# Patient Record
Sex: Female | Born: 1950 | Race: White | Hispanic: No | Smoking: Former smoker
Health system: Southern US, Community
[De-identification: ages and names within clinical notes are randomized; demographics above are authoritative.]

## PROBLEM LIST (undated history)

## (undated) DIAGNOSIS — E785 Hyperlipidemia, unspecified: Secondary | ICD-10-CM

## (undated) DIAGNOSIS — E119 Type 2 diabetes mellitus without complications: Secondary | ICD-10-CM

## (undated) DIAGNOSIS — I429 Cardiomyopathy, unspecified: Secondary | ICD-10-CM

## (undated) DIAGNOSIS — N200 Calculus of kidney: Secondary | ICD-10-CM

## (undated) DIAGNOSIS — I4891 Unspecified atrial fibrillation: Secondary | ICD-10-CM

## (undated) DIAGNOSIS — M1990 Unspecified osteoarthritis, unspecified site: Secondary | ICD-10-CM

## (undated) DIAGNOSIS — C801 Malignant (primary) neoplasm, unspecified: Secondary | ICD-10-CM

## (undated) DIAGNOSIS — Z87442 Personal history of urinary calculi: Secondary | ICD-10-CM

## (undated) DIAGNOSIS — I2510 Atherosclerotic heart disease of native coronary artery without angina pectoris: Secondary | ICD-10-CM

## (undated) DIAGNOSIS — I10 Essential (primary) hypertension: Secondary | ICD-10-CM

## (undated) DIAGNOSIS — I509 Heart failure, unspecified: Secondary | ICD-10-CM

## (undated) HISTORY — DX: Hyperlipidemia, unspecified: E78.5

## (undated) HISTORY — PX: BREAST BIOPSY: SHX20

## (undated) HISTORY — PX: ABDOMINAL HYSTERECTOMY: SHX81

## (undated) HISTORY — DX: Essential (primary) hypertension: I10

## (undated) HISTORY — PX: TONSILLECTOMY: SUR1361

## (undated) HISTORY — PX: JOINT REPLACEMENT: SHX530

## (undated) HISTORY — DX: Type 2 diabetes mellitus without complications: E11.9

## (undated) HISTORY — DX: Calculus of kidney: N20.0

## (undated) HISTORY — DX: Heart failure, unspecified: I50.9

## (undated) HISTORY — PX: BREAST EXCISIONAL BIOPSY: SUR124

## (undated) HISTORY — PX: APPENDECTOMY: SHX54

## (undated) HISTORY — DX: Atherosclerotic heart disease of native coronary artery without angina pectoris: I25.10

## (undated) HISTORY — PX: REPLACEMENT TOTAL KNEE BILATERAL: SUR1225

---

## 2007-11-09 ENCOUNTER — Ambulatory Visit: Payer: Self-pay | Admitting: Family Medicine

## 2008-05-13 ENCOUNTER — Ambulatory Visit: Payer: Self-pay | Admitting: Family Medicine

## 2009-04-11 ENCOUNTER — Ambulatory Visit: Payer: Self-pay | Admitting: Unknown Physician Specialty

## 2009-04-11 IMAGING — CR DG CHEST 2V
1 series · 2 of 2 positions shown · non-contrast
Comparison: none

REASON FOR EXAM: prior tobacco use
COMMENTS:

PROCEDURE:     DXR - DXR CHEST PA (OR AP) AND LATERAL  - [DATE] [DATE]
RESULT:     The lung fields are clear. The heart, mediastinal and osseous
structures reveal no significant abnormalities.

[Series 1: view not recorded · 0.17mm/px · 2 of 2 slices shown]
[im 1/2]
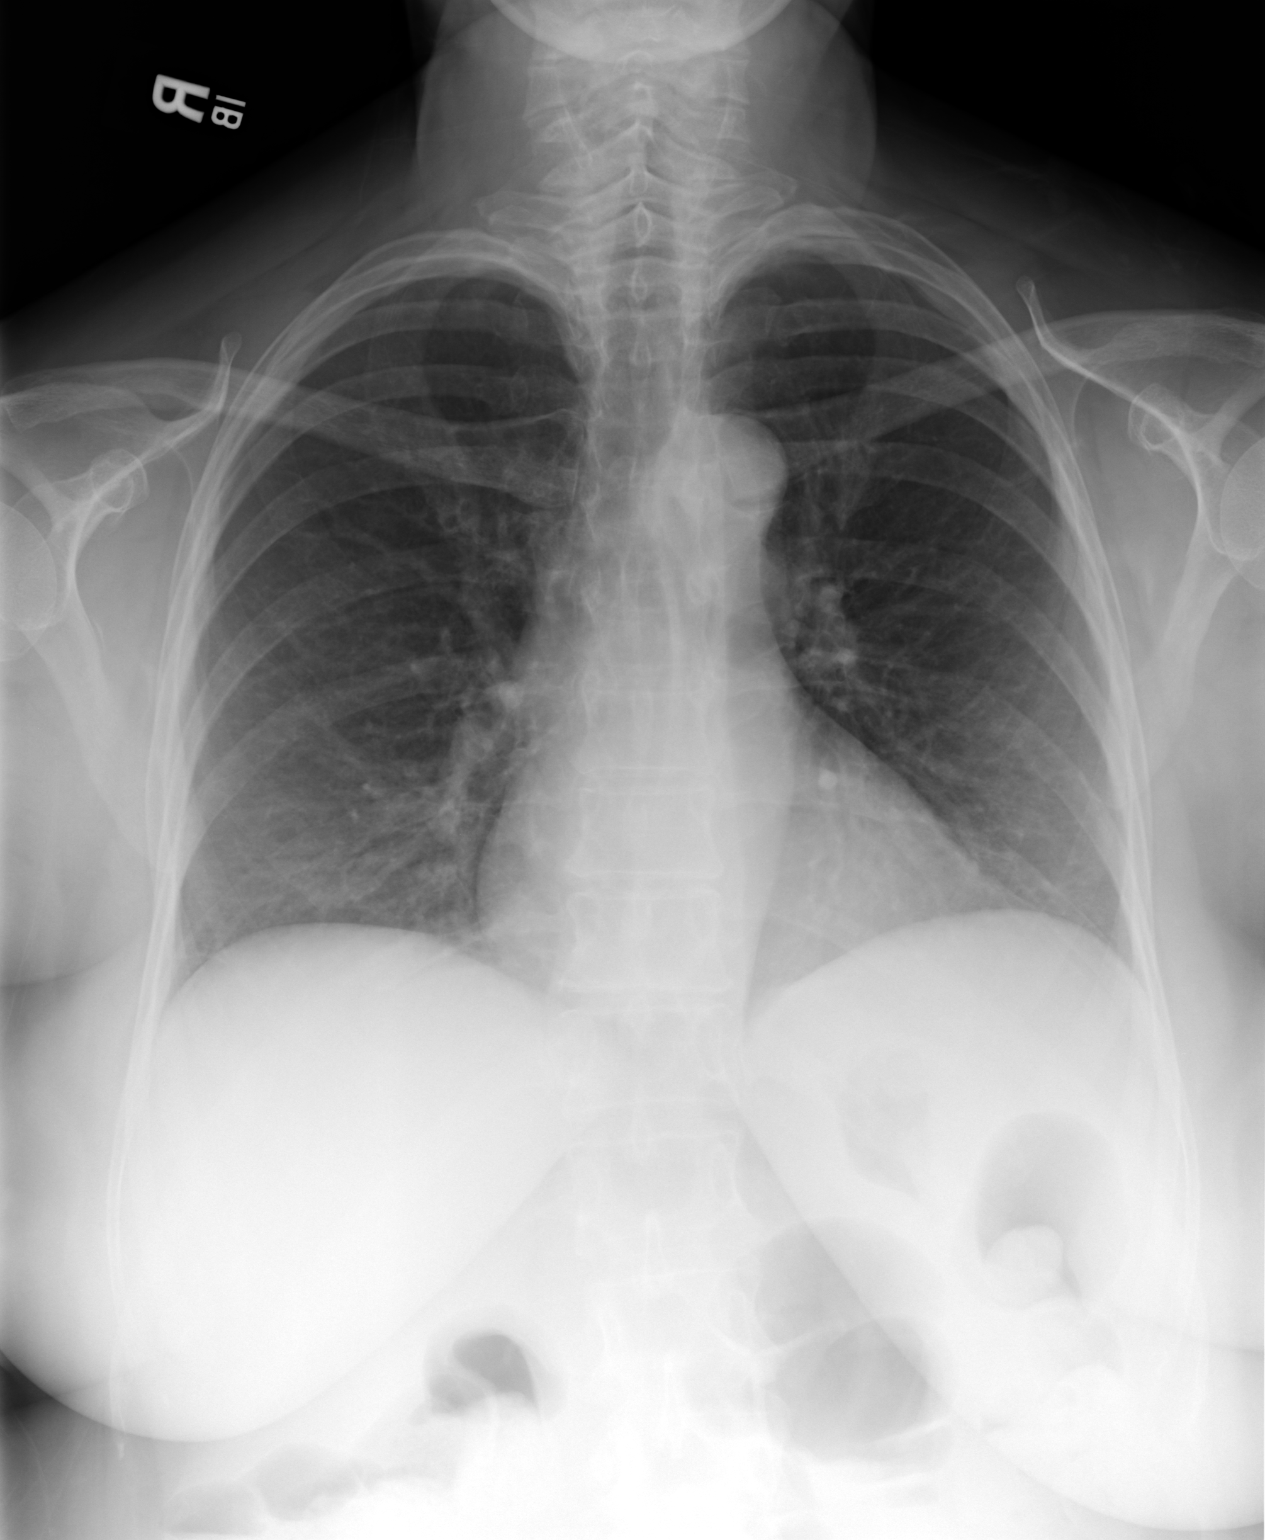
[im 2/2]
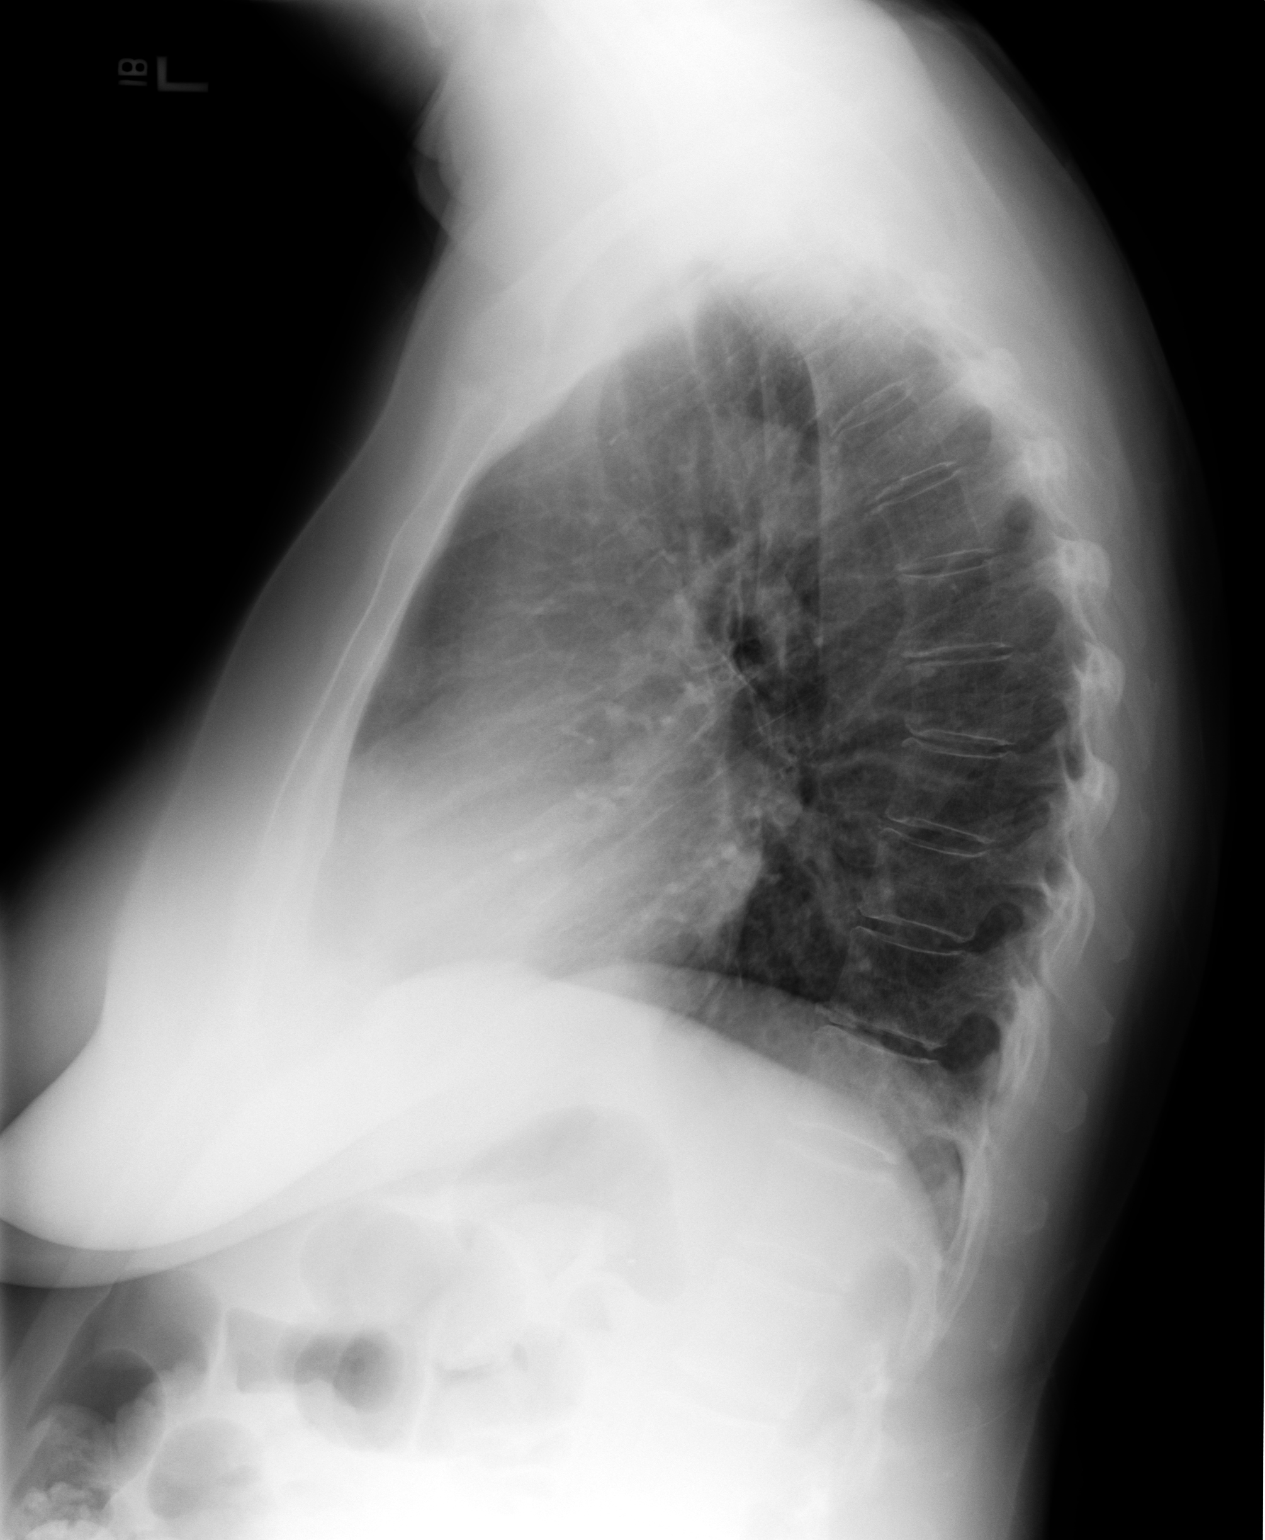

[2 of 2 positions shown; findings below may reference images not displayed]

IMPRESSION: No significant abnormalities are noted.

## 2009-04-17 ENCOUNTER — Inpatient Hospital Stay: Payer: Self-pay | Admitting: Unknown Physician Specialty

## 2009-04-17 IMAGING — CR DG KNEE 1-2V*L*
1 series · 2 of 2 positions shown · non-contrast
Comparison: none

REASON FOR EXAM: post op TKR
COMMENTS:   Bedside (portable):Y

PROCEDURE:     DXR - DXR KNEE LEFT AP AND LATERAL  - [DATE] [DATE]
RESULT:     The patient is status post total left knee replacement. No
fracture about the prosthetic components is seen. There is no dislocation at
the prosthetic knee joint. Surgical drains are noted.

[Series 1: view not recorded · 0.17mm/px · 2 of 2 slices shown]
[im 1/2]
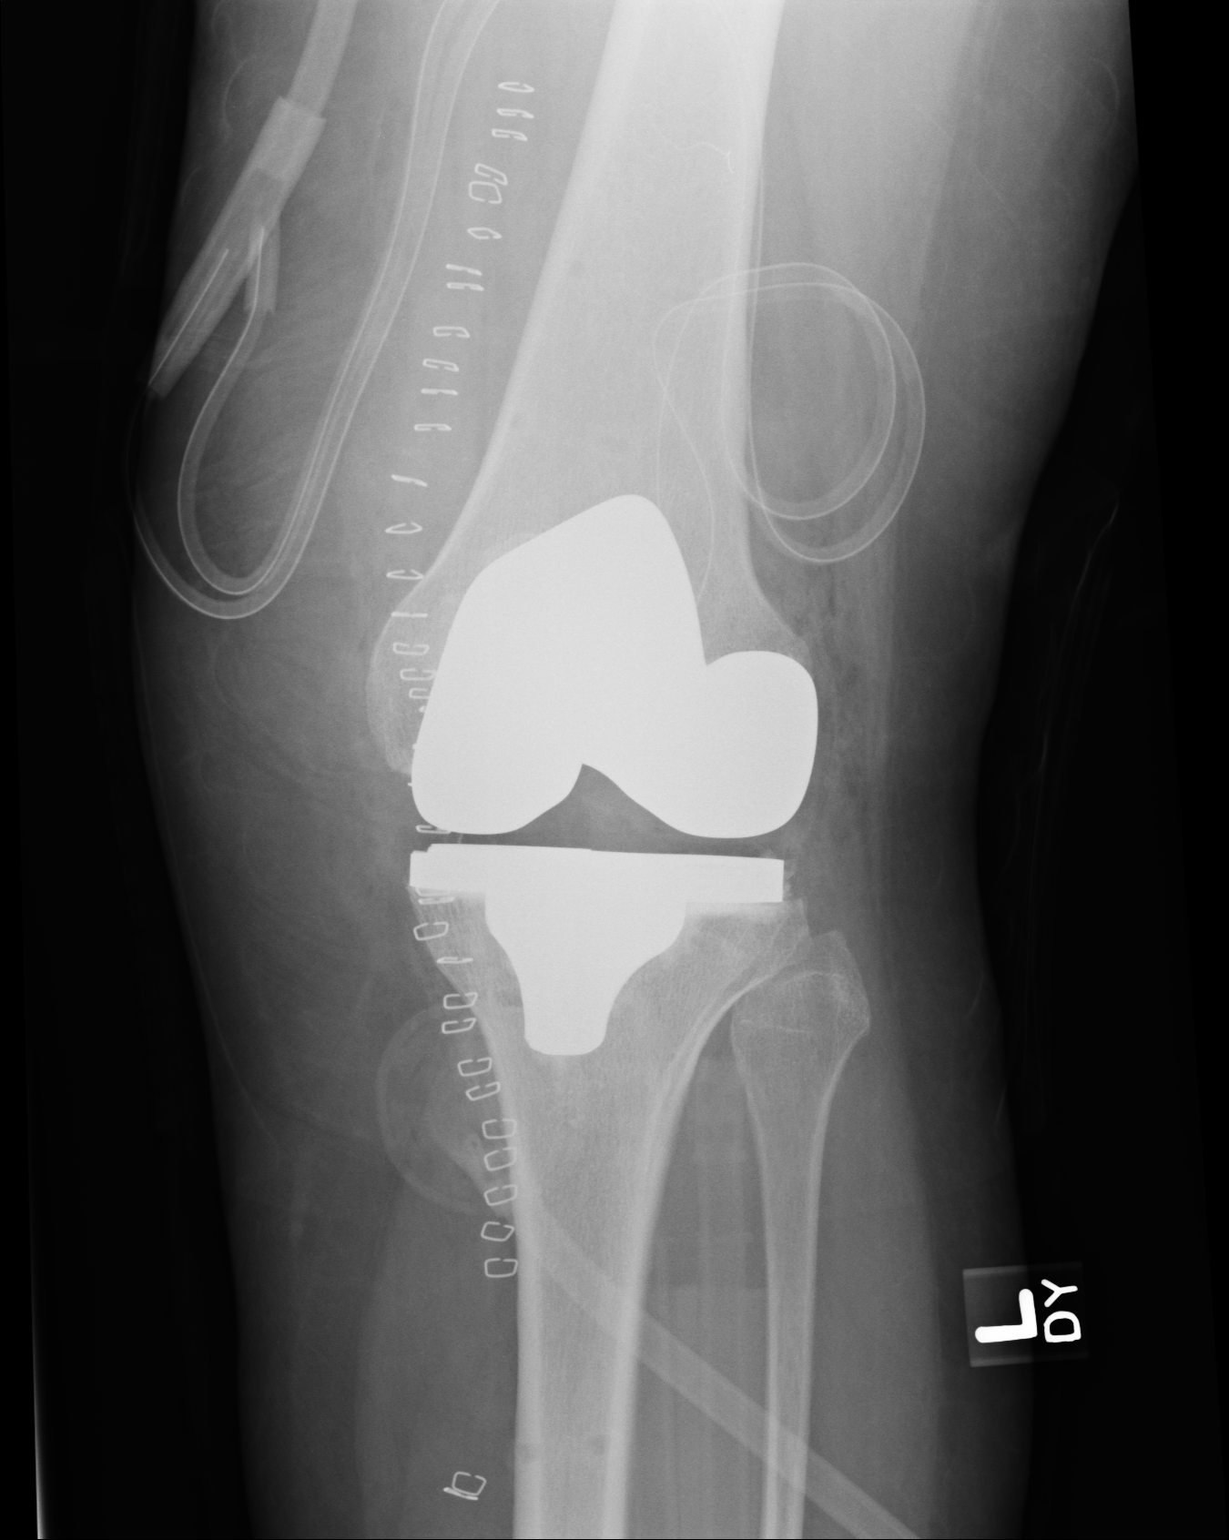
[im 2/2]
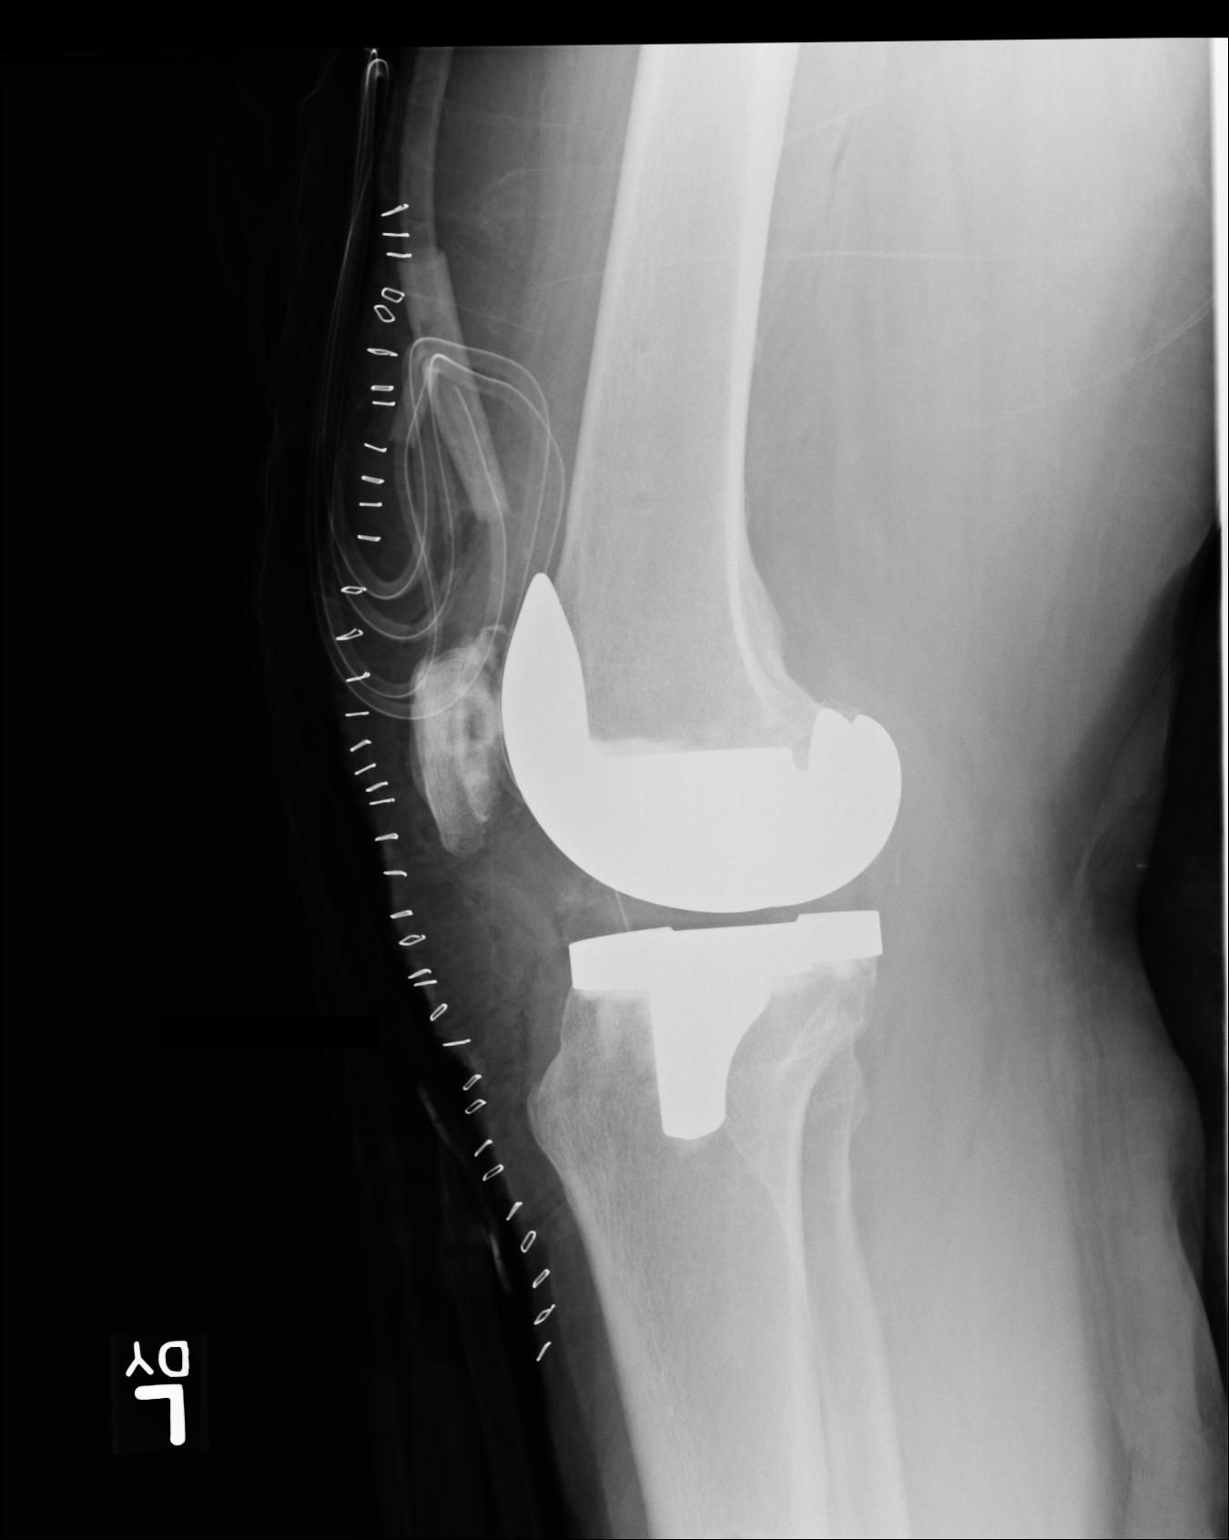

[2 of 2 positions shown; findings below may reference images not displayed]

IMPRESSION: 1.     The patient is status post left knee replacement. No abnormal
postoperative changes are identified.

## 2009-06-24 ENCOUNTER — Ambulatory Visit: Payer: Self-pay | Admitting: Family Medicine

## 2009-07-17 ENCOUNTER — Ambulatory Visit: Payer: Self-pay | Admitting: Unknown Physician Specialty

## 2009-07-17 IMAGING — CR DG CHEST 2V
1 series · 2 of 2 positions shown · non-contrast
Comparison: none

REASON FOR EXAM: previous smoker
COMMENTS:

[Series 1: view not recorded · 0.17mm/px · 2 of 2 slices shown]
[im 1/2]
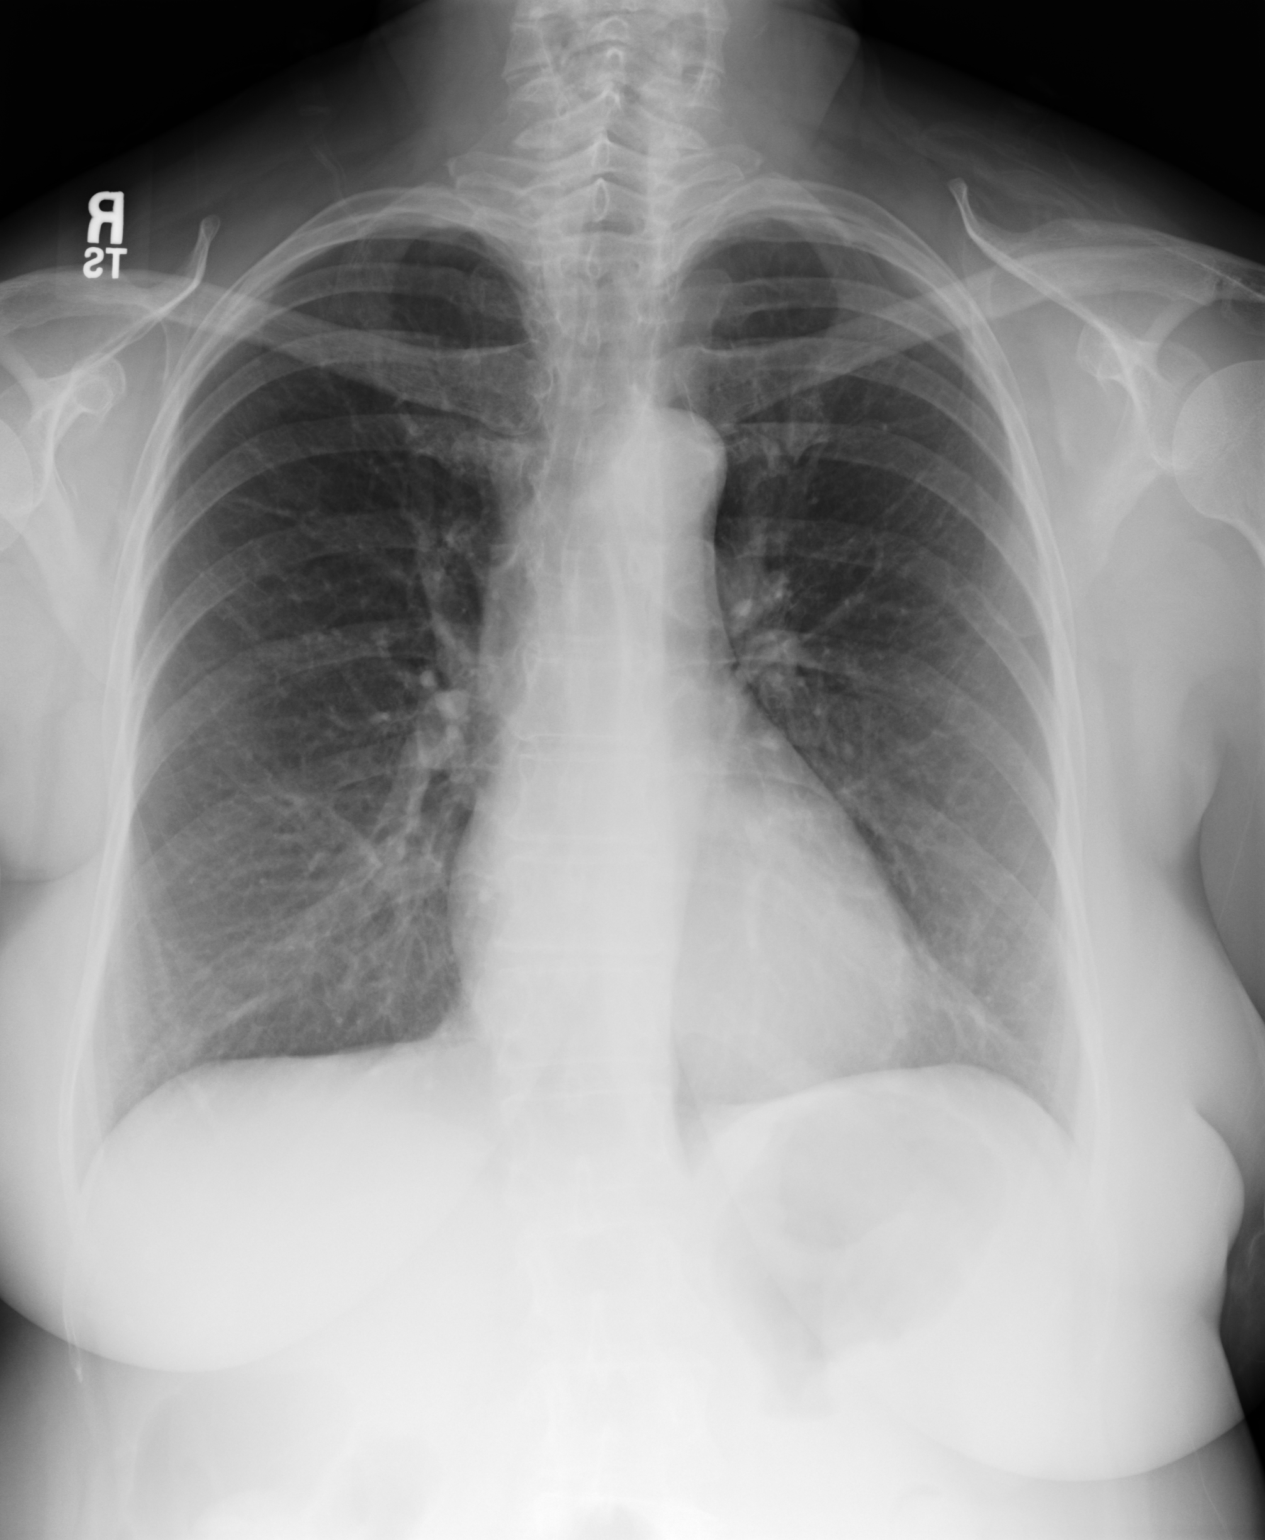
[im 2/2]
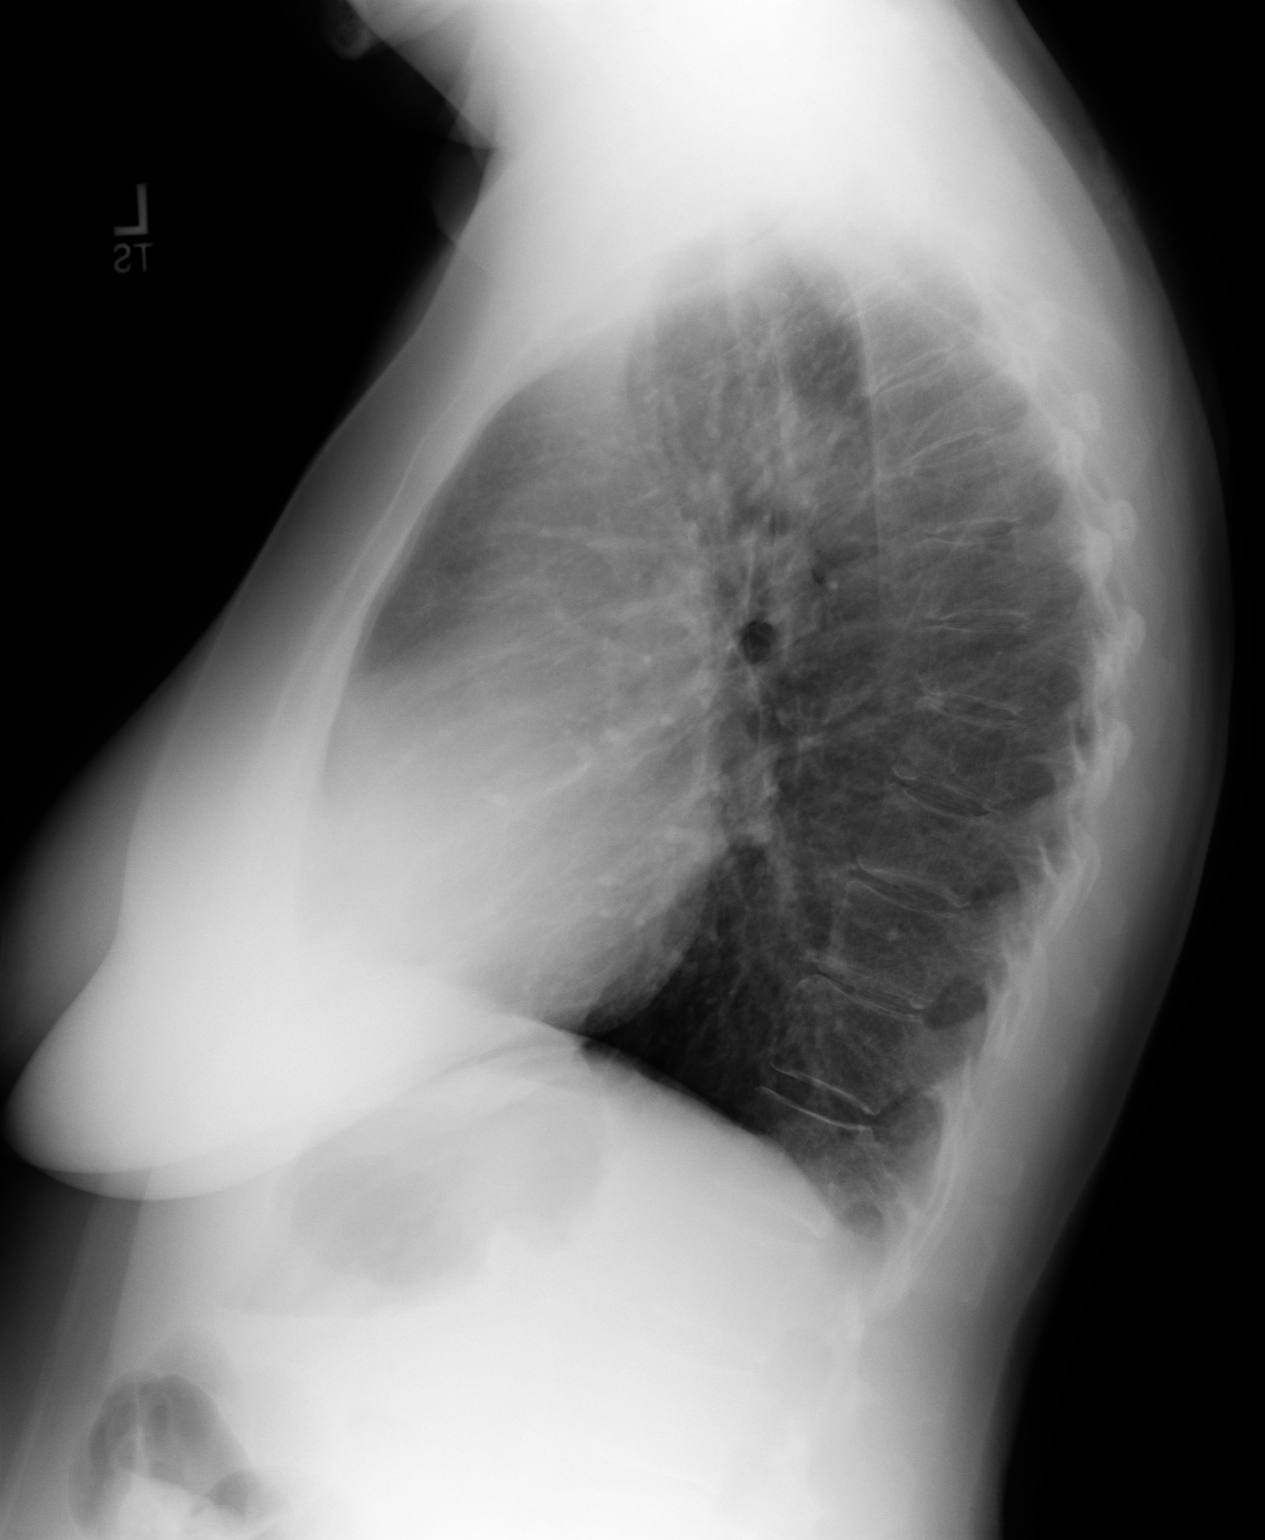

[2 of 2 positions shown; findings below may reference images not displayed]

PROCEDURE:     DXR - DXR CHEST PA (OR AP) AND LATERAL  - [DATE] [DATE]

RESULT:     Comparison made to study [DATE].

The lungs are mildly hyperinflated. The heart is normal in size. The
pulmonary vascularity is not engorged. There is no pleural effusion. No
acute abnormality of the bony thorax is seen.
IMPRESSION: There is mild hyperinflation with hemidiaphragm flattening
that may be in part voluntary or could reflect an element of underlying
COPD. There is no evidence of CHF nor pneumonia nor other acute
cardiopulmonary abnormality.

## 2009-07-22 ENCOUNTER — Inpatient Hospital Stay: Payer: Self-pay | Admitting: Unknown Physician Specialty

## 2009-07-22 IMAGING — CR DG KNEE 1-2V*R*
1 series · 2 of 2 positions shown · non-contrast
Comparison: none

REASON FOR EXAM: post op TKR
COMMENTS:   Bedside (portable):Y

[Series 1: view not recorded · 0.17mm/px · 2 of 2 slices shown]
[im 1/2]
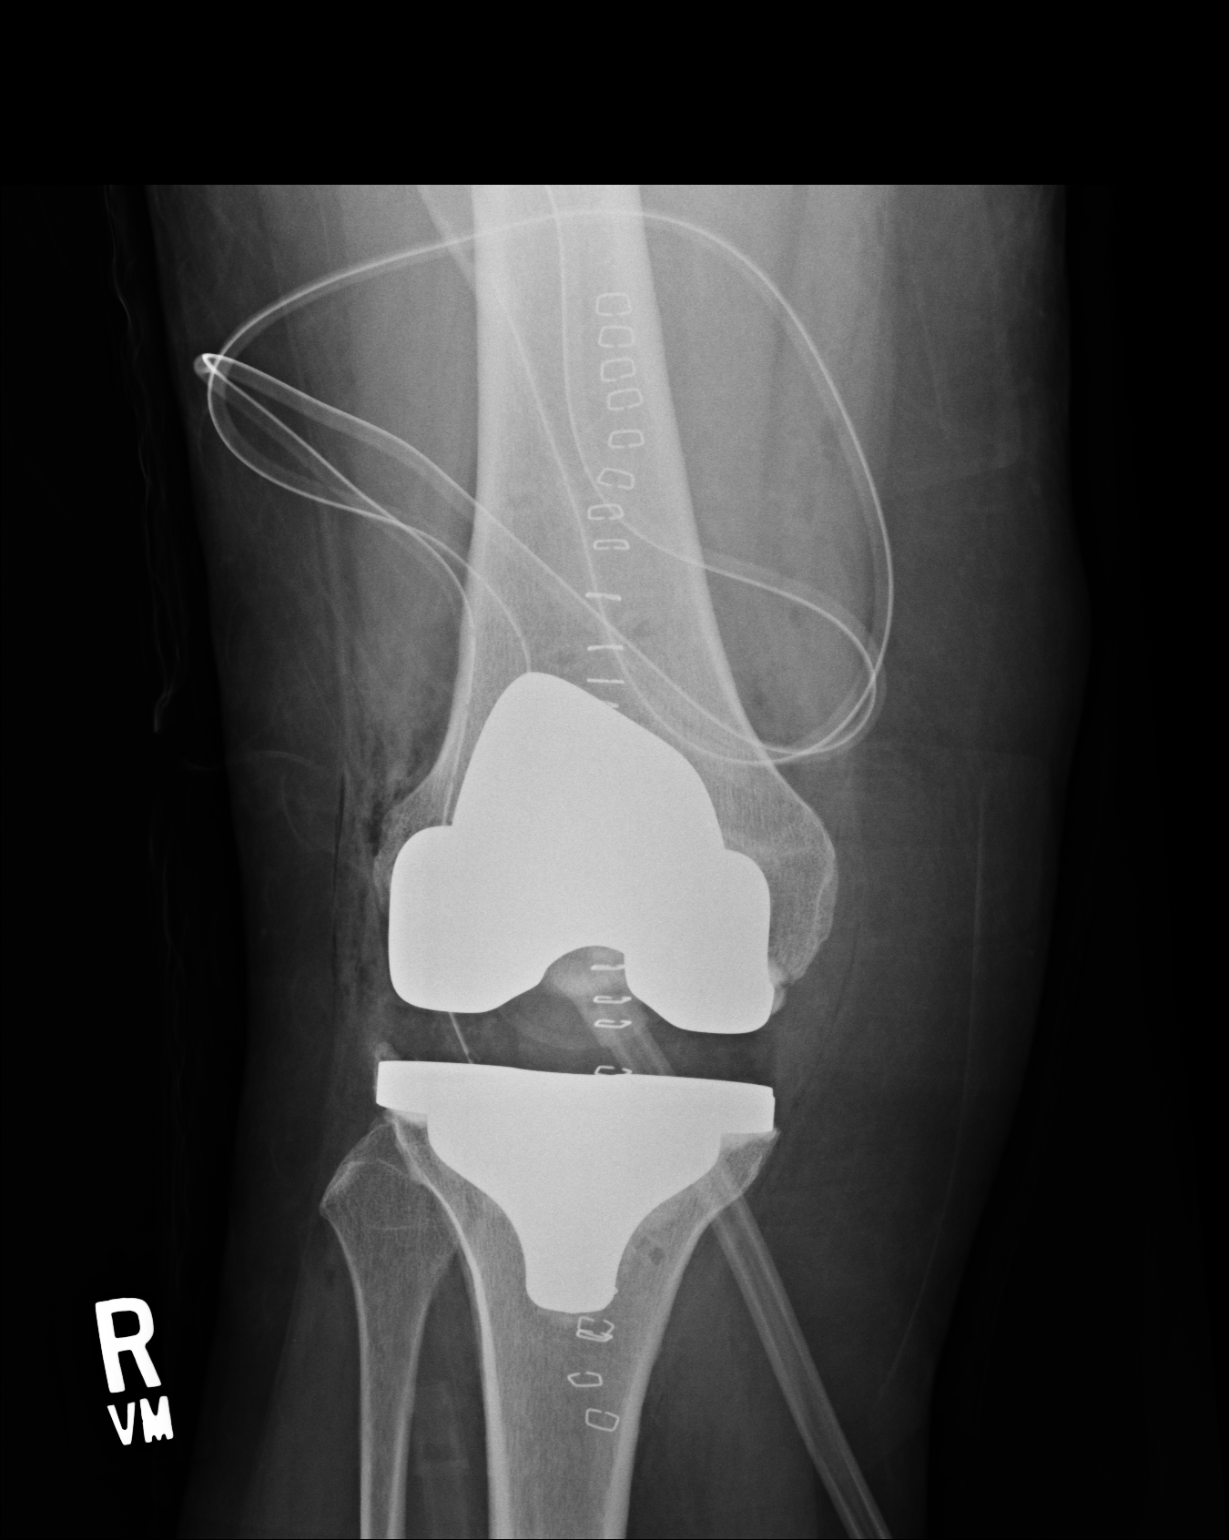
[im 2/2]
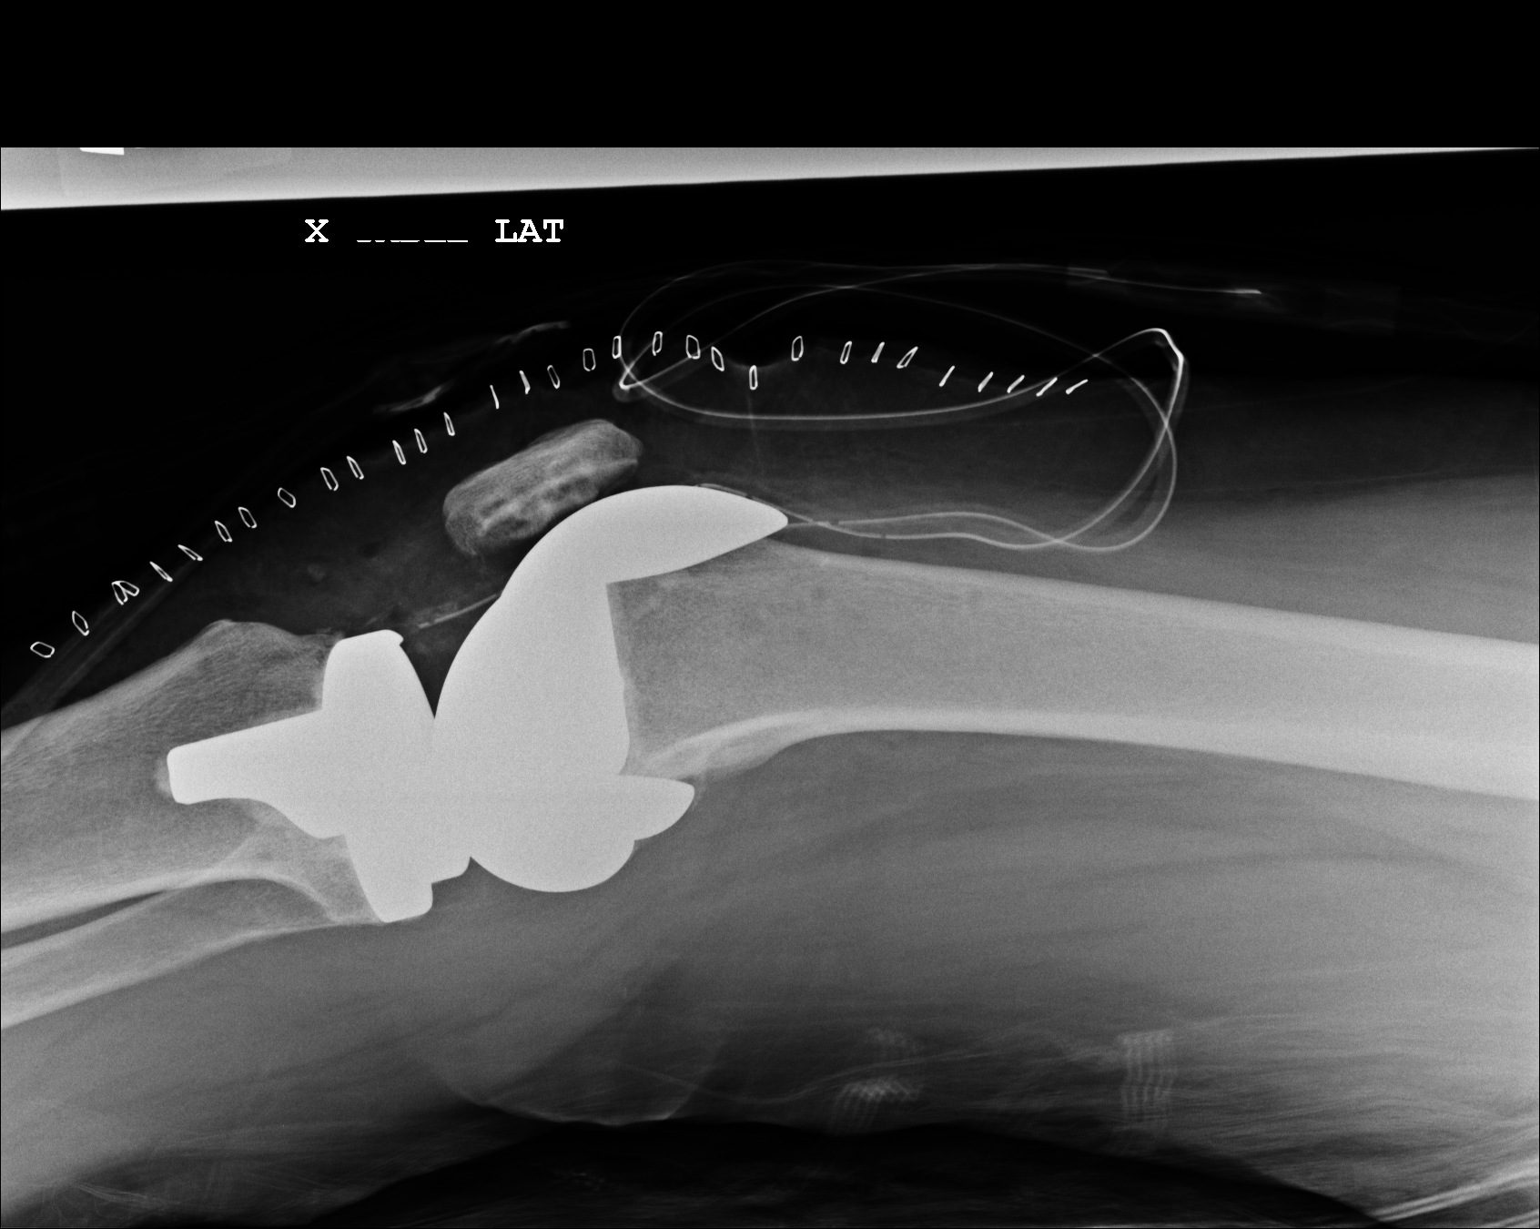

[2 of 2 positions shown; findings below may reference images not displayed]

PROCEDURE:     DXR - DXR KNEE RIGHT AP AND LATERAL  - [DATE]  [DATE]

RESULT:     Two views of the right knee were obtained.

The patient is status post right knee replacement. No fracture about the
prosthetic components is seen. There is no dislocation at the prosthetic
knee joint. Surgical drains are present anteriorly.
IMPRESSION: The patient is status post right knee replacement. No
abnormal post operative changes are identified.

## 2010-04-15 ENCOUNTER — Ambulatory Visit: Payer: Self-pay | Admitting: Family Medicine

## 2011-08-18 ENCOUNTER — Ambulatory Visit: Payer: Self-pay | Admitting: Family Medicine

## 2011-08-18 IMAGING — MG MM CAD SCREENING MAMMO
1 series · 4 of 4 positions shown · non-contrast
Comparison: none

REASON FOR EXAM: scr mammo no order
COMMENTS:

[R CC · right · 4 of 4 slices shown]
[im 1/4]
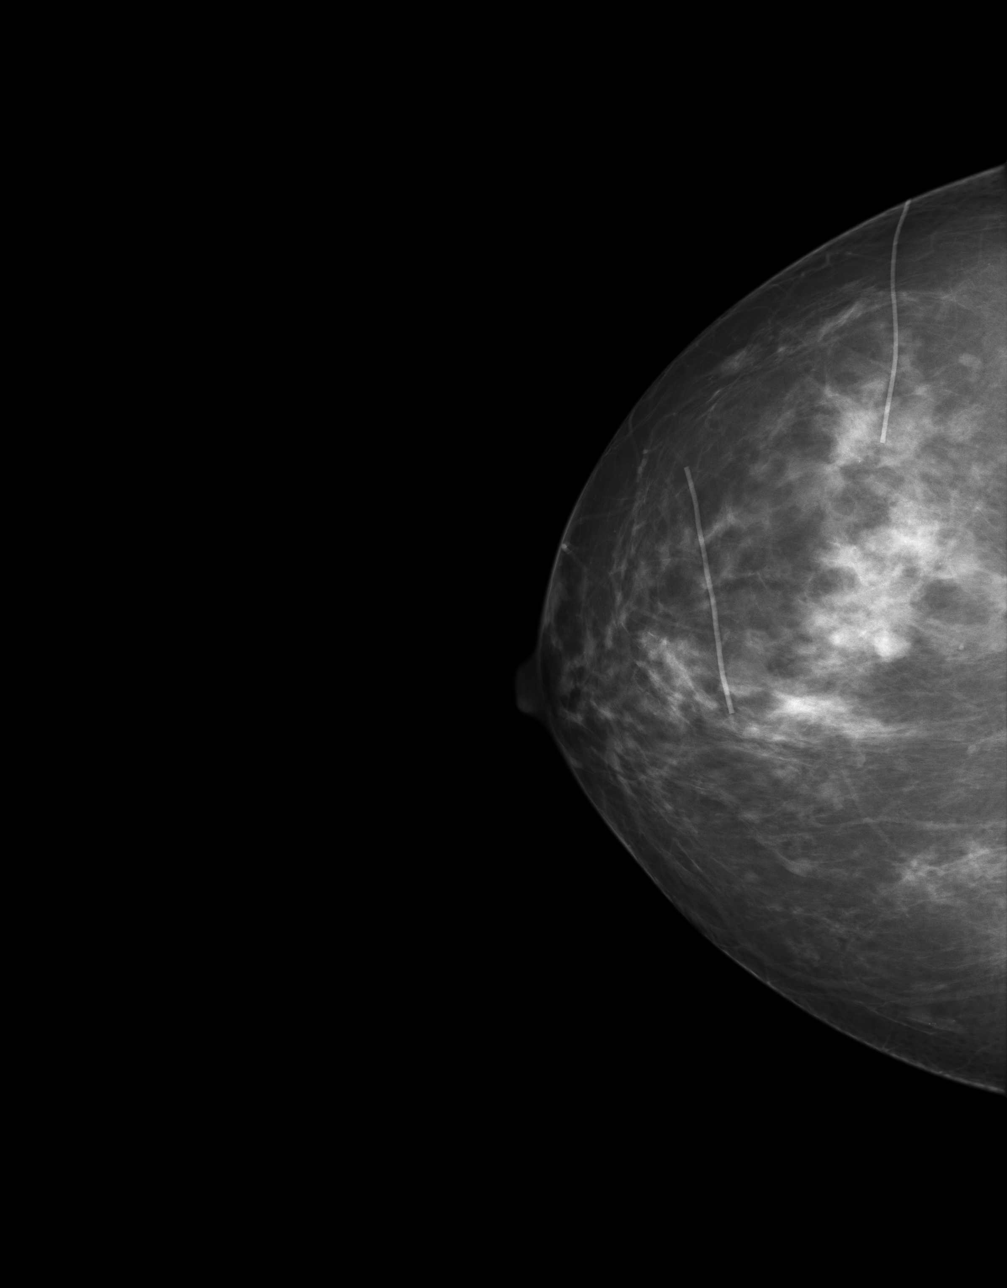
[im 2/4]
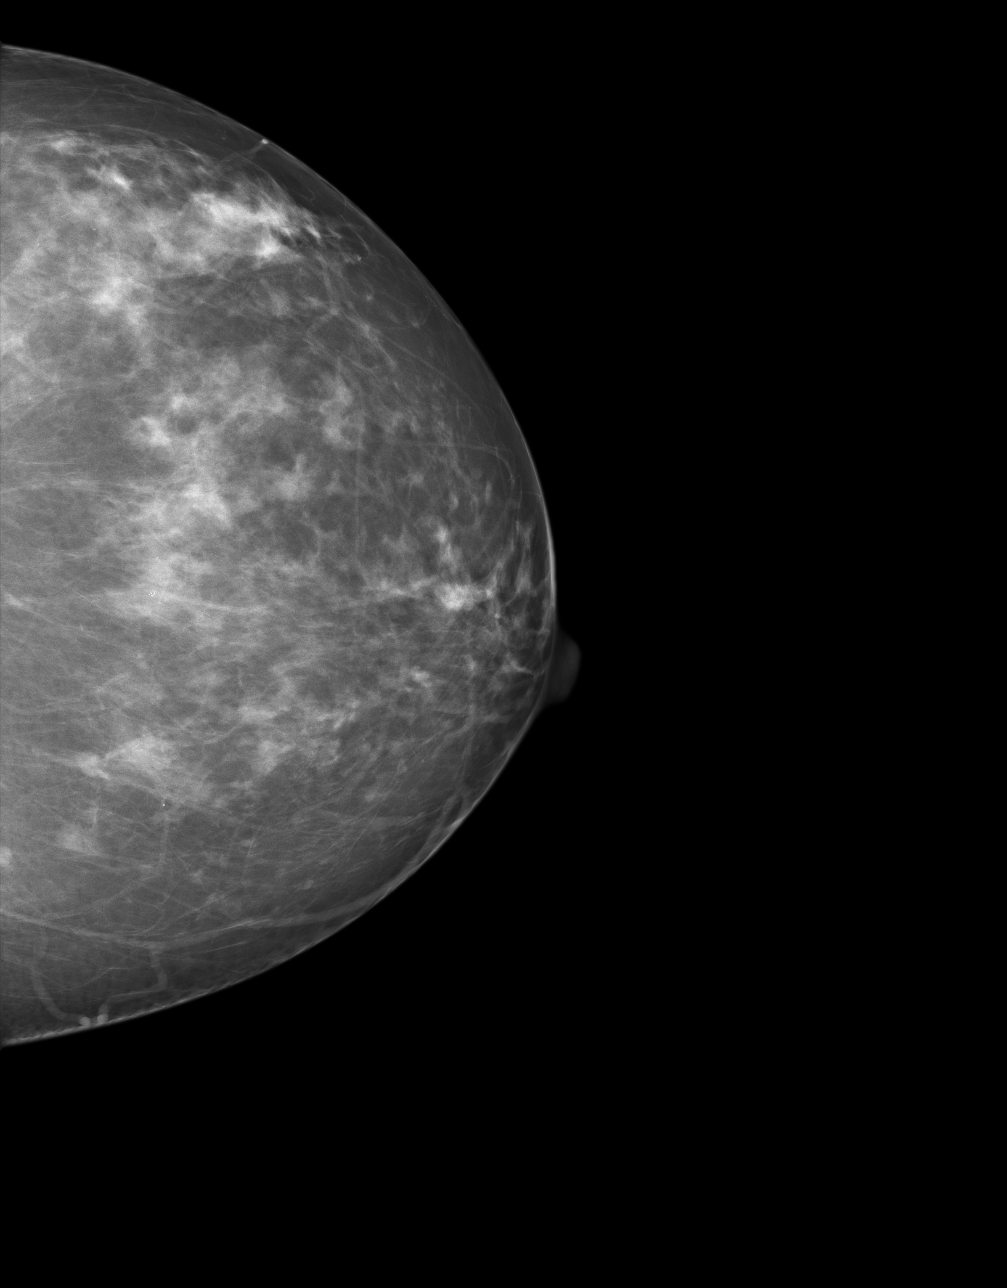
[im 3/4]
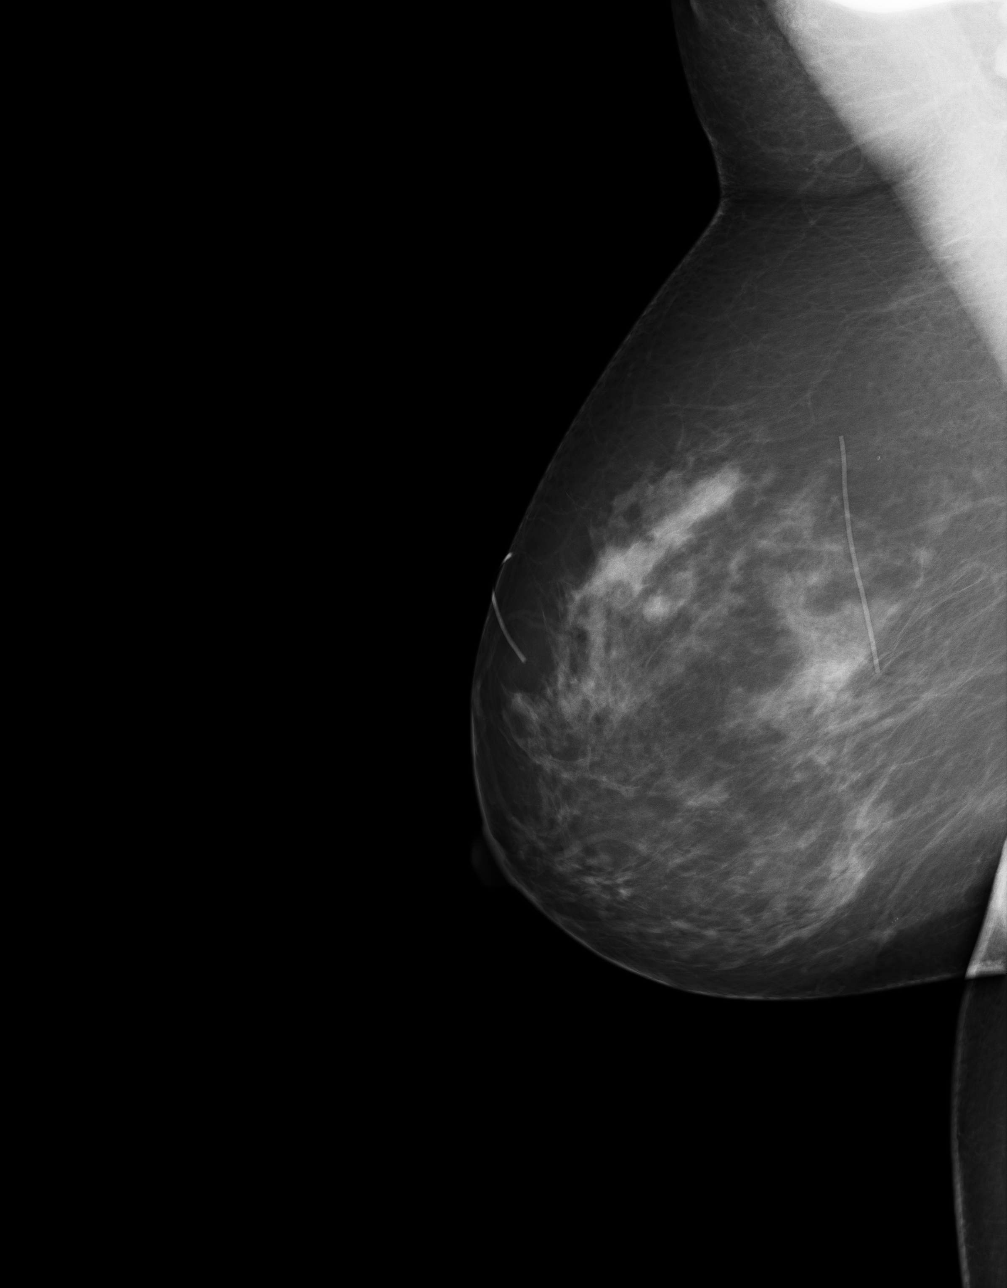
[im 4/4]
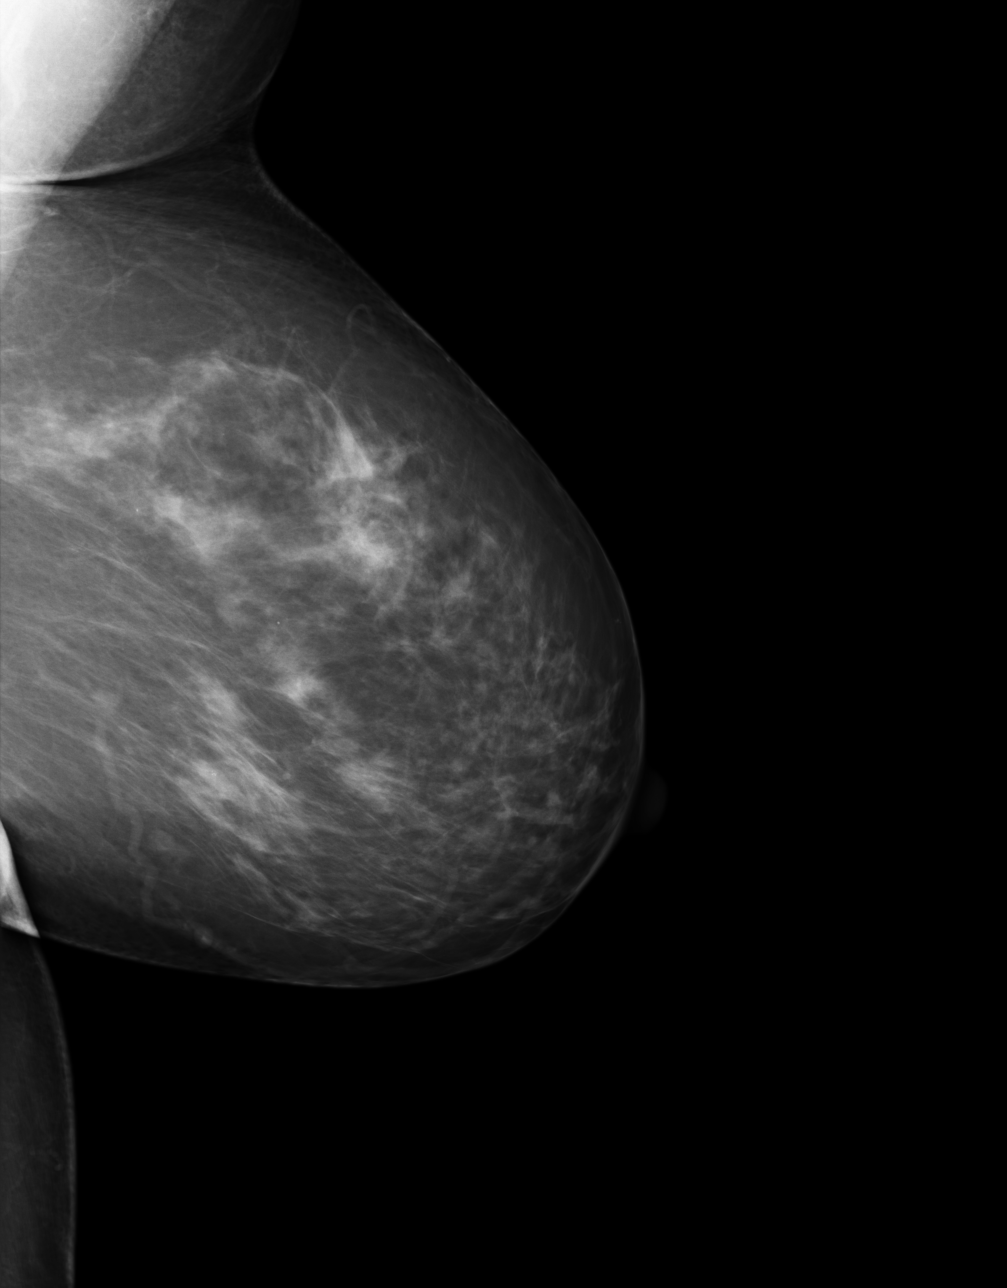

[4 of 4 positions shown; findings below may reference images not displayed]

PROCEDURE:     MAM - MAM DGTL SCRN MAM NO ORDER W/CAD  - [DATE]  [DATE]

RESULT:      Comparison is made to a study of [DATE] as well as
[DATE] and [DATE].

The breasts exhibit a heterogeously dense nodular parenchymal pattern.
Surgical scar markers have been placed on the right.  There is no dominant
mass. There are no malignant-appearing groupings of microcalcification.
IMPRESSION: 1.      The breasts are dense and nodular in character.  I do not see
findings suspicious for malignancy.
2.     BI-RADS 2:  Benign Findings.

RECOMMENTATION:   Please continue to encourage yearly mammographic follow
up.

A NEGATIVE MAMMOGRAM REPORT DOES NOT PRECLUDE BIOPSY OR OTHER EVALUATION OF
A CLINICALLY PALPABLE OR OTHERWISE SUSPICOUS MASS OR LESION.  BREAST CANCER
MAY NOT BE DETECTED BY MAMMOGRAPHY IN UP TO 10% OF CASES.

## 2012-11-01 ENCOUNTER — Ambulatory Visit: Payer: Self-pay | Admitting: Family Medicine

## 2012-11-01 IMAGING — MG MM CAD SCREENING MAMMO
1 series · 5 of 5 positions shown · non-contrast
Comparison: none

REASON FOR EXAM: scr mammo no order
COMMENTS:

PROCEDURE:     MAM - MAM DGTL SCRN MAM NO ORDER W/CAD  - [DATE]  [DATE]
RESULT:     COMPARISON:  [DATE], [DATE], [DATE], [DATE]
TECHNIQUE: Digital screening mammograms were obtained. FDA approved
computer-aided detection (CAD) for mammography was utilized for this study.
BREAST COMPOSITION: The breast composition is HETEROGENEOUSLY DENSE
(glandular tissue is 51-75%). This may decrease the sensitivity of
mammography.

[R CC · right · 5 of 5 slices shown]
[im 1/5]
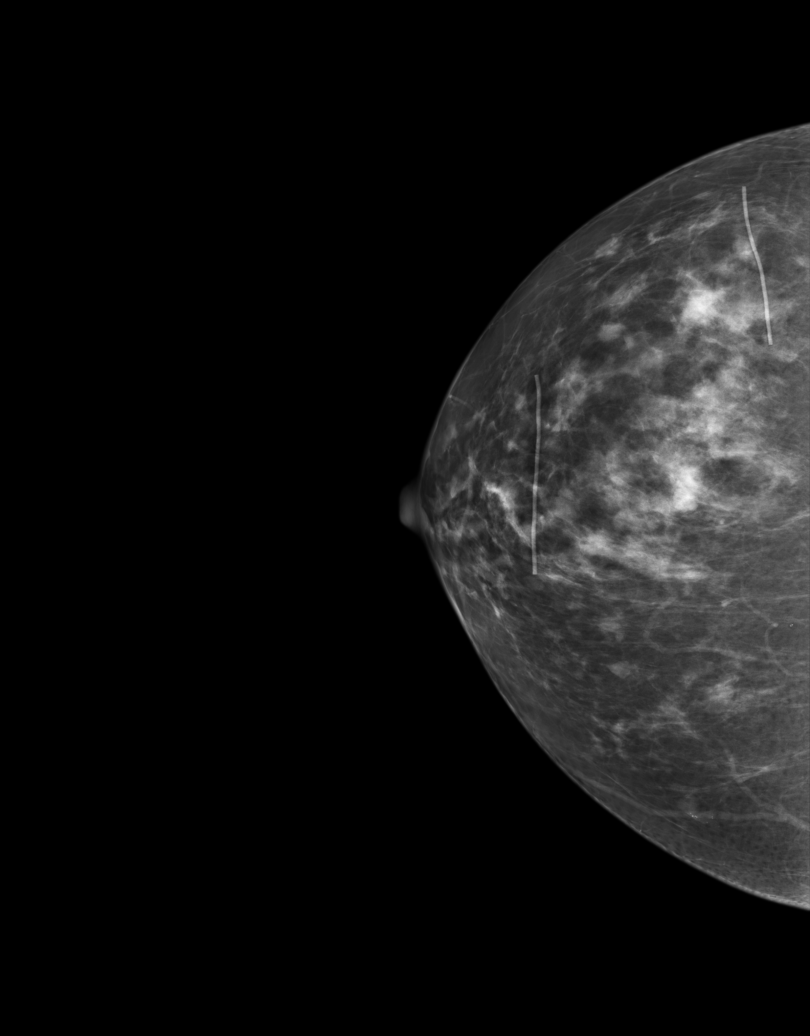
[im 2/5]
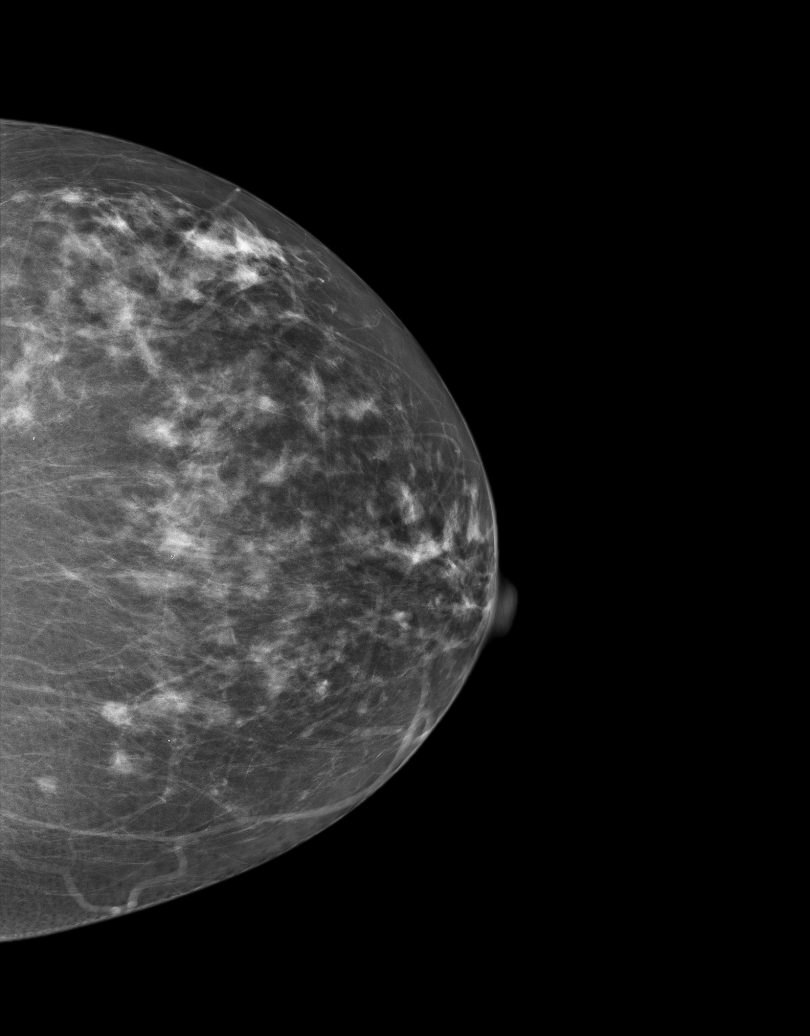
[im 3/5]
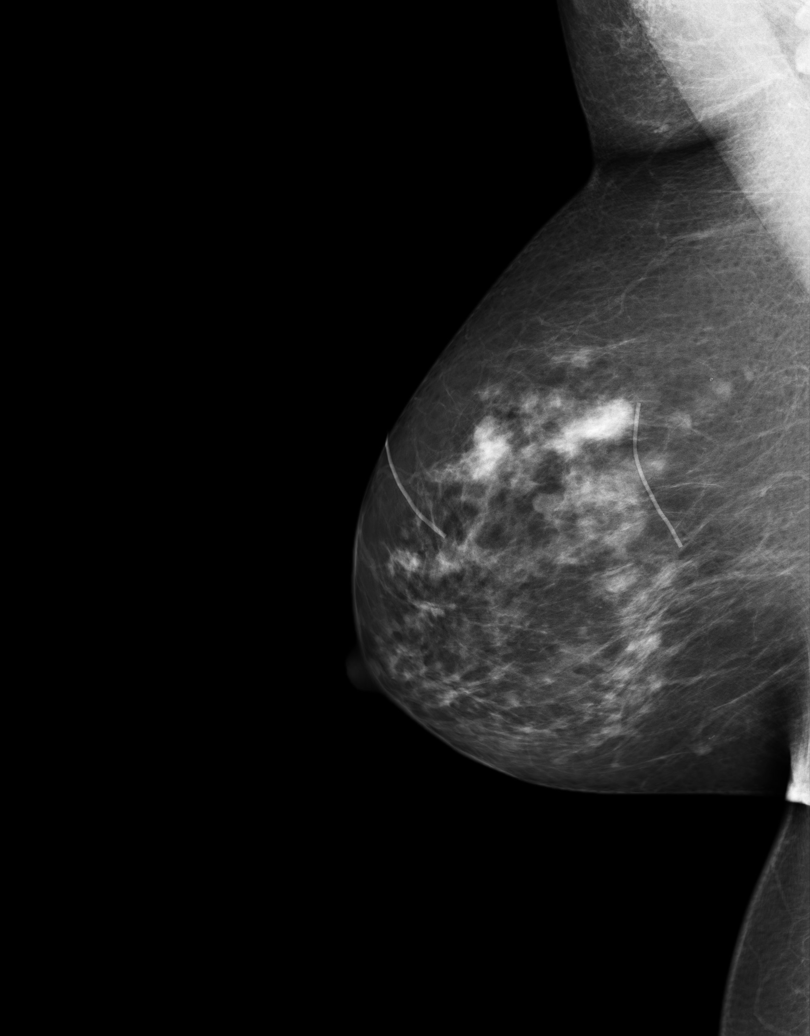
[im 4/5]
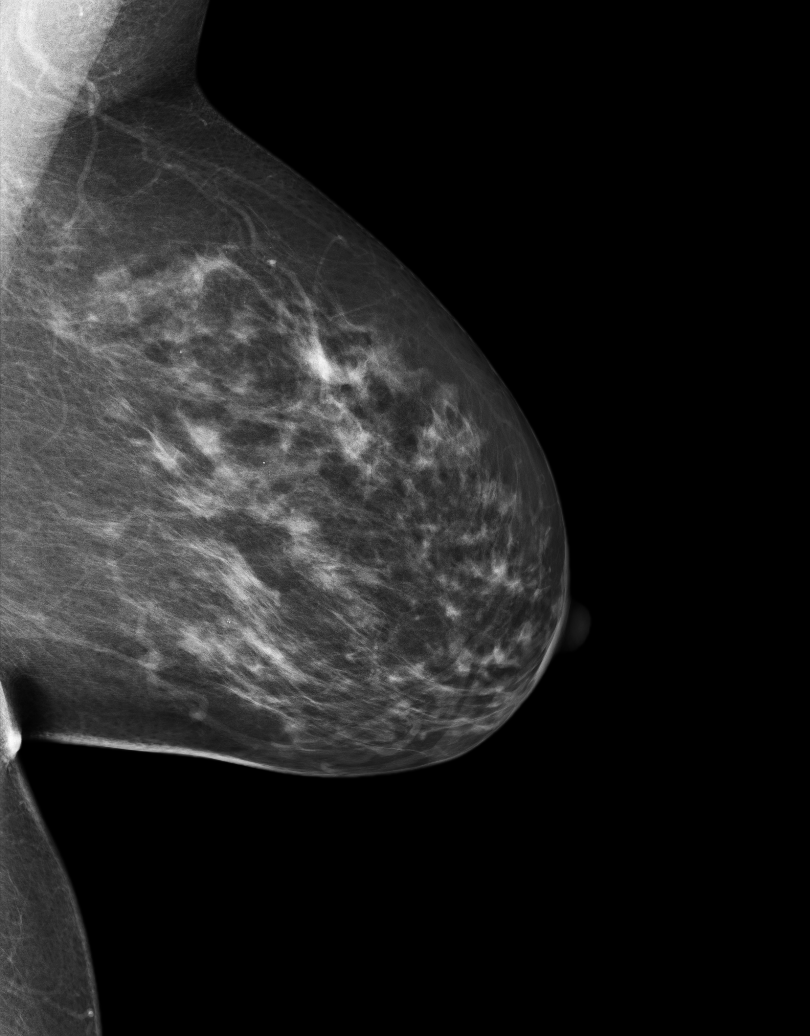
[im 5/5]
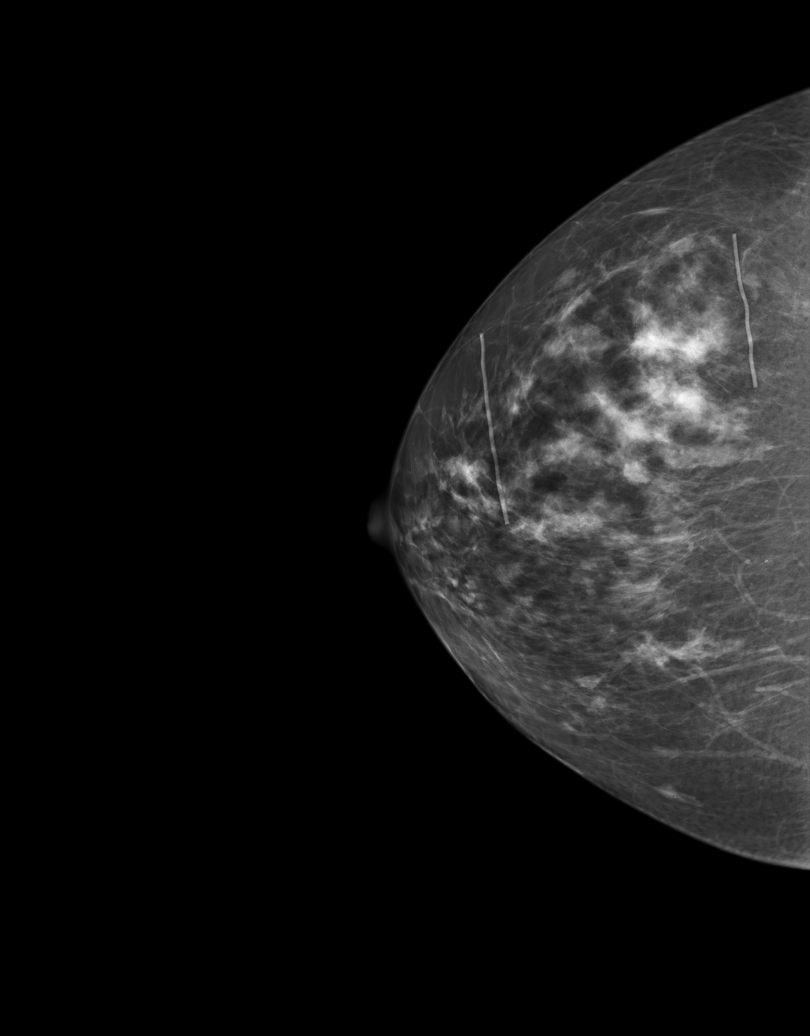

[5 of 5 positions shown; findings below may reference images not displayed]

FINDING: There is an asymmetry in the upper right breast in the region of the
surgical scar which is more conspicuous compared with prior examinations.
There is no dominant mass, architectural distortion or clusters of
suspicious microcalcifications.
IMPRESSION: 1.    Right upper breast asymmetry. Recommend spot compression views with
possible ultrasound to follow.

BI-RADS:  Category 0 - Needs Additional Imaging Evaluation

A negative mammogram report does not preclude biopsy or other evaluation of
a clinically palpable or otherwise suspicious mass or lesion. Breast cancer
may not be detected by mammography in up to 10% of cases.

[REDACTED]

## 2012-11-02 ENCOUNTER — Ambulatory Visit: Payer: Self-pay | Admitting: Family Medicine

## 2012-11-02 IMAGING — MG MM ADDITIONAL VIEWS AT NO CHARGE
1 series · 4 of 4 positions shown · non-contrast
Comparison: none

REASON FOR EXAM: av rt asymmetry
COMMENTS:

PROCEDURE:     MAM - MAM DIG ADDVIEWS RT SCR  - [DATE]  [DATE]
RESULT:     TECHNIQUE: Digital diagnostic right mammograms were obtained.
FDA approved computer-aided detection (CAD) for mammography was utilized for
this study.

[R ML · right · 4 of 4 slices shown]
[im 1/4]
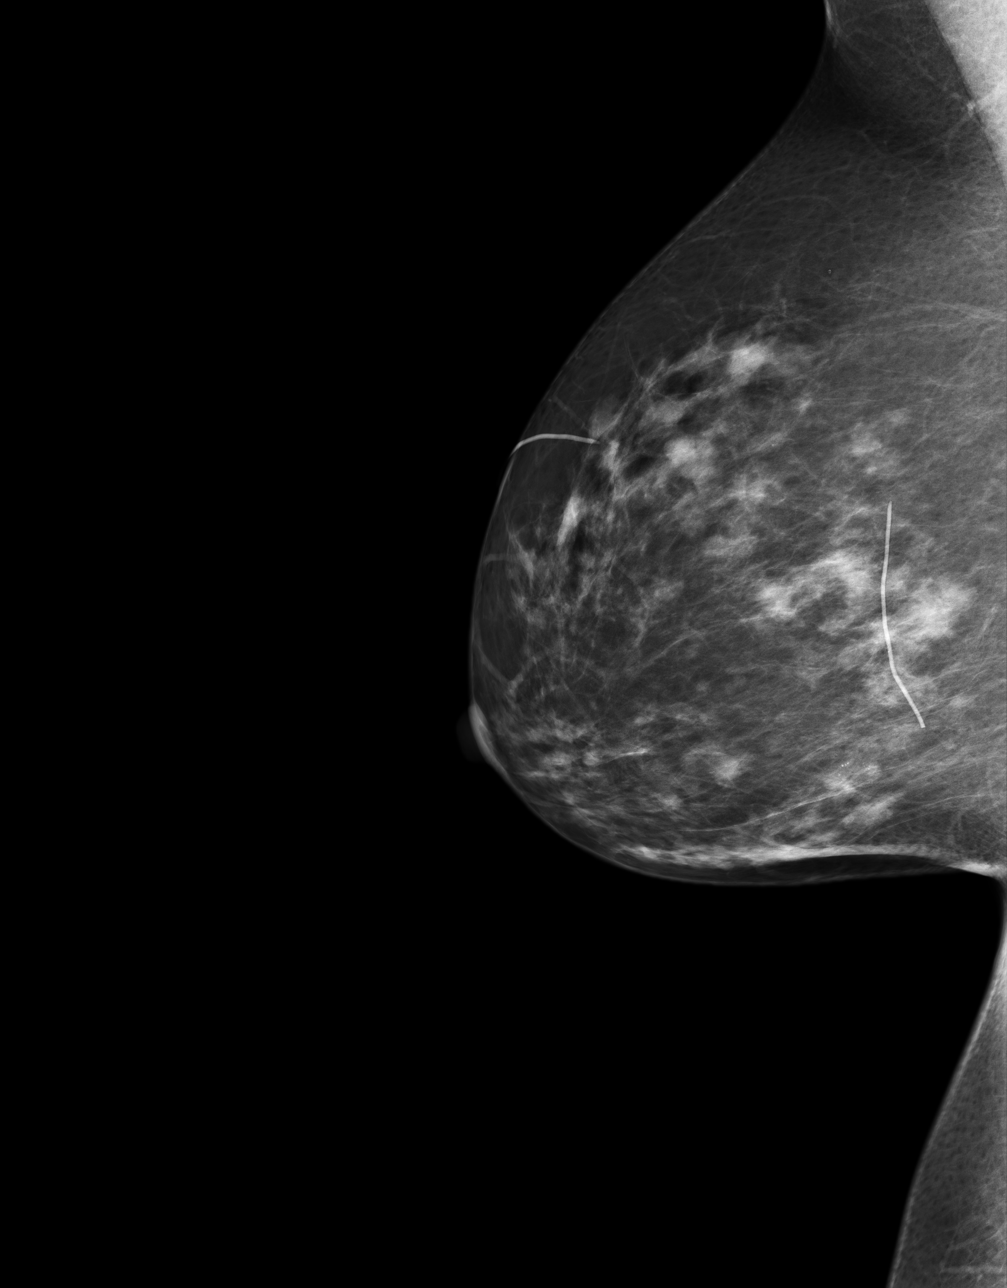
[im 2/4]
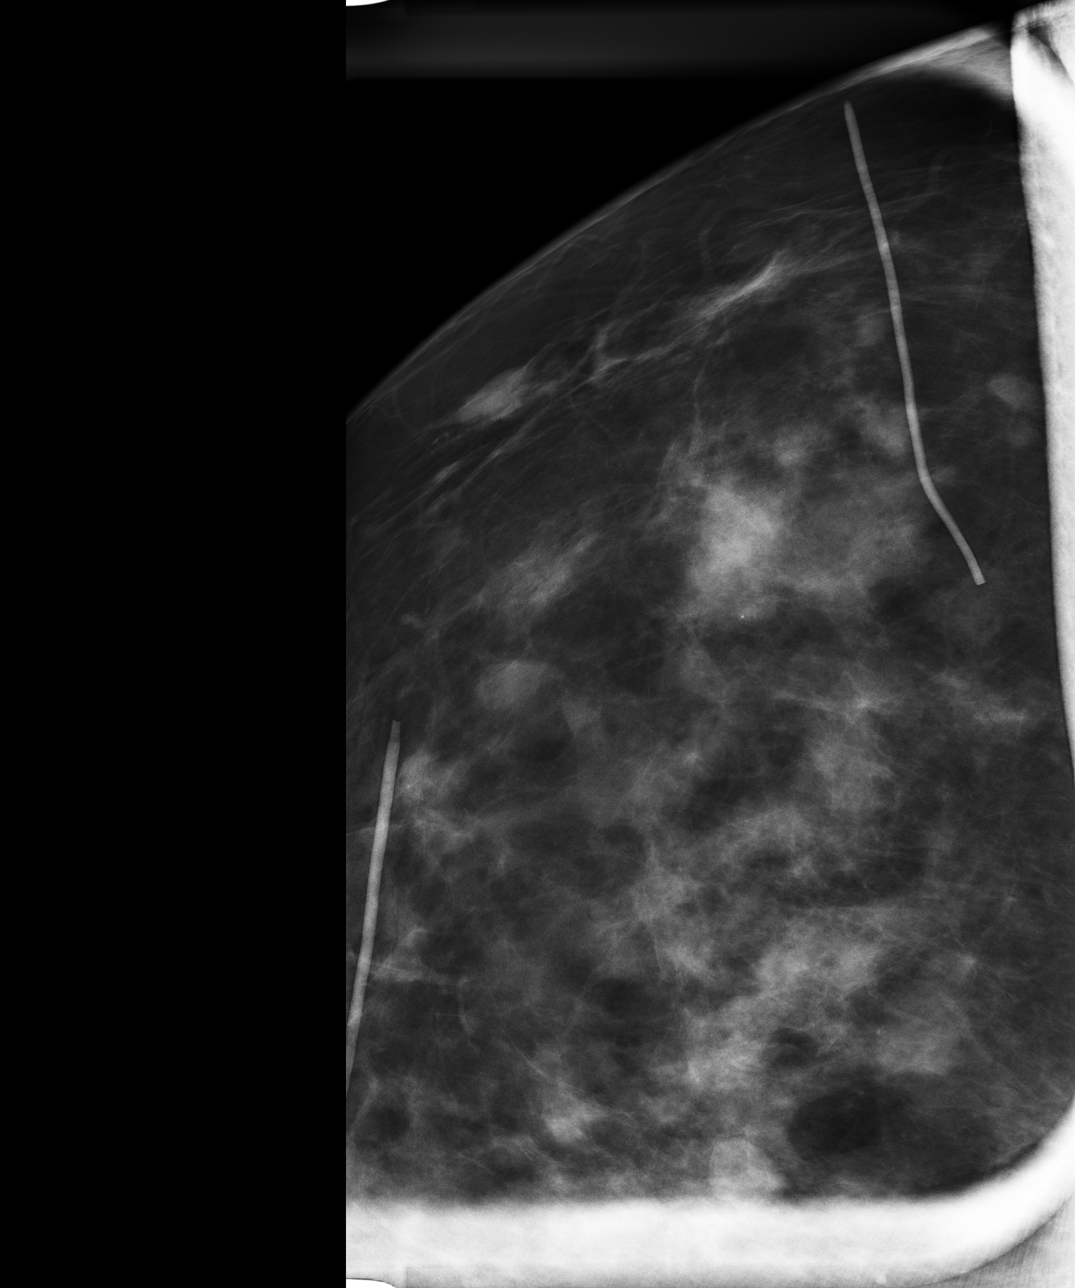
[im 3/4]
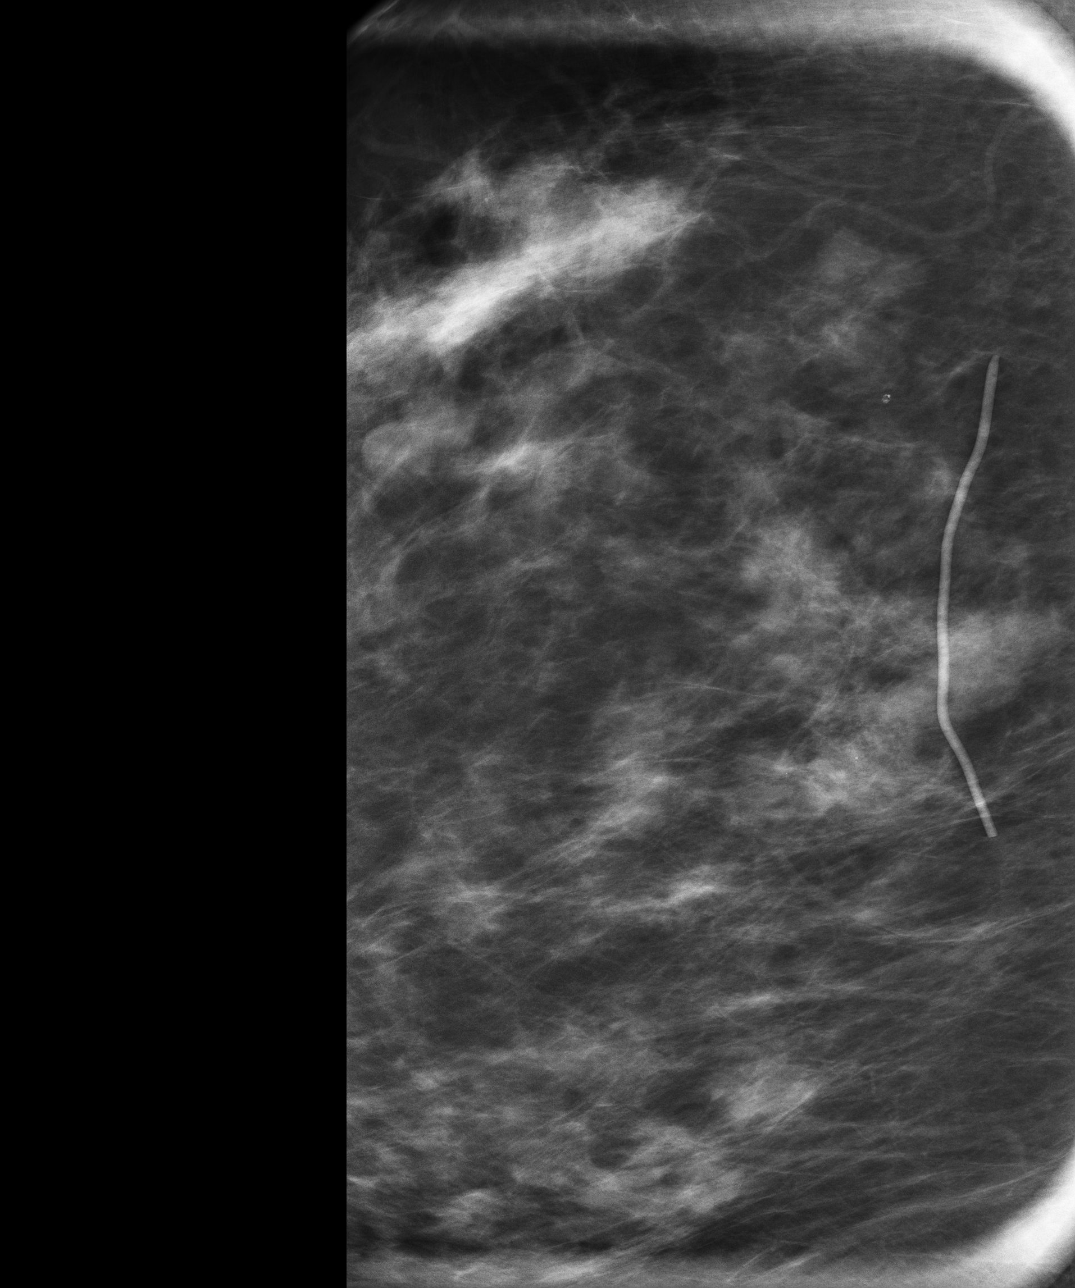
[im 4/4]
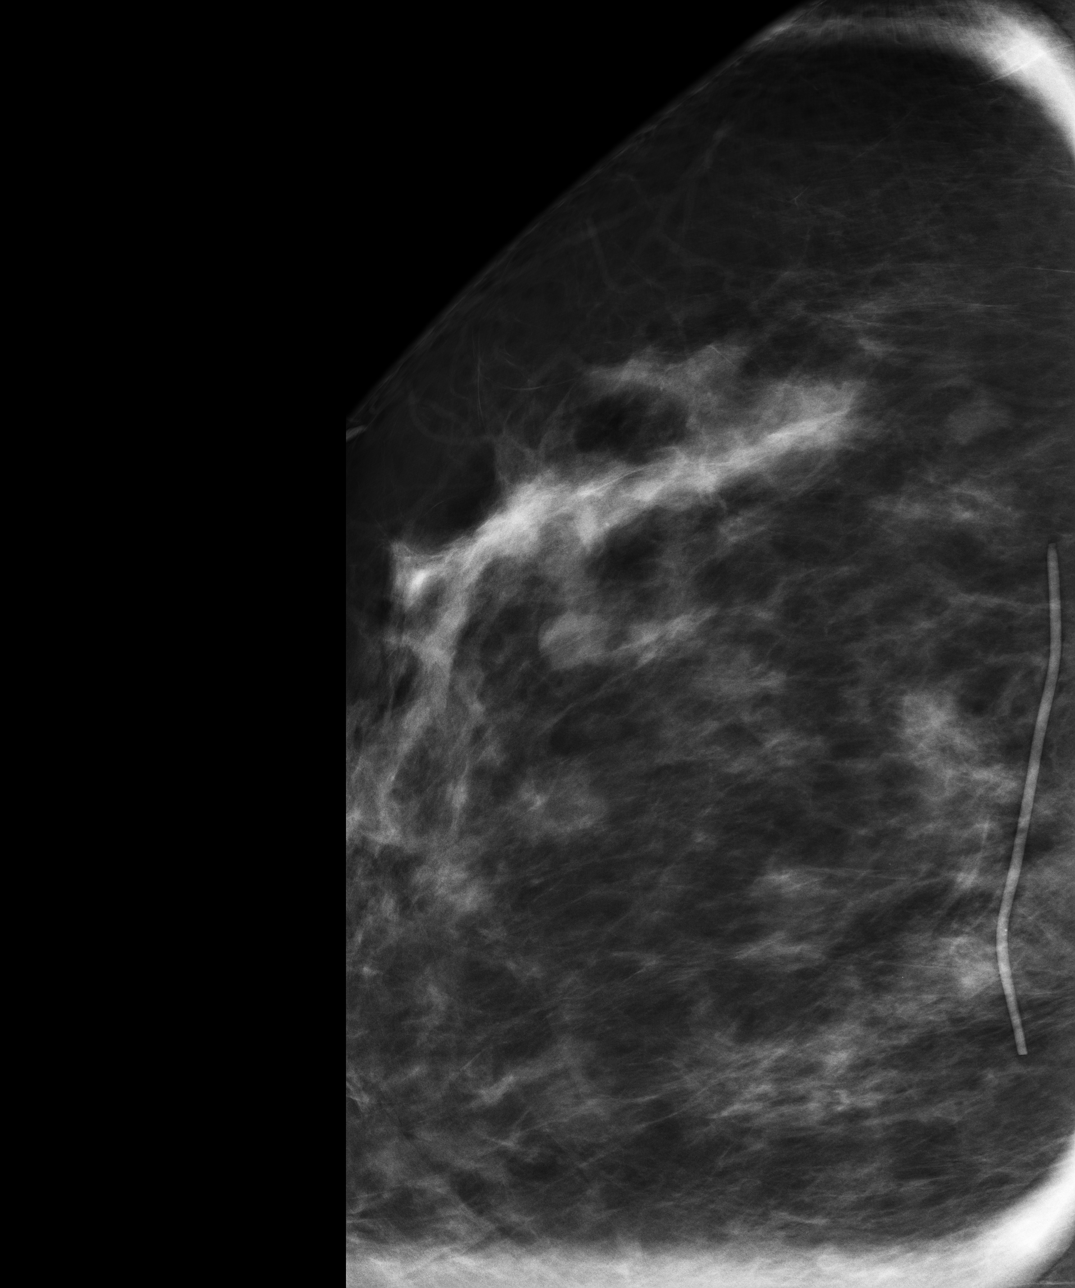

[4 of 4 positions shown; findings below may reference images not displayed]

FINDING: True lateral view and spot compression views of the right breast were
performed. The area of concern in the right upper breast compresses and
blends in with the adjacent fibroglandular tissue without a persistent mass
or architectural distortion. There are no suspicious microcalcifications.
IMPRESSION: 1.     Negative diagnostic right breast mammogram.
2.     Return to annual mammographic follow up recommended.

BI-RADS:  Category 2- Benign.

A negative mammogram report does not preclude biopsy or other evaluation of
a clinically palpable or otherwise suspicious mass or lesion. Breast cancer
may not be detected by mammography in up to 10% of cases.

[REDACTED]

## 2013-02-09 ENCOUNTER — Inpatient Hospital Stay: Payer: Self-pay | Admitting: Internal Medicine

## 2013-02-09 LAB — BASIC METABOLIC PANEL
BUN: 15 mg/dL (ref 7–18)
Calcium, Total: 8.8 mg/dL (ref 8.5–10.1)
Chloride: 105 mmol/L (ref 98–107)
EGFR (African American): >60
EGFR (Non-African Amer.): >60
Potassium: 3.9 mmol/L (ref 3.5–5.1)
Sodium: 139 mmol/L (ref 136–145)

## 2013-02-09 LAB — CBC
HCT: 44.3 % (ref 35.0–47.0)
MCH: 27.1 pg (ref 26.0–34.0)
RBC: 5.38 10*6/uL — ABNORMAL HIGH (ref 3.80–5.20)
RDW: 14.8 % — ABNORMAL HIGH (ref 11.5–14.5)
WBC: 11.7 10*3/uL — ABNORMAL HIGH (ref 3.6–11.0)

## 2013-02-09 LAB — CK TOTAL AND CKMB (NOT AT ARMC)
CK, Total: 103 U/L (ref 21–215)
CK-MB: 1.3 ng/mL (ref 0.5–3.6)

## 2013-02-09 IMAGING — CR DG CHEST 1V PORT
1 series · 1 of 1 positions shown · non-contrast
Comparison: none

REASON FOR EXAM: sob
COMMENTS:

PROCEDURE:     DXR - DXR PORTABLE CHEST SINGLE VIEW  - [DATE]  [DATE]
RESULT:     Cardia megaly with pulmonary vascular and interstitial
prominence. Small pleural effusions noted. These findings are new from
[DATE].

[ap]
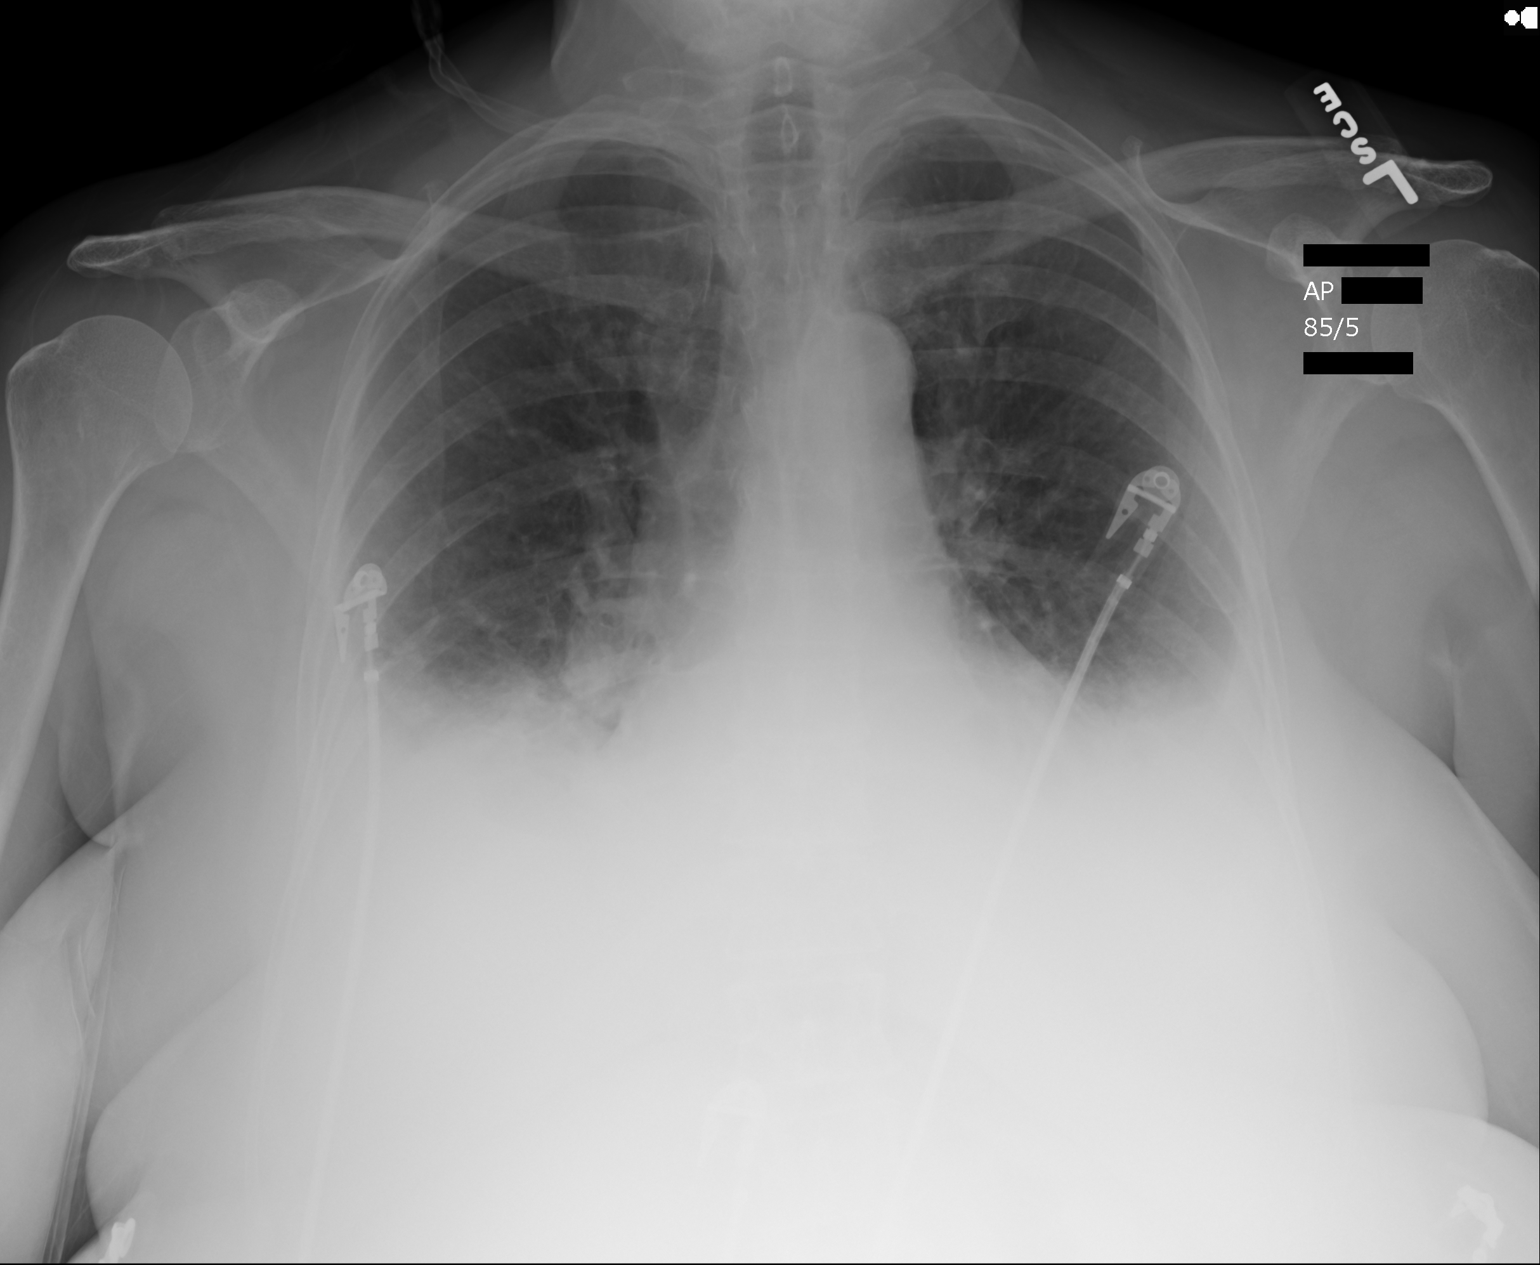

[1 of 1 positions shown; findings below may reference images not displayed]

IMPRESSION: Congestive failure with mild pulmonary edema. Superimposed
pneumonia cannot be excluded.

## 2013-02-10 LAB — CK TOTAL AND CKMB (NOT AT ARMC)
CK, Total: 80 U/L (ref 21–215)
CK, Total: 72 U/L (ref 21–215)
CK-MB: 1.3 ng/mL (ref 0.5–3.6)

## 2013-02-10 LAB — CBC WITH DIFFERENTIAL/PLATELET
Basophil #: 0.1 10*3/uL (ref 0.0–0.1)
Eosinophil #: 0.1 10*3/uL (ref 0.0–0.7)
HCT: 43.8 % (ref 35.0–47.0)
HGB: 14.4 g/dL (ref 12.0–16.0)
Lymphocyte #: 3.2 10*3/uL (ref 1.0–3.6)
Lymphocyte %: 30 %
MCH: 27.3 pg (ref 26.0–34.0)
MCHC: 33 g/dL (ref 32.0–36.0)
Monocyte #: 0.8 x10 3/mm (ref 0.2–0.9)
Monocyte %: 7.4 %
WBC: 10.7 10*3/uL (ref 3.6–11.0)

## 2013-02-10 LAB — BASIC METABOLIC PANEL
BUN: 21 mg/dL — ABNORMAL HIGH (ref 7–18)
Calcium, Total: 8.6 mg/dL (ref 8.5–10.1)
Chloride: 106 mmol/L (ref 98–107)
Co2: 27 mmol/L (ref 21–32)
Glucose: 99 mg/dL (ref 65–99)
Potassium: 3.8 mmol/L (ref 3.5–5.1)

## 2013-02-12 LAB — BASIC METABOLIC PANEL
Calcium, Total: 8.8 mg/dL (ref 8.5–10.1)
Co2: 30 mmol/L (ref 21–32)
Creatinine: 0.94 mg/dL (ref 0.60–1.30)
EGFR (African American): >60
EGFR (Non-African Amer.): >60
Glucose: 89 mg/dL (ref 65–99)
Sodium: 136 mmol/L (ref 136–145)

## 2013-02-12 LAB — TSH: Thyroid Stimulating Horm: 8.19 u[IU]/mL — ABNORMAL HIGH

## 2013-02-13 LAB — T4, FREE: Free Thyroxine: 1.23 ng/dL (ref 0.76–1.46)

## 2014-01-18 DIAGNOSIS — I509 Heart failure, unspecified: Secondary | ICD-10-CM | POA: Insufficient documentation

## 2014-01-18 DIAGNOSIS — I429 Cardiomyopathy, unspecified: Secondary | ICD-10-CM | POA: Insufficient documentation

## 2014-01-18 DIAGNOSIS — E669 Obesity, unspecified: Secondary | ICD-10-CM | POA: Insufficient documentation

## 2014-01-18 DIAGNOSIS — I10 Essential (primary) hypertension: Secondary | ICD-10-CM | POA: Insufficient documentation

## 2014-01-18 DIAGNOSIS — M545 Low back pain, unspecified: Secondary | ICD-10-CM | POA: Insufficient documentation

## 2014-01-18 DIAGNOSIS — E7800 Pure hypercholesterolemia, unspecified: Secondary | ICD-10-CM | POA: Insufficient documentation

## 2014-01-18 DIAGNOSIS — I4891 Unspecified atrial fibrillation: Secondary | ICD-10-CM | POA: Insufficient documentation

## 2014-02-01 ENCOUNTER — Ambulatory Visit: Payer: Self-pay | Admitting: Family Medicine

## 2014-02-01 IMAGING — MG MM DIGITAL SCREENING BILAT W/ CAD
3 series · 7 of 7 positions shown · non-contrast
Comparison: Previous exam(s)

CLINICAL DATA: Screening.

EXAM:
DIGITAL SCREENING BILATERAL MAMMOGRAM WITH CAD

[R CC · right · 5 of 5 slices shown (1 of 2)]
[im 1/5]
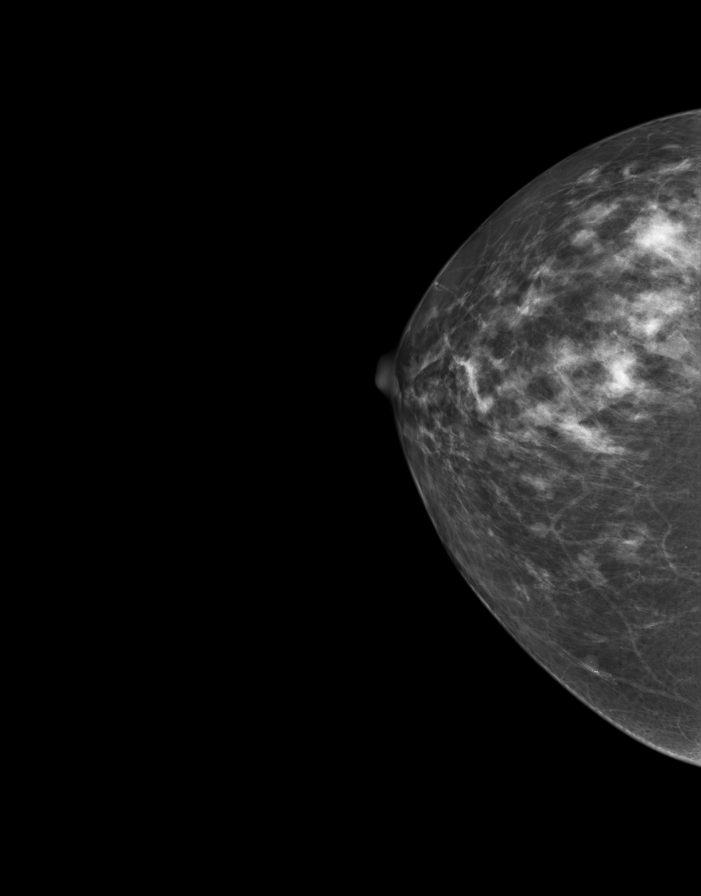
[im 2/5]
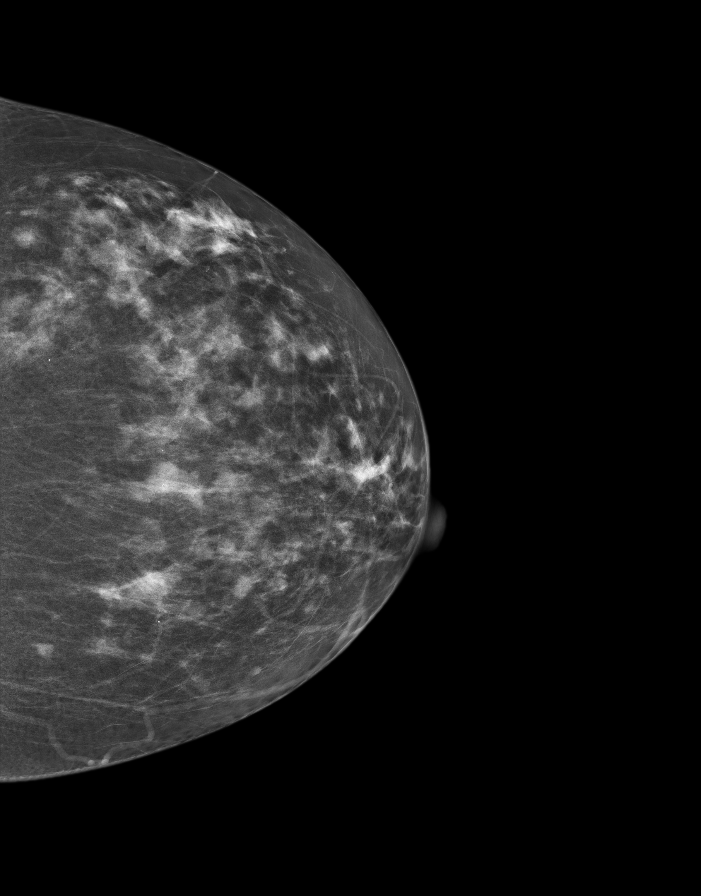
[im 3/5]
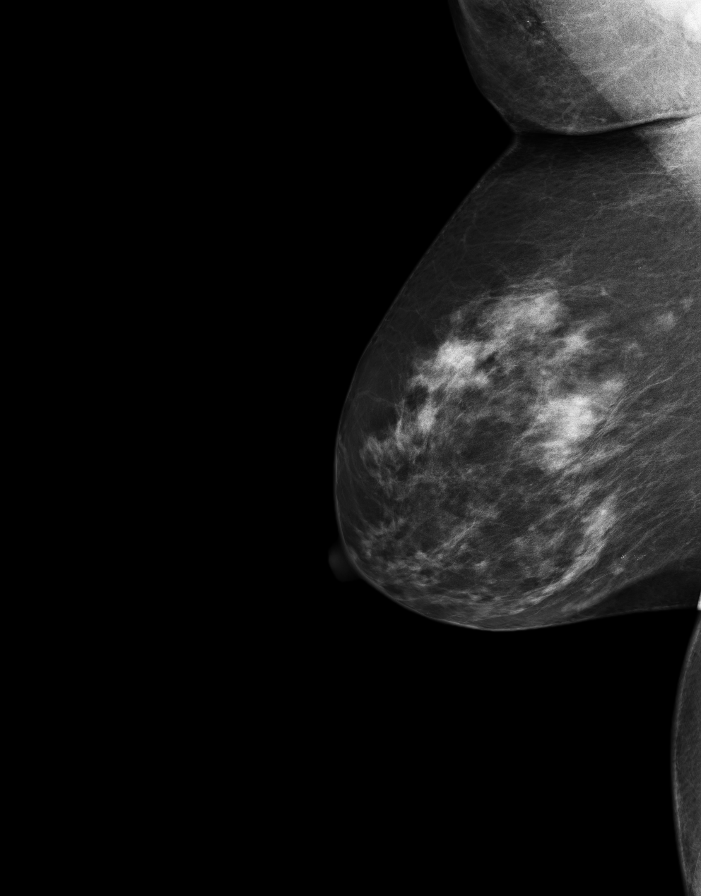
[im 4/5]
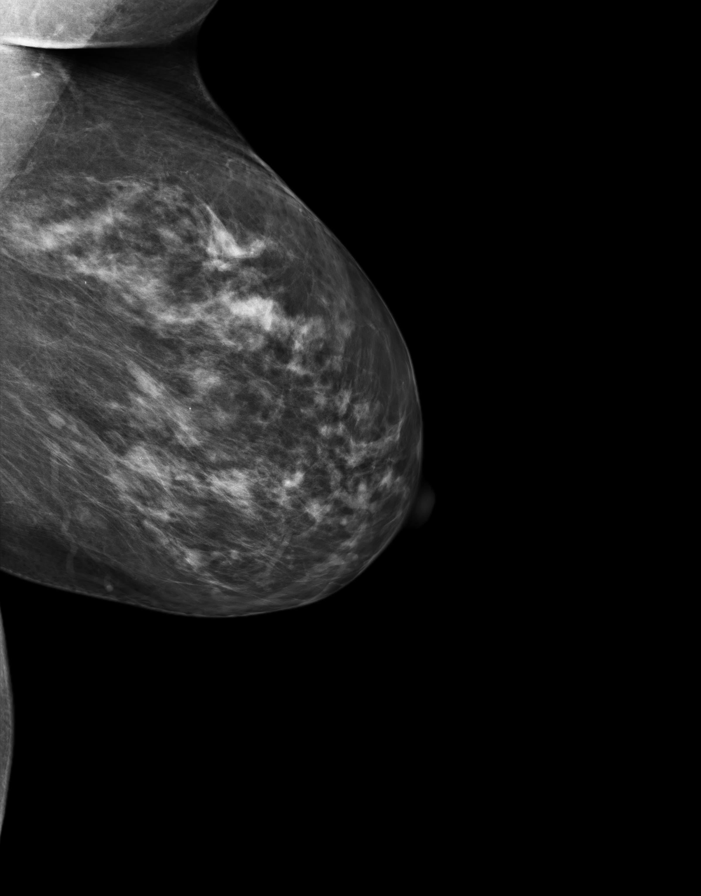
[im 5/5]
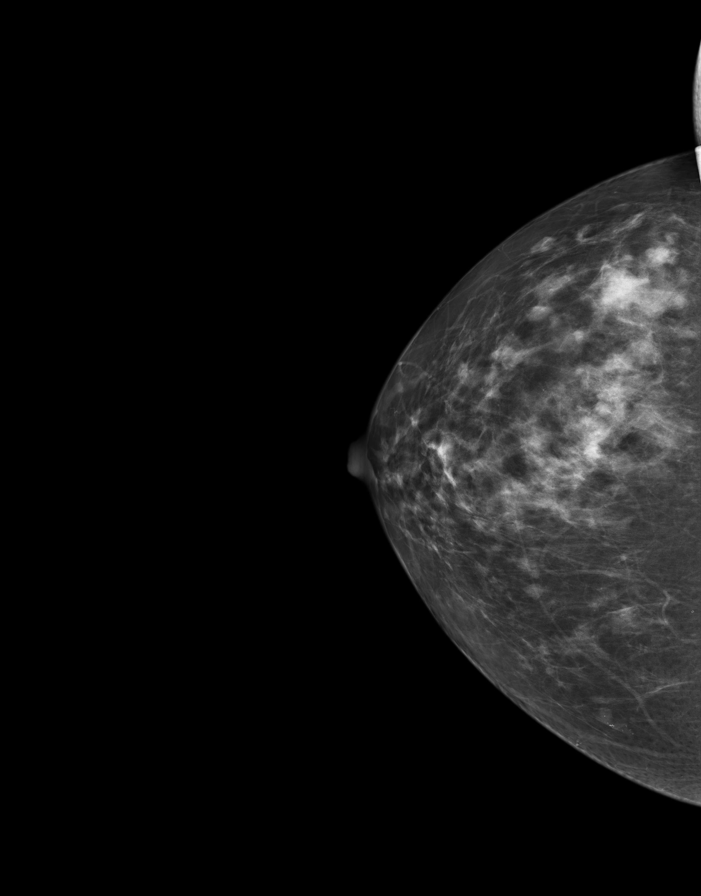

[R MLO]
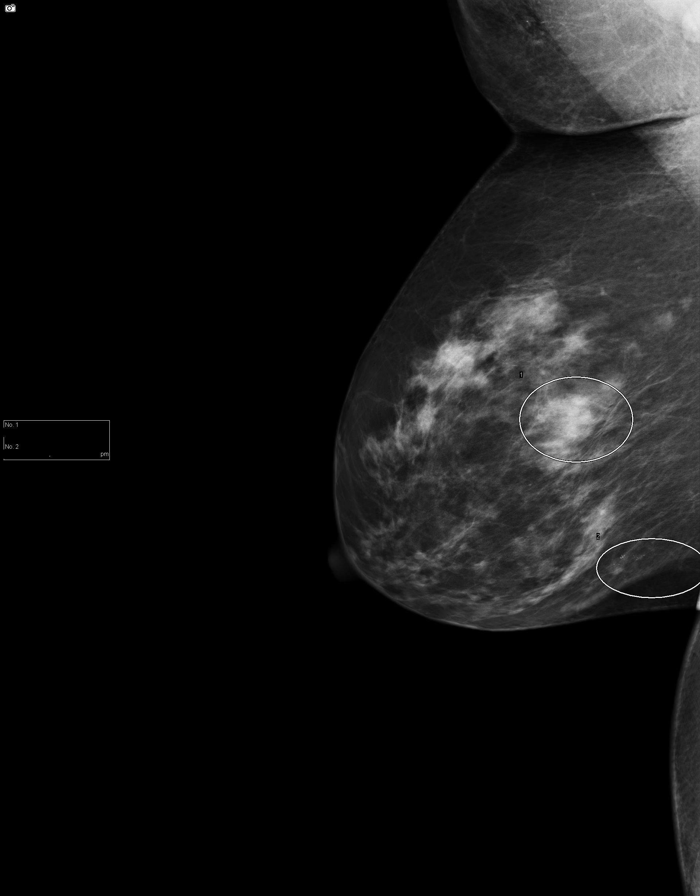

[R CC (2 of 2)]
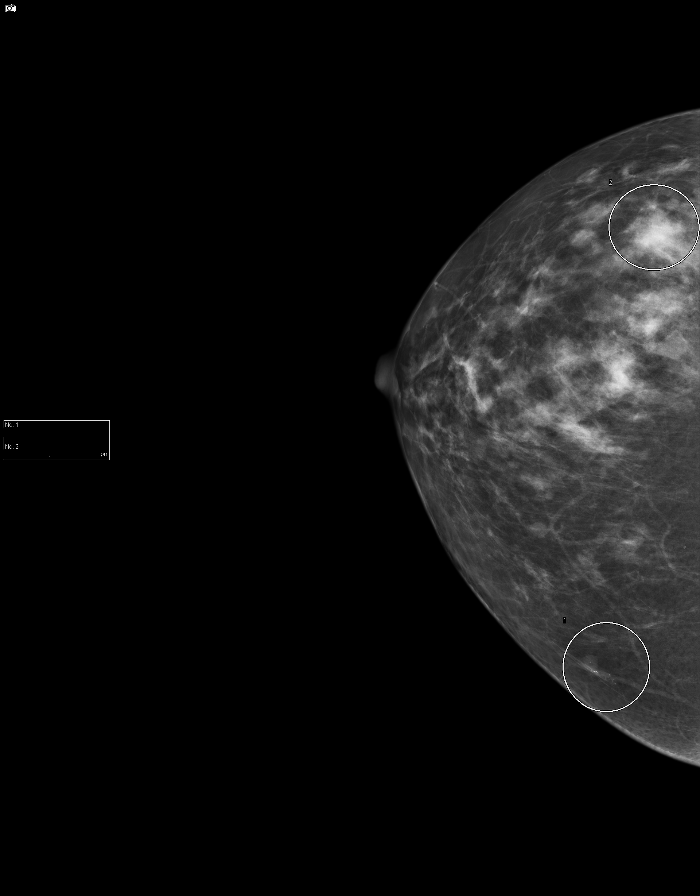

[7 of 7 positions shown; findings below may reference images not displayed]

ACR Breast Density Category c: The breast tissue is heterogeneously
dense, which may obscure small masses.
FINDINGS: In the right breast, a possible mass warrants further evaluation
with spot compression views and possibly ultrasound. There are also
a right calcifications that require further evaluation with
magnified views. In the left breast, no findings suspicious for
malignancy. Images were processed with CAD.
IMPRESSION: Further evaluation is suggested for possible mass and calcifications
in the right breast.

RECOMMENDATION:
Diagnostic mammogram and possibly ultrasound of the right breast.
(Code:[OK])

The patient will be contacted regarding the findings, and additional
imaging will be scheduled.

BI-RADS CATEGORY  0: Incomplete. Need additional imaging evaluation
and/or prior mammograms for comparison.

## 2014-02-11 ENCOUNTER — Ambulatory Visit: Payer: Self-pay | Admitting: Family Medicine

## 2014-02-11 IMAGING — MG MM ADDITIONAL VIEWS AT NO CHARGE
1 series · 4 of 4 positions shown · non-contrast
Comparison: [DATE]

CLINICAL DATA: The patient returns for evaluation of calcifications
and possible mass in the right breast.

EXAM:
DIGITAL DIAGNOSTIC  RIGHT MAMMOGRAM
ULTRASOUND RIGHT BREAST

[R CC · right · 4 of 4 slices shown]
[im 1/4]
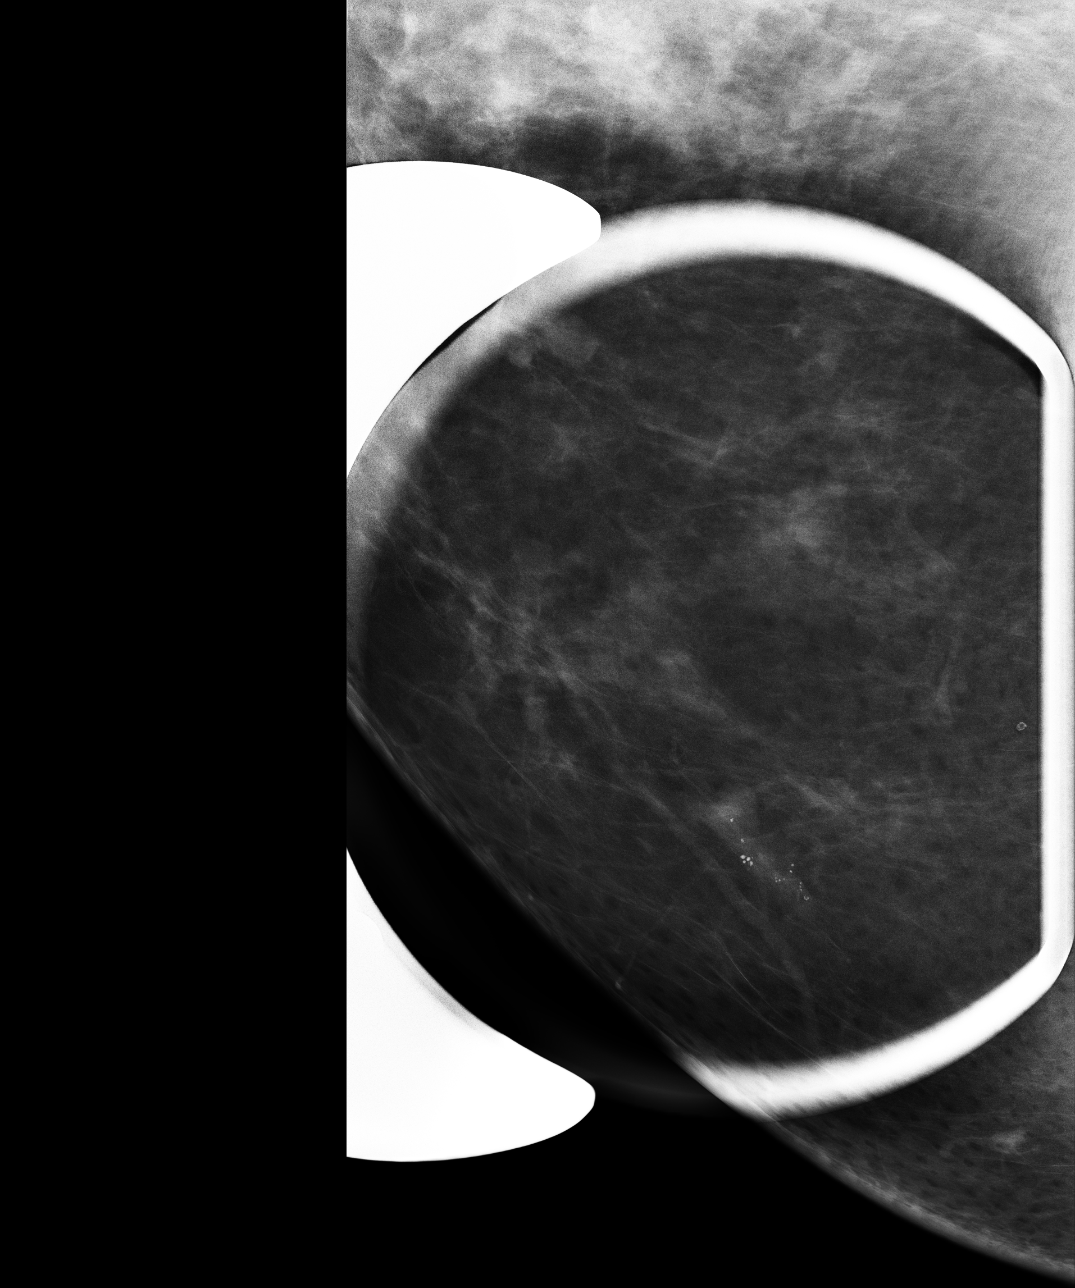
[im 2/4]
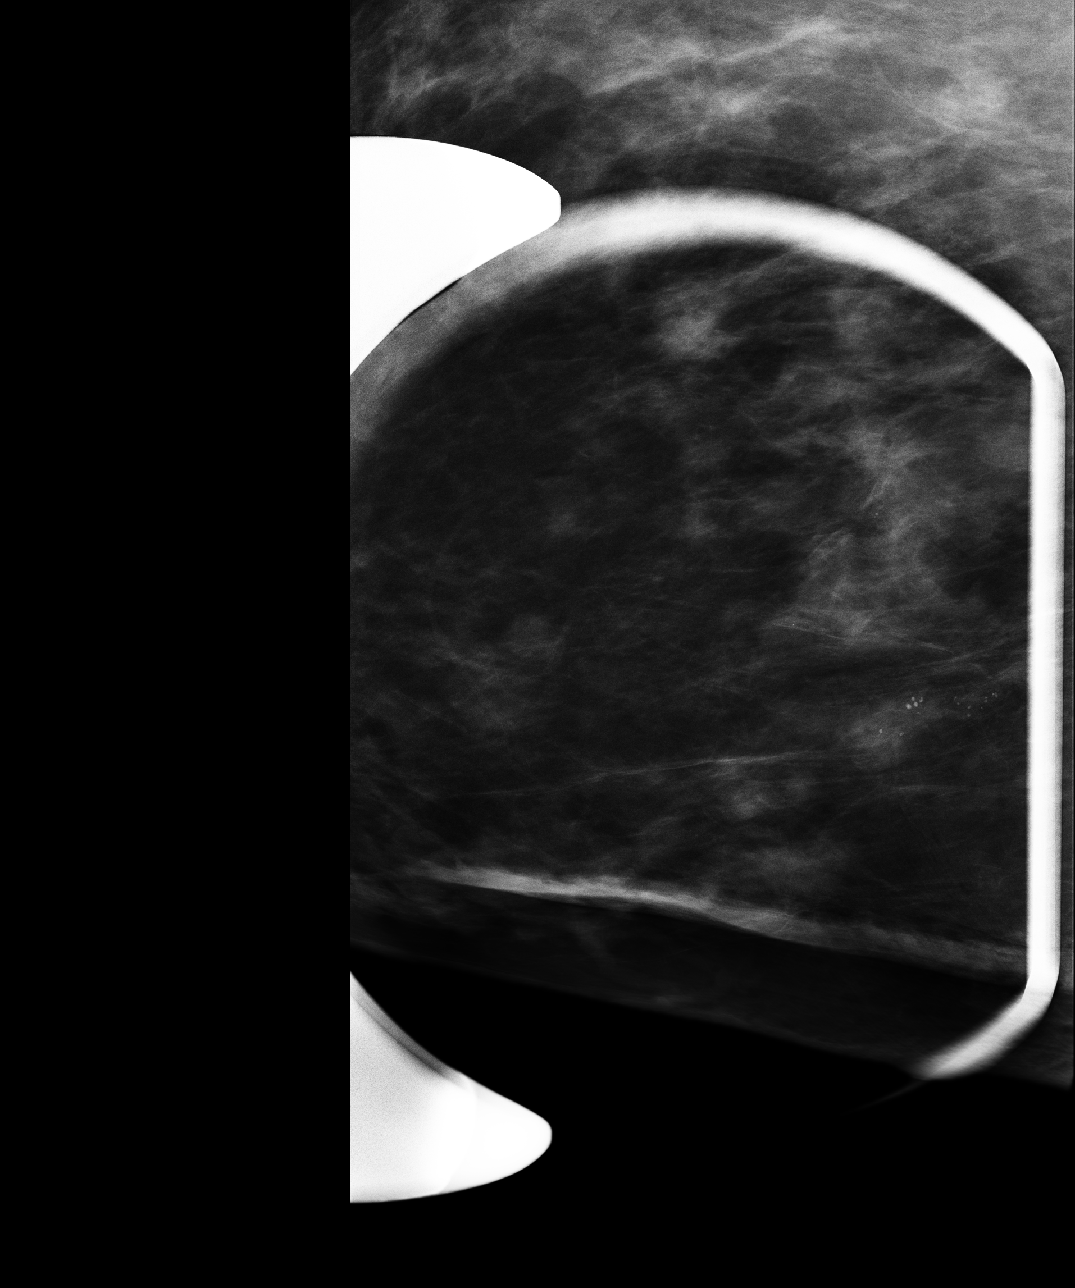
[im 3/4]
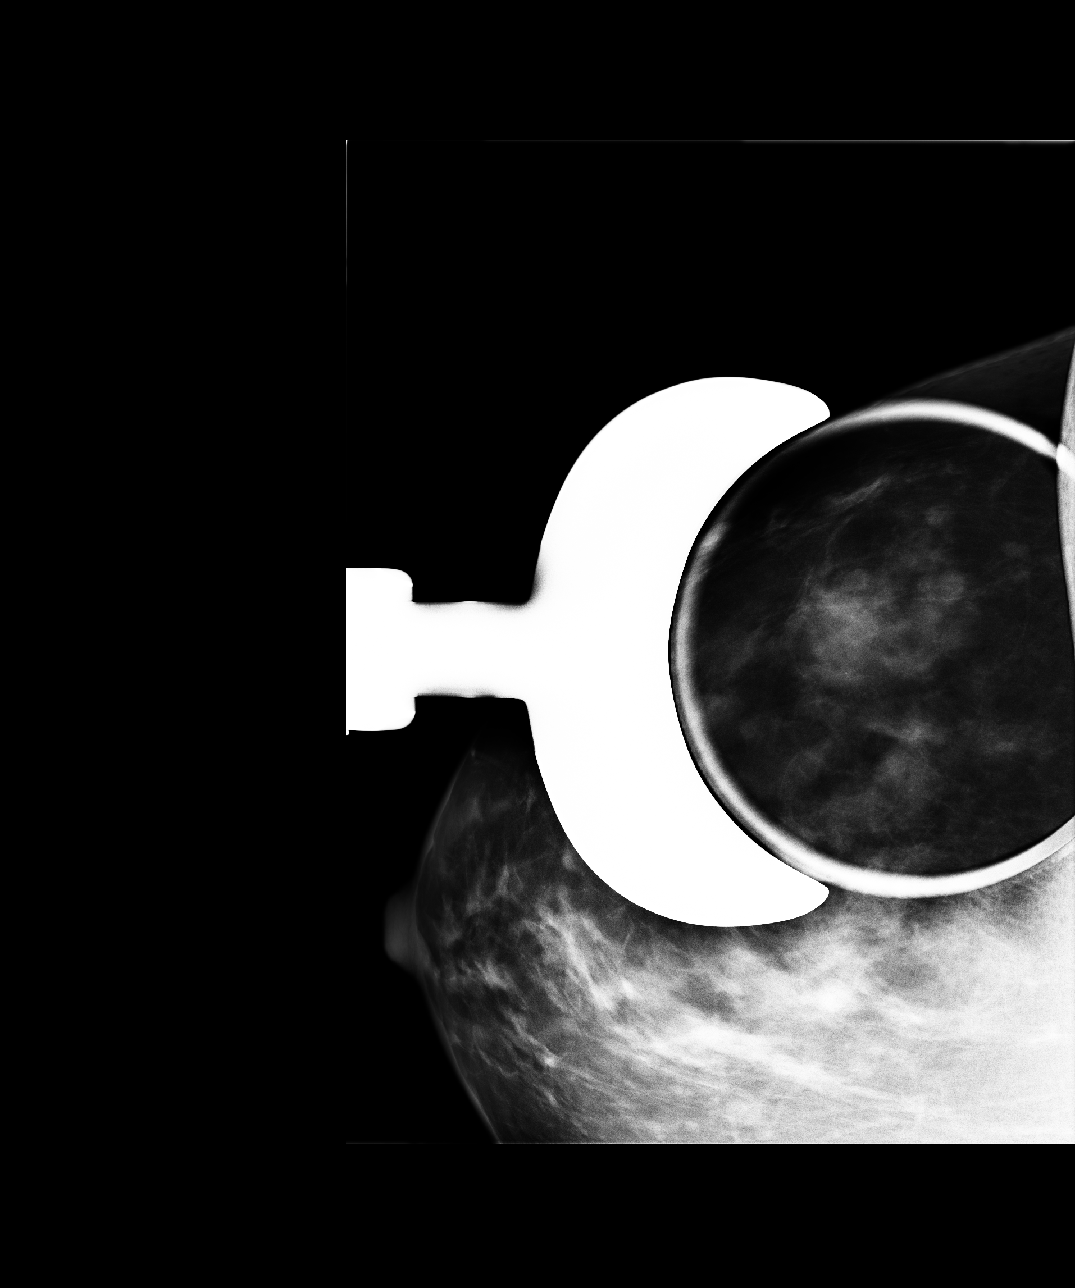
[im 4/4]
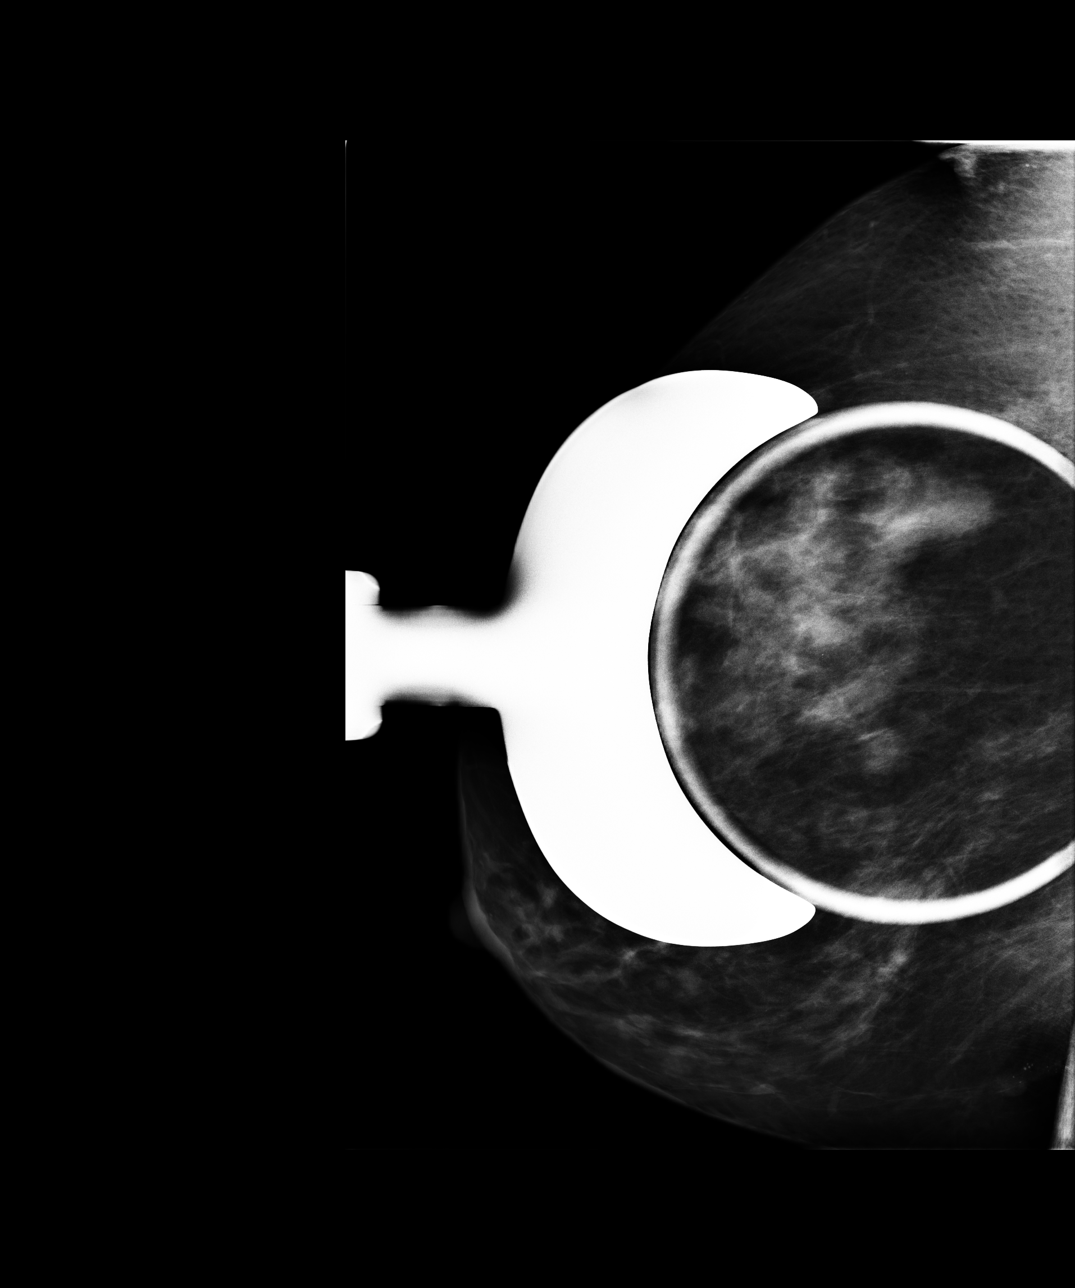

[4 of 4 positions shown; findings below may reference images not displayed]

ACR Breast Density Category c: The breast tissue is heterogeneously
dense, which may obscure small masses.
FINDINGS: Magnified views are performed of calcifications in the lower inner
quadrant of the right breast. A small group of calcifications
measures 1.4 x 0.4 cm and is associated with parenchymal density.
Calcifications vary in size, shape, and density. The calcifications
are in a somewhat linear arrangement. Biopsy is recommended.

Spot compression views are performed of the lateral aspect of the
right breast showing somewhat nodular breast parenchyma without
suspicious mass or distortion.

On physical exam, I palpate no abnormality in the lateral aspect of
right breast.

Ultrasound is performed, showing normal appearing fibroglandular
tissue throughout the lateral quadrants of the right breast. No
mass, distortion, or acoustic shadowing is demonstrated with
ultrasound.
IMPRESSION: 1. Indeterminate calcifications in the lower inner quadrant of the
right breast for which biopsy is recommended.
2. No persistent abnormality in the lateral aspect of the right
breast.

RECOMMENDATION:
1. Stereotactic guided core biopsy is recommended. Scheduling will
be discussed with Dr. TIGER. Biopsy is scheduled [DATE].
2. The patient takes Eliquis and aspirin which will need to be
discontinued prior to biopsy. This can be coordinated with Dr.
TIGER.

I have discussed the findings and recommendations with the patient.
Results were also provided in writing at the conclusion of the
visit. If applicable, a reminder letter will be sent to the patient
regarding the next appointment.

BI-RADS CATEGORY  4: Suspicious.

## 2014-02-18 ENCOUNTER — Ambulatory Visit: Payer: Self-pay | Admitting: Family Medicine

## 2014-02-18 IMAGING — CR MM BREAST BX W/ LOC DEV 1ST LESION IMAGE BX SPEC STEREO GUIDE*R*
4 series · 6 of 6 positions shown · non-contrast
Comparison: Previous exams.

ADDENDUM:
Pathology results of a right breast biopsy demonstrate
fibroadenomatous changes with microcalcifications. Negative for
atypia and malignancy.

Pathology results are concordant with imaging findings.
I telephoned pathology results to the patient on [DATE]. She
reports that she is doing well after the biopsy, with no areas of
concern.
Annual screening mammography is recommended.
CLINICAL DATA: Patient presents for stereotactic core needle biopsy
of a 14 mm group of indeterminate microcalcifications with possible
associated mass over the inner lower right breast.
EXAM:
Right BREAST STEREOTACTIC CORE NEEDLE BIOPSY

[ML (1 of 4)]
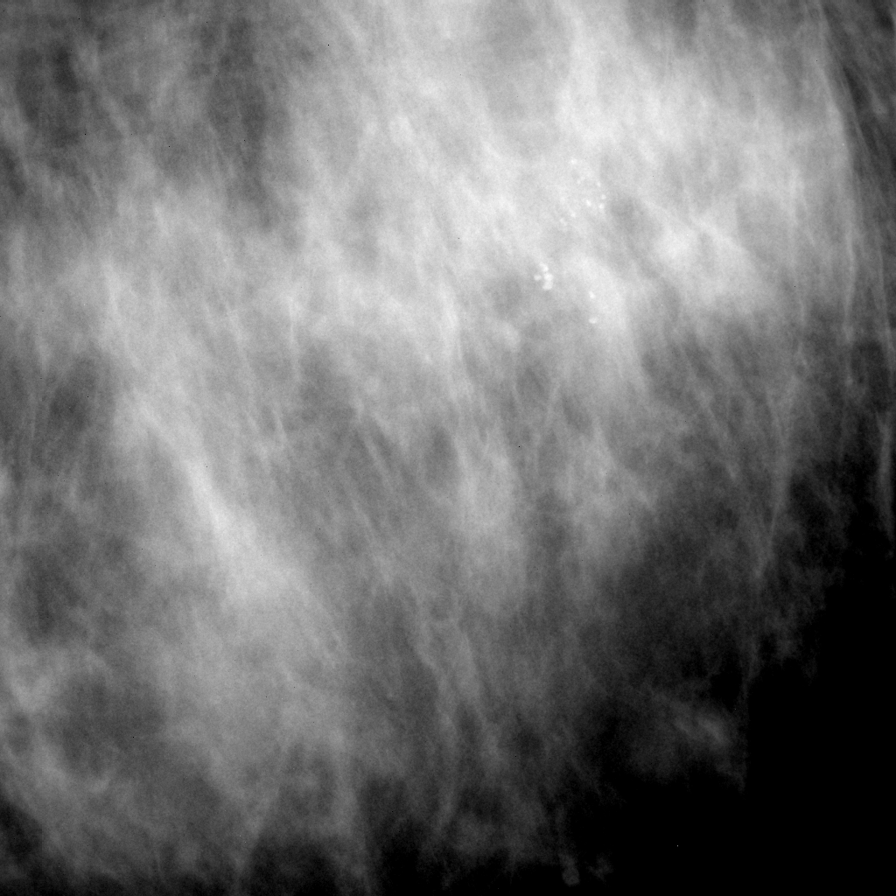

[Series 1: ML · right · 2 of 2 slices shown (2 of 4)]
[im 1/2]
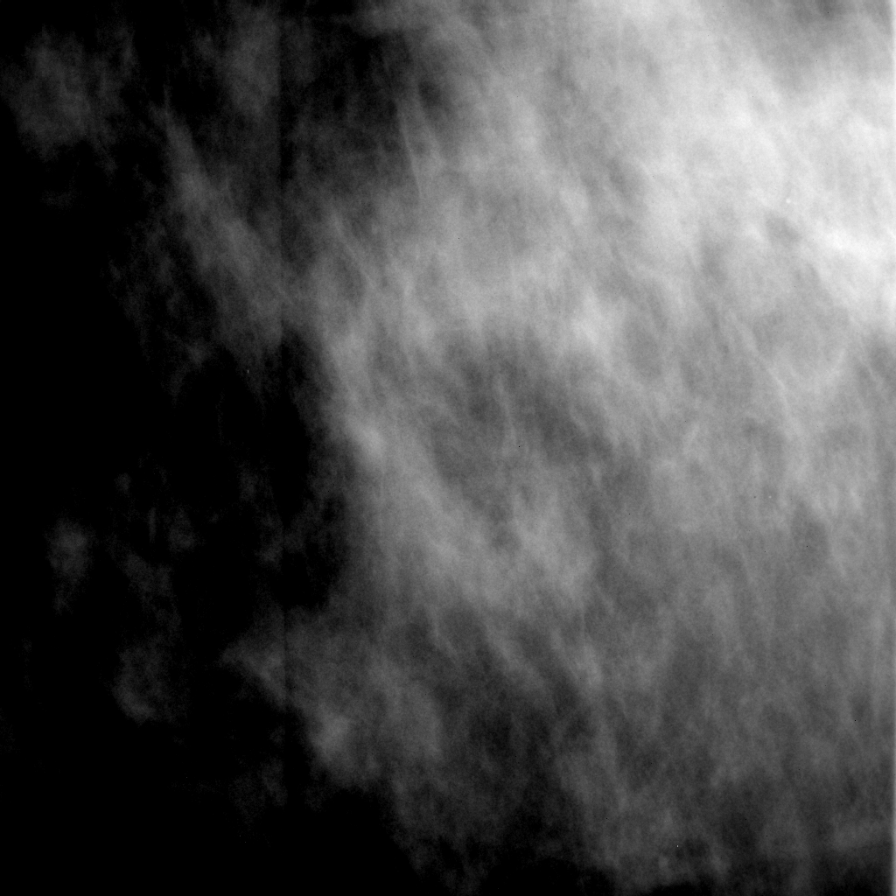
[im 2/2]
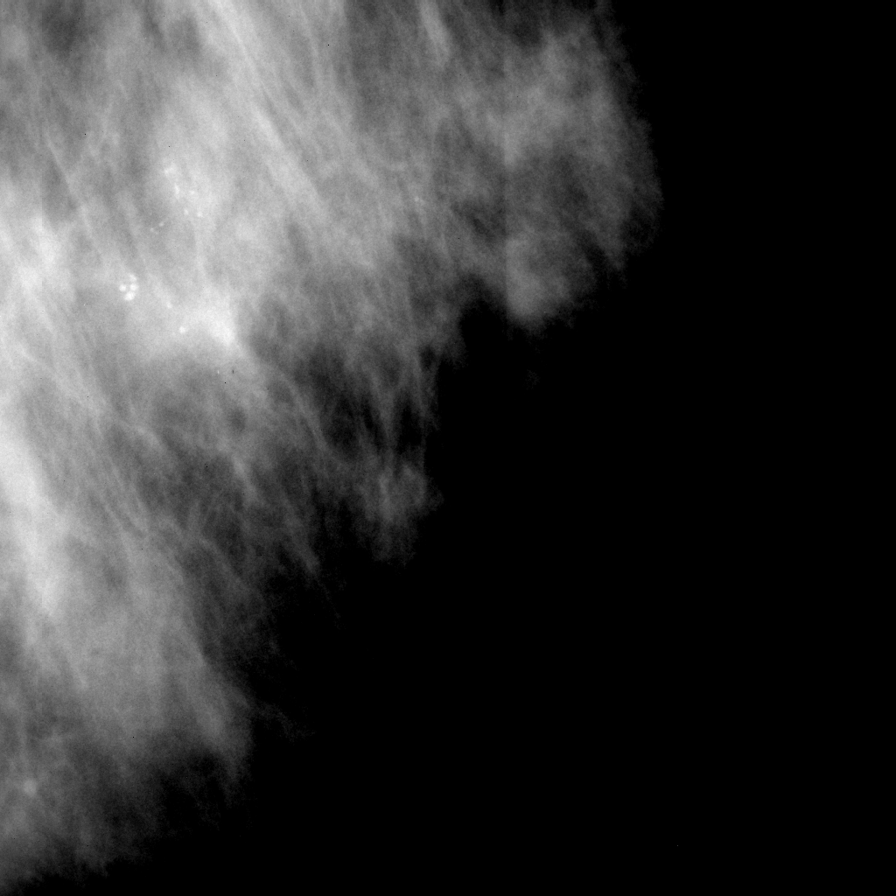

[Series 2: ML · right · 2 of 2 slices shown (3 of 4)]
[im 1/2]
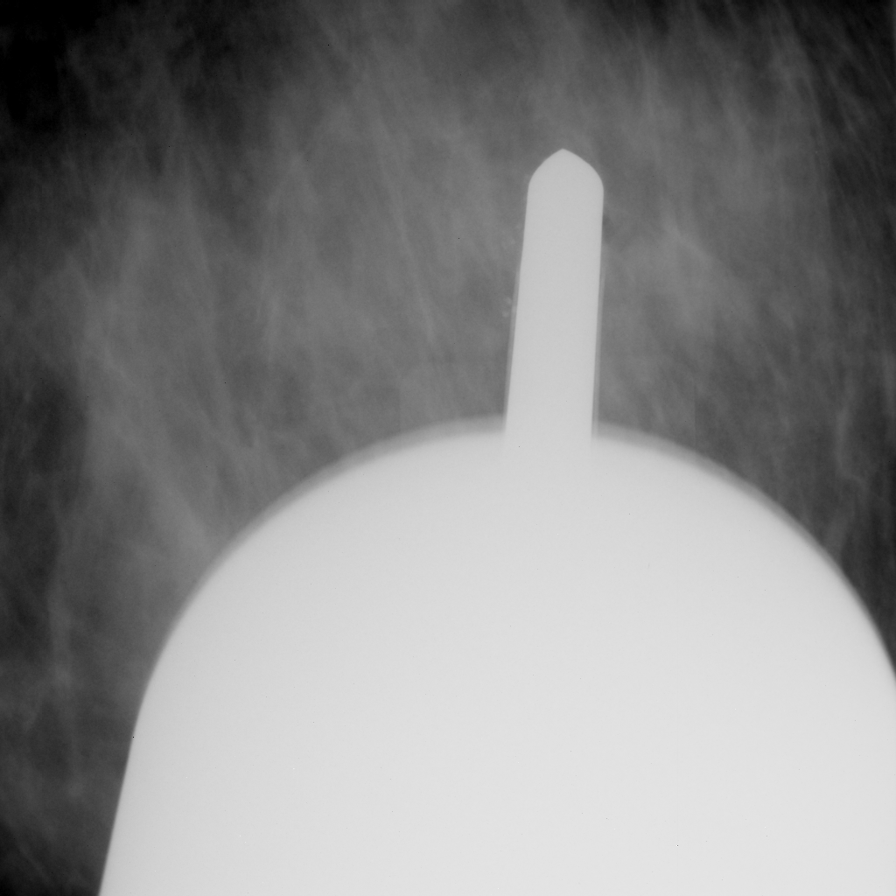
[im 2/2]
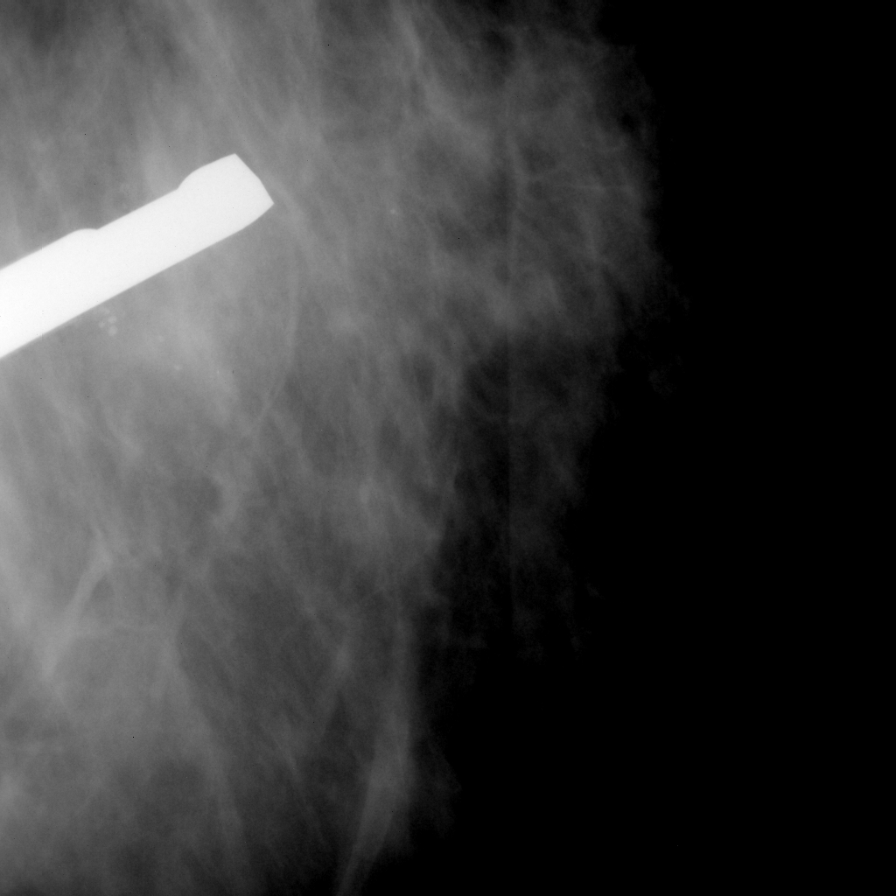

[ML (4 of 4)]
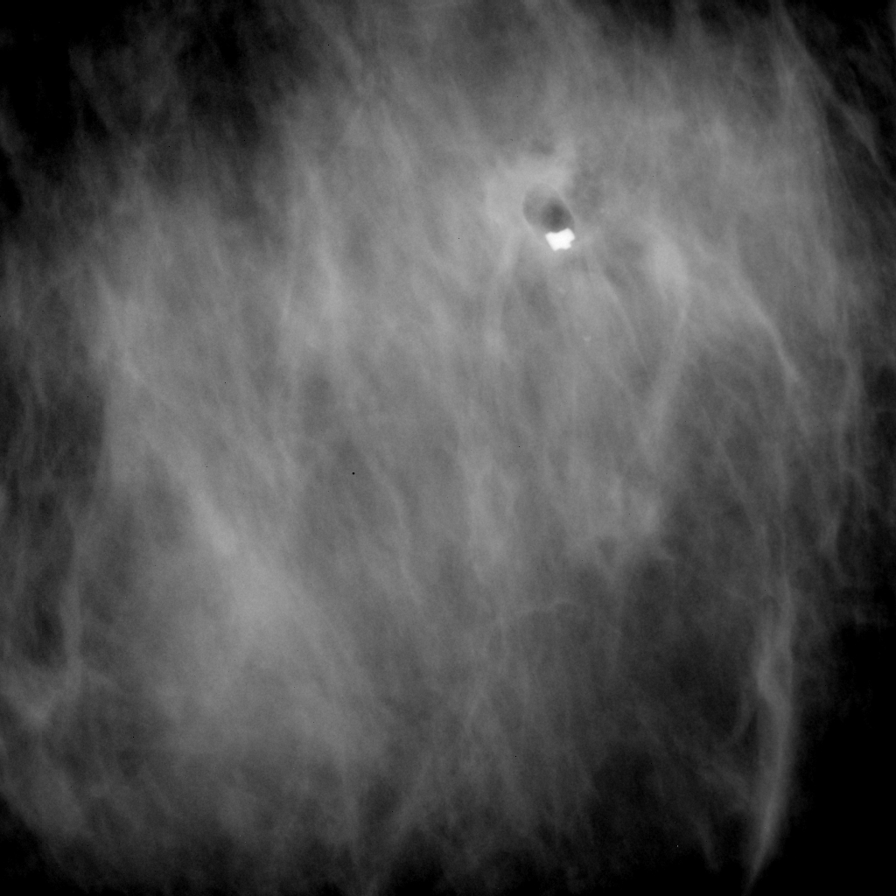

[6 of 6 positions shown; findings below may reference images not displayed]



Using sterile technique and 2% Lidocaine as local anesthetic, under
stereotactic guidance, a 9 gauge vacuum assisted device was used to
perform core needle biopsy of targeted microcalcifications over the
inner lower right breast using a medial to lateral approach.
Specimen radiograph was performed showing multiple of the targeted
microcalcifications. Specimens with calcifications are identified
for pathology.

At the conclusion of the procedure, a top hat shaped tissue marker
clip was deployed into the biopsy cavity. Follow-up 2-view mammogram
confirmed clip placement accurately at the biopsy site.
IMPRESSION: Stereotactic-guided biopsy of right breast microcalcifications. No
apparent complications.

## 2014-02-18 IMAGING — MG MM BREAST BX W/ LOC DEV 1ST LESION IMAGE BX SPEC STEREO GUIDE*R*
3 series · 5 of 5 positions shown · non-contrast
Comparison: Previous exams.

ADDENDUM:
Pathology results of a right breast biopsy demonstrate
fibroadenomatous changes with microcalcifications. Negative for
atypia and malignancy.

Pathology results are concordant with imaging findings.
I telephoned pathology results to the patient on [DATE]. She
reports that she is doing well after the biopsy, with no areas of
concern.
Annual screening mammography is recommended.
CLINICAL DATA: Patient presents for stereotactic core needle biopsy
of a 14 mm group of indeterminate microcalcifications with possible
associated mass over the inner lower right breast.
EXAM:
Right BREAST STEREOTACTIC CORE NEEDLE BIOPSY

[R CC · right · 2 of 2 slices shown (1 of 3)]
[im 1/2]
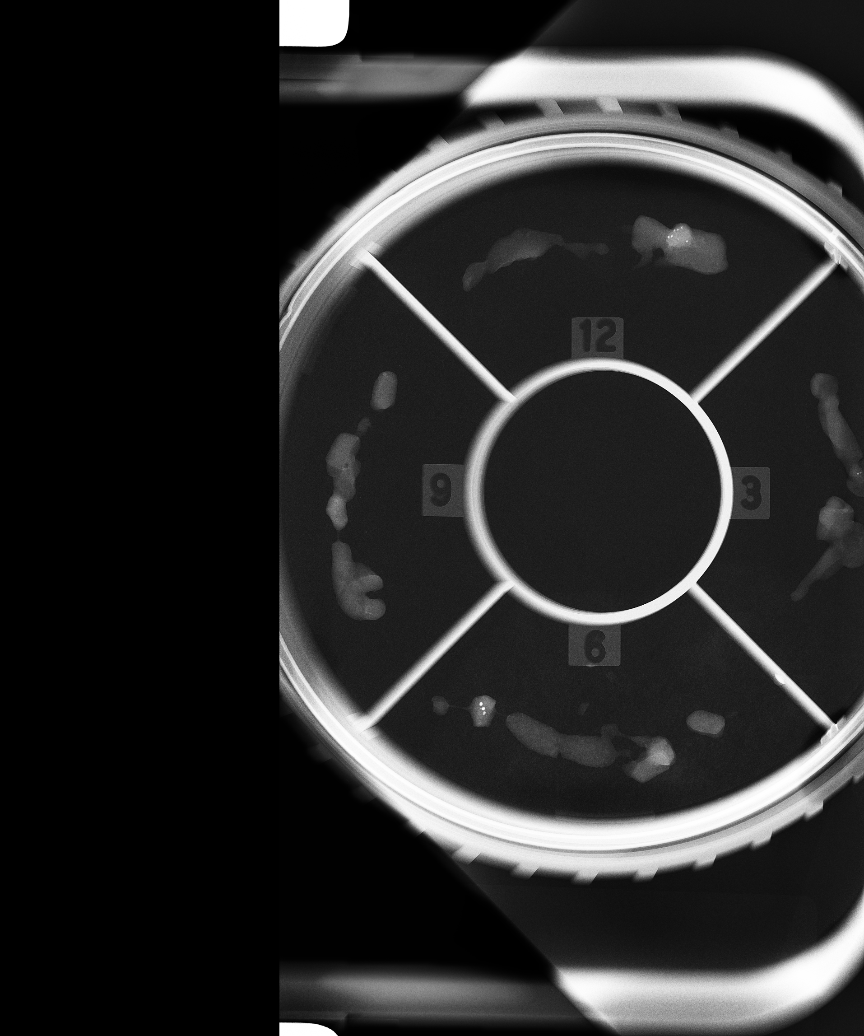
[im 2/2]
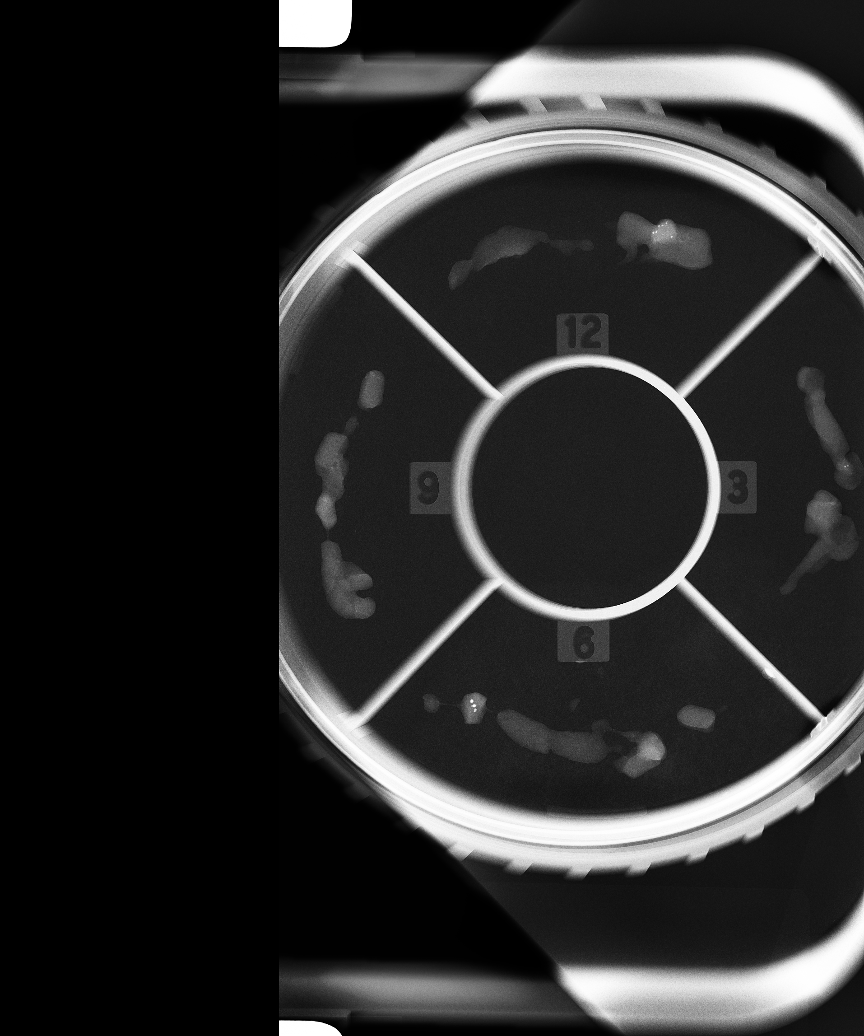

[R CC · right · 2 of 2 slices shown (2 of 3)]
[im 1/2]
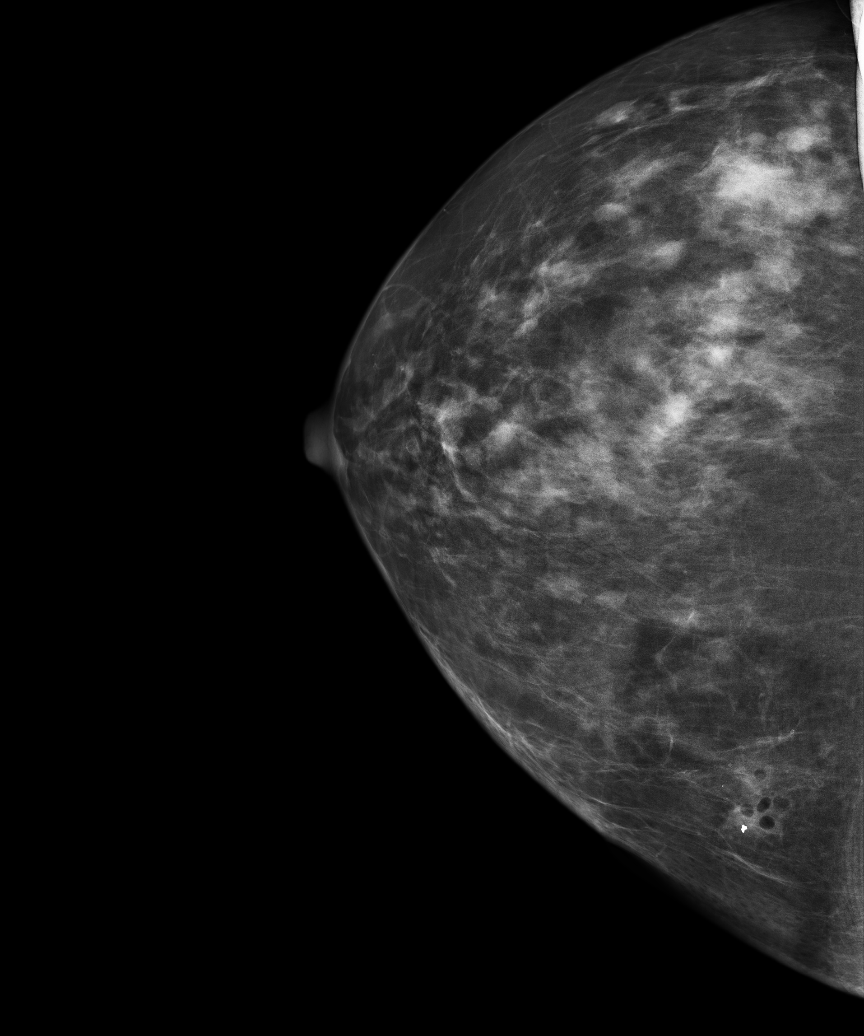
[im 2/2]
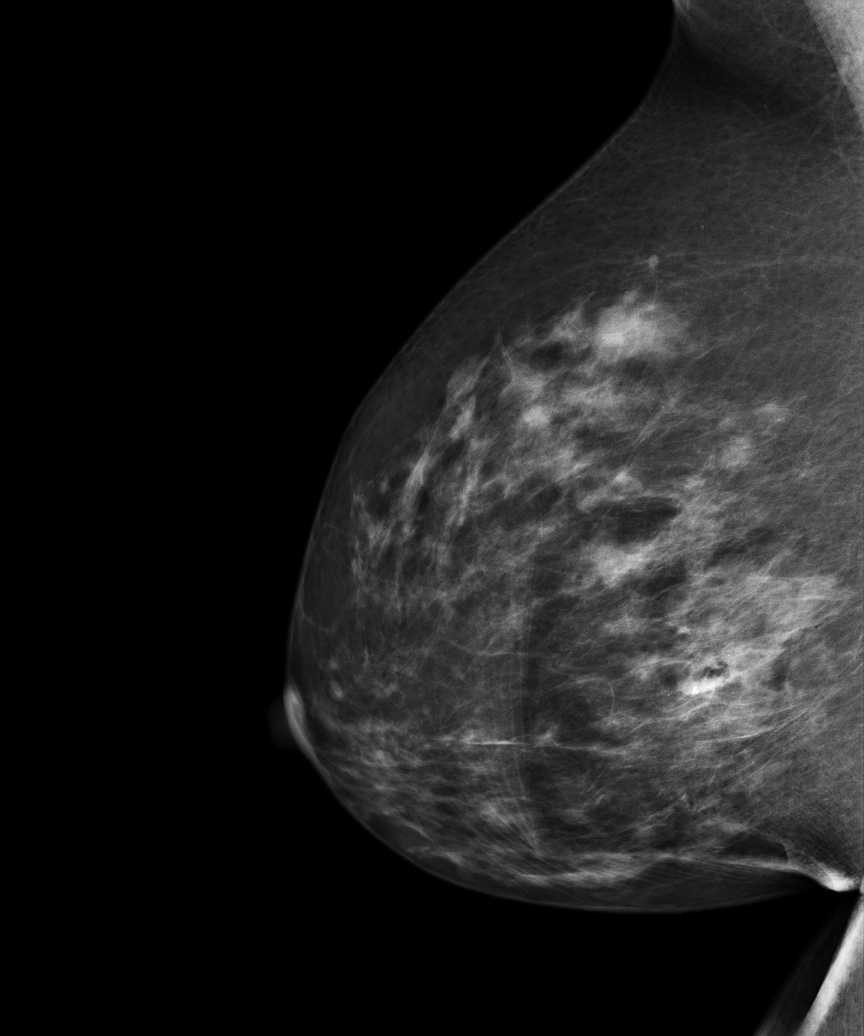

[R CC (3 of 3)]
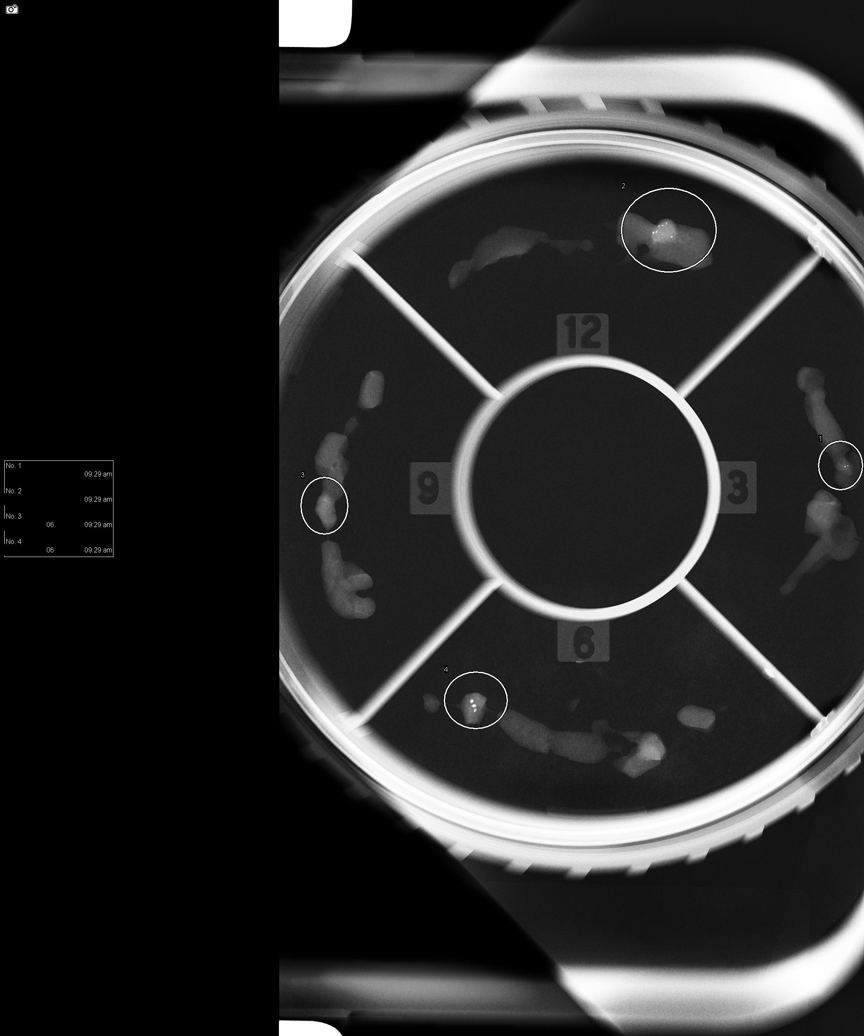

[5 of 5 positions shown; findings below may reference images not displayed]



Using sterile technique and 2% Lidocaine as local anesthetic, under
stereotactic guidance, a 9 gauge vacuum assisted device was used to
perform core needle biopsy of targeted microcalcifications over the
inner lower right breast using a medial to lateral approach.
Specimen radiograph was performed showing multiple of the targeted
microcalcifications. Specimens with calcifications are identified
for pathology.

At the conclusion of the procedure, a top hat shaped tissue marker
clip was deployed into the biopsy cavity. Follow-up 2-view mammogram
confirmed clip placement accurately at the biopsy site.
IMPRESSION: Stereotactic-guided biopsy of right breast microcalcifications. No
apparent complications.

## 2014-02-19 LAB — PATHOLOGY REPORT

## 2014-12-20 NOTE — Consult Note (Signed)
Chief Complaint:  Subjective/Chief Complaint She has improved symptom of sob. She still has AFLutter.   VITAL SIGNS/ANCILLARY NOTES: **Vital Signs.:   15-Jun-14 08:00  Vital Signs Type Routine  Temperature Temperature (F) 97.7  Celsius 36.5  Pulse Pulse 64  Respirations Respirations 26  Systolic BP Systolic BP 85  Diastolic BP (mmHg) Diastolic BP (mmHg) 55  Mean BP 65  Pulse Ox % Pulse Ox % 97  Pulse Ox Activity Level  At rest  Oxygen Delivery 2L; Nasal Cannula  Pulse Ox Heart Rate 64  *Intake and Output.:   15-Jun-14 09:00  Grand Totals Intake:  240 Output:      Net:  240 24 Hr.:  245  Oral Intake      In:  240  Cardizem      In:  0   Brief Assessment:  GEN well developed, well nourished, no acute distress   Cardiac Regular   Respiratory normal resp effort  clear BS  crackles   Gastrointestinal Normal   Gastrointestinal details normal Soft   EXTR negative cyanosis/clubbing, negative edema   Lab Results: Thyroid:  16-Jun-14 06:06   Thyroid Stimulating Hormone  8.19 (0.45-4.50 (International Unit)  ----------------------- Pregnant patients have  different reference  ranges for TSH:  - - - - - - - - - -  Pregnant, first trimetser:  0.36 - 2.50 uIU/mL)  LabObservation:  14-Jun-14 09:18   OBSERVATION Reason for Test  Cardiology:  14-Jun-14 09:18   Echo Doppler REASON FOR EXAM:     COMMENTS:     PROCEDURE: [facility] - ECHO DOPPLER COMPLETE(TRANSTHOR)  - Feb 10 2013  9:18AM   RESULT: Echocardiogram Report  Patient Name:   Dorothy Gross Date of Exam: 02/10/2013 Medical Rec #:  [id number]               Custom1: Date of Birth:  02-Oct-1950           Height:       64.0 in Patient Age:    65 years             Weight:       192.0 lb Patient Gender: F                    BSA:          1.92 m??  Indications: Atrial Fib/AFLU/CHF Sonographer:    Janalee Dane RCS Referring Phys: Abel Presto, J  Summary:  1. Left ventricular ejection fraction, by visual  estimation, is 30 to  35%.  2. Severely decreased global left ventricular systolic function.  3. Moderately reduced RV systolic function.  4. Moderate pleural effusion in the left lateral region.  5. Mild mitral valve regurgitation.  6. Moderate to severe tricuspid regurgitation.  7. Mildly increased left ventricular posterior wall thickness. 2D AND M-MODE MEASUREMENTS (normal ranges within parentheses): Left Ventricle:          Normal IVSd (2D):      1.17 cm (0.7-1.1) LVPWd (2D):     1.24 cm (0.7-1.1) Aorta/LA:                  Normal LVIDd (2D):     3.86 cm (3.4-5.7) Aortic Root (2D): 3.40 cm (2.4-3.7) LVIDs (2D):     3.27 cm           Left Atrium (2D): 4.50 cm (1.9-4.0) LV FS (2D):     15.3 %   (>25%) LV EF (2D):  32.9 %   (>50%)                                   Right Ventricle:                                   RVd (2D): LV SYSTOLIC FUNCTION BY 2D PLANIMETRY (MOD): EF-A4C View: 33.9 % EF-A2C View: 31.8 % EF-Biplane: 35.4 % SPECTRAL DOPPLER ANALYSIS (where applicable): Tricuspid Valve and PA/RV Systolic Pressure: TR Max Velocity: 1.78 m/s RA  Pressure: 10 mmHg RVSP/PASP: 22.7 mmHg  PHYSICIAN INTERPRETATION: Left Ventricle: The left ventricular internal cavity size was normal. LV  septal wall thickness was normal. LV posterior wall thickness was mildly   increased. Global LV systolic function was severely decreased. Left  ventricular ejectionfraction, by visual estimation, is 30 to 35%. Right Ventricle: The right ventricular size is normal. Global RV systolic  function is moderately reduced. Left Atrium: The left atrium is normal in size. Right Atrium: The right atrium is normal in size. Pericardium: There is no evidence of pericardial effusion. There is a  moderate pleural effusion in the left lateral region. Mitral Valve: The mitral valve is normal in structure. Mild mitral valve  regurgitation is seen. Tricuspid Valve: The tricuspid valve is normal. Moderate to severe   tricuspid regurgitation is visualized. The tricuspid regurgitant velocity  is 1.78 m/s, and with an assumed right atrial pressure of 10 mmHg, the  estimated right ventricular systolic pressure is normal at 22.7 mmHg. Aortic Valve: The aortic valve is normal. The aortic valve is   structurally normal, with no evidence of sclerosis or stenosis. Pulmonic Valve: The pulmonic valve is normal.  [place] MD Electronically signed by [place] MD Signature Date/Time: 02/10/2013/6:24:12 PM  *** Final ***  IMPRESSION: .    Verified By: Yolonda Kida, M.D., MD  16-Jun-14 11:54   Ventricular Rate 131  Atrial Rate 131  QRS Duration 104  QT 388  QTc 572  R Axis 95  T Axis 67  ECG interpretation Supraventricular tachycardia Rightward axis Incomplete right bundle branch block Nonspecific ST and T wave abnormality Abnormal ECG When compared with ECG of 09-Feb-2013 17:44, Sinus rhythm has replaced Atrial flutter QRS duration has increased ST elevation has replaced ST depression in Inferior leads ST no longer depressed in Lateral leads ----------unconfirmed---------- Confirmed by OVERREAD, NOT ([id number]), editor PEARSON, BARBARA ([id number]) on 02/12/2013 12:12:26 PM  Routine Chem:  14-Jun-14 05:55   Glucose, Serum 99  BUN  21  Creatinine (comp) 0.95  Sodium, Serum 140  Potassium, Serum 3.8  Chloride, Serum 106  CO2, Serum 27  Calcium (Total), Serum 8.6  Anion Gap 7  Osmolality (calc) 282  eGFR (African American) >60  eGFR (Non-African American) >60 (eGFR values <27m/min/1.73 m2 may be an indication of chronic kidney disease (CKD). Calculated eGFR is useful in patients with stable renal function. The eGFR calculation will not be reliable in acutely ill patients when serum creatinine is changing rapidly. It is not useful in  patients on dialysis. The eGFR calculation may not be applicable to patients at the low and high extremes of body sizes, pregnant women, and  vegetarians.)  16-Jun-14 06:06   Glucose, Serum 89  BUN  25  Creatinine (comp) 0.94  Sodium, Serum 136  Potassium, Serum 4.4  Chloride, Serum 101  CO2, Serum 30  Calcium (Total), Serum 8.8  Anion Gap  5  Osmolality (calc) 276  eGFR (African American) >60  eGFR (Non-African American) >60 (eGFR values <72m/min/1.73 m2 may be an indication of chronic kidney disease (CKD). Calculated eGFR is useful in patients with stable renal function. The eGFR calculation will not be reliable in acutely ill patients when serum creatinine is changing rapidly. It is not useful in  patients on dialysis. The eGFR calculation may not be applicable to patients at the low and high extremes of body sizes, pregnant women, and vegetarians.)  Cardiac:  14-Jun-14 02:01   Troponin I < 0.02 (0.00-0.05 0.05 ng/mL or less: NEGATIVE  Repeat testing in 3-6 hrs  if clinically indicated. >0.05 ng/mL: POTENTIAL  MYOCARDIAL INJURY. Repeat  testing in 3-6 hrs if  clinically indicated. NOTE: An increase or decrease  of 30% or more on serial  testing suggests a  clinically important change)    05:55   CK, Total 80  CPK-MB, Serum 1.3 (Result(s) reported on 10 Feb 2013 at 06:56AM.)    09:58   Troponin I < 0.02 (0.00-0.05 0.05 ng/mL or less: NEGATIVE  Repeat testing in 3-6 hrs  if clinically indicated. >0.05 ng/mL: POTENTIAL  MYOCARDIAL INJURY. Repeat  testing in 3-6 hrs if  clinically indicated. NOTE: An increase or decrease  of 30% or more on serial  testing suggests a  clinically important change)    14:02   CK, Total 72  CPK-MB, Serum 0.9 (Result(s) reported on 10 Feb 2013 at 02:34PM.)  Routine Coag:  16-Jun-14 06:06   Prothrombin 14.7  INR 1.1 (INR reference interval applies to patients on anticoagulant therapy. A single INR therapeutic range for coumarins is not optimal for all indications; however, the suggested range for most indications is 2.0 - 3.0. Exceptions to the INR Reference Range may  include: Prosthetic heart valves, acute myocardial infarction, prevention of myocardial infarction, and combinations of aspirin and anticoagulant. The need for a higher or lower target INR must be assessed individually. Reference: The Pharmacology and Management of the Vitamin K  antagonists: the seventh ACCP Conference on Antithrombotic and Thrombolytic Therapy. CVXYIA.1655Sept:126 (3suppl): [id number] A HCT value >55% may artifactually increase the PT.  In one study,  the increase was an average of 25%. Reference:  "Effect on Routine and Special Coagulation Testing Values of Citrate Anticoagulant Adjustment in Patients with High HCT Values." American Journal of Clinical Pathology 2006;126:400-405.)  Routine Hem:  14-Jun-14 05:55   WBC (CBC) 10.7  RBC (CBC)  5.29  Hemoglobin (CBC) 14.4  Hematocrit (CBC) 43.8  Platelet Count (CBC) 345  MCV 83  MCH 27.3  MCHC 33.0  RDW 14.5  Neutrophil % 60.4  Lymphocyte % 30.0  Monocyte % 7.4  Eosinophil % 1.2  Basophil % 1.0  Neutrophil # 6.5  Lymphocyte # 3.2  Monocyte # 0.8  Eosinophil # 0.1  Basophil # 0.1 (Result(s) reported on 10 Feb 2013 at 0[facility])   Radiology Results: XRay:    13-Jun-14 18:13, Chest Portable Single View  Chest Portable Single View   REASON FOR EXAM:    sob  COMMENTS:       PROCEDURE: DXR - DXR PORTABLE CHEST SINGLE VIEW  - Feb 09 2013  6:13PM     RESULT: Cardia megaly with pulmonary vascular and interstitial   prominence. Small pleural effusions noted. These findings are new from   07/18/2009.    IMPRESSION:  Congestive failure with mild pulmonary edema. Superimposed  pneumonia cannot be excluded.        Verified By: Osa Craver, M.D., MD  Cardiology:    13-Jun-14 17:44, ED ECG  Ventricular Rate 142  Atrial Rate 284  QRS Duration 80  QT 274  QTc 421  P Axis -105  R Axis 92  T Axis -44  ECG interpretation   Atrial flutter with variable A-V block  Rightward axis  Nonspecific ST and T  wave abnormality  Abnormal ECG  When compared with ECG of 17-Jul-2009 10:29,  Atrial flutter has replaced Sinus rhythm  Vent. rate has increased BY  51 BPM  ST now depressedin Inferior leads  ST now depressed in Lateral leads  Nonspecific T wave abnormality now evident in Lateral leads  ----------unconfirmed----------  Confirmed by OVERREAD, NOT ([id number]), editor PEARSON, BARBARA ([id number]) on 02/12/2013 8:44:24 AM  ED ECG     14-Jun-14 09:18, Echo Doppler  Echo Doppler   REASON FOR EXAM:      COMMENTS:       PROCEDURE: [facility] - ECHO DOPPLER COMPLETE(TRANSTHOR)  - Feb 10 2013  9:18AM     RESULT: Echocardiogram Report    Patient Name:   Dorothy Gross Date of Exam: 02/10/2013  Medical Rec #:  [id number]               Custom1:  Date of Birth:  12-Feb-1951           Height:       64.0 in  Patient Age:    67 years             Weight:       192.0 lb  Patient Gender: F                    BSA:          1.92 m??    Indications: Atrial Fib/AFLU/CHF  Sonographer:    Janalee Dane RCS  Referring Phys: Abel Presto, J    Summary:   1. Left ventricular ejection fraction, by visual estimation, is 30 to   35%.   2. Severely decreased global left ventricular systolic function.   3. Moderately reduced RV systolic function.   4. Moderate pleural effusion in the left lateral region.   5. Mild mitral valve regurgitation.   6. Moderate to severe tricuspid regurgitation.   7. Mildly increased left ventricular posterior wall thickness.  2D AND M-MODE MEASUREMENTS (normal ranges within parentheses):  Left Ventricle:          Normal  IVSd (2D):      1.17 cm (0.7-1.1)  LVPWd (2D):     1.24 cm (0.7-1.1) Aorta/LA:                  Normal  LVIDd (2D):     3.86 cm (3.4-5.7) Aortic Root (2D): 3.40 cm (2.4-3.7)  LVIDs (2D):     3.27 cm           Left Atrium (2D): 4.50 cm (1.9-4.0)  LV FS (2D):     15.3 %   (>25%)  LV EF (2D):     32.9 %   (>50%)                                    Right Ventricle:  RVd (2D):  LV SYSTOLIC FUNCTION BY 2D PLANIMETRY (MOD):  EF-A4C View: 33.9 % EF-A2C View: 31.8 % EF-Biplane: 35.4 %  SPECTRAL DOPPLER ANALYSIS (where applicable):  Tricuspid Valve and PA/RV Systolic Pressure: TR Max Velocity: 1.78 m/s RA   Pressure: 10 mmHg RVSP/PASP: 22.7 mmHg    PHYSICIAN INTERPRETATION:  Left Ventricle: The left ventricular internal cavity size was normal. LV   septal wall thickness was normal. LV posterior wall thickness was mildly     increased. Global LV systolic function was severely decreased. Left   ventricular ejectionfraction, by visual estimation, is 30 to 35%.  Right Ventricle: The right ventricular size is normal. Global RV systolic   function is moderately reduced.  Left Atrium: The left atrium is normal in size.  Right Atrium: The right atrium is normal in size.  Pericardium: There is no evidence of pericardial effusion. There is a   moderate pleural effusion in the left lateral region.  Mitral Valve: The mitral valve is normal in structure. Mild mitral valve   regurgitation is seen.  Tricuspid Valve: The tricuspid valve is normal. Moderate to severe   tricuspid regurgitation is visualized. The tricuspid regurgitant velocity   is 1.78 m/s, and with an assumed right atrial pressure of 10 mmHg, the   estimated right ventricular systolic pressure is normal at 22.7 mmHg.  Aortic Valve: The aortic valve is normal. The aortic valve is     structurally normal, with no evidence of sclerosis or stenosis.  Pulmonic Valve: The pulmonic valve is normal.    [place] MD  Electronically signed by [place] MD  Signature Date/Time: 02/10/2013/6:24:12 PM    *** Final ***    IMPRESSION: .        Verified By: Yolonda Kida, M.D., MD    16-Jun-14 11:54, ECG  Ventricular Rate 131  Atrial Rate 131  QRS Duration 104  QT 388  QTc 572  R Axis 95  T Axis 67  ECG interpretation   Supraventricular  tachycardia  Rightward axis  Incomplete right bundle branch block  Nonspecific ST and T wave abnormality  Abnormal ECG  When compared with ECG of 09-Feb-2013 17:44,  Sinus rhythm has replaced Atrial flutter  QRS duration has increased  ST elevation has replaced ST depression in Inferior leads  ST no longer depressed in Lateral leads  ----------unconfirmed----------  Confirmed by OVERREAD, NOT ([id number]), editor PEARSON, BARBARA ([id number]) on 02/12/2013 12:12:26 PM  ECG    Assessment/Plan:  Invasive Device Daily Assessment of Necessity:  Does the patient currently have any of the following indwelling devices? none   Assessment/Plan:  Assessment IMP CHF CM AFlutter URI Abn Ekg .   Plan PLAN CCU Lovenox for short term anticoug Agree with ECHO with depressed LVF Lasix prn 02 prn Rate control woth diltiazem IV Continue B-blocker Consider Amiodarone for rhythm We may do carioversion prior to d/c Cath for CHF/CM ASA 81 mg daily for now Consider Eliquis as outpt   Electronic Signatures: Lujean Amel D (MD)  (Signed 16-Jun-14 14:27)  Authored: Chief Complaint, VITAL SIGNS/ANCILLARY NOTES, Brief Assessment, Lab Results, Radiology Results, Assessment/Plan   Last Updated: 16-Jun-14 14:27 by Lujean Amel D (MD)

## 2014-12-20 NOTE — Consult Note (Signed)
Chief Complaint:  Subjective/Chief Complaint s/p CATH WITH NON-ISCHEMIC cm. iMPROVED SOB nOcp   VITAL SIGNS/ANCILLARY NOTES: **Vital Signs.:   17-Jun-14 13:00  Vital Signs Type Routine  Temperature Source oral  Pulse Pulse 98  Respirations Respirations 32  Systolic BP Systolic BP 825  Diastolic BP (mmHg) Diastolic BP (mmHg) 43  Mean BP 69  Pulse Ox % Pulse Ox % 94  Oxygen Delivery Room Air/ 21 %  Pulse Ox Heart Rate 122  *Intake and Output.:   Daily 17-Jun-14 07:00  Grand Totals Intake:  3469.2 Output:  3500    Net:  -30.8 24 Hr.:  -30.8  Oral Intake      In:  970  IV (Primary)      In:  2400  Urine ml     Out:  3500  Length of Stay Totals Intake:  7164.2 Output:  8305    Net:  -1140.8   Brief Assessment:  GEN well developed, well nourished, no acute distress, critically ill appearing   Cardiac Irregular  murmur present  -- LE edema  + JVD  --Gallop   Respiratory normal resp effort  crackles   Gastrointestinal Normal   Gastrointestinal details normal Soft   EXTR negative cyanosis/clubbing, negative edema   Lab Results: Thyroid:  17-Jun-14 08:58   Thyroxine, Free 1.23 (Result(s) reported on 13 Feb 2013 at 09:49AM.)   Radiology Results: XRay:    13-Jun-14 18:13, Chest Portable Single View  Chest Portable Single View   REASON FOR EXAM:    sob  COMMENTS:       PROCEDURE: DXR - DXR PORTABLE CHEST SINGLE VIEW  - Feb 09 2013  6:13PM     RESULT: Cardia megaly with pulmonary vascular and interstitial   prominence. Small pleural effusions noted. These findings are new from   07/18/2009.    IMPRESSION:  Congestive failure with mild pulmonary edema. Superimposed   pneumonia cannot be excluded.        Verified By: Osa Craver, M.D., MD  Cardiology:    13-Jun-14 17:44, ED ECG  Ventricular Rate 142  Atrial Rate 284  QRS Duration 80  QT 274  QTc 421  P Axis -105  R Axis 92  T Axis -44  ECG interpretation   Atrial flutter with variable A-V  block  Rightward axis  Nonspecific ST and T wave abnormality  Abnormal ECG  When compared with ECG of 17-Jul-2009 10:29,  Atrial flutter has replaced Sinus rhythm  Vent. rate has increased BY  51 BPM  ST now depressedin Inferior leads  ST now depressed in Lateral leads  Nonspecific T wave abnormality now evident in Lateral leads  ----------unconfirmed----------  Confirmed by OVERREAD, NOT ([id number]), editor PEARSON, BARBARA ([id number]) on 02/12/2013 8:44:24 AM  ED ECG     14-Jun-14 09:18, Echo Doppler  Echo Doppler   REASON FOR EXAM:      COMMENTS:       PROCEDURE: [facility] - ECHO DOPPLER COMPLETE(TRANSTHOR)  - Feb 10 2013  9:18AM     RESULT: Echocardiogram Report    Patient Name:   Dorothy Gross Date of Exam: 02/10/2013  Medical Rec #:  [id number]               Custom1:  Date of Birth:  [birth year]           Height:       64.0 in  Patient Age:    64 years  Weight:       192.0 lb  Patient Gender: F                    BSA:          1.92 m??    Indications: Atrial Fib/AFLU/CHF  Sonographer:    Janalee Dane RCS  Referring Phys: Abel Presto, J    Summary:   1. Left ventricular ejection fraction, by visual estimation, is 30 to   35%.   2. Severely decreased global left ventricular systolic function.   3. Moderately reduced RV systolic function.   4. Moderate pleural effusion in the left lateral region.   5. Mild mitral valve regurgitation.   6. Moderate to severe tricuspid regurgitation.   7. Mildly increased left ventricular posterior wall thickness.  2D AND M-MODE MEASUREMENTS (normal ranges within parentheses):  Left Ventricle:          Normal  IVSd (2D):      1.17 cm (0.7-1.1)  LVPWd (2D):     1.24 cm (0.7-1.1) Aorta/LA:                  Normal  LVIDd (2D):     3.86 cm (3.4-5.7) Aortic Root (2D): 3.40 cm (2.4-3.7)  LVIDs (2D):     3.27 cm           Left Atrium (2D): 4.50 cm (1.9-4.0)  LV FS (2D):     15.3 %   (>25%)  LV EF (2D):     32.9 %   (>50%)                                     Right Ventricle:                                    RVd (2D):  LV SYSTOLIC FUNCTION BY 2D PLANIMETRY (MOD):  EF-A4C View: 33.9 % EF-A2C View: 31.8 % EF-Biplane: 35.4 %  SPECTRAL DOPPLER ANALYSIS (where applicable):  Tricuspid Valve and PA/RV Systolic Pressure: TR Max Velocity: 1.78 m/s RA   Pressure: 10 mmHg RVSP/PASP: 22.7 mmHg    PHYSICIAN INTERPRETATION:  Left Ventricle: The left ventricular internal cavity size was normal. LV   septal wall thickness was normal. LV posterior wall thickness was mildly     increased. Global LV systolic function was severely decreased. Left   ventricular ejectionfraction, by visual estimation, is 30 to 35%.  Right Ventricle: The right ventricular size is normal. Global RV systolic   function is moderately reduced.  Left Atrium: The left atrium is normal in size.  Right Atrium: The right atrium is normal in size.  Pericardium: There is no evidence of pericardial effusion. There is a   moderate pleural effusion in the left lateral region.  Mitral Valve: The mitral valve is normal in structure. Mild mitral valve   regurgitation is seen.  Tricuspid Valve: The tricuspid valve is normal. Moderate to severe   tricuspid regurgitation is visualized. The tricuspid regurgitant velocity   is 1.78 m/s, and with an assumed right atrial pressure of 10 mmHg, the   estimated right ventricular systolic pressure is normal at 22.7 mmHg.  Aortic Valve: The aortic valve is normal. The aortic valve is     structurally normal, with no evidence of sclerosis or stenosis.  Pulmonic Valve: The pulmonic  valve is normal.    [id number] Dwayne Callwood MD  Electronically signed by [place] MD  Signature Date/Time: 02/10/2013/6:24:12 PM    *** Final ***    IMPRESSION: .        Verified By: Yolonda Kida, M.D., MD    16-Jun-14 11:54, ECG  Ventricular Rate 131  Atrial Rate 131  QRS Duration 104  QT 388  QTc 572  R Axis 95  T Axis 67  ECG  interpretation   Supraventricular tachycardia  Rightward axis  Incomplete right bundle branch block  Nonspecific ST and T wave abnormality  Abnormal ECG  When compared with ECG of 09-Feb-2013 17:44,  Sinus rhythm has replaced Atrial flutter  QRS duration has increased  ST elevation has replaced ST depression in Inferior leads  ST no longer depressed in Lateral leads  ----------unconfirmed----------  Confirmed by OVERREAD, NOT ([id number]), editor PEARSON, BARBARA ([id number]) on 02/12/2013 12:12:26 PM  ECG     18-Jun-14 07:17, ECG  Ventricular Rate 87  Atrial Rate 271  QRS Duration 88  QT 370  QTc 445  R Axis 78  T Axis 95  ECG interpretation   Atrial flutter with variable A-V block  Nonspecific ST and T wave abnormality  Abnormal ECG  When compared with ECG of 12-Feb-2013 11:54,  Atrial flutter has replaced Sinus rhythm  Vent. rate has decreased BY  44 BPM  ----------unconfirmed----------  Confirmed by OVERREAD, NOT ([id number]), editor PEARSON, BARBARA ([id number]) on 02/14/2013 12:32:36 PM  ECG     18-Jun-14 08:28, TEE  TEE   REASON FOR EXAM:      COMMENTS:       PROCEDURE: [facility] - ECHO TRANSESOPHAGEAL  - Feb 14 2013  8:28AM     RESULT: Transesophageal Echocardiogram Report    Patient Name:   Dorothy Gross Date of Exam: 02/14/2013  Medical Rec #:  [id number] Custom1:  Date of Birth:  09/12/1950           Height:  Patient Age:    64 years             Weight:  Patient Gender: F                    BSA:    Indications: Pre-cardioversion for atrial fibrillation.  Sonographer:    Sherrie Sport RDCS  Referring Phys: Abel Presto, J  SPECTRAL DOPPLER ANALYSIS (where applicable):    PHYSICIAN INTERPRETATION:  Left Ventricle: Global LV systolic function was moderately decreased.   Left ventricular ejection fraction, by visual estimation, is 35 to 40%.  Right Ventricle: Normal right ventricular size, wall thickness, and   systolic function. RV wall thickness is normal.  Left Atrium: The left atrium is  moderately dilated. No atrial thrombus.  Right Atrium: The right atrium is moderately dilated.  Pericardium:There is no evidence of pericardial effusion.  Mitral Valve: Moderate to severe mitral valve regurgitation is seen.  Tricuspid Valve: Moderate to severe tricuspid regurgitation is visualized.  Aortic Valve: The aortic valve is trileaflet and structurally normal,   with normal leaflet excursion; without any evidence of aortic stenosis or     insufficiency.  Pulmonic Valve: Structurally normal pulmonic valve, with normal leaflet   excursion.  Aorta: The aortic root and ascending aorta are structurally normal, with   no evidence of dilitation.  Shunts: There is no evidence of a patent foramen ovale. There is no   evidence of an atrial septal defect.    Summary:  1. Left ventricular ejection fraction, by visual estimation, is 35 to   40%.   2. Moderately decreased global left ventricular systolic function.   3. Moderately dilated left atrium.   4. Moderately dilated right atrium.   5. Moderate to severe mitral valve regurgitation.   6. Moderate to severe tricuspid regurgitation.   7. No atrial thrombus.  [place] MD  Electronically signed by [id number] Serafina Royals MD  Signature Date/Time: 02/14/2013/12:51:42 PM  *** Final ***    IMPRESSION: .        Verified By: Corey Skains  ([facility]), M.D., MD    18-Jun-14 08:40, ECG  Ventricular Rate 81  Atrial Rate 81  P-R Interval 180  QRS Duration 86  QT 408  QTc 473  P Axis 62  R Axis 88  T Axis 172  ECG interpretation   Normal sinus rhythm  Possible Left atrial enlargement  Nonspecific T wave abnormality  Prolonged QT  Abnormal ECG  When compared with ECG of 14-Feb-2013 07:17,  Sinus rhythm has replaced Atrial flutter  Nonspecific T wave abnormality, worse in Anterior leads  ----------unconfirmed----------  Confirmed by OVERREAD, NOT (100), editor PEARSON, BARBARA ([id number]) on 02/14/2013 12:28:05 PM  ECG     Assessment/Plan:  Invasive Device Daily Assessment of Necessity:  Does the patient currently have any of the following indwelling devices? none   Assessment/Plan:  Assessment IMP CHF CM Hypotension Aflutter Weakness  URI SOB Tachycardia .   Plan PLAN Transfer to tele s/p CATH TODAY D/C dilt for rate  Switch to digoxin 0.125 po qd Rehydrate slowly D/C B-blockers for rate control  She will need long term for Aflutter Proceed with cardioversion chemically vs electrically Consider TEE=cardioverion in am Add digoxin for rate control We try low dose ACE for CM /CHF   Electronic Signatures: Lujean Amel D (MD)  (Signed 18-Jun-14 15:12)  Authored: Chief Complaint, VITAL SIGNS/ANCILLARY NOTES, Brief Assessment, Lab Results, Radiology Results, Assessment/Plan   Last Updated: 18-Jun-14 15:12 by Lujean Amel D (MD)

## 2014-12-20 NOTE — Consult Note (Signed)
Chief Complaint:  Subjective/Chief Complaint Pt doing well s/p cardioversion. Denies cp improved sob. Back in sinus rhythm now.   VITAL SIGNS/ANCILLARY NOTES: **Vital Signs.:   18-Jun-14 07:49  Vital Signs Type Routine  Temperature Temperature (F) 97.8  Celsius 36.5  Temperature Source oral  Pulse Pulse 105  Respirations Respirations 20  Systolic BP Systolic BP 025  Diastolic BP (mmHg) Diastolic BP (mmHg) 852  Mean BP 114  Pulse Ox % Pulse Ox % 94  Pulse Ox Activity Level  At rest  Oxygen Delivery Room Air/ 21 %  *Intake and Output.:   Daily 18-Jun-14 07:00  Grand Totals Intake:  720 Output:  600    Net:  120 24 Hr.:  120  Oral Intake      In:  480  IV (Primary)      In:  240  Urine ml     Out:  600  Length of Stay Totals Intake:  7884.2 Output:  8905    Net:  -1020.8   Brief Assessment:  GEN well developed, well nourished, no acute distress   Cardiac Regular   Respiratory normal resp effort  clear BS   Gastrointestinal Normal   Gastrointestinal details normal Soft   EXTR negative cyanosis/clubbing, negative edema   Lab Results: Thyroid:  17-Jun-14 08:58   Thyroxine, Free 1.23 (Result(s) reported on 13 Feb 2013 at 09:49AM.)  LabObservation:  18-Jun-14 08:28   OBSERVATION Reason for Test  Cardiology:  18-Jun-14 07:17   Ventricular Rate 87  Atrial Rate 271  QRS Duration 88  QT 370  QTc 445  R Axis 78  T Axis 95  ECG interpretation Atrial flutter with variable A-V block Nonspecific ST and T wave abnormality Abnormal ECG When compared with ECG of 12-Feb-2013 11:54, Atrial flutter has replaced Sinus rhythm Vent. rate has decreased BY  44 BPM ----------unconfirmed---------- Confirmed by OVERREAD, NOT ([id number]), editor PEARSON, BARBARA ([id number]) on 02/14/2013 12:32:36 PM    08:28   TEE REASON FOR EXAM:     COMMENTS:     PROCEDURE: [facility] - ECHO TRANSESOPHAGEAL  - Feb 14 2013  8:28AM   RESULT: Transesophageal Echocardiogram Report  Patient Name:   Dorothy Gross Date of Exam: 02/14/2013 Medical Rec #:  [id number] Custom1: Date of Birth:  [birth year]           Height: Patient Age:    64 years             Weight: Patient Gender: F                    BSA:  Indications: Pre-cardioversion for atrial fibrillation. Sonographer:    Sherrie Sport RDCS Referring Phys: Abel Presto, J SPECTRAL DOPPLER ANALYSIS (where applicable):  PHYSICIAN INTERPRETATION: Left Ventricle: Global LV systolic function was moderately decreased.  Left ventricular ejection fraction, by visual estimation, is 35 to 40%. Right Ventricle: Normal right ventricular size, wall thickness, and  systolic function. RV wall thickness is normal. Left Atrium: The left atrium is moderately dilated. No atrial thrombus. Right Atrium: The right atrium is moderately dilated. Pericardium:There is no evidence of pericardial effusion. Mitral Valve: Moderate to severe mitral valve regurgitation is seen. Tricuspid Valve: Moderate to severe tricuspid regurgitation is visualized. Aortic Valve: The aortic valve is trileaflet and structurally normal,  with normal leaflet excursion; without any evidence of aortic stenosis or   insufficiency. Pulmonic Valve: Structurally normal pulmonic valve, with normal leaflet  excursion. Aorta: The aortic root and ascending aorta are structurally  normal, with  no evidence of dilitation. Shunts: There is no evidence of a patent foramen ovale. There is no  evidence of an atrial septal defect.  Summary:  1. Left ventricular ejection fraction, by visual estimation, is 35 to  40%.  2. Moderately decreased global left ventricular systolic function.  3. Moderately dilated left atrium.  4. Moderately dilated right atrium.  5. Moderate to severe mitral valve regurgitation.  6. Moderate to severe tricuspid regurgitation.  7. No atrial thrombus. [place] MD Electronically signed by [id number] Serafina Royals MD Signature Date/Time: 02/14/2013/12:51:42 PM ***  Final ***  IMPRESSION: .    Verified By: Corey Skains  (INT [facility]), M.D., MD    08:40   Ventricular Rate 81  Atrial Rate 81  P-R Interval 180  QRS Duration 86  QT 408  QTc 473  P Axis 62  R Axis 88  T Axis 172  ECG interpretation Normal sinus rhythm Possible Left atrial enlargement Nonspecific T wave abnormality Prolonged QT Abnormal ECG When compared with ECG of 14-Feb-2013 07:17, Sinus rhythm has replaced Atrial flutter Nonspecific T wave abnormality, worse in Anterior leads ----------unconfirmed---------- Confirmed by OVERREAD, NOT (100), editor PEARSON, BARBARA ([id number]) on 02/14/2013 12:28:05 PM   Radiology Results: XRay:    13-Jun-14 18:13, Chest Portable Single View  Chest Portable Single View   REASON FOR EXAM:    sob  COMMENTS:       PROCEDURE: DXR - DXR PORTABLE CHEST SINGLE VIEW  - Feb 09 2013  6:13PM     RESULT: Cardia megaly with pulmonary vascular and interstitial   prominence. Small pleural effusions noted. These findings are new from   07/18/2009.    IMPRESSION:  Congestive failure with mild pulmonary edema. Superimposed   pneumonia cannot be excluded.        Verified By: Osa Craver, M.D., MD  Cardiology:    13-Jun-14 17:44, ED ECG  Ventricular Rate 142  Atrial Rate 284  QRS Duration 80  QT 274  QTc 421  P Axis -105  R Axis 92  T Axis -44  ECG interpretation   Atrial flutter with variable A-V block  Rightward axis  Nonspecific ST and T wave abnormality  Abnormal ECG  When compared with ECG of 17-Jul-2009 10:29,  Atrial flutter has replaced Sinus rhythm  Vent. rate has increased BY  51 BPM  ST now depressedin Inferior leads  ST now depressed in Lateral leads  Nonspecific T wave abnormality now evident in Lateral leads  ----------unconfirmed----------  Confirmed by OVERREAD, NOT ([id number]), editor PEARSON, BARBARA ([id number]) on 02/12/2013 8:44:24 AM  ED ECG     14-Jun-14 09:18, Echo Doppler  Echo Doppler   REASON FOR EXAM:       COMMENTS:       PROCEDURE: [facility] - ECHO DOPPLER COMPLETE(TRANSTHOR)  - Feb 10 2013  9:18AM     RESULT: Echocardiogram Report    Patient Name:   Dorothy Gross Date of Exam: 02/10/2013  Medical Rec #:  [id number]               Custom1:  Date of Birth:  08-07-1951           Height:       64.0 in  Patient Age:    88 years             Weight:       192.0 lb  Patient Gender: F  BSA:          1.92 m??    Indications: Atrial Fib/AFLU/CHF  Sonographer:    Janalee Dane RCS  Referring Phys: Abel Presto, J    Summary:   1. Left ventricular ejection fraction, by visual estimation, is 30 to   35%.   2. Severely decreased global left ventricular systolic function.   3. Moderately reduced RV systolic function.   4. Moderate pleural effusion in the left lateral region.   5. Mild mitral valve regurgitation.   6. Moderate to severe tricuspid regurgitation.   7. Mildly increased left ventricular posterior wall thickness.  2D AND M-MODE MEASUREMENTS (normal ranges within parentheses):  Left Ventricle:          Normal  IVSd (2D):      1.17 cm (0.7-1.1)  LVPWd (2D):     1.24 cm (0.7-1.1) Aorta/LA:                  Normal  LVIDd (2D):     3.86 cm (3.4-5.7) Aortic Root (2D): 3.40 cm (2.4-3.7)  LVIDs (2D):     3.27 cm           Left Atrium (2D): 4.50 cm (1.9-4.0)  LV FS (2D):     15.3 %   (>25%)  LV EF (2D):     32.9 %   (>50%)                                    Right Ventricle:                                    RVd (2D):  LV SYSTOLIC FUNCTION BY 2D PLANIMETRY (MOD):  EF-A4C View: 33.9 % EF-A2C View: 31.8 % EF-Biplane: 35.4 %  SPECTRAL DOPPLER ANALYSIS (where applicable):  Tricuspid Valve and PA/RV Systolic Pressure: TR Max Velocity: 1.78 m/s RA   Pressure: 10 mmHg RVSP/PASP: 22.7 mmHg    PHYSICIAN INTERPRETATION:  Left Ventricle: The left ventricular internal cavity size was normal. LV   septal wall thickness was normal. LV posterior wall thickness was mildly      increased. Global LV systolic function was severely decreased. Left   ventricular ejectionfraction, by visual estimation, is 30 to 35%.  Right Ventricle: The right ventricular size is normal. Global RV systolic   function is moderately reduced.  Left Atrium: The left atrium is normal in size.  Right Atrium: The right atrium is normal in size.  Pericardium: There is no evidence of pericardial effusion. There is a   moderate pleural effusion in the left lateral region.  Mitral Valve: The mitral valve is normal in structure. Mild mitral valve   regurgitation is seen.  Tricuspid Valve: The tricuspid valve is normal. Moderate to severe   tricuspid regurgitation is visualized. The tricuspid regurgitant velocity   is 1.78 m/s, and with an assumed right atrial pressure of 10 mmHg, the   estimated right ventricular systolic pressure is normal at 22.7 mmHg.  Aortic Valve: The aortic valve is normal. The aortic valve is     structurally normal, with no evidence of sclerosis or stenosis.  Pulmonic Valve: The pulmonic valve is normal.    [place] MD  Electronically signed by [place] MD  Signature Date/Time: 02/10/2013/6:24:12 PM    *** Final ***  IMPRESSION: .        Verified By: Yolonda Kida, M.D., MD    16-Jun-14 11:54, ECG  Ventricular Rate 131  Atrial Rate 131  QRS Duration 104  QT 388  QTc 572  R Axis 95  T Axis 67  ECG interpretation   Supraventricular tachycardia  Rightward axis  Incomplete right bundle branch block  Nonspecific ST and T wave abnormality  Abnormal ECG  When compared with ECG of 09-Feb-2013 17:44,  Sinus rhythm has replaced Atrial flutter  QRS duration has increased  ST elevation has replaced ST depression in Inferior leads  ST no longer depressed in Lateral leads  ----------unconfirmed----------  Confirmed by OVERREAD, NOT (100), editor PEARSON, BARBARA ([id number]) on 02/12/2013 12:12:26 PM  ECG     18-Jun-14 07:17, ECG   Ventricular Rate 87  Atrial Rate 271  QRS Duration 88  QT 370  QTc 445  R Axis 78  T Axis 95  ECG interpretation   Atrial flutter with variable A-V block  Nonspecific ST and T wave abnormality  Abnormal ECG  When compared with ECG of 12-Feb-2013 11:54,  Atrial flutter has replaced Sinus rhythm  Vent. rate has decreased BY  44 BPM  ----------unconfirmed----------  Confirmed by OVERREAD, NOT (100), editor PEARSON, BARBARA ([id number]) on 02/14/2013 12:32:36 PM  ECG     18-Jun-14 08:28, TEE  TEE   REASON FOR EXAM:      COMMENTS:       PROCEDURE: [facility] - ECHO TRANSESOPHAGEAL  - Feb 14 2013  8:28AM     RESULT: Transesophageal Echocardiogram Report    Patient Name:   Dorothy Gross Date of Exam: 02/14/2013  Medical Rec #:  [id number] Custom1:  Date of Birth:  05-28-1951           Height:  Patient Age:    33 years             Weight:  Patient Gender: F                    BSA:    Indications: Pre-cardioversion for atrial fibrillation.  Sonographer:    Sherrie Sport RDCS  Referring Phys: Abel Presto, J  SPECTRAL DOPPLER ANALYSIS (where applicable):    PHYSICIAN INTERPRETATION:  Left Ventricle: Global LV systolic function was moderately decreased.   Left ventricular ejection fraction, by visual estimation, is 35 to 40%.  Right Ventricle: Normal right ventricular size, wall thickness, and   systolic function. RV wall thickness is normal.  Left Atrium: The left atrium is moderately dilated. No atrial thrombus.  Right Atrium: The right atrium is moderately dilated.  Pericardium:There is no evidence of pericardial effusion.  Mitral Valve: Moderate to severe mitral valve regurgitation is seen.  Tricuspid Valve: Moderate to severe tricuspid regurgitation is visualized.  Aortic Valve: The aortic valve is trileaflet and structurally normal,   with normal leaflet excursion; without any evidence of aortic stenosis or     insufficiency.  Pulmonic Valve: Structurally normal pulmonic valve, with  normal leaflet   excursion.  Aorta: The aortic root and ascending aorta are structurally normal, with   no evidence of dilitation.  Shunts: There is no evidence of a patent foramen ovale. There is no   evidence of an atrial septal defect.    Summary:   1. Left ventricular ejection fraction, by visual estimation, is 35 to   40%.   2. Moderately decreased global left ventricular systolic function.   3. Moderately dilated left atrium.  4. Moderately dilated right atrium.   5. Moderate to severe mitral valve regurgitation.   6. Moderate to severe tricuspid regurgitation.   7. No atrial thrombus.  [place] MD  Electronically signed by [id number] Serafina Royals MD  Signature Date/Time: 02/14/2013/12:51:42 PM  *** Final ***    IMPRESSION: .        Verified By: Corey Skains  (INT MED), M.D., MD    18-Jun-14 08:40, ECG  Ventricular Rate 81  Atrial Rate 81  P-R Interval 180  QRS Duration 86  QT 408  QTc 473  P Axis 62  R Axis 88  T Axis 172  ECG interpretation   Normal sinus rhythm  Possible Left atrial enlargement  Nonspecific T wave abnormality  Prolonged QT  Abnormal ECG  When compared with ECG of 14-Feb-2013 07:17,  Sinus rhythm has replaced Atrial flutter  Nonspecific T wave abnormality, worse in Anterior leads  ----------unconfirmed----------  Confirmed by OVERREAD, NOT ([id number]), editor PEARSON, BARBARA ([id number]) on 02/14/2013 12:28:05 PM  ECG    Assessment/Plan:  Invasive Device Daily Assessment of Necessity:  Does the patient currently have any of the following indwelling devices? none   Assessment/Plan:  Assessment IMP CHF Hypotension AFlutter CM Hyperlipidemia Hx HTN Recent URI S/P TEE S/P Cardioversion S/P cath with non-ischemic CM . Marland Kitchen   Plan PLAN ASA 81 mg po daily Agree with Eliquis for anticoug Rec ACE, Lisinopril '5mg'$  daily CHF/CM Coreg 6.25 mg BID CHF/CM/Rate Aminodarone for rhythm continue load 200 bid Simvastatin for chol Lasix 20  mg po daily for sob/CHF Low dose digoxin for rate / CHF Increase activity ,ambulate  OK to d/c home today F/U cardiology Monday in office RTW based on how she looks and how she feels by [facility]   Electronic Signatures: Lujean Amel D (MD)  (Signed 18-Jun-14 15:08)  Authored: Chief Complaint, VITAL SIGNS/ANCILLARY NOTES, Brief Assessment, Lab Results, Radiology Results, Assessment/Plan   Last Updated: 18-Jun-14 15:08 by Lujean Amel D (MD)

## 2014-12-20 NOTE — Consult Note (Signed)
Brief Consult Note: Diagnosis: CHF/CM/Aflu.   Patient was seen by consultant.   Consult note dictated.   Recommend further assessment or treatment.   Orders entered.   Discussed with Attending MD.   Comments: IMP CHF CM SOB Hypotension A Flut URI . PLAN ASA Lovenox ECHO Lasix Low  dose antibx Cardiac cath Monday.  Electronic Signatures: Lujean Amel D (MD)  (Signed 15-Jun-14 00:25)  Authored: Brief Consult Note   Last Updated: 15-Jun-14 00:25 by Lujean Amel D (MD)

## 2014-12-20 NOTE — Consult Note (Signed)
Chief Complaint:  Subjective/Chief Complaint Pt has had difficulty with HR and low Bp. SHE DENIES WORSENING SOB . nO cp . We will hold off on cath for today.   VITAL SIGNS/ANCILLARY NOTES: **Vital Signs.:   16-Jun-14 19:00  Vital Signs Type Post Medication  Temperature Source oral  Pulse Pulse 130  Respirations Respirations 22  Systolic BP Systolic BP 025  Diastolic BP (mmHg) Diastolic BP (mmHg) 56  Mean BP 70  Pulse Ox % Pulse Ox % 91  Oxygen Delivery Room Air/ 21 %  Pulse Ox Heart Rate 130  *Intake and Output.:   Shift 16-Jun-14 15:00  Grand Totals Intake:  800 Output:  1300    Net:  -500 53 Hr.:  -500  IV (Primary)      In:  800  Urine ml     Out:  1300  Length of Stay Totals Intake:  4495 Output:  6105    Net:  -1610   Brief Assessment:  GEN well developed, well nourished, no acute distress, critically ill appearing   Cardiac Irregular  murmur present  -- LE edema  + JVD  --Gallop   Respiratory normal resp effort  crackles   Gastrointestinal Normal   Gastrointestinal details normal Soft   EXTR negative cyanosis/clubbing, negative edema   Lab Results: Thyroid:  16-Jun-14 06:06   Thyroid Stimulating Hormone  8.19 (0.45-4.50 (International Unit)  ----------------------- Pregnant patients have  different reference  ranges for TSH:  - - - - - - - - - -  Pregnant, first trimetser:  0.36 - 2.50 uIU/mL)  Cardiology:  16-Jun-14 11:54   Ventricular Rate 131  Atrial Rate 131  QRS Duration 104  QT 388  QTc 572  R Axis 95  T Axis 67  ECG interpretation Supraventricular tachycardia Rightward axis Incomplete right bundle branch block Nonspecific ST and T wave abnormality Abnormal ECG When compared with ECG of 09-Feb-2013 17:44, Sinus rhythm has replaced Atrial flutter QRS duration has increased ST elevation has replaced ST depression in Inferior leads ST no longer depressed in Lateral leads ----------unconfirmed---------- Confirmed by OVERREAD, NOT  ([id number]), editor PEARSON, BARBARA ([id number]) on 02/12/2013 12:12:26 PM  Routine Chem:  16-Jun-14 06:06   Glucose, Serum 89  BUN  25  Creatinine (comp) 0.94  Sodium, Serum 136  Potassium, Serum 4.4  Chloride, Serum 101  CO2, Serum 30  Calcium (Total), Serum 8.8  Anion Gap  5  Osmolality (calc) 276  eGFR (African American) >60  eGFR (Non-African American) >60 (eGFR values <79m/min/1.73 m2 may be an indication of chronic kidney disease (CKD). Calculated eGFR is useful in patients with stable renal function. The eGFR calculation will not be reliable in acutely ill patients when serum creatinine is changing rapidly. It is not useful in  patients on dialysis. The eGFR calculation may not be applicable to patients at the low and high extremes of body sizes, pregnant women, and vegetarians.)  Routine Coag:  16-Jun-14 06:06   Prothrombin 14.7  INR 1.1 (INR reference interval applies to patients on anticoagulant therapy. A single INR therapeutic range for coumarins is not optimal for all indications; however, the suggested range for most indications is 2.0 - 3.0. Exceptions to the INR Reference Range may include: Prosthetic heart valves, acute myocardial infarction, prevention of myocardial infarction, and combinations of aspirin and anticoagulant. The need for a higher or lower target INR must be assessed individually. Reference: The Pharmacology and Management of the Vitamin K  antagonists: the seventh ACCP Conference on  Antithrombotic and Thrombolytic Therapy. BXIDH.6861 Sept:126 (3suppl): [id number]. A HCT value >55% may artifactually increase the PT.  In one study,  the increase was an average of 25%. Reference:  "Effect on Routine and Special Coagulation Testing Values of Citrate Anticoagulant Adjustment in Patients with High HCT Values." American Journal of Clinical Pathology 2006;126:400-405.)   Assessment/Plan:  Invasive Device Daily Assessment of Necessity:  Does the patient  currently have any of the following indwelling devices? none   Assessment/Plan:  Assessment IMP CHF CM Hypotension Aflutter Weakness  URI SOB Tachycardia .   Plan PLAN ICU Cancel cath Continue Diltiazem for rate Rehydrate slowly D/C B-blockers for rate control  She will need long term for Aflutter Proceed with cardioversion chemically vs electrically After cath consider TEE=cardioverion Add digoxin for rate control We try low dose ACE for CM /CHF   Electronic Signatures: Lujean Amel D (MD)  (Signed 17-Jun-14 23:23)  Authored: Chief Complaint, VITAL SIGNS/ANCILLARY NOTES, Brief Assessment, Lab Results, Assessment/Plan   Last Updated: 17-Jun-14 23:23 by Lujean Amel D (MD)

## 2014-12-20 NOTE — Discharge Summary (Signed)
PATIENT NAME:  Dorothy Gross, Dorothy Gross A MR#:  [id number] DATE OF BIRTH:  August 25, 1951  DATE OF ADMISSION:  02/09/2013 DATE OF DISCHARGE:  02/14/2013  ADMISSION DIAGNOSIS:  1.  New-onset atrial fibrillation/flutter.  2.  Acute congestive heart failure.   DISCHARGE DIAGNOSES: 1,  New onset atrial fibrillation/flutter now in sinus rhythm, status post cardioversion.  2.  New onset congestive heart failure, cardiomyopathy, ejection fraction of 30% to 35%.  3.  History of hyperlipidemia.   CONSULT: Dr. Clayborn Bigness.   PROCEDURES:  1.  The patient underwent cardioversion 02/14/2013.  2.  The patient underwent cardiac catheterization 02/13/2013 which showed ejection fraction of 30% with normal coronary arteries.  3.  TEE showed ejection fraction of 35% to 40% with moderate to severe mitral valve regurgitation.  5.  Troponins were negative.  6.  A 2-D echocardiogram showed ejection fraction of 30% to 35% with severely decreased left global left ventricular systolic function, mild mitral valve regurgitation and moderate to severe TR.  HOSPITAL COURSE: A very pleasant 64 year old female with the history of hyperlipidemia, who presented with shortness of breath and found to have new onset atrial fibrillation/flutter. For  further details, please refer to history of present illness.  1.  New onset atrial flutter/fibrillation. The patient was admitted to the Critical Care Unit. We had initially had trouble controlling her heart rate. She was on metoprolol as well as and  diltiazem drip. She was then loaded with digoxin and started on amiodarone. Her TSH was elevated; however, his T4 was normal. She is status post cardioversion and now in some normal sinus rhythm. I spoke with Dr. Clayborn Bigness, who recommends amiodarone, digoxin and anticoagulation for at least one month.  2.  Acute  systolic heart failure. Her ejection fraction was 30% on the echocardiogram. She underwent cardiac catheterization on the seventeenth June,  which showed normal coronary artery disease. We suspect this is related to either atrial flutter/fibrillation and/or a viral illness. She diuresed well with IV Lasix and her lungs are clear to auscultation and she required no oxygen at discharge.  3.  Hyperlipidemia. The patient will continue her statin medication.   DISCHARGE MEDICATIONS: 1.  Simvastatin 20 mg at bedtime.  2.  Multivitamin 1 tablet daily.  3.Vit D  1000 international units daily.  4.  Zinc 1 tablet daily.  5.  Eliquis 0.5 mg b.i.d. to start on Sunday 02/18/2013.  6.  Lisinopril 5 mg daily.  7.  Digoxin 0.125 mg daily.   8.  Coreg 6.25 b.i.d.  9.  Aspirin 81 mg daily.  10.  Amiodarone 200 mg b.i.d.  11.  Lasix 20 mg daily.   DISCHARGE DIET: Low-sodium, 1.5 liter water fluid restriction.   DISCHARGE ACTIVITY: No exertion or heavy lifting for one week until seen by cardiology.   FOLLOW-UP:  The patient will see Dr. Clayborn Bigness on Monday AND she may return to work after her appointment with Dr. Clayborn Bigness.  The patient is medically stable for discharge.   TIME SPENT: Approximately 35 minutes.   ____________________________ Donell Beers. Benjie Karvonen, MD spm:cc D: 02/14/2013 14:30:29 ET T: 02/14/2013 20:53:08 ET JOB#: [id number]  cc: Talon Regala P. Benjie Karvonen, MD, <Dictator> Dwayne D. Clayborn Bigness, MD Irven Easterly. Kary Kos, MD Donell Beers Shawna Kiener MD ELECTRONICALLY SIGNED 02/15/2013 13:10

## 2014-12-20 NOTE — H&P (Signed)
PATIENT NAME:  Dorothy Gross, Dorothy Gross MR#:  [id number] DATE OF BIRTH:  23-Aug-1951  DATE OF ADMISSION:  02/09/2013  PRIMARY CARE PHYSICIAN: Dorothy Easterly. Kary Kos, MD   CHIEF COMPLAINT: Shortness of breath and weakness.   HISTORY OF PRESENT ILLNESS: This is Gross 64 year old female who presents to the hospital due to shortness of breath and weakness progressively getting worse. The patient said she had Gross GI bug about 2 weeks ago. She had some nausea, vomiting and diarrhea with that, which has significantly improved. Shortly after she got over the GI bug, she developed some shortness of breath and cough, which has been nonproductive. She also said that he has been more short of breath on minimal exertion. She has also had Gross 7-pound weight gain and she has been feeling very restless and uneasy at night, not able to sleep very well due to her shortness of breath. She went to urgent care today and she was noted to be tachycardic with heart rates in the 140s. She was noted to be in new-onset atrial fibrillation and also noted to be in mild congestive heart failure. She was referred to the ER for further evaluation and admission. The patient in the ER has been given multiple doses of Cardizem without improvement in her heart rate and has been started on Gross Cardizem drip. Hospitalist services were contacted for further treatment and evaluation.   REVIEW OF SYSTEMS:    CONSTITUTIONAL: No documented fever. Positive fatigue. Positive weakness. Positive 7-pound weight gain. No weight loss.  EYES: No blurry or double vision.  EARS, NOSE, THROAT: No tinnitus. No postnasal drip. No redness of the oropharynx.  RESPIRATORY: Positive cough. No wheeze. No hemoptysis. Positive dyspnea.  CARDIOVASCULAR: No chest pain, no orthopnea, no palpitations, no syncope. Positive paroxysmal nocturnal dyspnea.  GASTROINTESTINAL: No nausea, no vomiting, no diarrhea. No abdominal pain, no melena, no hematochezia.  GENITOURINARY: No dysuria, no  hematuria.  ENDOCRINE: No polyuria or nocturia, no heat or cold intolerance.  HEMATOLOGIC: No anemia, no bruising, no bleeding.  INTEGUMENTARY: No rashes. No lesions.  MUSCULOSKELETAL: No arthritis. No swelling. No gout.  NEUROLOGIC: No numbness. No tingling. No ataxia. No seizure type activity.  PSYCHIATRIC: No anxiety, no insomnia, no ADD.   PAST MEDICAL HISTORY: Consistent with hyperlipidemia.   ALLERGIES: No known drug allergies.   SOCIAL HISTORY: Used to be Gross smoker, quit about 5 to 6 years ago, does have Gross 12 to 13 pack-year smoking history. No alcohol abuse. No illicit drug abuse. Lives at home by herself.   FAMILY HISTORY: The patient's mother is still alive. She has Gross history of high blood pressure and diabetes. Father died from complications of an MI at age 12,   CURRENT MEDICATIONS: She just is on simvastatin 20 mg daily and multivitamin daily.   PHYSICAL EXAMINATION ON ADMISSION: VITAL SIGNS: Temperature is 97.7, pulse 136, respirations 26, blood pressure 138/88, sats 96% on 2 liters via nasal cannula.  GENERAL: She is Gross pleasant-appearing female in no apparent distress.  HEENT: Atraumatic, normocephalic. Extraocular muscles are intact. Pupils equal, reactive to light. Sclerae anicteric. No conjunctival injection. No pharyngeal erythema.  NECK: Supple. There is no jugular venous distention. No bruits, no lymphadenopathy, no thyromegaly.  HEART: Irregular, tachycardic. No murmurs, no rubs, no clicks.  LUNGS: She has bibasilar crackles. Negative use of accessory muscles. No dullness to percussion. No rhonchi. No wheezing.  ABDOMEN: Soft, flat, nontender, nondistended. Has good bowel sounds. No hepatosplenomegaly appreciated.  EXTREMITIES: No evidence of any cyanosis  or clubbing. Does have +1 pitting edema from the knees to ankles bilaterally.  SKIN: Moist and warm with no rash appreciated.  LYMPHATIC: There is no cervical or axillary lymphadenopathy.  NEUROLOGIC: She is alert,  awake and oriented x 3 with no focal motor or sensory deficits appreciated bilaterally.   LABORATORY AND RADIOLOGICAL DATA: On admission showed Gross serum glucose of 109, BUN 15, creatinine 0.7, sodium 139, potassium 3.9, chloride 105, bicarbonate 24. Troponin less than 0.02. White cell count 11.7, hemoglobin 14.6, hematocrit 44.3, platelet count of 82. The patient did have Gross chest x-ray done which showed congestive failure with mild pulmonary edema.   ASSESSMENT AND PLAN: This is Gross 64 year old female with Gross history of hyperlipidemia who presented to the hospital due to shortness of breath and weakness and just not feeling well, noted to be in new-onset atrial fibrillation and also in mild congestive heart failure.  1.  Congestive heart failure: This is likely secondary to her uncontrolled atrial fibrillation, which is new-onset in nature. Questionable if this is systolic versus diastolic. I will get go ahead and get Gross 2-dimensional echocardiogram. I will gently diurese her with intravenous Lasix 20 mg every 12 hours and follow ins and outs and daily weights I will start her on low-dose beta blockers, follow hemodynamics, and if needed will start angiotensin-converting enzyme inhibitor if her blood pressure tolerates it.  2.  New-onset atrial fibrillation: The patient is clinically asymptomatic, although her rates are significantly uncontrolled. She has been given multiple boluses of intravenous Cardizem and started on Gross Cardizem drip, but the rates are still uncontrolled. I will try to give her some pulse doses of intravenous metoprolol. If not improving with that, I will consider starting her on amiodarone drip. I will start her on long-term ant with Eliquis for now. Will get Gross cardiology consult with Dr. Clayborn Gross. Also get Gross 2-dimensional echocardiogram.  3.  Hyperlipidemia: Continue simvastatin.   CODE STATUS: The patient is Gross full code.   CRITICAL CARE TIME SPENT ON ADMISSION: 50 minutes.     ____________________________ Dorothy Heman. Verdell Carmine, MD [id number] D: 02/09/2013 20:55:10 ET T: 02/09/2013 21:14:11 ET JOB#: [id number]  cc: Dorothy Heman. Verdell Carmine, MD, <Dictator> Henreitta Leber MD ELECTRONICALLY SIGNED 02/16/2013 20:40

## 2014-12-29 IMAGING — US US ABDOMEN COMPLETE
1 series · 13 of 25 positions shown · non-contrast
Comparison: None.

CLINICAL DATA: Right upper quadrant pain for 3 weeks

EXAM:
ABDOMEN ULTRASOUND COMPLETE

[Series 1: us abdomen complete · 0.21mm/px · 13 of 100 slices shown]
[im 1/100]
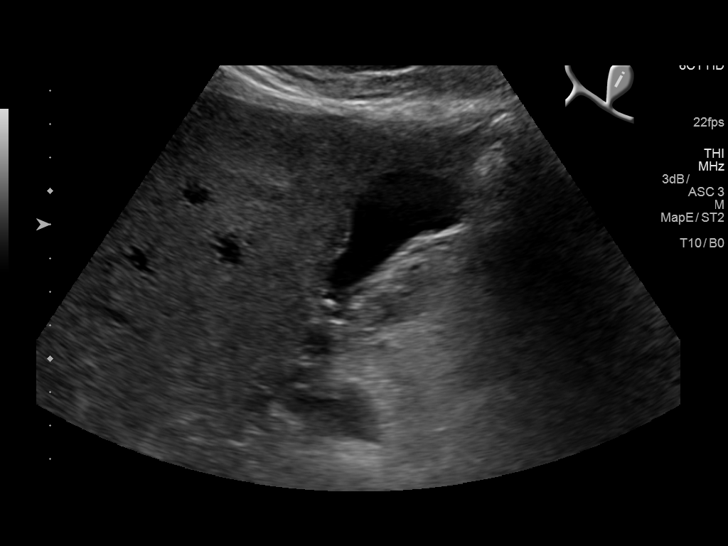
[im 9/100]
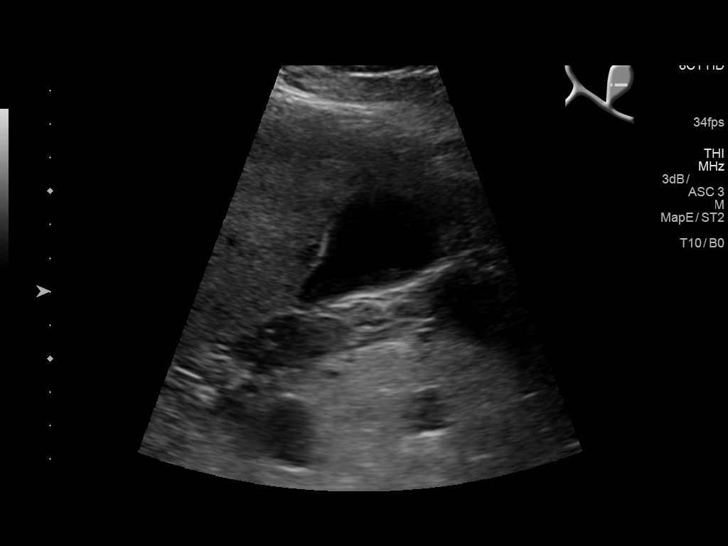
[im 17/100]
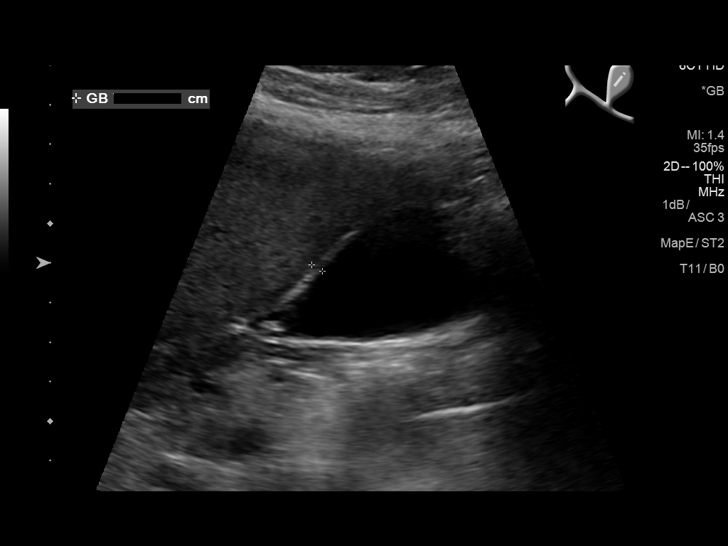
[im 25/100]
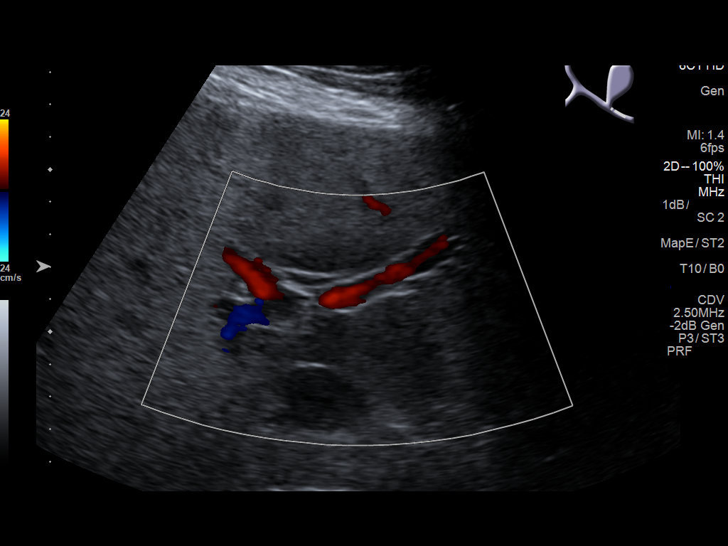
[im 34/100]
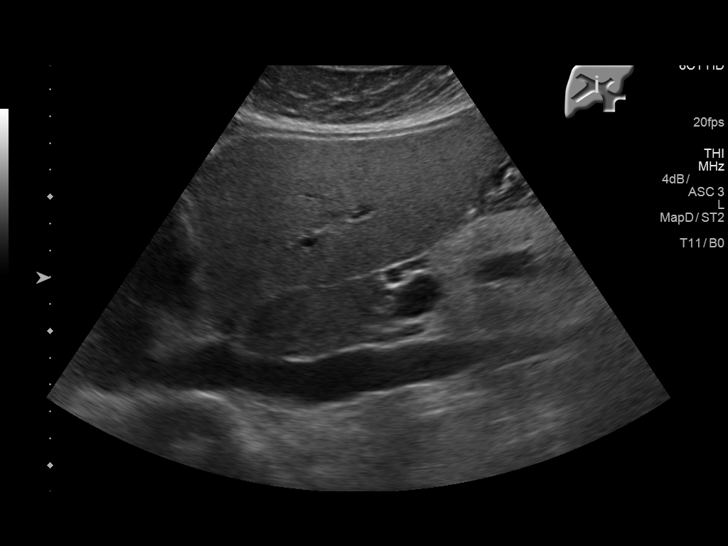
[im 42/100]
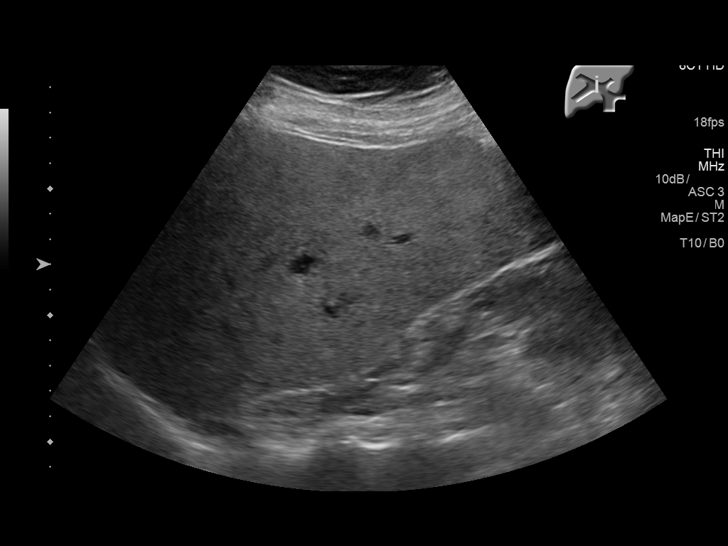
[im 50/100]
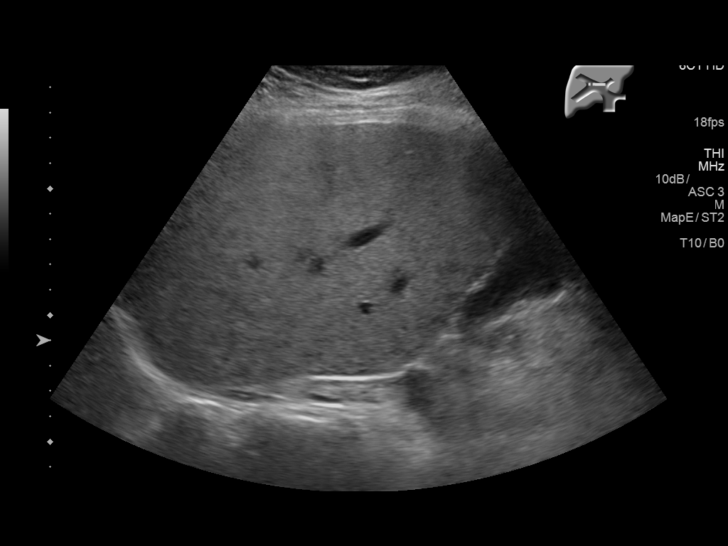
[im 58/100]
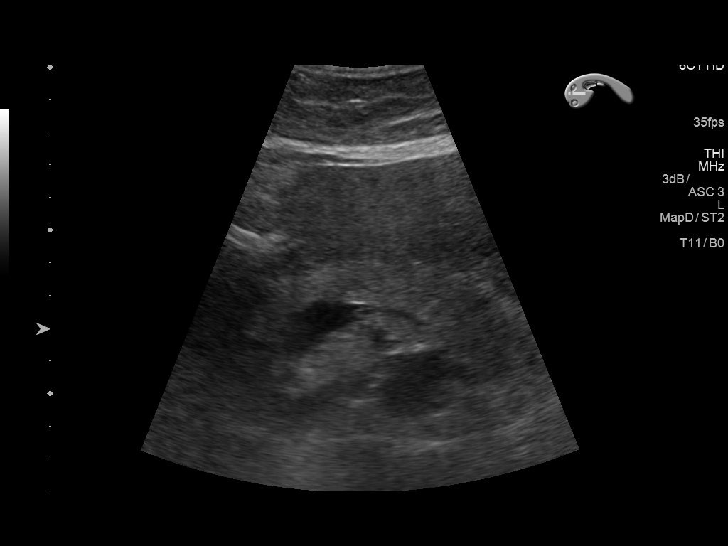
[im 67/100]
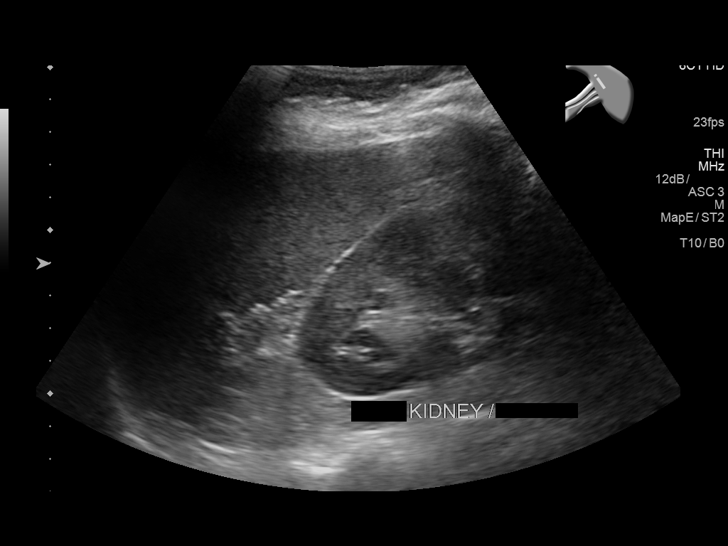
[im 75/100]
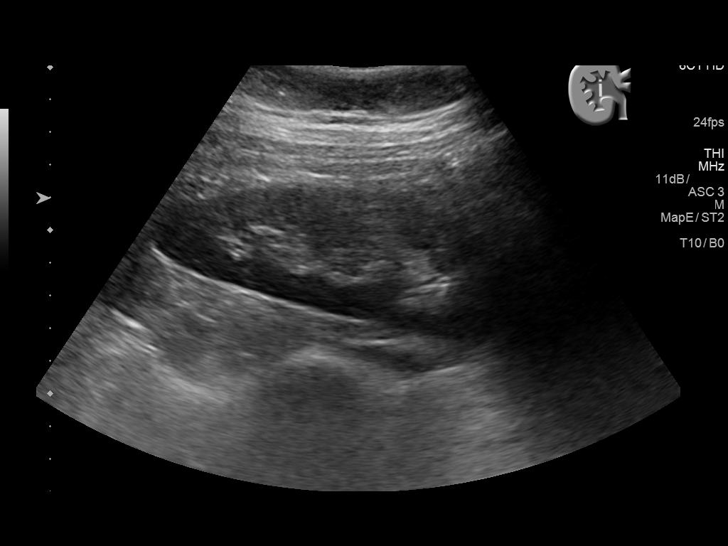
[im 83/100]
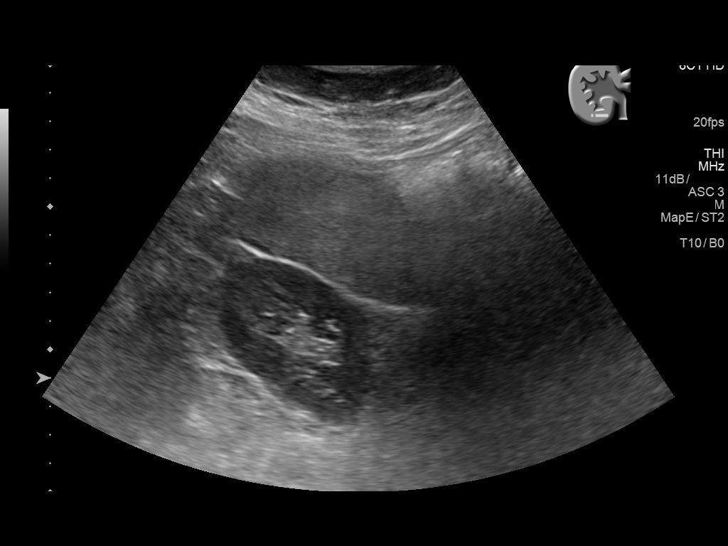
[im 91/100]
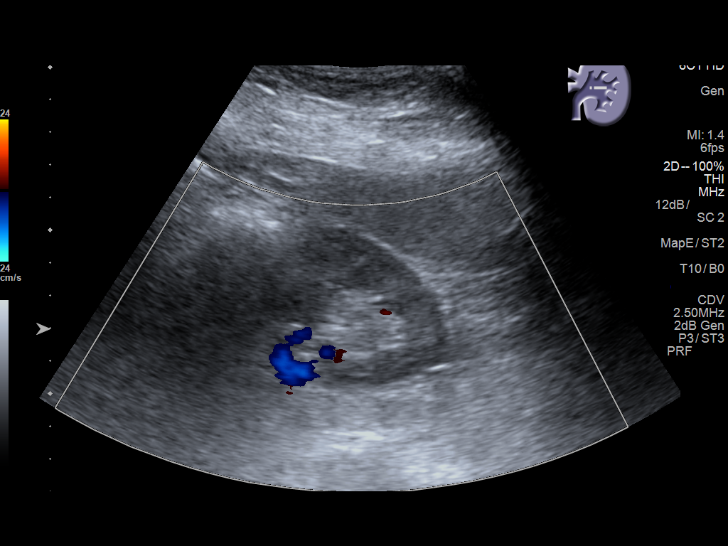
[im 100/100]
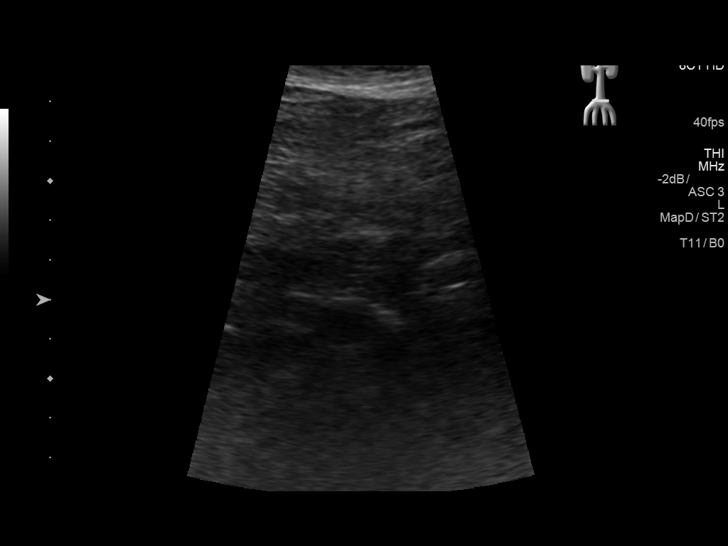

[13 of 25 positions shown; findings below may reference images not displayed]

FINDINGS: Gallbladder: The gallbladder is visualized and there is a single
gallstone which appears non mobile remaining in the neck of the
gallbladder. There is no gallbladder wall thickening currently, and
no pain over the gallbladder to indicate acute cholecystitis. No
additional calculi are seen.

Common bile duct: Diameter: The common bile duct is normal measuring
4.5 mm.

Liver: The liver is echogenic diffusely consistent with fatty
infiltration. No focal hepatic abnormality is seen.

IVC: No abnormality visualized.

Pancreas: The pancreas is moderately well seen with no abnormality
noted. The pancreatic duct is not dilated.

Spleen: The spleen measures 11.8 cm.

Right Kidney: Length: 10.8 cm..  No hydronephrosis is seen.

Left Kidney: Length: 11.9 cm..  No hydronephrosis is noted.

Abdominal aorta: The abdominal aorta is normal in caliber.

Other findings: None.
IMPRESSION: 1. There does appear to be a single gallstone of approximately 7 mm
in diameter lodged in the neck of the gallbladder. Currently there
is no pain over the gallbladder and no other ultrasound evidence of
acute cholecystitis is seen. Correlate clinically.
2. Echogenic liver parenchyma consistent with fatty infiltration.

## 2015-04-08 ENCOUNTER — Other Ambulatory Visit: Payer: Self-pay | Admitting: Family Medicine

## 2015-04-08 DIAGNOSIS — Z1231 Encounter for screening mammogram for malignant neoplasm of breast: Secondary | ICD-10-CM

## 2015-04-28 ENCOUNTER — Ambulatory Visit
Admission: RE | Admit: 2015-04-28 | Discharge: 2015-04-28 | Disposition: A | Discharge status: Home / Self Care | Payer: BLUE CROSS/BLUE SHIELD | Source: Ambulatory Visit | Attending: Family Medicine | Admitting: Family Medicine

## 2015-04-28 DIAGNOSIS — Z1231 Encounter for screening mammogram for malignant neoplasm of breast: Secondary | ICD-10-CM | POA: Insufficient documentation

## 2015-04-28 IMAGING — MG MM DIGITAL SCREENING BILATERAL
5 series · 5 of 5 positions shown · non-contrast
Comparison: Previous exam(s).

CLINICAL DATA: Screening.

EXAM:
DIGITAL SCREENING BILATERAL MAMMOGRAM WITH CAD

[L CC]
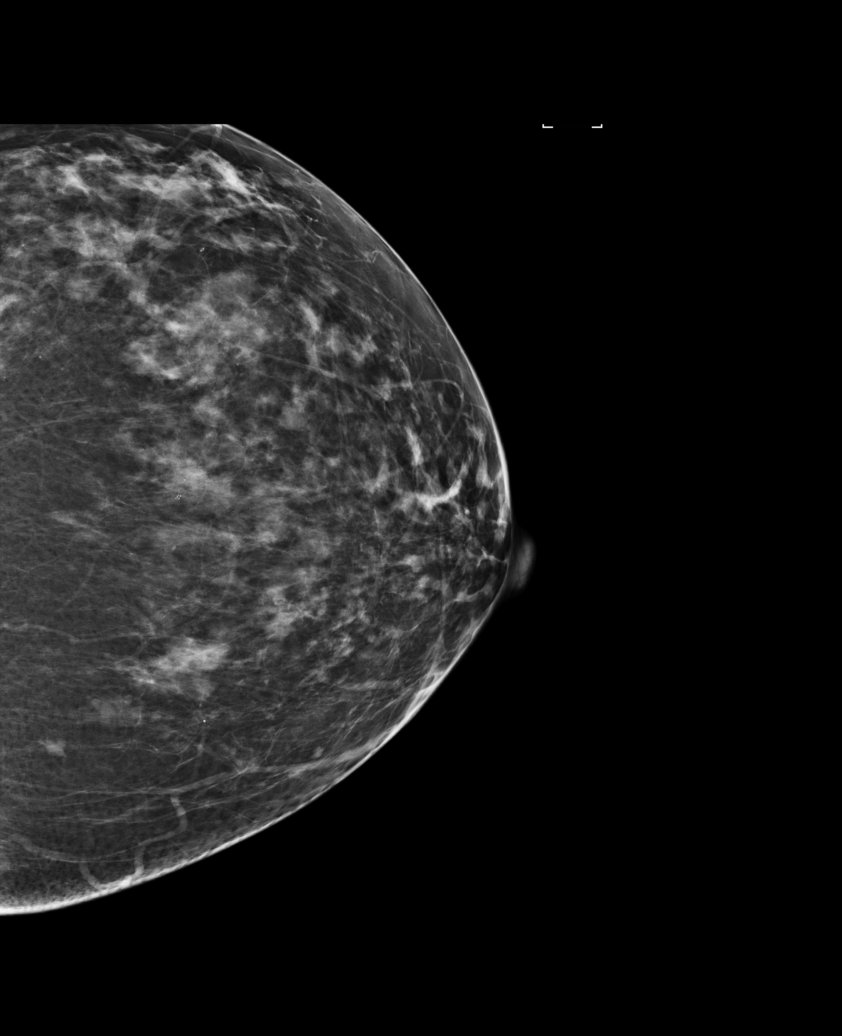

[L MLO]
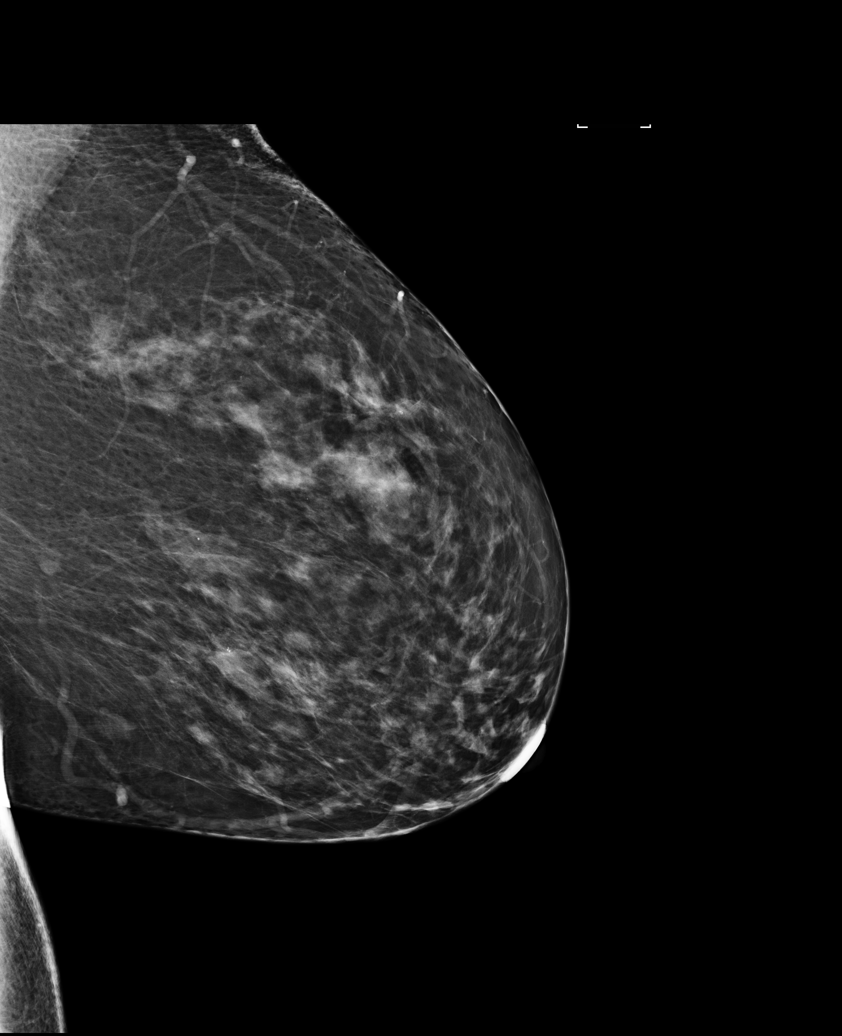

[R MLO (1 of 2)]
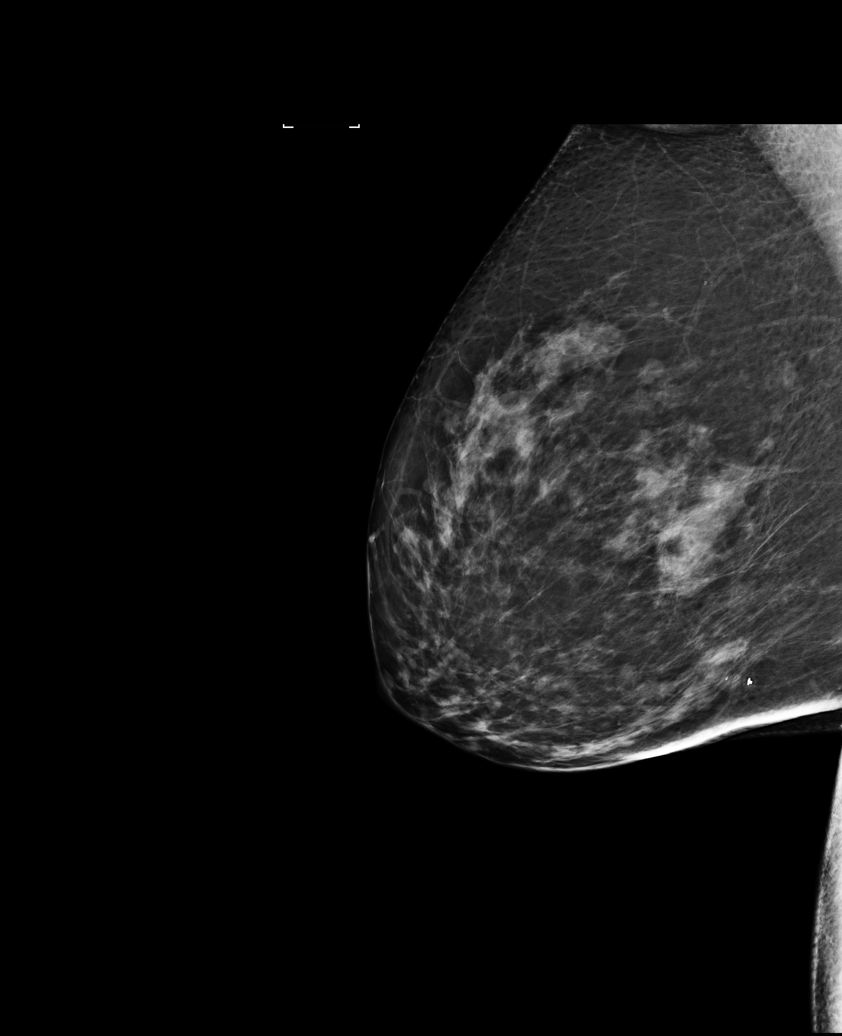

[R CC]
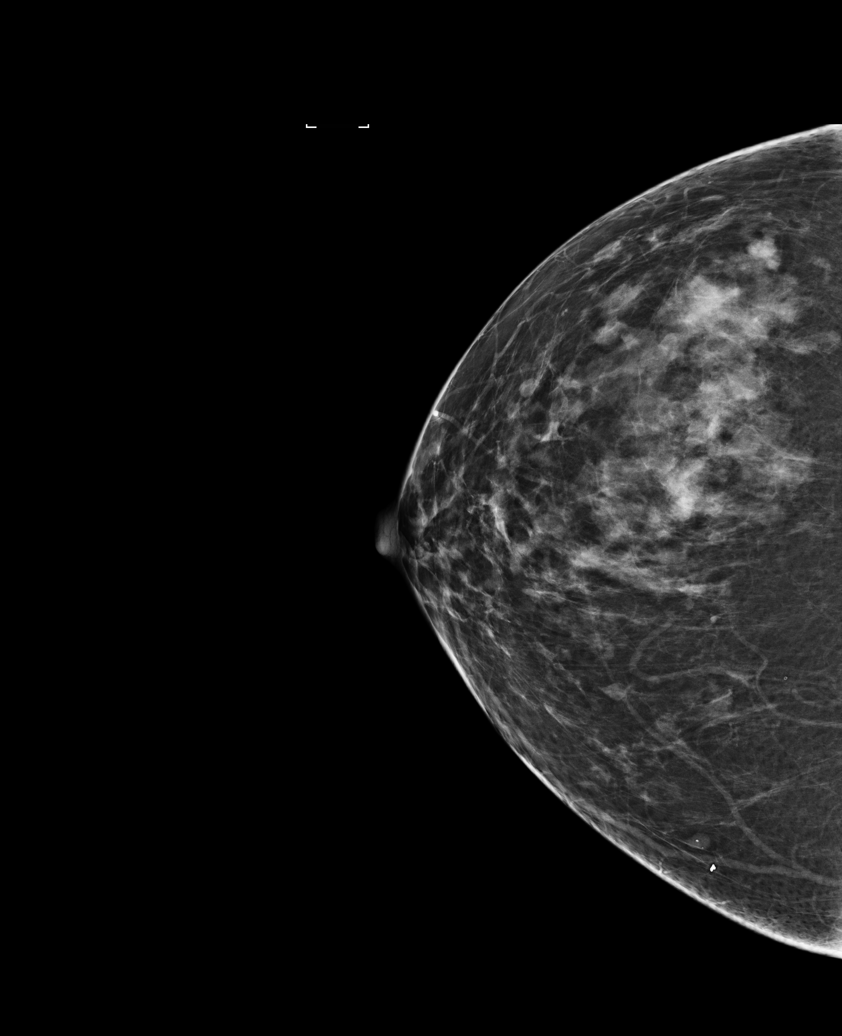

[R MLO (2 of 2)]
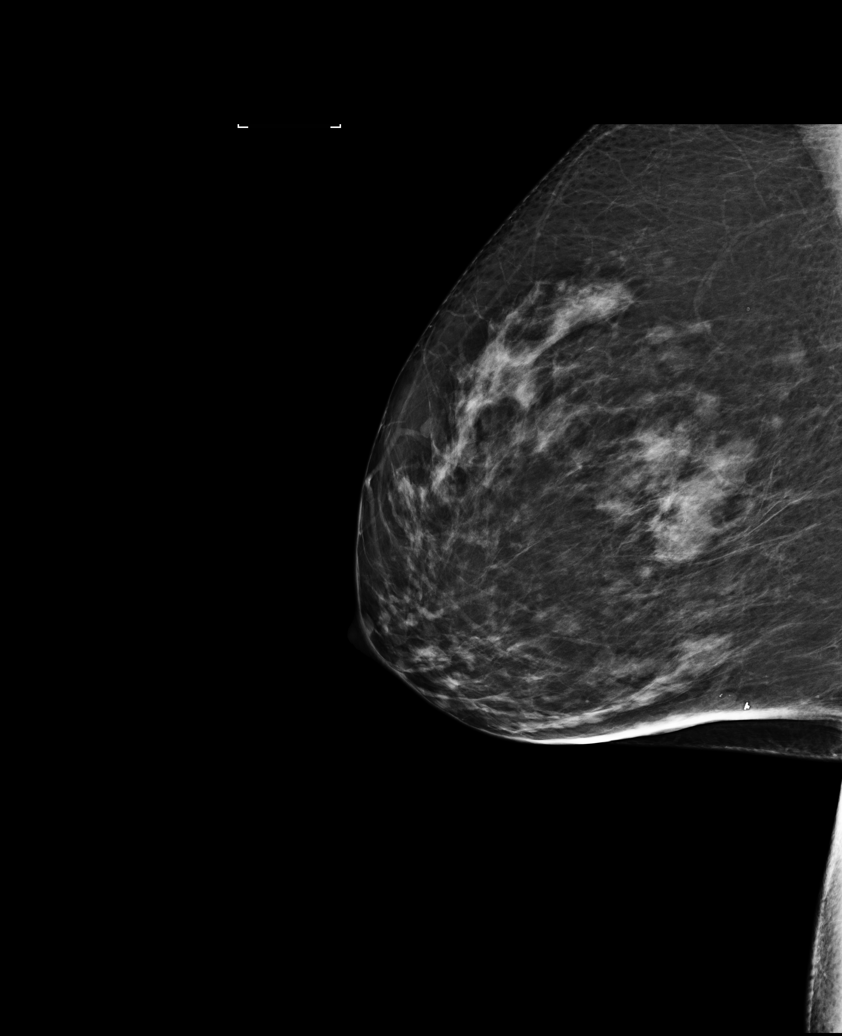

[5 of 5 positions shown; findings below may reference images not displayed]

ACR Breast Density Category c: The breast tissue is heterogeneously
dense, which may obscure small masses.
FINDINGS: There are no findings suspicious for malignancy. Images were
processed with CAD.
IMPRESSION: No mammographic evidence of malignancy. A result letter of this
screening mammogram will be mailed directly to the patient.

RECOMMENDATION:
Screening mammogram in one year. (Code:[0J])

BI-RADS CATEGORY  1: Negative.

## 2015-08-31 DIAGNOSIS — E119 Type 2 diabetes mellitus without complications: Secondary | ICD-10-CM | POA: Insufficient documentation

## 2015-09-05 ENCOUNTER — Encounter: Payer: BLUE CROSS/BLUE SHIELD | Attending: Family Medicine | Admitting: *Deleted

## 2015-09-05 ENCOUNTER — Encounter: Payer: Self-pay | Admitting: *Deleted

## 2015-09-05 VITALS — BP 120/64 | Ht 66.0 in | Wt 199.5 lb

## 2015-09-05 DIAGNOSIS — E119 Type 2 diabetes mellitus without complications: Secondary | ICD-10-CM | POA: Insufficient documentation

## 2015-09-05 NOTE — Patient Instructions (Addendum)
Check blood sugars 2 x day before breakfast and 2 hrs after supper every day or as directed by MD Exercise: Continue walking for  30  minutes  4-7  days a week Eat 3 meals day, 1 snack a day Space meals 4-6 hours apart Bring blood sugar records to the next class

## 2015-09-05 NOTE — Progress Notes (Signed)
Diabetes Self-Management Education  Visit Type: First/Initial  Appt. Start Time: 0825 Appt. End Time: 0539  09/05/2015  Ms. Murphy Oil, identified by name and date of birth, is a 65 y.o. female with a diagnosis of Diabetes: Type 2.   ASSESSMENT  Blood pressure 120/64, height '5\' 6"'$  (1.676 m), weight 199 lb 8 oz (90.493 kg). Body mass index is 32.22 kg/(m^2).      Diabetes Self-Management Education - 09/05/15 1014    Visit Information   Visit Type First/Initial   Initial Visit   Diabetes Type Type 2   Are you currently following a meal plan? Yes   What type of meal plan do you follow? chicken, fish, veggies, whole wheat, fruit, low fat dairy, no caffeine, beef and eggs 1 x week   Are you taking your medications as prescribed? Yes   Date Diagnosed 08/14/15   Health Coping   How would you rate your overall health? Good   Psychosocial Assessment   Patient Belief/Attitude about Diabetes Motivated to manage diabetes  "disappointed"   Self-care barriers None   Self-management support Doctor's office;Family   Other persons present Patient   Patient Concerns Nutrition/Meal planning;Healthy Lifestyle;Weight Control;Glycemic Control;Medication   Special Needs None   Preferred Learning Style Auditory;Visual;Hands on   [place] in progress   How often do you need to have someone help you when you read instructions, pamphlets, or other written materials from your doctor or pharmacy? 1 - Never   What is the last grade level you completed in school? High School   Complications   Last HgB A1C per patient/outside source 8.5 %  08/13/15   How often do you check your blood sugar? 1-2 times/day   Fasting Blood glucose range (mg/dL) 130-179  FBG's 130-182 mg/dL   Postprandial Blood glucose range (mg/dL) 130-179  pp's 120-140's mg/dL   Have you had a dilated eye exam in the past 12 months? Yes   Have you had a dental exam in the past 12 months? Yes   Are you checking  your feet? Yes   How many days per week are you checking your feet? 6   Dietary Intake   Breakfast egg, whole wheat toast or oatmeal or yogurt   Snack (morning) fruit or crackers/cheese or no snack   Lunch grilled chicken with veggies or salad   Snack (afternoon) fruit or crackers/cheese or no snack   Dinner meat and veggies with sweet potato   Beverage(s) water, unsweetened tea or coffee   Exercise   Exercise Type Light (walking / raking leaves)   How many days per week to you exercise? 5   How many minutes per day do you exercise? 30   Total minutes per week of exercise 150   Patient Education   Previous Diabetes Education No   Disease state  Definition of diabetes, type 1 and 2, and the diagnosis of diabetes   Nutrition management  Role of diet in the treatment of diabetes and the relationship between the three main macronutrients and blood glucose level   Physical activity and exercise  Role of exercise on diabetes management, blood pressure control and cardiac health.   Medications Reviewed patients medication for diabetes, action, purpose, timing of dose and side effects.   Monitoring Purpose and frequency of SMBG.;Identified appropriate SMBG and/or A1C goals.   Chronic complications Relationship between chronic complications and blood glucose control   Psychosocial adjustment Role of stress on diabetes;Identified and addressed patients feelings and concerns  about diabetes   Individualized Goals (developed by patient)   Reducing Risk Improve blood sugars Decrease medications Prevent diabetes complications Lose weight Lead a healthier lifestyle Become more fit   Outcomes   Expected Outcomes Demonstrated interest in learning. Expect positive outcomes      Individualized Plan for Diabetes Self-Management Training:   Learning Objective:  Patient will have a greater understanding of diabetes self-management. Patient education plan is to attend individual and/or group sessions  per assessed needs and concerns.   Plan:   Patient Instructions  Check blood sugars 2 x day before breakfast and 2 hrs after supper every day or as directed by MD Exercise: Continue walking for  30  minutes  4-7  days a week Eat 3 meals day, 1 snack a day Space meals 4-6 hours apart Bring blood sugar records to the next class   Expected Outcomes:  Demonstrated interest in learning. Expect positive outcomes  Education material provided:  General Meal Planning Guidelines Simple Meal Plan  If problems or questions, patient to contact team via:   Johny Drilling, [place], [place], CDE [phone]  Future DSME appointment:   Monday September 29, 2015 for Class 1

## 2015-09-26 IMAGING — US US CAROTID DUPLEX BILAT
1 series · 13 of 24 positions shown · non-contrast
Comparison: None.

CLINICAL DATA: Asymptomatic right carotid bruit, hypertension,
hyperlipidemia, diabetes

EXAM:
BILATERAL CAROTID DUPLEX ULTRASOUND
TECHNIQUE: Gray scale imaging, color Doppler and duplex ultrasound were
performed of bilateral carotid and vertebral arteries in the neck.

[Series 1: us carotid duplex bilat · 0.05mm/px · 13 of 64 slices shown]
[im 1/64]
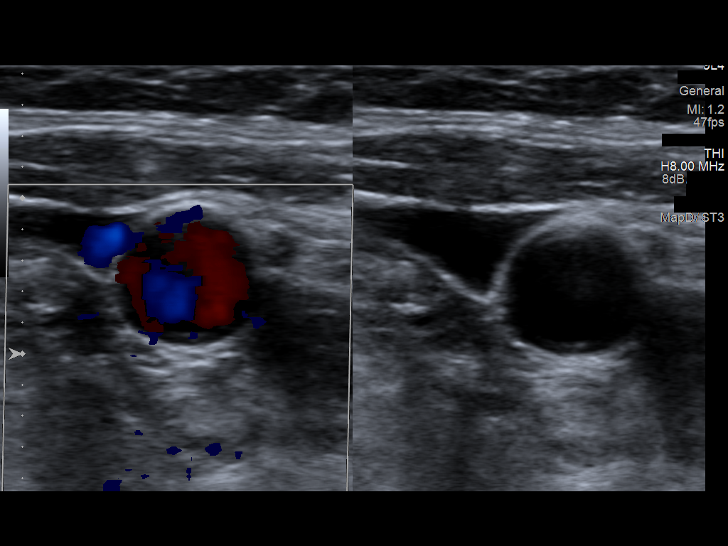
[im 6/64]
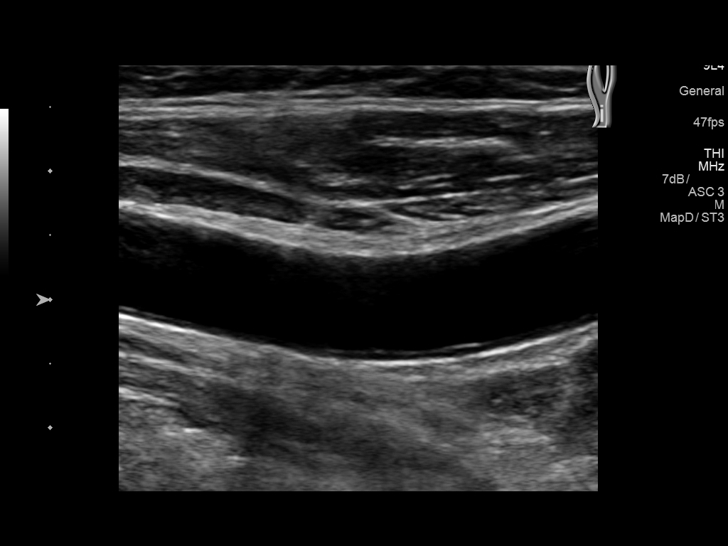
[im 11/64]
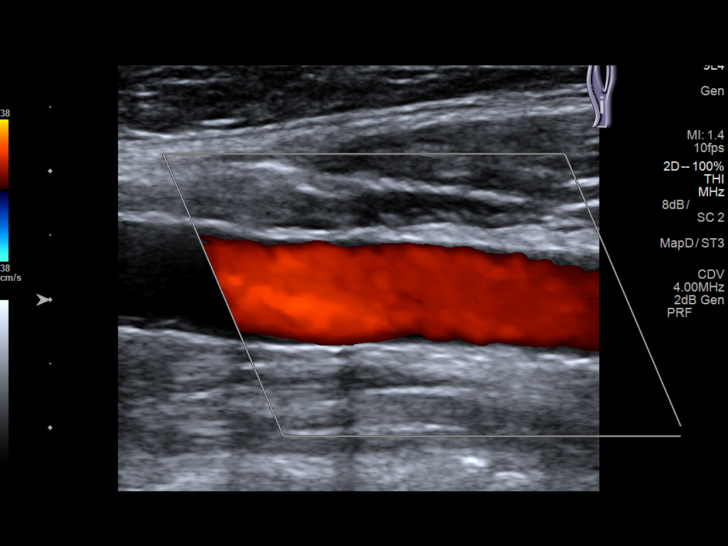
[im 17/64]
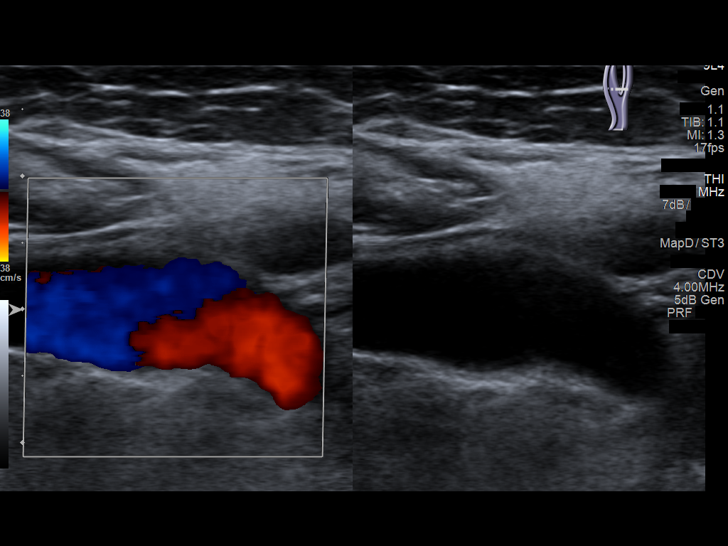
[im 22/64]
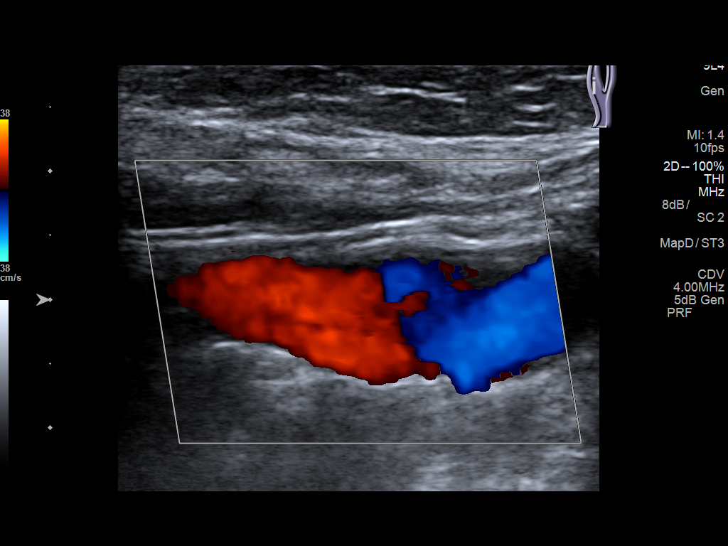
[im 28/64]
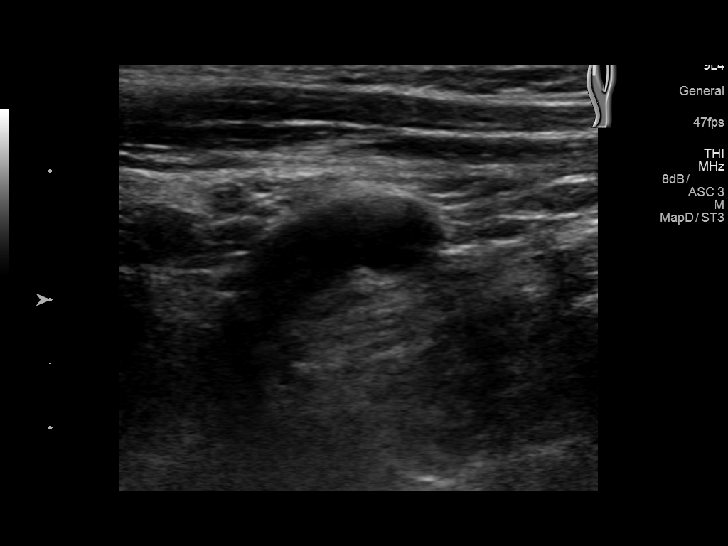
[im 33/64]
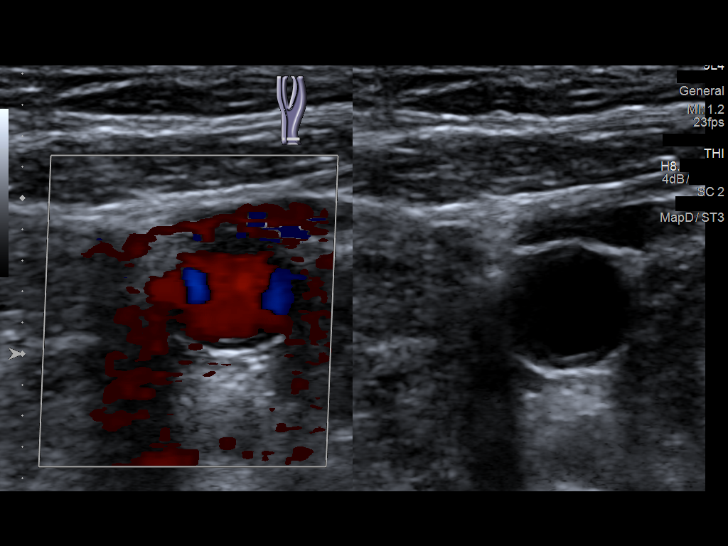
[im 36/64]
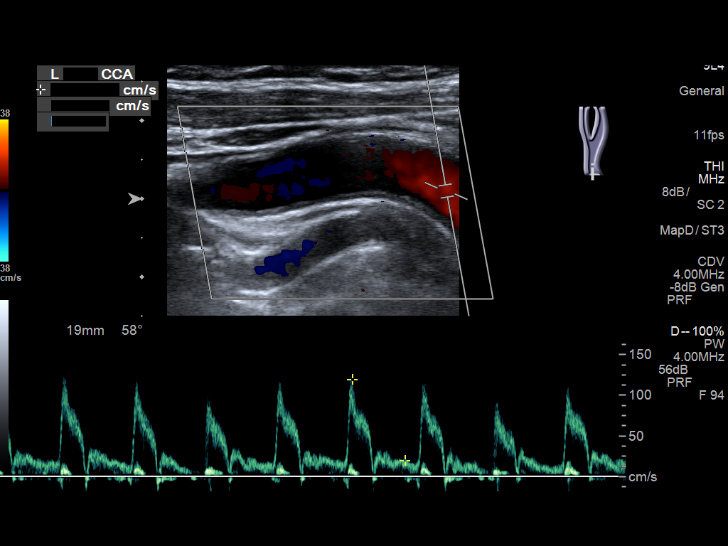
[im 42/64]
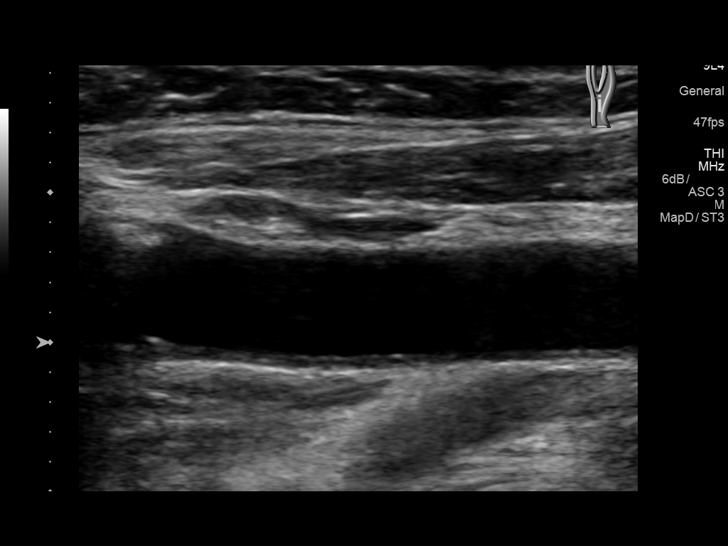
[im 47/64]
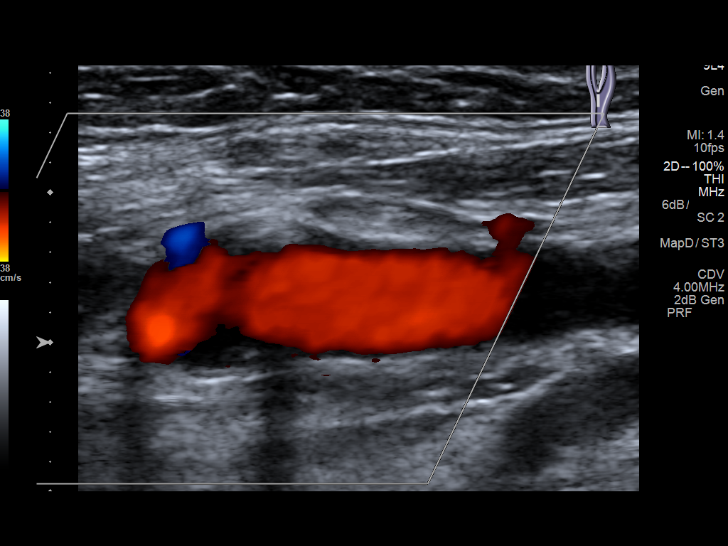
[im 53/64]
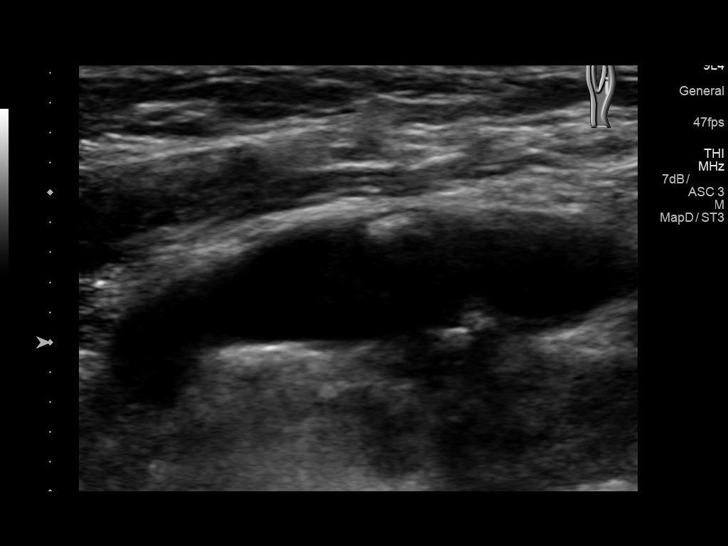
[im 58/64]
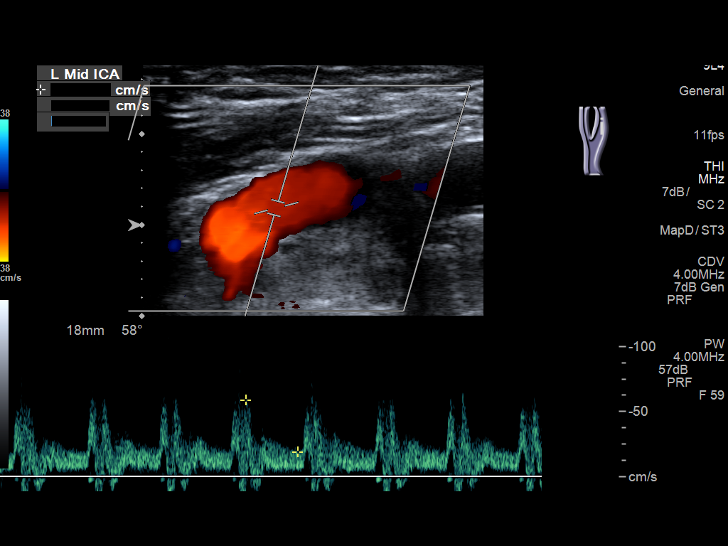
[im 64/64]
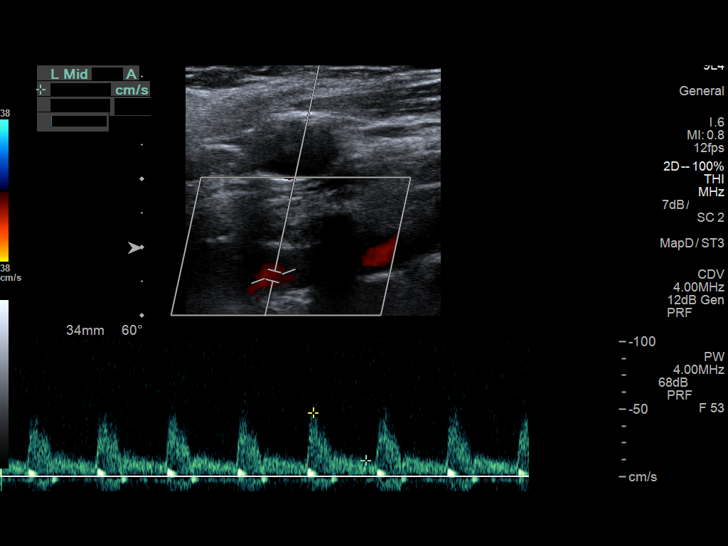

[13 of 24 positions shown; findings below may reference images not displayed]

FINDINGS: Criteria: Quantification of carotid stenosis is based on velocity
parameters that correlate the residual internal carotid diameter
with NASCET-based stenosis levels, using the diameter of the distal
internal carotid lumen as the denominator for stenosis measurement.

The following velocity measurements were obtained:

RIGHT

ICA:  89/24 cm/sec

CCA:  102/21 cm/sec

SYSTOLIC ICA/CCA RATIO:

DIASTOLIC ICA/CCA RATIO:

ECA:  120 cm/sec

LEFT

ICA:  59/19 cm/sec

CCA:  104/22 cm/sec

SYSTOLIC ICA/CCA RATIO:

DIASTOLIC ICA/CCA RATIO:

ECA:  87 cm/sec

RIGHT CAROTID ARTERY: Minor echogenic shadowing plaque formation. No
hemodynamically significant right ICA stenosis, velocity elevation,
or turbulent flow. Degree of narrowing less than 50%.

RIGHT VERTEBRAL ARTERY:  Antegrade

LEFT CAROTID ARTERY: Similar scattered minor echogenic plaque
formation. No hemodynamically significant left ICA stenosis,
velocity elevation, or turbulent flow.

LEFT VERTEBRAL ARTERY:  Antegrade
IMPRESSION: Minor carotid atherosclerosis. No hemodynamically significant ICA
stenosis. Degree of narrowing less than 50% bilaterally.

Patent antegrade vertebral flow bilaterally

## 2015-09-29 ENCOUNTER — Encounter: Payer: BLUE CROSS/BLUE SHIELD | Admitting: Dietician

## 2015-09-29 VITALS — Ht 66.0 in | Wt 198.0 lb

## 2015-09-29 DIAGNOSIS — E119 Type 2 diabetes mellitus without complications: Secondary | ICD-10-CM

## 2015-09-29 NOTE — Progress Notes (Signed)

## 2015-10-06 ENCOUNTER — Encounter: Payer: BLUE CROSS/BLUE SHIELD | Attending: Family Medicine | Admitting: Dietician

## 2015-10-06 VITALS — Ht 66.0 in | Wt 197.7 lb

## 2015-10-06 DIAGNOSIS — E119 Type 2 diabetes mellitus without complications: Secondary | ICD-10-CM | POA: Diagnosis not present

## 2015-10-06 NOTE — Progress Notes (Signed)

## 2015-10-13 ENCOUNTER — Encounter: Payer: BLUE CROSS/BLUE SHIELD | Admitting: Dietician

## 2015-10-13 VITALS — BP 148/80 | Ht 66.0 in | Wt 199.1 lb

## 2015-10-13 DIAGNOSIS — E119 Type 2 diabetes mellitus without complications: Secondary | ICD-10-CM

## 2015-10-13 NOTE — Progress Notes (Signed)

## 2015-10-15 ENCOUNTER — Encounter: Payer: Self-pay | Admitting: *Deleted

## 2015-10-31 ENCOUNTER — Other Ambulatory Visit: Payer: Self-pay | Admitting: Family Medicine

## 2015-10-31 DIAGNOSIS — R1011 Right upper quadrant pain: Secondary | ICD-10-CM

## 2015-11-05 ENCOUNTER — Other Ambulatory Visit: Payer: Self-pay | Admitting: Family Medicine

## 2015-11-05 DIAGNOSIS — R1011 Right upper quadrant pain: Secondary | ICD-10-CM

## 2015-11-06 ENCOUNTER — Ambulatory Visit: Admission: RE | Admit: 2015-11-06 | Payer: BLUE CROSS/BLUE SHIELD | Source: Ambulatory Visit

## 2015-11-07 ENCOUNTER — Ambulatory Visit: Payer: BLUE CROSS/BLUE SHIELD

## 2015-11-13 ENCOUNTER — Ambulatory Visit
Admission: RE | Admit: 2015-11-13 | Discharge: 2015-11-13 | Disposition: A | Discharge status: Home / Self Care | Payer: BLUE CROSS/BLUE SHIELD | Source: Ambulatory Visit | Attending: Family Medicine | Admitting: Family Medicine

## 2015-11-13 DIAGNOSIS — K760 Fatty (change of) liver, not elsewhere classified: Secondary | ICD-10-CM | POA: Insufficient documentation

## 2015-11-13 DIAGNOSIS — K8020 Calculus of gallbladder without cholecystitis without obstruction: Secondary | ICD-10-CM | POA: Diagnosis not present

## 2015-11-13 DIAGNOSIS — R1011 Right upper quadrant pain: Secondary | ICD-10-CM

## 2015-11-25 ENCOUNTER — Encounter
Admission: RE | Admit: 2015-11-25 | Discharge: 2015-11-25 | Disposition: A | Discharge status: Home / Self Care | Payer: BLUE CROSS/BLUE SHIELD | Source: Ambulatory Visit | Attending: Surgery | Admitting: Surgery

## 2015-11-25 DIAGNOSIS — Z029 Encounter for administrative examinations, unspecified: Secondary | ICD-10-CM | POA: Diagnosis present

## 2015-11-25 NOTE — Patient Instructions (Signed)
  Your procedure is scheduled on: 12/05/15 Report to Day Surgery. To find out your arrival time please call [phone] between 1PM - 3PM on .12/04/15  Remember: Instructions that are not followed completely may result in serious medical risk, up to and including death, or upon the discretion of your surgeon and anesthesiologist your surgery may need to be rescheduled.    _x___ 1. Do not eat food or drink liquids after midnight. No gum chewing or hard candies.     __x__ 2. No Alcohol for 24 hours before or after surgery.   ____ 3. Bring all medications with you on the day of surgery if instructed.    _x___ 4. Notify your doctor if there is any change in your medical condition     (cold, fever, infections).     Do not wear jewelry, make-up, hairpins, clips or nail polish.  Do not wear lotions, powders, or perfumes. You may wear deodorant.  Do not shave 48 hours prior to surgery. Men may shave face and neck.  Do not bring valuables to the hospital.    [facility] is not responsible for any belongings or valuables.               Contacts, dentures or bridgework may not be worn into surgery.  Leave your suitcase in the car. After surgery it may be brought to your room.  For patients admitted to the hospital, discharge time is determined by your                treatment team.   Patients discharged the day of surgery will not be allowed to drive home.   Please read over the following fact sheets that you were given:   Surgical Site Infection Prevention   ____ Take these medicines the morning of surgery with A SIP OF WATER:    1. carvedilol  2. lorsartan  3.   4.  5.  6.  ____ Fleet Enema (as directed)   __x__ Use CHG Soap as directed  ____ Use inhalers on the day of surgery  ____ Stop metformin 2 days prior to surgery    ____ Take 1/2 of usual insulin dose the night before surgery and none on the morning of surgery.   __x__ Stop eloquist ans Aspirin on 3/31 per Dr.  Jerrye Beavers  ____ Stop Anti-inflammatories on    __x__ Stop supplements until after surgery.    ____ Bring C-Pap to the hospital.

## 2015-11-25 NOTE — Pre-Procedure Instructions (Signed)
Dr. Donnamarie Rossetti office notified that we need cardiac clearance after tomorrow's visit and EKG.  Faxed request and called office

## 2015-12-03 NOTE — Pre-Procedure Instructions (Signed)
ANESTHESIA - MOST RECENT ECHO 11/28/15    INTERNAL MEDICINE [facility] [id number]   [facility]: [id number]  [address], [place] [address]: 11/28/2015 08:22 AM   Adult Female Age: 65 yrs  ECHOCARDIOGRAM REPORTOutpatient  STUDY:CHEST WALL TAPE:0000:00: 0:00:00 KC::KCWI   ECHO:Yes DOPPLER:YesFILE:0000-000-000 MD1:CALLWOOD, DWAYNE DENNIS  COLOR:YesCONTRAST:No MACHINE:Philips Height: 65 in  RV BIOPSY:No 3D:NoSOUND QLTY:ModerateWeight: 196 lb   MEDIUM:None   BSA: 1.9 m2  ___________________________________________________________________________________________   HISTORY:DOE  REASON:Assess, LV function  INDICATION:Acute systolic congestive heart failure (CMS-HCC) [I50.21 (ICD-1, CHF with   cardiomyopathy (CMS-HCC) [I50.9, I42.9 (ICD-10-CM)]    ___________________________________________________________________________________________  ECHOCARDIOGRAPHIC MEASUREMENTS  2D DIMENSIONS  AORTA ValuesNormal RangeMAIN PAValuesNormal Range  Annulus:nm* [2.1 - 2.5]PA Main:nm* [1.5 - 2.1]  Aorta Sin:nm* [2.7 - 3.3] RIGHT VENTRICLE  ST Junction:nm* [2.3 - 2.9]RV Base:nm* [ < 4.2]  Asc.Aorta:nm* [2.3 - 3.1] RV Mid:nm* [ < 3.5]  LEFT VENTRICLERV Length:nm* [ < 8.6]  LVIDd:4.5  cm[3.9 - 5.3] INFERIOR VENA CAVA  LVIDs:2.8 cmMax. IVC:nm* [ <= 2.1]   FS:37.8 %[> 25]Min. IVC:nm*  SWT:1.1 cm[0.5 - 0.9] ------------------  PWT:1.2 cm[0.5 - 0.9] nm* - not measured  LEFT ATRIUM  LA Diam:4.4 cm[2.7 - 3.8]  LA A4C Area:nm* [ < 20]  LA Volume:nm* [22 - 52]    ___________________________________________________________________________________________  ECHOCARDIOGRAPHIC DESCRIPTIONS    AORTIC ROOT  Size:Normal  Dissection:INDETERM FOR DISSECTION    AORTIC VALVE  Leaflets:Tricuspid Morphology:Normal  Mobility:Fully mobile    LEFT VENTRICLE  Size:NormalAnterior:Normal  Contraction:Normal Lateral:Normal  Closest EF:>55% (Estimated)Septal:Normal   LV Masses:No Masses Apical:Normal   PXT:GGYIRSWNIOEV:OJJKKX  Posterior:Normal  Dias.FxClass:(Grade 2) relaxation abnormal, pseudonormal    MITRAL VALVE  Leaflets:NormalMobility:Fully mobile  Morphology:THICKENED LEAFLET(S)    LEFT ATRIUM  Size:MILDLY ENLARGEDLA Masses:No masses   IA Septum:Normal IAS    MAIN PA  Size:Normal    PULMONIC VALVE  Morphology:NormalMobility:Fully mobile    RIGHT VENTRICLE   RV Masses:No Masses Size:MILDLY ENLARGED   Free Wall:Normal Contraction:Normal    TRICUSPID VALVE  Leaflets:NormalMobility:Fully mobile  Morphology:Normal    RIGHT ATRIUM  Size:NormalRA Other:None   RA Mass:No  masses    PERICARDIUM   Fluid:No effusion    INFERIOR VENACAVA  Size:Normal Normal respiratory collapse      ____________________________________________________________________  DOPPLER ECHO and OTHER SPECIAL PROCEDURES   Aortic:MILD AR No AS  167.0 cm/sec peak vel 11.2 mmHg peak grad  6.0 mmHg mean grad     Mitral:TRIVIAL MRNo MS  MV Inflow E Vel=98.7 cm/sec MV Annulus E'Vel=6.7 cm/sec  E/E'Ratio=14.7    Tricuspid:MILD TR No TS  294.0 cm/sec peak TR vel39.6 mmHg peak RV pressure    Pulmonary:TRIVIAL PRNo PS          ___________________________________________________________________________________________  INTERPRETATION  NORMAL LEFT VENTRICULAR SYSTOLIC FUNCTION  NORMAL RIGHT VENTRICULAR SYSTOLIC FUNCTION  MILD VALVULAR REGURGITATION (See above)  NO VALVULAR STENOSIS  Ejection Fraction > 60%      ___________________________________________________________________________________________  Electronically signed by: Lujean Amel, MD on 12/02/2015 10:04 AM  Performed By: Scherrie November, RCS  Ordering Physician: Lujean Amel DENNIS    ___________________________________________________________________________________________    Echocardiogram 2D complete (11/28/2015 8:57 AM)  Procedure Note  Interface, Text Results In - 12/02/2015 10:05 AM EDT  INTERNAL MEDICINE DEPARTMENT JAMEISHA, ST[place] [id number] [facility] Acct #: [id number] [address] [place], [place] [address] Date: 11/28/2015 08:22 AM Adult Female Age: 62 yrs ECHOCARDIOGRAM REPORT Outpatient STUDY:CHEST WALL TAPE:0000:00: 0:00:00 KC::KCWI ECHO:Yes DOPPLER:Yes FILE:0000-000-000 MD1:  CALLWOOD, DWAYNE DENNIS COLOR:Yes CONTRAST:No  MACHINE:Philips Height: 110 in RV BIOPSY:No 3D:No SOUND QLTY:Moderate Weight: 196 lb MEDIUM:None BSA: 1.9 m2 ___________________________________________________________________________________________ HISTORY:DOE REASON:Assess, LV function INDICATION:Acute systolic congestive heart failure (CMS-HCC) [I50.21 (ICD-1, CHF with cardiomyopathy (CMS-HCC) [I50.9, I42.9 (ICD-10-CM)]  ___________________________________________________________________________________________ ECHOCARDIOGRAPHIC MEASUREMENTS 2D DIMENSIONS AORTA Values Normal Range MAIN PA Values Normal Range Annulus: nm* [2.1 - 2.5] PA Main: nm* [1.5 - 2.1] Aorta Sin: nm* [2.7 - 3.3] RIGHT VENTRICLE ST Junction: nm* [2.3 - 2.9] RV Base: nm* [ < 4.2] Asc.Aorta: nm* [2.3 - 3.1] RV Mid: nm* [ < 3.5] LEFT VENTRICLE RV Length: nm* [ < 8.6] LVIDd: 4.5 cm [3.9 - 5.3] INFERIOR VENA CAVA LVIDs: 2.8 cm Max. IVC: nm* [ <= 2.1] FS: 37.8 % [> 25] Min. IVC: nm* SWT: 1.1 cm [0.5 - 0.9] ------------------ PWT: 1.2 cm [0.5 - 0.9] nm* - not measured LEFT ATRIUM LA Diam: 4.4 cm [2.7 - 3.8] LA A4C Area: nm* [ < 20] LA Volume: nm* [22 - 52]  ___________________________________________________________________________________________ ECHOCARDIOGRAPHIC DESCRIPTIONS  AORTIC ROOT Size:Normal Dissection:INDETERM FOR DISSECTION  AORTIC VALVE Leaflets:Tricuspid Morphology:Normal Mobility:Fully mobile  LEFT VENTRICLE Size:Normal Anterior:Normal Contraction:Normal Lateral:Normal Closest EF:>55% (Estimated) Septal:Normal LV Masses:No Masses Apical:Normal BUL:AGTX Inferior:Normal Posterior:Normal Dias.FxClass:(Grade 2) relaxation abnormal, pseudonormal  MITRAL VALVE Leaflets:Normal Mobility:Fully mobile Morphology:THICKENED LEAFLET(S)  LEFT ATRIUM Size:MILDLY ENLARGED LA Masses:No masses IA Septum:Normal IAS  MAIN PA Size:Normal  PULMONIC VALVE Morphology:Normal Mobility:Fully mobile  RIGHT VENTRICLE RV Masses:No Masses Size:MILDLY  ENLARGED Free Wall:Normal Contraction:Normal  TRICUSPID VALVE Leaflets:Normal Mobility:Fully mobile Morphology:Normal  RIGHT ATRIUM Size:Normal RA Other:None RA Mass:No masses  PERICARDIUM Fluid:No effusion  INFERIOR VENACAVA Size:Normal Normal respiratory collapse   ____________________________________________________________________ DOPPLER ECHO and OTHER SPECIAL PROCEDURES Aortic:MILD AR No AS 167.0 cm/sec peak vel 11.2 mmHg peak grad 6.0 mmHg mean grad  Mitral:TRIVIAL MR No MS MV Inflow E Vel=98.7 cm/sec MV Annulus E'Vel=6.7 cm/sec E/E'Ratio=14.7  Tricuspid:MILD TR No TS 294.0 cm/sec peak TR vel 39.6 mmHg peak RV pressure  Pulmonary:TRIVIAL PR No PS     ___________________________________________________________________________________________ INTERPRETATION NORMAL LEFT VENTRICULAR SYSTOLIC FUNCTION NORMAL RIGHT VENTRICULAR SYSTOLIC FUNCTION MILD VALVULAR REGURGITATION (See above) NO VALVULAR STENOSIS Ejection Fraction > 60%   ___________________________________________________________________________________________ Electronically signed by: Lujean Amel, MD on 12/02/2015 10:04 AM Performed By: Scherrie November, RCS Ordering Physician: Lujean Amel DENNIS  ___________________________________________________________________________________________   Back to top of Results

## 2015-12-05 ENCOUNTER — Ambulatory Visit: Payer: BLUE CROSS/BLUE SHIELD | Admitting: Certified Registered"

## 2015-12-05 ENCOUNTER — Ambulatory Visit
Admission: RE | Admit: 2015-12-05 | Discharge: 2015-12-05 | Disposition: A | Discharge status: Home / Self Care | Payer: BLUE CROSS/BLUE SHIELD | Source: Ambulatory Visit | Attending: Surgery | Admitting: Surgery

## 2015-12-05 ENCOUNTER — Ambulatory Visit: Payer: BLUE CROSS/BLUE SHIELD

## 2015-12-05 ENCOUNTER — Encounter
Admission: RE | Disposition: A | Discharge status: Home / Self Care | Payer: Self-pay | Source: Ambulatory Visit | Attending: Surgery

## 2015-12-05 ENCOUNTER — Encounter: Payer: Self-pay | Admitting: *Deleted

## 2015-12-05 DIAGNOSIS — R1011 Right upper quadrant pain: Secondary | ICD-10-CM | POA: Diagnosis not present

## 2015-12-05 DIAGNOSIS — M545 Low back pain: Secondary | ICD-10-CM | POA: Diagnosis not present

## 2015-12-05 DIAGNOSIS — I2510 Atherosclerotic heart disease of native coronary artery without angina pectoris: Secondary | ICD-10-CM | POA: Diagnosis not present

## 2015-12-05 DIAGNOSIS — I110 Hypertensive heart disease with heart failure: Secondary | ICD-10-CM | POA: Diagnosis not present

## 2015-12-05 DIAGNOSIS — I429 Cardiomyopathy, unspecified: Secondary | ICD-10-CM | POA: Insufficient documentation

## 2015-12-05 DIAGNOSIS — K8010 Calculus of gallbladder with chronic cholecystitis without obstruction: Secondary | ICD-10-CM | POA: Insufficient documentation

## 2015-12-05 DIAGNOSIS — I509 Heart failure, unspecified: Secondary | ICD-10-CM | POA: Insufficient documentation

## 2015-12-05 DIAGNOSIS — E669 Obesity, unspecified: Secondary | ICD-10-CM | POA: Diagnosis not present

## 2015-12-05 DIAGNOSIS — E119 Type 2 diabetes mellitus without complications: Secondary | ICD-10-CM | POA: Insufficient documentation

## 2015-12-05 DIAGNOSIS — Z90710 Acquired absence of both cervix and uterus: Secondary | ICD-10-CM | POA: Diagnosis not present

## 2015-12-05 DIAGNOSIS — M1990 Unspecified osteoarthritis, unspecified site: Secondary | ICD-10-CM | POA: Diagnosis not present

## 2015-12-05 DIAGNOSIS — E785 Hyperlipidemia, unspecified: Secondary | ICD-10-CM | POA: Insufficient documentation

## 2015-12-05 DIAGNOSIS — Z87891 Personal history of nicotine dependence: Secondary | ICD-10-CM | POA: Diagnosis not present

## 2015-12-05 DIAGNOSIS — K8020 Calculus of gallbladder without cholecystitis without obstruction: Secondary | ICD-10-CM

## 2015-12-05 DIAGNOSIS — I4891 Unspecified atrial fibrillation: Secondary | ICD-10-CM | POA: Insufficient documentation

## 2015-12-05 HISTORY — PX: CHOLECYSTECTOMY: SHX55

## 2015-12-05 LAB — GLUCOSE, CAPILLARY
GLUCOSE-CAPILLARY: 118 mg/dL — AB (ref 65–99)
GLUCOSE-CAPILLARY: 149 mg/dL — AB (ref 65–99)

## 2015-12-05 IMAGING — CR DG CHOLANGIOGRAM OPERATIVE
1 series · 3 of 3 positions shown · non-contrast
Comparison: [DATE]

CLINICAL DATA: Cholelithiasis, intraoperative cholangiogram during
the laparoscopic procedure

EXAM:
INTRAOPERATIVE CHOLANGIOGRAM
TECHNIQUE: Cholangiographic images from the C-arm fluoroscopic device were
submitted for interpretation post-operatively. Please see the
procedural report for the amount of contrast and the fluoroscopy
time utilized.

[Series 2: cont. · 3 of 3 frames shown]
[frame 1/3]
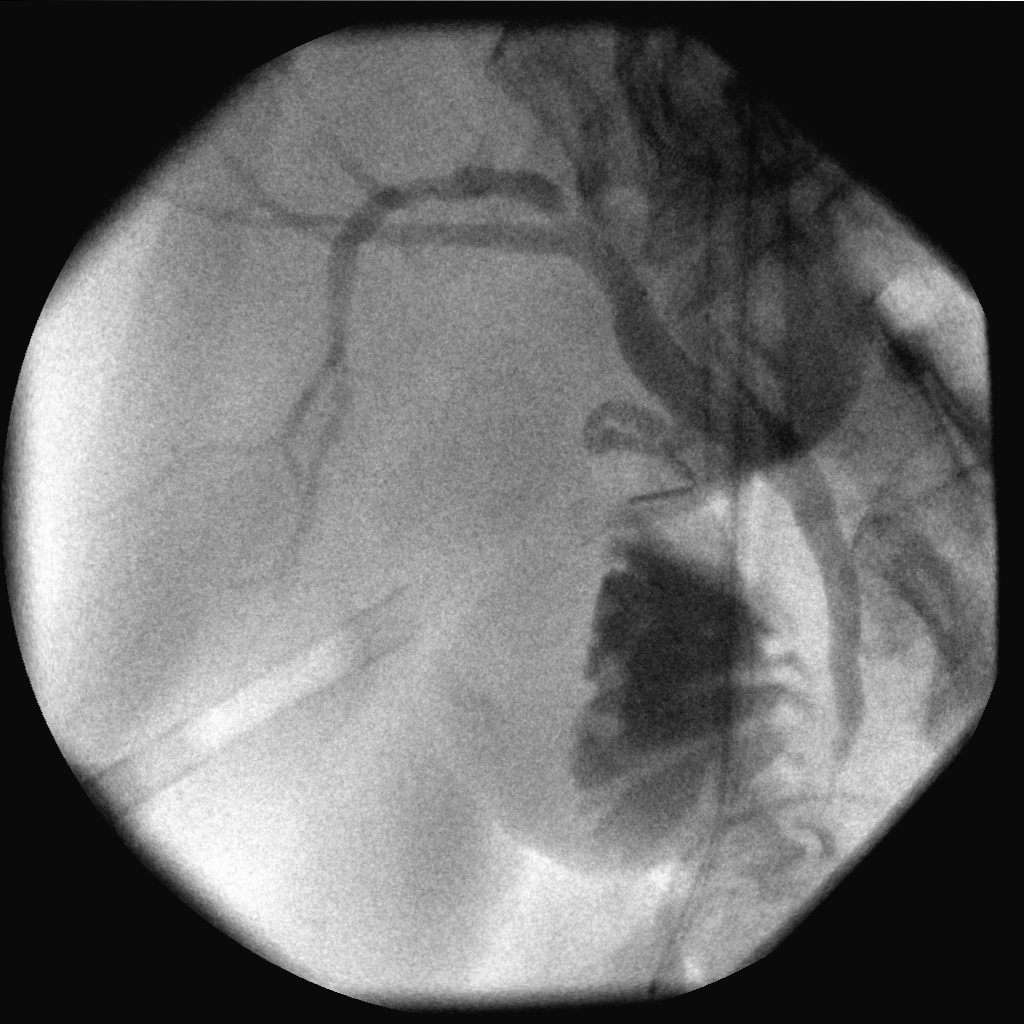
[frame 2/3]
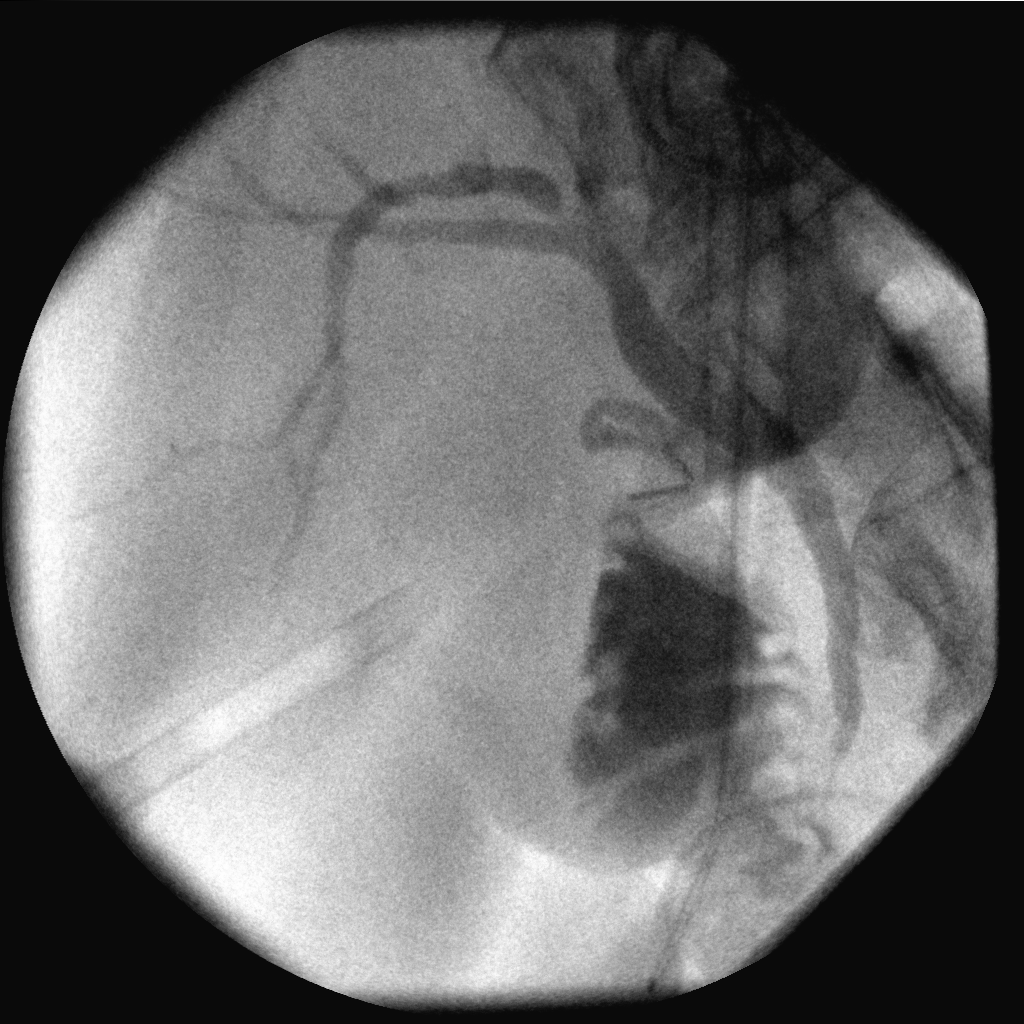
[frame 3/3]
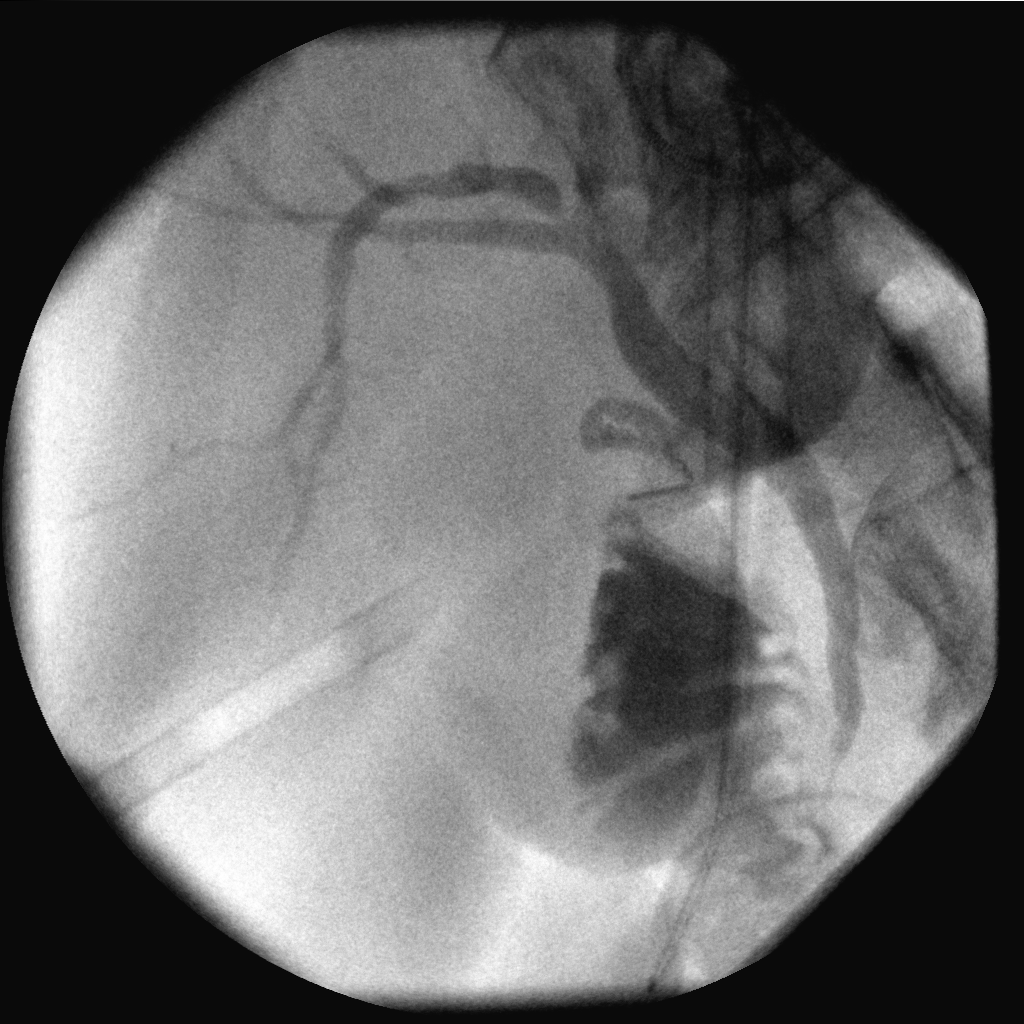

[3 of 3 positions shown; findings below may reference images not displayed]

FINDINGS: limited intraoperative cholangiogram performed during the
laparoscopic cholecystectomy.

The biliary confluence, common hepatic duct, cystic duct and common
bile duct appear patent. No dilatation or obstruction. Contrast
drains into the duodenum.
IMPRESSION: Patent biliary system.

## 2015-12-05 SURGERY — LAPAROSCOPIC CHOLECYSTECTOMY WITH INTRAOPERATIVE CHOLANGIOGRAM
Anesthesia: General | Site: Abdomen | Wound class: Clean Contaminated

## 2015-12-05 MED ORDER — CEFAZOLIN SODIUM-DEXTROSE 2-4 GM/100ML-% IV SOLN
2.0000 g | Freq: Once | INTRAVENOUS | Status: AC
  Administered 2015-12-05: 2 g via INTRAVENOUS

## 2015-12-05 MED ORDER — HEPARIN SODIUM (PORCINE) 5000 UNIT/ML IJ SOLN
INTRAMUSCULAR | Status: AC
Start: 2015-12-05 — End: 2015-12-05
  Filled 2015-12-05: qty 1

## 2015-12-05 MED ORDER — HEMOSTATIC AGENTS (NO CHARGE) OPTIME
TOPICAL | Status: DC | PRN
  Administered 2015-12-05: 1 via TOPICAL

## 2015-12-05 MED ORDER — MORPHINE SULFATE (PF) 2 MG/ML IV SOLN
1.0000 mg | Freq: Once | INTRAVENOUS | Status: AC
  Administered 2015-12-05: 1 mg via INTRAVENOUS

## 2015-12-05 MED ORDER — CEFAZOLIN SODIUM-DEXTROSE 2-4 GM/100ML-% IV SOLN
INTRAVENOUS | Status: AC
  Filled 2015-12-05: qty 100

## 2015-12-05 MED ORDER — PHENYLEPHRINE HCL 10 MG/ML IJ SOLN
INTRAMUSCULAR | Status: DC | PRN
  Administered 2015-12-05: 100 ug via INTRAVENOUS

## 2015-12-05 MED ORDER — FAMOTIDINE 20 MG PO TABS
ORAL_TABLET | ORAL | Status: AC
  Filled 2015-12-05: qty 1

## 2015-12-05 MED ORDER — SODIUM CHLORIDE 0.9 % IJ SOLN
INTRAMUSCULAR | Status: AC
  Filled 2015-12-05: qty 50

## 2015-12-05 MED ORDER — LIDOCAINE HCL (CARDIAC) 20 MG/ML IV SOLN
INTRAVENOUS | Status: DC | PRN
  Administered 2015-12-05: 80 mg via INTRAVENOUS

## 2015-12-05 MED ORDER — SODIUM CHLORIDE 0.9 % IV SOLN
INTRAVENOUS | Status: DC | PRN
  Administered 2015-12-05: 09:00:00

## 2015-12-05 MED ORDER — NITROGLYCERIN 0.4 MG SL SUBL
SUBLINGUAL_TABLET | SUBLINGUAL | Status: AC
  Administered 2015-12-05: 0.4 mg via SUBLINGUAL
  Filled 2015-12-05: qty 1

## 2015-12-05 MED ORDER — GLYCOPYRROLATE 0.2 MG/ML IJ SOLN
INTRAMUSCULAR | Status: DC | PRN
  Administered 2015-12-05: .8 mg via INTRAVENOUS
  Administered 2015-12-05: 0.2 mg via INTRAVENOUS

## 2015-12-05 MED ORDER — NEOSTIGMINE METHYLSULFATE 10 MG/10ML IV SOLN
INTRAVENOUS | Status: DC | PRN
  Administered 2015-12-05: 5 mg via INTRAVENOUS

## 2015-12-05 MED ORDER — NITROGLYCERIN 0.4 MG SL SUBL
0.4000 mg | SUBLINGUAL_TABLET | Freq: Once | SUBLINGUAL | Status: AC
  Administered 2015-12-05: 0.4 mg via SUBLINGUAL

## 2015-12-05 MED ORDER — BUPIVACAINE-EPINEPHRINE (PF) 0.5% -1:200000 IJ SOLN
INTRAMUSCULAR | Status: DC | PRN
  Administered 2015-12-05: 7 mL

## 2015-12-05 MED ORDER — ROCURONIUM BROMIDE 100 MG/10ML IV SOLN
INTRAVENOUS | Status: DC | PRN
  Administered 2015-12-05: 35 mg via INTRAVENOUS
  Administered 2015-12-05 (×2): 5 mg via INTRAVENOUS

## 2015-12-05 MED ORDER — HEPARIN SODIUM (PORCINE) 5000 UNIT/ML IJ SOLN
INTRAMUSCULAR | Status: DC | PRN
  Administered 2015-12-05: 5000 [IU]

## 2015-12-05 MED ORDER — BUPIVACAINE-EPINEPHRINE (PF) 0.25% -1:200000 IJ SOLN
INTRAMUSCULAR | Status: AC
  Filled 2015-12-05: qty 30

## 2015-12-05 MED ORDER — FAMOTIDINE 20 MG PO TABS
20.0000 mg | ORAL_TABLET | Freq: Once | ORAL | Status: AC
  Administered 2015-12-05: 20 mg via ORAL

## 2015-12-05 MED ORDER — HEPARIN SODIUM (PORCINE) 5000 UNIT/ML IJ SOLN
INTRAMUSCULAR | Status: AC
  Filled 2015-12-05: qty 1

## 2015-12-05 MED ORDER — MIDAZOLAM HCL 2 MG/2ML IJ SOLN
INTRAMUSCULAR | Status: DC | PRN
  Administered 2015-12-05: 2 mg via INTRAVENOUS

## 2015-12-05 MED ORDER — FENTANYL CITRATE (PF) 100 MCG/2ML IJ SOLN
INTRAMUSCULAR | Status: AC
  Administered 2015-12-05: 25 ug via INTRAVENOUS
  Filled 2015-12-05: qty 2

## 2015-12-05 MED ORDER — PROPOFOL 10 MG/ML IV BOLUS
INTRAVENOUS | Status: DC | PRN
  Administered 2015-12-05: 120 mg via INTRAVENOUS

## 2015-12-05 MED ORDER — SUCCINYLCHOLINE CHLORIDE 20 MG/ML IJ SOLN
INTRAMUSCULAR | Status: DC | PRN
  Administered 2015-12-05: 100 mg via INTRAVENOUS

## 2015-12-05 MED ORDER — ONDANSETRON HCL 4 MG/2ML IJ SOLN: 4.0000 mg | Freq: Once | INTRAMUSCULAR | Status: DC | PRN

## 2015-12-05 MED ORDER — HYDROCODONE-ACETAMINOPHEN 5-325 MG PO TABS
1.0000 | ORAL_TABLET | ORAL | Status: DC | PRN
  Administered 2015-12-05: 1 via ORAL

## 2015-12-05 MED ORDER — MORPHINE SULFATE (PF) 2 MG/ML IV SOLN
INTRAVENOUS | Status: AC
  Administered 2015-12-05: 1 mg via INTRAVENOUS
  Filled 2015-12-05: qty 1

## 2015-12-05 MED ORDER — FENTANYL CITRATE (PF) 100 MCG/2ML IJ SOLN
25.0000 ug | INTRAMUSCULAR | Status: AC | PRN
  Administered 2015-12-05 (×6): 25 ug via INTRAVENOUS

## 2015-12-05 MED ORDER — ONDANSETRON HCL 4 MG/2ML IJ SOLN
INTRAMUSCULAR | Status: DC | PRN
  Administered 2015-12-05: 4 mg via INTRAVENOUS

## 2015-12-05 MED ORDER — SODIUM CHLORIDE 0.9 % IV SOLN
INTRAVENOUS | Status: DC
  Administered 2015-12-05: 09:00:00 via INTRAVENOUS

## 2015-12-05 MED ORDER — FENTANYL CITRATE (PF) 100 MCG/2ML IJ SOLN
INTRAMUSCULAR | Status: DC | PRN
  Administered 2015-12-05: 25 ug via INTRAVENOUS
  Administered 2015-12-05: 50 ug via INTRAVENOUS
  Administered 2015-12-05: 25 ug via INTRAVENOUS
  Administered 2015-12-05: 100 ug via INTRAVENOUS
  Administered 2015-12-05: 25 ug via INTRAVENOUS
  Administered 2015-12-05: 50 ug via INTRAVENOUS

## 2015-12-05 MED ORDER — HYDROCODONE-ACETAMINOPHEN 5-325 MG PO TABS: 1.0000 | ORAL_TABLET | ORAL | Status: DC | PRN

## 2015-12-05 MED ORDER — HYDROCODONE-ACETAMINOPHEN 5-325 MG PO TABS
ORAL_TABLET | ORAL | Status: AC
  Filled 2015-12-05: qty 1

## 2015-12-05 SURGICAL SUPPLY — 39 items
APPLIER CLIP ROT 10 11.4 M/L (STAPLE) ×4
CANISTER SUCT 1200ML W/VALVE (MISCELLANEOUS) ×4 IMPLANT
CANNULA DILATOR 10 W/SLV (CANNULA) ×3 IMPLANT
CANNULA DILATOR 10MM W/SLV (CANNULA) ×1
CATH REDDICK CHOLANGI 4FR 50CM (CATHETERS) ×4 IMPLANT
CHLORAPREP W/TINT 26ML (MISCELLANEOUS) ×4 IMPLANT
CLIP APPLIE ROT 10 11.4 M/L (STAPLE) ×2 IMPLANT
CLOSURE WOUND 1/2 X4 (GAUZE/BANDAGES/DRESSINGS) ×1
DRAPE SHEET LG 3/4 BI-LAMINATE (DRAPES) ×4 IMPLANT
ELECT REM PT RETURN 9FT ADLT (ELECTROSURGICAL) ×4
ELECTRODE REM PT RTRN 9FT ADLT (ELECTROSURGICAL) ×2 IMPLANT
GAUZE SPONGE 4X4 12PLY STRL (GAUZE/BANDAGES/DRESSINGS) ×4 IMPLANT
GLOVE BIO SURGEON STRL SZ7.5 (GLOVE) ×4 IMPLANT
GOWN STRL REUS W/ TWL LRG LVL3 (GOWN DISPOSABLE) ×8 IMPLANT
GOWN STRL REUS W/TWL LRG LVL3 (GOWN DISPOSABLE) ×8
HEMOSTAT APPLICATOR AVITENE (MISCELLANEOUS) ×4 IMPLANT
IRRIGATION STRYKERFLOW (MISCELLANEOUS) ×2 IMPLANT
IRRIGATOR STRYKERFLOW (MISCELLANEOUS) ×4
IV NS 1000ML (IV SOLUTION) ×2
IV NS 1000ML BAXH (IV SOLUTION) ×2 IMPLANT
KIT RM TURNOVER STRD PROC AR (KITS) ×4 IMPLANT
LABEL OR SOLS (LABEL) ×4 IMPLANT
NDL INSUFF ACCESS 14 VERSASTEP (NEEDLE) ×4 IMPLANT
NEEDLE FILTER BLUNT 18X 1/2SAF (NEEDLE) ×2
NEEDLE FILTER BLUNT 18X1 1/2 (NEEDLE) ×2 IMPLANT
NEEDLE HYPO 25GX1X1/2 BEV (NEEDLE) ×4 IMPLANT
NS IRRIG 500ML POUR BTL (IV SOLUTION) ×4 IMPLANT
PACK LAP CHOLECYSTECTOMY (MISCELLANEOUS) ×4 IMPLANT
SCISSORS METZENBAUM CVD 33 (INSTRUMENTS) ×4 IMPLANT
SEAL FOR SCOPE WARMER C3101 (MISCELLANEOUS) ×4 IMPLANT
SLEEVE ENDOPATH XCEL 5M (ENDOMECHANICALS) ×4 IMPLANT
STRIP CLOSURE SKIN 1/2X4 (GAUZE/BANDAGES/DRESSINGS) ×3 IMPLANT
SUT CHROMIC 5 0 RB 1 27 (SUTURE) ×12 IMPLANT
SUT VIC AB 0 CT2 27 (SUTURE) IMPLANT
SYR 3ML LL SCALE MARK (SYRINGE) ×4 IMPLANT
TROCAR XCEL NON-BLD 11X100MML (ENDOMECHANICALS) ×4 IMPLANT
TROCAR XCEL NON-BLD 5MMX100MML (ENDOMECHANICALS) ×4 IMPLANT
TUBING INSUFFLATOR HI FLOW (MISCELLANEOUS) ×4 IMPLANT
WATER STERILE IRR 1000ML POUR (IV SOLUTION) ×4 IMPLANT

## 2015-12-05 NOTE — Transfer of Care (Signed)
Immediate Anesthesia Transfer of Care Note  Patient: Dorothy Gross  Procedure(s) Performed: Procedure(s): LAPAROSCOPIC CHOLECYSTECTOMY WITH INTRAOPERATIVE CHOLANGIOGRAM (N/A)  Patient Location: PACU  Anesthesia Type:General  Level of Consciousness: awake, alert  and responds to stimulation  Airway & Oxygen Therapy: Patient Spontanous Breathing and Patient connected to face mask oxygen  Post-op Assessment: Report given to RN and Post -op Vital signs reviewed and stable  Post vital signs: Reviewed and stable  Last Vitals:  Filed Vitals:   12/05/15 1211 12/05/15 1212  BP: 124/56 124/56  Pulse: 66 66  Temp: 36.2 C   Resp: 12 13    Complications: No apparent anesthesia complications

## 2015-12-05 NOTE — Anesthesia Preprocedure Evaluation (Addendum)
Anesthesia Evaluation  Patient identified by MRN, date of birth, ID band Patient awake    Reviewed: Allergy & Precautions, NPO status , Patient's Chart, lab work & pertinent test results  Airway Mallampati: III  TM Distance: <3 FB Neck ROM: Full    Dental  (+) Chipped, Caps   Pulmonary former smoker,    Pulmonary exam normal breath sounds clear to auscultation       Cardiovascular hypertension, Pt. on medications and Pt. on home beta blockers + CAD and +CHF  Normal cardiovascular exam  Hx of A flutter   Neuro/Psych negative neurological ROS  negative psych ROS   GI/Hepatic Neg liver ROS, Acute gallbladder   Endo/Other  diabetes, Well Controlled, Type 2, Oral Hypoglycemic Agents  Renal/GU negative Renal ROS  negative genitourinary   Musculoskeletal negative musculoskeletal ROS (+)   Abdominal Normal abdominal exam  (+)   Peds negative pediatric ROS (+)  Hematology negative hematology ROS (+)   Anesthesia Other Findings Hx of atrial flutter  Reproductive/Obstetrics                         Anesthesia Physical Anesthesia Plan  ASA: III  Anesthesia Plan: General   Post-op Pain Management:    Induction: Intravenous  Airway Management Planned: Oral ETT  Additional Equipment:   Intra-op Plan:   Post-operative Plan: Extubation in OR  Informed Consent: I have reviewed the patients History and Physical, chart, labs and discussed the procedure including the risks, benefits and alternatives for the proposed anesthesia with the patient or authorized representative who has indicated his/her understanding and acceptance.   Dental advisory given  Plan Discussed with: CRNA and Surgeon  Anesthesia Plan Comments:         Anesthesia Quick Evaluation

## 2015-12-05 NOTE — H&P (Signed)
  She reports no change in condition since office exam. Has been off Eliquis and ASA for one week as advised by cardiology..  Discussed plan for lap cholecystectomy.

## 2015-12-05 NOTE — Anesthesia Procedure Notes (Signed)
Procedure Name: Intubation Performed by: Lance Muss Pre-anesthesia Checklist: Emergency Drugs available, Patient identified, Suction available, Patient being monitored and Timeout performed Patient Re-evaluated:Patient Re-evaluated prior to inductionOxygen Delivery Method: Circle system utilized Preoxygenation: Pre-oxygenation with 100% oxygen Intubation Type: IV induction Ventilation: Mask ventilation without difficulty Laryngoscope Size: Mac and 3 Grade View: Grade III Tube type: Oral Tube size: 7.0 mm Number of attempts: 2 Airway Equipment and Method: Bougie stylet Placement Confirmation: ETT inserted through vocal cords under direct vision,  positive ETCO2 and breath sounds checked- equal and bilateral Secured at: 22 cm Tube secured with: Tape Dental Injury: Teeth and Oropharynx as per pre-operative assessment  Difficulty Due To: Difficulty was anticipated and Difficult Airway- due to anterior larynx Comments: Possible recommendations is to use glidescope for anterior airway.

## 2015-12-05 NOTE — Op Note (Signed)
OPERATIVE REPORT  PREOPERATIVE DIAGNOSIS:  Chronic cholecystitis cholelithiasis  POSTOPERATIVE DIAGNOSIS: Chronic cholecystitis cholelithiasis  PROCEDURE: Laparoscopic cholecystectomy with cholangiogram  ANESTHESIA: General  SURGEON: Rochel Brome M.D.  INDICATIONS: She is a history of an episode of right upper quadrant abdominal pain. She also had an ultrasound demonstrating a gallstone. Surgery was recommended for definitive treatment.   With the patient on the operating table in the supine position under general endotracheal anesthesia the abdomen was prepared with ChloraPrep solution and draped in a sterile manner. A short incision was made in the inferior aspect of the umbilicus and carried down to the deep fascia which was grasped with a laryngeal hook. A Veress needle was inserted aspirated and irrigated with a saline solution. The peritoneal cavity was insufflated with carbon dioxide. The Veress needle was removed. The 10 mm cannula was inserted. The 10 mm 0 laparoscope was inserted to view the peritoneal cavity.  Another incision was made in the epigastrium slightly to the right of the midline to introduce an 11 mm cannula. 2 incisions were made in the lateral aspect of the right upper quadrant to introduce 2   5 mm cannulas.  Initial inspection revealed a smooth surface of the liver. There were some adhesions surrounding the gallbladder. There were also some adhesions between the omentum and the anterior abdominal wall in the right upper quadrant and also in the periumbilical area. One adhesion in the right upper quadrant was dissected free from the anterior abdominal wall with scissors and cautery.The gallbladder was retracted towards the right shoulder. Multiple adhesions were taken down with blunt dissection and also use of hook and cautery. The gallbladder neck was retracted inferiorly and laterally.  The porta hepatis was identified. The gallbladder was mobilized with incision of the  visceral peritoneum. The cystic duct was dissected free from surrounding structures. The cystic artery was dissected free from surrounding structures. A critical view of safety was demonstrated  An Endo Clip was placed across the cystic duct adjacent to the gallbladder neck. An incision was made in the cystic duct to introduce a Reddick catheter. There were multiple small stones seen coming out of the cystic duct. The cholangiogram was done with injection of half-strength Conray 60 dye. This demonstrated the bile ducts and flow of dye into the duodenum. No retained stones were identified. The cholangiogram appeared normal. The Reddick catheter was removed. The cystic duct was doubly ligated with endoclips and divided. The cystic artery was controlled with double endoclips and divided. The gallbladder was dissected free from the liver with use of hook and cautery and blunt dissection. This dissection was carried out in all was mostly an avascular plane however there was one bleeding point in the liver bed which was cauterized and continued to bleed and was controlled with holding pressure and then insufflated cauterizing again until the bleeding stopped was also treated with EndoAvitene and pressure held and hemostasis was surgically intact. It is noted that during the course of the procedure the site was irrigated and aspirated with heparinized saline solution. The gallbladder was delivered up through the infraumbilical incision opened and suctioned.  The gallbladder was removed and submitted in formalin for routine pathology. The umbilical port was reinserted and further examined the operative area irrigated and aspirated and could see the EndoAvitene and see that hemostasis was intact. The cannulas were removed allowing carbon dioxide to escape from the peritoneal cavity. There was some bleeding from the epigastric port site which resolved with pressure and also tiny  bleeding points cauterized and also injected  with half percent Sensorcaine with epinephrine The skin incisions were closed with interrupted 5-0 chromic subcutaneous suture benzoin and Steri-Strips. Gauze dressings were applied with paper tape.  The patient appeared to be in satisfactory condition and was prepared for transfer to the recovery room  [place].D.

## 2015-12-05 NOTE — Discharge Instructions (Addendum)
Take Tylenol or Norco if needed for pain.  Resume Eliquis and aspirin on Sunday.  Remove dressings on Saturday. May shower Sunday.  AMBULATORY SURGERY  DISCHARGE INSTRUCTIONS   1) The drugs that you were given will stay in your system until tomorrow so for the next 24 hours you should not:  A) Drive an automobile B) Make any legal decisions C) Drink any alcoholic beverage   2) You may resume regular meals tomorrow.  Today it is better to start with liquids and gradually work up to solid foods.  You may eat anything you prefer, but it is better to start with liquids, then soup and crackers, and gradually work up to solid foods.   3) Please notify your doctor immediately if you have any unusual bleeding, trouble breathing, redness and pain at the surgery site, drainage, fever, or pain not relieved by medication.    4) Additional Instructions:        Please contact your physician with any problems or Same Day Surgery at [phone], Monday through Friday 6 am to 4 pm, or [place] at [facility] number at [phone].

## 2015-12-06 NOTE — Anesthesia Postprocedure Evaluation (Addendum)
Anesthesia Post Note  Patient: Sora A Hammer  Procedure(s) Performed: Procedure(s) (LRB): LAPAROSCOPIC CHOLECYSTECTOMY WITH INTRAOPERATIVE CHOLANGIOGRAM (N/A)  Patient location during evaluation: PACU Anesthesia Type: General Level of consciousness: awake and alert and oriented Pain management: pain level controlled Vital Signs Assessment: post-procedure vital signs reviewed and stable Respiratory status: spontaneous breathing Cardiovascular status: blood pressure returned to baseline Anesthetic complications: no Comments: Patient complained of some chest pressure in the PACU...the patient was treated with Morphine, O2, and NTG SL.  The 12 lead EKG did not show any changes from 3 years ago.  Dr. Clayborn Bigness was called and came to see the patient and cleared patient stating that he felt this was not coronary ischemia related.  Symptoms resolved    Last Vitals:  Filed Vitals:   12/05/15 1426 12/05/15 1522  BP: 127/57 135/55  Pulse: 62 64  Temp: 36 C 36 C  Resp: 16 16    Last Pain:  Filed Vitals:   12/05/15 1523  PainSc: 4                  Shirell Struthers

## 2015-12-08 ENCOUNTER — Encounter: Payer: Self-pay | Admitting: Surgery

## 2015-12-08 LAB — SURGICAL PATHOLOGY

## 2016-05-13 ENCOUNTER — Other Ambulatory Visit: Payer: Self-pay | Admitting: Family Medicine

## 2016-05-13 DIAGNOSIS — Z1231 Encounter for screening mammogram for malignant neoplasm of breast: Secondary | ICD-10-CM

## 2016-06-22 ENCOUNTER — Encounter: Payer: Self-pay | Admitting: Emergency Medicine

## 2016-06-22 ENCOUNTER — Emergency Department: Payer: BLUE CROSS/BLUE SHIELD

## 2016-06-22 ENCOUNTER — Emergency Department
Admission: EM | Admit: 2016-06-22 | Discharge: 2016-06-22 | Disposition: A | Discharge status: Home / Self Care | Payer: BLUE CROSS/BLUE SHIELD | Attending: Emergency Medicine | Admitting: Emergency Medicine

## 2016-06-22 DIAGNOSIS — R509 Fever, unspecified: Secondary | ICD-10-CM | POA: Diagnosis not present

## 2016-06-22 DIAGNOSIS — Z7982 Long term (current) use of aspirin: Secondary | ICD-10-CM | POA: Insufficient documentation

## 2016-06-22 DIAGNOSIS — I110 Hypertensive heart disease with heart failure: Secondary | ICD-10-CM | POA: Insufficient documentation

## 2016-06-22 DIAGNOSIS — I509 Heart failure, unspecified: Secondary | ICD-10-CM | POA: Insufficient documentation

## 2016-06-22 DIAGNOSIS — Z87891 Personal history of nicotine dependence: Secondary | ICD-10-CM | POA: Insufficient documentation

## 2016-06-22 DIAGNOSIS — I2510 Atherosclerotic heart disease of native coronary artery without angina pectoris: Secondary | ICD-10-CM | POA: Insufficient documentation

## 2016-06-22 DIAGNOSIS — Z7984 Long term (current) use of oral hypoglycemic drugs: Secondary | ICD-10-CM | POA: Insufficient documentation

## 2016-06-22 DIAGNOSIS — Z791 Long term (current) use of non-steroidal anti-inflammatories (NSAID): Secondary | ICD-10-CM | POA: Diagnosis not present

## 2016-06-22 DIAGNOSIS — E119 Type 2 diabetes mellitus without complications: Secondary | ICD-10-CM | POA: Insufficient documentation

## 2016-06-22 DIAGNOSIS — Z79899 Other long term (current) drug therapy: Secondary | ICD-10-CM | POA: Insufficient documentation

## 2016-06-22 LAB — BASIC METABOLIC PANEL
Anion gap: 10 (ref 5–15)
BUN: 15 mg/dL (ref 6–20)
CALCIUM: 8.6 mg/dL — AB (ref 8.9–10.3)
CHLORIDE: 102 mmol/L (ref 101–111)
CO2: 24 mmol/L (ref 22–32)
CREATININE: 0.84 mg/dL (ref 0.44–1.00)
GFR calc Af Amer: >60 mL/min (ref >60)
GFR calc non Af Amer: >60 mL/min (ref >60)
GLUCOSE: 111 mg/dL — AB (ref 65–99)
Potassium: 3.4 mmol/L — ABNORMAL LOW (ref 3.5–5.1)
Sodium: 136 mmol/L (ref 135–145)

## 2016-06-22 LAB — LACTIC ACID, PLASMA: LACTIC ACID, VENOUS: 0.8 mmol/L (ref 0.5–1.9)

## 2016-06-22 LAB — CBC
HCT: 36.4 % (ref 35.0–47.0)
Hemoglobin: 12 g/dL (ref 12.0–16.0)
MCH: 26.6 pg (ref 26.0–34.0)
MCHC: 32.9 g/dL (ref 32.0–36.0)
MCV: 81 fL (ref 80.0–100.0)
PLATELETS: 247 10*3/uL (ref 150–440)
RBC: 4.5 MIL/uL (ref 3.80–5.20)
RDW: 16.4 % — ABNORMAL HIGH (ref 11.5–14.5)
WBC: 17.3 10*3/uL — ABNORMAL HIGH (ref 3.6–11.0)

## 2016-06-22 IMAGING — CR DG CHEST 2V
2 series · 2 of 2 positions shown · non-contrast
Comparison: PA and lateral chest [DATE].

CLINICAL DATA: Fever for 3 days.

EXAM:
CHEST  2 VIEW

[chest pa]
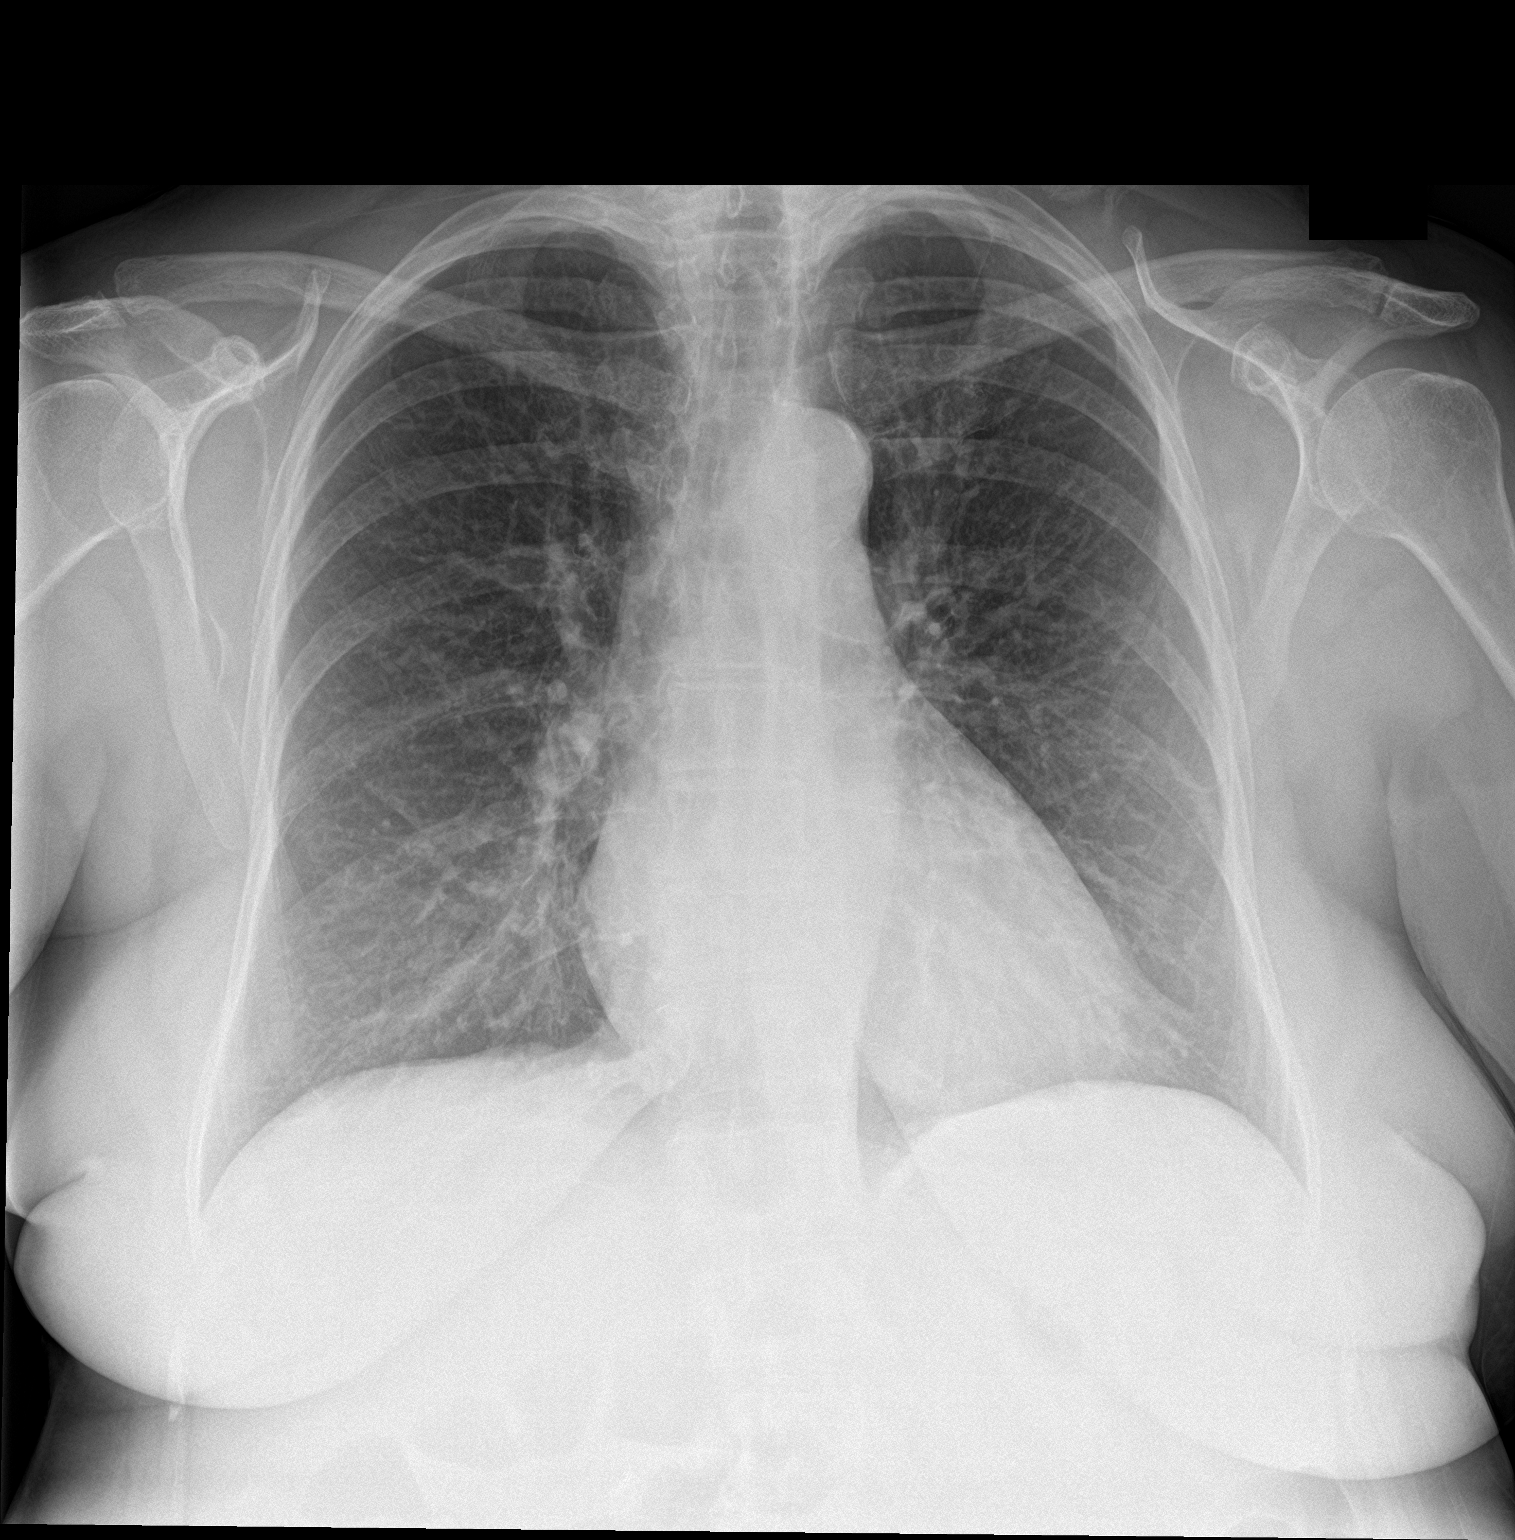

[chest lat]
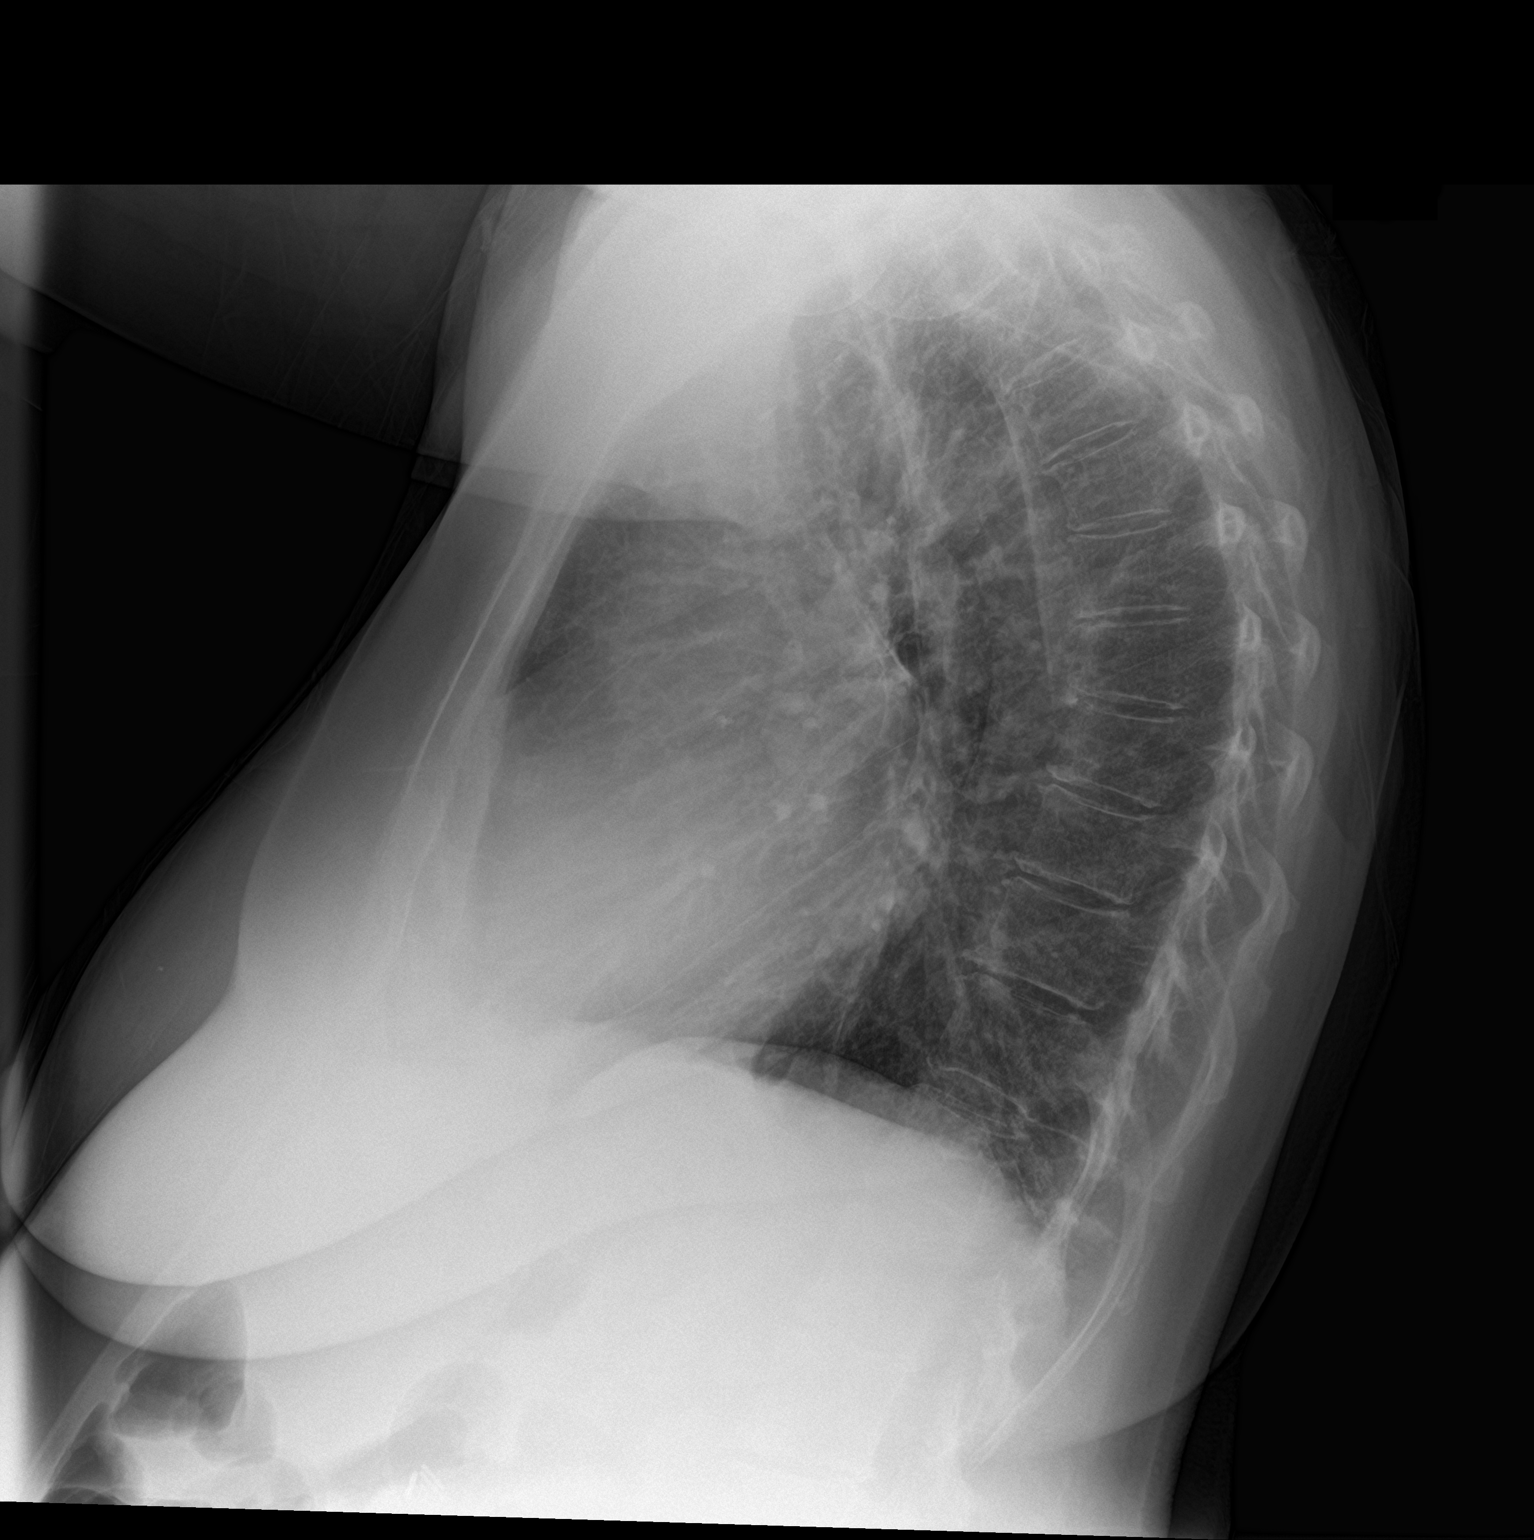

[2 of 2 positions shown; findings below may reference images not displayed]

FINDINGS: Lungs are clear. Heart size is upper normal. No pneumothorax or
pleural effusion. Aortic atherosclerosis noted.
IMPRESSION: No acute disease.

## 2016-06-22 NOTE — ED Provider Notes (Signed)
[facility] Emergency Department Provider Note    ____________________________________________   I have reviewed the triage vital signs and the nursing notes.   HISTORY  Chief Complaint Fever   History limited by: Not Limited   HPI Dorothy Gross is a 65 y.o. female who presents to the emergency department today at the request of her physician so she could have another evaluation. The patient states that she saw her physician yesterday because of fever. At that time a CXR was done which showed a pneumonia. She was given a shot of antibiotics and then a prescription. Today she saw her doctor again who checked blood work. Given that her serum WBCs were greater today the patient was instructed to come to the emergency department. The patient states that for the past month or so she has been having problems with recurrent fevers for which she has received antibiotics. She has not had any nausea or vomiting.     Past Medical History:  Diagnosis Date  . CAD (coronary artery disease)   . CHF (congestive heart failure) ([place])   . Diabetes mellitus without complication ([place])   . Hyperlipidemia   . Hypertension     There are no active problems to display for this patient.   Past Surgical History:  Procedure Laterality Date  . ABDOMINAL HYSTERECTOMY    . BREAST BIOPSY Right    negative 1974 negative 2015  . CHOLECYSTECTOMY N/A 12/05/2015   Procedure: LAPAROSCOPIC CHOLECYSTECTOMY WITH INTRAOPERATIVE CHOLANGIOGRAM;  Surgeon: Leonie Green, MD;  Location: [facility] ORS;  Service: General;  Laterality: N/A;  . JOINT REPLACEMENT     both knees  . TONSILLECTOMY      Prior to Admission medications   Medication Sig Start Date End Date Taking? Authorizing Provider  amiodarone (PACERONE) 200 MG tablet TAKE 1 TABLET BY MOUTH EVERY DAY 08/20/15   Historical Provider, MD  apixaban (ELIQUIS) 5 MG TABS tablet TAKE 1 TABLET (5 MG TOTAL) BY MOUTH 2 (TWO) TIMES DAILY.  05/21/15   Historical Provider, MD  aspirin EC 81 MG tablet TAKE 1 TABLET BY MOUTH EVERY DAY 12/30/14   Historical Provider, MD  Blood Glucose Monitoring Suppl (FIFTY50 GLUCOSE METER 2.0) w/Device KIT Use 2 (two) times daily. One Touch Ultra-DX CODE E11.9 08/14/15 08/13/16  Historical Provider, MD  Calcium Carbonate-Vit D-Min (CALCIUM 1200 PO) Take 1,200 mg by mouth daily.    Historical Provider, MD  carvedilol (COREG) 6.25 MG tablet TAKE 1 TABLET BY MOUTH TWICE A DAY 04/09/15   Historical Provider, MD  Cholecalciferol (VITAMIN D3) 1000 units CAPS Take 1,000 Units by mouth daily.     Historical Provider, MD  digoxin (LANOXIN) 0.125 MG tablet TAKE 1 TABLET BY MOUTH ONCE A DAY 06/19/15   Historical Provider, MD  docusate sodium (DULCOLAX) 100 MG capsule Take 100 mg by mouth daily.    Historical Provider, MD  EQL NATURAL ZINC 50 MG TABS Take 50 mg by mouth daily.     Historical Provider, MD  FIBER SELECT GUMMIES PO Take 2 tablets by mouth daily.    Historical Provider, MD  Flaxseed, Linseed, (FLAX SEED OIL PO) Take 2 capsules by mouth daily.    Historical Provider, MD  furosemide (LASIX) 40 MG tablet TAKE 1 TABLET BY MOUTH EVERY DAY 07/17/15   Historical Provider, MD  glipiZIDE (GLUCOTROL XL) 2.5 MG 24 hr tablet Take 2.5 mg by mouth daily with breakfast.  08/14/15 08/13/16  Historical Provider, MD  glucose blood test strip  08/14/15 08/13/16  Historical Provider, MD  HYDROcodone-acetaminophen (NORCO) 5-325 MG tablet Take 1-2 tablets by mouth every 4 (four) hours as needed for moderate pain. 12/05/15   Leonie Green, MD  Lancets MISC  08/14/15   Historical Provider, MD  losartan (COZAAR) 50 MG tablet Take 50 mg by mouth daily.  07/21/15   Historical Provider, MD  Multiple Vitamins-Minerals (MULTIVITAMIN ADULT PO) Take 1 tablet by mouth daily.    Historical Provider, MD  simvastatin (ZOCOR) 20 MG tablet TAKE 1 TABLET BY MOUTH ONCE DAILY. 08/26/15   Historical Provider, MD  traMADol (ULTRAM) 50 MG  tablet Take 50 mg by mouth every 6 (six) hours as needed.  08/14/15   Historical Provider, MD    Allergies Review of patient's allergies indicates no known allergies.  Family History  Problem Relation Age of Onset  . Diabetes Mother   . Diabetes Sister   . Diabetes Maternal Aunt     Social History Social History  Substance Use Topics  . Smoking status: Former Smoker    Packs/day: 0.25    Years: 15.00    Types: Cigarettes    Quit date: 08/31/2007  . Smokeless tobacco: Never Used  . Alcohol use No    Review of Systems  Constitutional: Positive for fever. Cardiovascular: Negative for chest pain. Respiratory: Negative for shortness of breath. Gastrointestinal: Negative for abdominal pain, vomiting and diarrhea. Genitourinary: Negative for dysuria. Musculoskeletal: Negative for back pain. Skin: Negative for rash. Neurological: Negative for headaches, focal weakness or numbness.  10-point ROS otherwise negative.  ____________________________________________   PHYSICAL EXAM:  VITAL SIGNS: ED Triage Vitals  Enc Vitals Group     BP 06/22/16 1814 (!) 149/56     Pulse Rate 06/22/16 1814 84     Resp 06/22/16 1814 18     Temp 06/22/16 1814 99 F (37.2 C)     Temp Source 06/22/16 1814 Oral     SpO2 06/22/16 1814 97 %     Weight 06/22/16 1814 200 lb (90.7 kg)     Height 06/22/16 1814 5' 4"  (1.626 m)     Head Circumference --      Peak Flow --      Pain Score 06/22/16 1834 2   Constitutional: Alert and oriented. Well appearing and in no distress. Eyes: Conjunctivae are normal. Normal extraocular movements. ENT   Head: Normocephalic and atraumatic.   Nose: No congestion/rhinnorhea.   Mouth/Throat: Mucous membranes are moist.   Neck: No stridor. Hematological/Lymphatic/Immunilogical: No cervical lymphadenopathy. Cardiovascular: Normal rate, regular rhythm.  No murmurs, rubs, or gallops. Respiratory: Normal respiratory effort without tachypnea nor  retractions. Breath sounds are clear and equal bilaterally. No wheezes/rales/rhonchi. Gastrointestinal: Soft and nontender. No distention.  Genitourinary: Deferred Musculoskeletal: Normal range of motion in all extremities. No lower extremity edema. Neurologic:  Normal speech and language. No gross focal neurologic deficits are appreciated.  Skin:  Skin is warm, dry and intact. No rash noted. Psychiatric: Mood and affect are normal. Speech and behavior are normal. Patient exhibits appropriate insight and judgment.  ____________________________________________    LABS (pertinent positives/negatives)  Labs Reviewed  BASIC METABOLIC PANEL - Abnormal; Notable for the following:       Result Value   Potassium 3.4 (*)    Glucose, Bld 111 (*)    Calcium 8.6 (*)    All other components within normal limits  CBC - Abnormal; Notable for the following:    WBC 17.3 (*)    RDW 16.4 (*)  All other components within normal limits  LACTIC ACID, PLASMA  LACTIC ACID, PLASMA     ____________________________________________   EKG  None  ____________________________________________    RADIOLOGY  CXR IMPRESSION:  No acute disease.    ____________________________________________   PROCEDURES  Procedures  ____________________________________________   INITIAL IMPRESSION / ASSESSMENT AND PLAN / ED COURSE  Pertinent labs & imaging results that were available during my care of the patient were reviewed by me and considered in my medical decision making (see chart for details).  Patient cxr without signs of pneumonia. At this point think patient's antibiotics are helping cure it given she had pneumonia on CXR two days ago. Think she can continue her regimen. Additionally lactic acid within normal range which is reassuring. Will have patient follow up with PCP. ____________________________________________   FINAL CLINICAL IMPRESSION(S) / ED DIAGNOSES  Final diagnoses:  Fever,  unspecified fever cause     Note: This dictation was prepared with Dragon dictation. Any transcriptional errors that result from this process are unintentional    Nance Pear, MD 06/22/16 2056

## 2016-06-22 NOTE — Discharge Instructions (Signed)
Please seek medical attention for any high fevers, chest pain, shortness of breath, change in behavior, persistent vomiting, bloody stool or any other new or concerning symptoms.  

## 2016-06-22 NOTE — ED Notes (Signed)
Pt. Verbalizes understanding of d/c instructions and follow-up. VS stable and pain controlled per pt.  Pt. In NAD at time of d/c and denies further concerns regarding this visit. Pt. Stable at the time of departure from the unit, departing unit by the safest and most appropriate manner per that pt condition and limitations. Pt advised to return to the ED at any time for emergent concerns, or for new/worsening symptoms.   

## 2016-06-22 NOTE — ED Triage Notes (Signed)
Pt to ED c/o fever x3 days.  States saw PCP and given antibiotic shot and prescription of amoxicillin.  Pt reports fever of 104 at home.  Pt seen by Dr. Kary Kos yesterday and dx community acquired pneumonia.  Pt reports taking tylenol at home around 1700 today.  Pt is A&Ox4, speaking in complete and coherent sentences, chest rise even and unlabored.

## 2016-06-30 ENCOUNTER — Ambulatory Visit
Admission: RE | Admit: 2016-06-30 | Discharge: 2016-06-30 | Disposition: A | Discharge status: Home / Self Care | Payer: BLUE CROSS/BLUE SHIELD | Source: Ambulatory Visit | Attending: Family Medicine | Admitting: Family Medicine

## 2016-06-30 DIAGNOSIS — Z1231 Encounter for screening mammogram for malignant neoplasm of breast: Secondary | ICD-10-CM | POA: Insufficient documentation

## 2016-06-30 IMAGING — MG DIGITAL SCREENING BILATERAL MAMMOGRAM WITH CAD
5 series · 5 of 5 positions shown · non-contrast
Comparison: Previous exam(s).

CLINICAL DATA: Screening.

EXAM:
DIGITAL SCREENING BILATERAL MAMMOGRAM WITH CAD

[R MLO (1 of 2)]
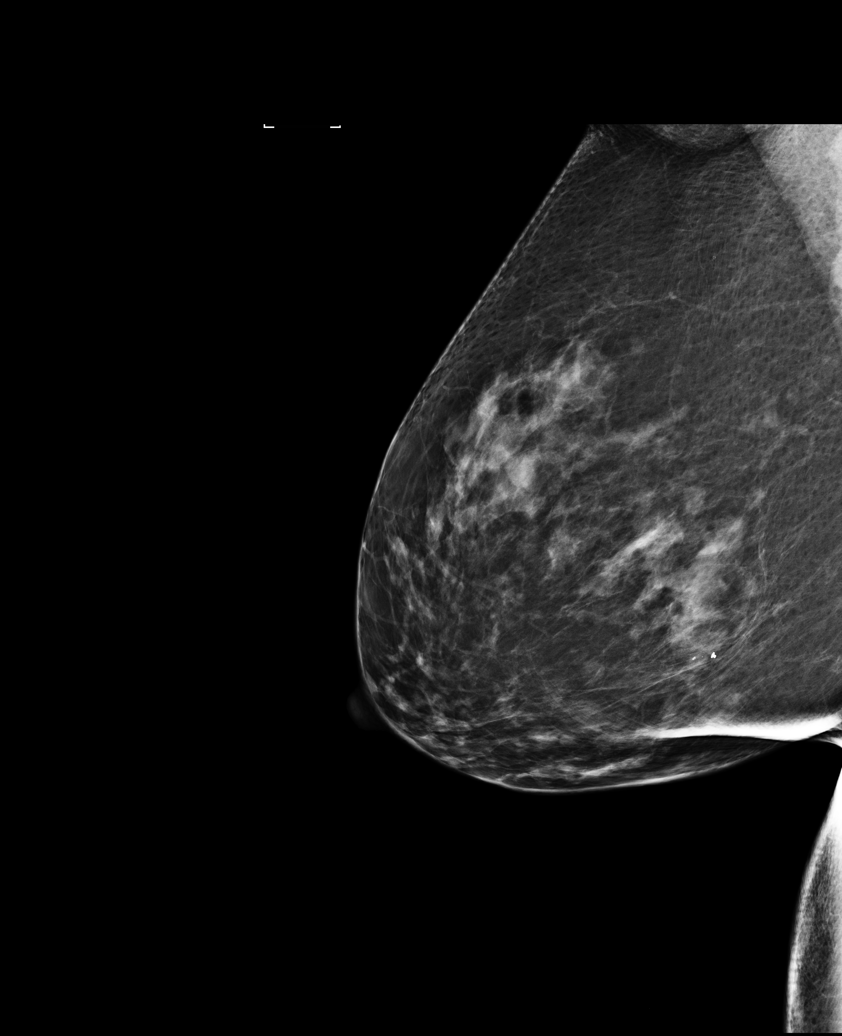

[L CC]
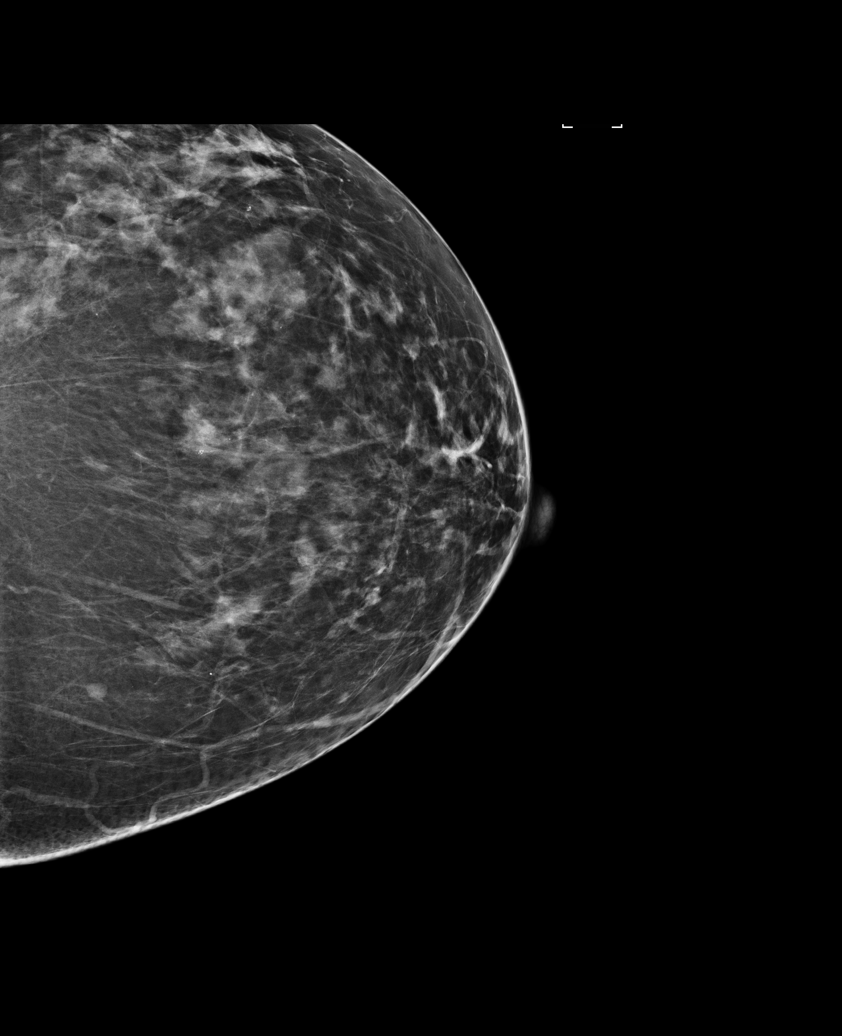

[R CC]
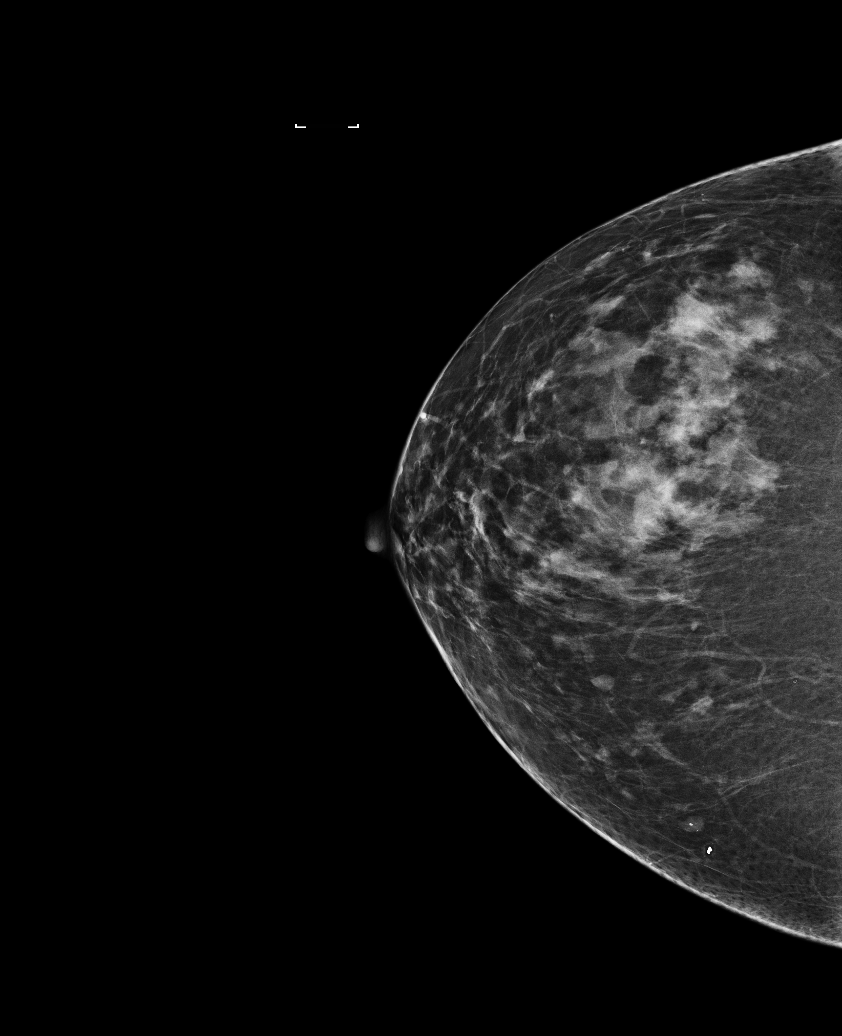

[R MLO (2 of 2)]
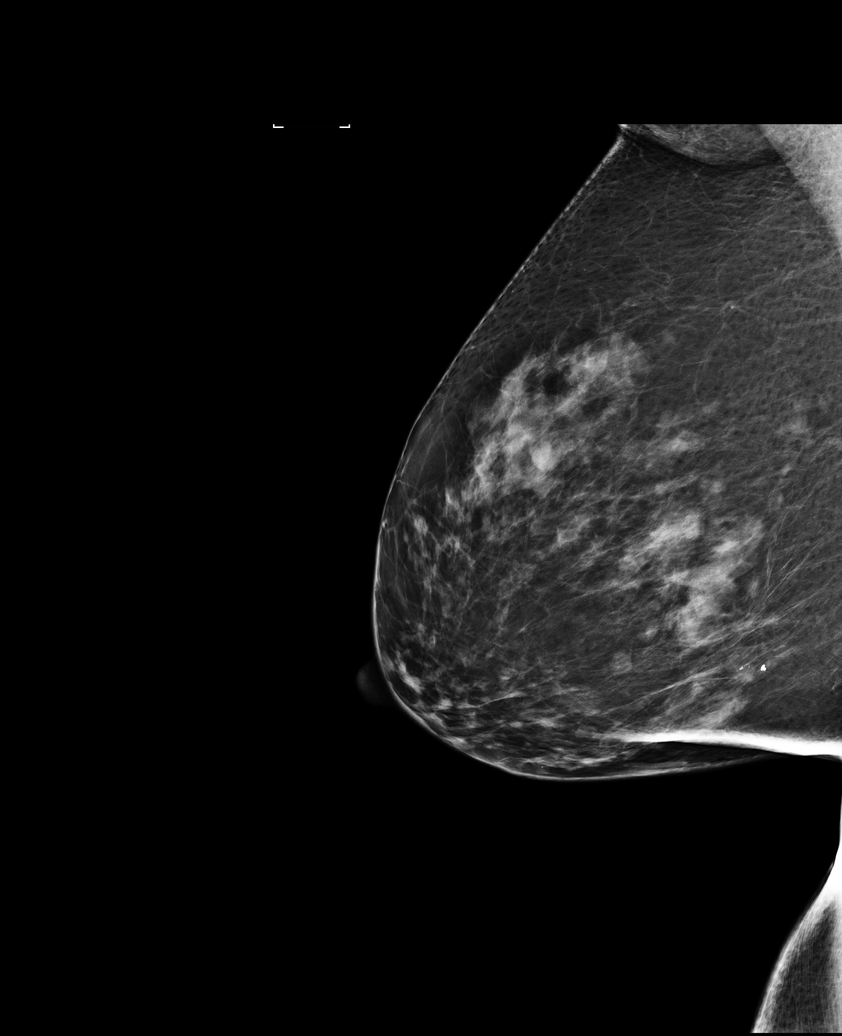

[L MLO]
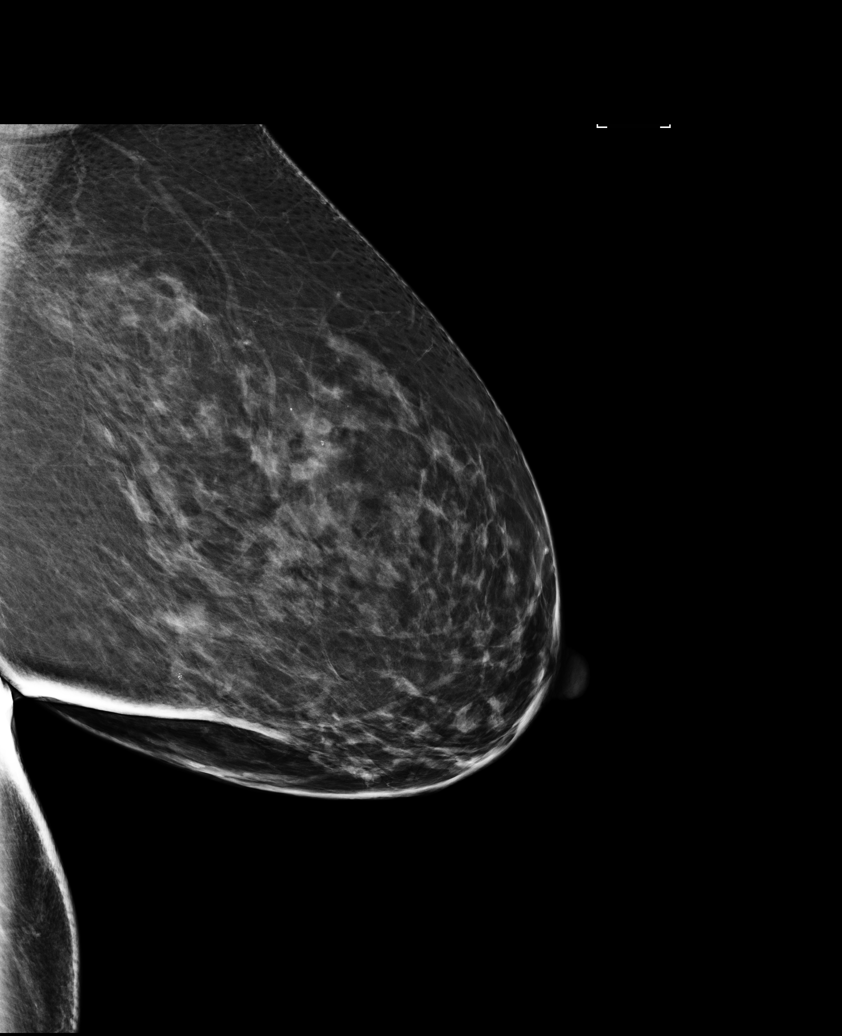

[5 of 5 positions shown; findings below may reference images not displayed]

ACR Breast Density Category c: The breast tissue is heterogeneously
dense, which may obscure small masses.
FINDINGS: There are no findings suspicious for malignancy. Images were
processed with CAD.
IMPRESSION: No mammographic evidence of malignancy. A result letter of this
screening mammogram will be mailed directly to the patient.

RECOMMENDATION:
Screening mammogram in one year. (Code:[0J])

BI-RADS CATEGORY  1: Negative.

## 2016-08-03 ENCOUNTER — Other Ambulatory Visit: Payer: Self-pay | Admitting: Family Medicine

## 2016-08-03 DIAGNOSIS — R0989 Other specified symptoms and signs involving the circulatory and respiratory systems: Secondary | ICD-10-CM

## 2016-08-10 ENCOUNTER — Ambulatory Visit
Admission: RE | Admit: 2016-08-10 | Discharge: 2016-08-10 | Disposition: A | Discharge status: Home / Self Care | Payer: BLUE CROSS/BLUE SHIELD | Source: Ambulatory Visit | Attending: Family Medicine | Admitting: Family Medicine

## 2016-08-10 DIAGNOSIS — I6523 Occlusion and stenosis of bilateral carotid arteries: Secondary | ICD-10-CM | POA: Insufficient documentation

## 2016-08-10 DIAGNOSIS — R0989 Other specified symptoms and signs involving the circulatory and respiratory systems: Secondary | ICD-10-CM | POA: Diagnosis not present

## 2016-10-15 ENCOUNTER — Encounter: Payer: Self-pay | Admitting: *Deleted

## 2016-10-18 ENCOUNTER — Encounter
Admission: RE | Disposition: A | Discharge status: Home / Self Care | Payer: Self-pay | Source: Ambulatory Visit | Attending: Unknown Physician Specialty

## 2016-10-18 ENCOUNTER — Encounter: Payer: Self-pay | Admitting: *Deleted

## 2016-10-18 ENCOUNTER — Ambulatory Visit: Payer: BLUE CROSS/BLUE SHIELD | Admitting: Anesthesiology

## 2016-10-18 ENCOUNTER — Ambulatory Visit
Admission: RE | Admit: 2016-10-18 | Discharge: 2016-10-18 | Disposition: A | Discharge status: Home / Self Care | Payer: BLUE CROSS/BLUE SHIELD | Source: Ambulatory Visit | Attending: Unknown Physician Specialty | Admitting: Unknown Physician Specialty

## 2016-10-18 DIAGNOSIS — I4892 Unspecified atrial flutter: Secondary | ICD-10-CM | POA: Diagnosis not present

## 2016-10-18 DIAGNOSIS — I2510 Atherosclerotic heart disease of native coronary artery without angina pectoris: Secondary | ICD-10-CM | POA: Diagnosis not present

## 2016-10-18 DIAGNOSIS — Z87891 Personal history of nicotine dependence: Secondary | ICD-10-CM | POA: Diagnosis not present

## 2016-10-18 DIAGNOSIS — I509 Heart failure, unspecified: Secondary | ICD-10-CM | POA: Insufficient documentation

## 2016-10-18 DIAGNOSIS — M1990 Unspecified osteoarthritis, unspecified site: Secondary | ICD-10-CM | POA: Diagnosis not present

## 2016-10-18 DIAGNOSIS — Z7901 Long term (current) use of anticoagulants: Secondary | ICD-10-CM | POA: Diagnosis not present

## 2016-10-18 DIAGNOSIS — Z7982 Long term (current) use of aspirin: Secondary | ICD-10-CM | POA: Insufficient documentation

## 2016-10-18 DIAGNOSIS — I429 Cardiomyopathy, unspecified: Secondary | ICD-10-CM | POA: Diagnosis not present

## 2016-10-18 DIAGNOSIS — I110 Hypertensive heart disease with heart failure: Secondary | ICD-10-CM | POA: Diagnosis not present

## 2016-10-18 DIAGNOSIS — E119 Type 2 diabetes mellitus without complications: Secondary | ICD-10-CM | POA: Diagnosis not present

## 2016-10-18 DIAGNOSIS — D122 Benign neoplasm of ascending colon: Secondary | ICD-10-CM | POA: Diagnosis not present

## 2016-10-18 DIAGNOSIS — Z7984 Long term (current) use of oral hypoglycemic drugs: Secondary | ICD-10-CM | POA: Diagnosis not present

## 2016-10-18 DIAGNOSIS — I4891 Unspecified atrial fibrillation: Secondary | ICD-10-CM | POA: Insufficient documentation

## 2016-10-18 DIAGNOSIS — Q438 Other specified congenital malformations of intestine: Secondary | ICD-10-CM | POA: Diagnosis not present

## 2016-10-18 DIAGNOSIS — E785 Hyperlipidemia, unspecified: Secondary | ICD-10-CM | POA: Insufficient documentation

## 2016-10-18 DIAGNOSIS — Z1211 Encounter for screening for malignant neoplasm of colon: Secondary | ICD-10-CM | POA: Insufficient documentation

## 2016-10-18 HISTORY — DX: Cardiomyopathy, unspecified: I42.9

## 2016-10-18 HISTORY — DX: Unspecified atrial fibrillation: I48.91

## 2016-10-18 HISTORY — PX: COLONOSCOPY WITH PROPOFOL: SHX5780

## 2016-10-18 HISTORY — DX: Unspecified osteoarthritis, unspecified site: M19.90

## 2016-10-18 LAB — GLUCOSE, CAPILLARY: Glucose-Capillary: 127 mg/dL — ABNORMAL HIGH (ref 65–99)

## 2016-10-18 SURGERY — COLONOSCOPY WITH PROPOFOL
Anesthesia: General

## 2016-10-18 MED ORDER — ONDANSETRON HCL 4 MG PO TABS
4.0000 mg | ORAL_TABLET | Freq: Once | ORAL | Status: AC
  Administered 2016-10-18: 4 mg via ORAL
  Filled 2016-10-18: qty 1

## 2016-10-18 MED ORDER — SODIUM CHLORIDE 0.9 % IV SOLN
INTRAVENOUS | Status: DC
  Administered 2016-10-18: 14:00:00 via INTRAVENOUS

## 2016-10-18 MED ORDER — EPHEDRINE SULFATE 50 MG/ML IJ SOLN
INTRAMUSCULAR | Status: DC | PRN
  Administered 2016-10-18 (×3): 10 mg via INTRAVENOUS

## 2016-10-18 MED ORDER — PROPOFOL 500 MG/50ML IV EMUL
INTRAVENOUS | Status: AC
Start: 2016-10-18 — End: 2016-10-18
  Filled 2016-10-18: qty 50

## 2016-10-18 MED ORDER — SODIUM CHLORIDE 0.9 % IV SOLN: INTRAVENOUS | Status: DC

## 2016-10-18 MED ORDER — PROPOFOL 500 MG/50ML IV EMUL
INTRAVENOUS | Status: DC | PRN
  Administered 2016-10-18: 125 ug/kg/min via INTRAVENOUS

## 2016-10-18 MED ORDER — SODIUM CHLORIDE 0.9 % IJ SOLN
INTRAMUSCULAR | Status: AC
  Filled 2016-10-18: qty 10

## 2016-10-18 MED ORDER — PROPOFOL 10 MG/ML IV BOLUS
INTRAVENOUS | Status: DC | PRN
  Administered 2016-10-18: 50 mg via INTRAVENOUS
  Administered 2016-10-18: 20 mg via INTRAVENOUS

## 2016-10-18 MED ORDER — PIPERACILLIN-TAZOBACTAM 3.375 G IVPB 30 MIN
3.3750 g | Freq: Once | INTRAVENOUS | Status: AC
  Administered 2016-10-18: 3.375 g via INTRAVENOUS
  Filled 2016-10-18: qty 50

## 2016-10-18 MED ORDER — EPHEDRINE SULFATE 50 MG/ML IJ SOLN
INTRAMUSCULAR | Status: AC
  Filled 2016-10-18: qty 1

## 2016-10-18 MED ORDER — PROPOFOL 500 MG/50ML IV EMUL
INTRAVENOUS | Status: AC
  Filled 2016-10-18: qty 50

## 2016-10-18 MED ORDER — SODIUM CHLORIDE 0.9 % IV SOLN
INTRAVENOUS | Status: DC
  Administered 2016-10-18: 1000 mL via INTRAVENOUS

## 2016-10-18 MED ORDER — POLYETHYLENE GLYCOL 3350 17 GM/SCOOP PO POWD
1.0000 | Freq: Once | ORAL | Status: AC
  Administered 2016-10-18: 255 g via ORAL
  Filled 2016-10-18 (×2): qty 255

## 2016-10-18 MED ORDER — PROPOFOL 10 MG/ML IV BOLUS
INTRAVENOUS | Status: AC
  Filled 2016-10-18: qty 20

## 2016-10-18 NOTE — Transfer of Care (Signed)
Immediate Anesthesia Transfer of Care Note  Patient: Dorothy Gross  Procedure(s) Performed: Procedure(s): COLONOSCOPY WITH PROPOFOL (N/A)  Patient Location: PACU  Anesthesia Type:General  Level of Consciousness: awake and patient cooperative  Airway & Oxygen Therapy: Patient Spontanous Breathing and Patient connected to nasal cannula oxygen  Post-op Assessment: Report given to RN and Post -op Vital signs reviewed and stable  Post vital signs: Reviewed and stable  Last Vitals:  Vitals:   10/18/16 0755 10/18/16 1537  BP: 130/62 (!) 109/54  Pulse: 74 63  Resp: 18 (!) 29  Temp: 37.3 C     Last Pain:  Vitals:   10/18/16 1537  TempSrc: Tympanic         Complications: No apparent anesthesia complications

## 2016-10-18 NOTE — Anesthesia Preprocedure Evaluation (Signed)
Anesthesia Evaluation  Patient identified by MRN, date of birth, ID band Patient awake    Reviewed: Allergy & Precautions, NPO status , Patient's Chart, lab work & pertinent test results, reviewed documented beta blocker date and time   Airway Mallampati: III  TM Distance: <3 FB Neck ROM: Full    Dental  (+) Chipped, Caps   Pulmonary former smoker,    Pulmonary exam normal breath sounds clear to auscultation       Cardiovascular hypertension, Pt. on medications and Pt. on home beta blockers + CAD and +CHF  Normal cardiovascular exam+ dysrhythmias Atrial Fibrillation   Hx of A flutter   Neuro/Psych negative neurological ROS  negative psych ROS   GI/Hepatic Neg liver ROS,   Endo/Other  diabetes, Well Controlled, Type 2, Oral Hypoglycemic Agents  Renal/GU negative Renal ROS  negative genitourinary   Musculoskeletal negative musculoskeletal ROS (+) Arthritis , Osteoarthritis,    Abdominal Normal abdominal exam  (+)   Peds negative pediatric ROS (+)  Hematology negative hematology ROS (+)   Anesthesia Other Findings Hx of atrial flutter  Reproductive/Obstetrics                             Anesthesia Physical  Anesthesia Plan  ASA: III  Anesthesia Plan: General   Post-op Pain Management:    Induction: Intravenous  Airway Management Planned: Nasal Cannula  Additional Equipment:   Intra-op Plan:   Post-operative Plan:   Informed Consent: I have reviewed the patients History and Physical, chart, labs and discussed the procedure including the risks, benefits and alternatives for the proposed anesthesia with the patient or authorized representative who has indicated his/her understanding and acceptance.   Dental advisory given  Plan Discussed with: CRNA and Surgeon  Anesthesia Plan Comments:         Anesthesia Quick Evaluation

## 2016-10-18 NOTE — H&P (Signed)
Primary Care Physician:  Maryland Pink, MD Primary Gastroenterologist:  Dr. Vira Agar  Pre-Procedure History & Physical: HPI:  Dorothy Gross is a 66 y.o. female is here for an colonoscopy.   Past Medical History:  Diagnosis Date  . A-fib ([place])   . Arthritis   . CAD (coronary artery disease)   . Cardiomyopathy ([place])   . CHF (congestive heart failure) ([place])   . Diabetes mellitus without complication ([place])   . Hyperlipidemia   . Hypertension     Past Surgical History:  Procedure Laterality Date  . ABDOMINAL HYSTERECTOMY    . APPENDECTOMY    . BREAST BIOPSY Right    negative 1974 negative 2015  . CHOLECYSTECTOMY N/A 12/05/2015   Procedure: LAPAROSCOPIC CHOLECYSTECTOMY WITH INTRAOPERATIVE CHOLANGIOGRAM;  Surgeon: Leonie Green, MD;  Location: [facility] ORS;  Service: General;  Laterality: N/A;  . JOINT REPLACEMENT     both knees  . TONSILLECTOMY      Prior to Admission medications   Medication Sig Start Date End Date Taking? Authorizing Provider  apixaban (ELIQUIS) 5 MG TABS tablet TAKE 1 TABLET (5 MG TOTAL) BY MOUTH 2 (TWO) TIMES DAILY. 05/21/15  Yes Historical Provider, MD  aspirin EC 81 MG tablet TAKE 1 TABLET BY MOUTH EVERY DAY 12/30/14  Yes Historical Provider, MD  Calcium Carbonate-Vit D-Min (CALCIUM 1200 PO) Take 1,200 mg by mouth daily.   Yes Historical Provider, MD  Cholecalciferol (VITAMIN D3) 1000 units CAPS Take 1,000 Units by mouth daily.    Yes Historical Provider, MD  docusate sodium (DULCOLAX) 100 MG capsule Take 100 mg by mouth daily.   Yes Historical Provider, MD  EQL NATURAL ZINC 50 MG TABS Take 50 mg by mouth daily.    Yes Historical Provider, MD  FIBER SELECT GUMMIES PO Take 2 tablets by mouth daily.   Yes Historical Provider, MD  Flaxseed, Linseed, (FLAX SEED OIL PO) Take 2 capsules by mouth daily.   Yes Historical Provider, MD  furosemide (LASIX) 40 MG tablet TAKE 1 TABLET BY MOUTH EVERY DAY 07/17/15  Yes Historical Provider, MD  Lancets MISC  08/14/15   Yes Historical Provider, MD  losartan (COZAAR) 50 MG tablet Take 50 mg by mouth daily.  07/21/15  Yes Historical Provider, MD  Multiple Vitamins-Minerals (MULTIVITAMIN ADULT PO) Take 1 tablet by mouth daily.   Yes Historical Provider, MD  simvastatin (ZOCOR) 20 MG tablet TAKE 1 TABLET BY MOUTH ONCE DAILY. 08/26/15  Yes Historical Provider, MD  traMADol (ULTRAM) 50 MG tablet Take 50 mg by mouth every 6 (six) hours as needed.  08/14/15  Yes Historical Provider, MD  amiodarone (PACERONE) 200 MG tablet TAKE 1 TABLET BY MOUTH EVERY DAY 08/20/15   Historical Provider, MD  carvedilol (COREG) 6.25 MG tablet TAKE 1 TABLET BY MOUTH TWICE A DAY 04/09/15   Historical Provider, MD  digoxin (LANOXIN) 0.125 MG tablet TAKE 1 TABLET BY MOUTH ONCE A DAY 06/19/15   Historical Provider, MD  glipiZIDE (GLUCOTROL XL) 2.5 MG 24 hr tablet Take 2.5 mg by mouth daily with breakfast.  08/14/15 08/13/16  Historical Provider, MD    Allergies as of 09/24/2016  . (No Known Allergies)    Family History  Problem Relation Age of Onset  . Diabetes Mother   . Diabetes Sister   . Diabetes Maternal Aunt   . Breast cancer Neg Hx     Social History   Social History  . Marital status: Divorced    Spouse name: N/A  .  Number of children: N/A  . Years of education: N/A   Occupational History  . Not on file.   Social History Main Topics  . Smoking status: Former Smoker    Packs/day: 0.25    Years: 15.00    Types: Cigarettes    Quit date: 08/31/2007  . Smokeless tobacco: Never Used  . Alcohol use No  . Drug use: No  . Sexual activity: Not on file   Other Topics Concern  . Not on file   Social History Narrative  . No narrative on file    Review of Systems: See HPI, otherwise negative ROS  Physical Exam: BP 130/62   Pulse 74   Temp 99.1 F (37.3 C) (Tympanic)   Resp 18   Ht '5\' 6"'$  (1.676 m)   Wt 90.7 kg (200 lb)   SpO2 95%   BMI 32.28 kg/m  General:   Alert,  pleasant and cooperative in NAD Head:   Normocephalic and atraumatic. Neck:  Supple; no masses or thyromegaly. Lungs:  Clear throughout to auscultation.    Heart:  Regular rate and rhythm. Abdomen:  Soft, nontender and nondistended. Normal bowel sounds, without guarding, and without rebound.   Neurologic:  Alert and  oriented x4;  grossly normal neurologically.  Impression/Plan: Dorothy Gross is here for an colonoscopy to be performed for screening   Risks, benefits, limitations, and alternatives regarding  colonoscopy have been reviewed with the patient.  Questions have been answered.  All parties agreeable.   Gaylyn Cheers, MD  10/18/2016, 1:17 PM

## 2016-10-18 NOTE — Op Note (Signed)
[facility] Patient Name: Dorothy Gross Procedure Date: 10/18/2016 2:13 PM MRN: [id number] Account #: [id number] Date of Birth: 24-Nov-1950 Admit Type: Outpatient Age: 66 Room: [facility] ENDO ROOM 1 Gender: Female Note Status: Finalized Procedure:            Colonoscopy Indications:          Screening for colorectal malignant neoplasm Providers:            Manya Silvas, MD Referring MD:         Irven Easterly. Kary Kos, MD (Referring MD) Medicines:            Propofol per Anesthesia Complications:        No immediate complications. Procedure:            Pre-Anesthesia Assessment:                       - After reviewing the risks and benefits, the patient                        was deemed in satisfactory condition to undergo the                        procedure.                       After obtaining informed consent, the colonoscope was                        passed under direct vision. Throughout the procedure,                        the patient's blood pressure, pulse, and oxygen                        saturations were monitored continuously. The                        Colonoscope was introduced through the anus and                        advanced to the the cecum, identified by appendiceal                        orifice and ileocecal valve. The colonoscopy was                        performed with difficulty due to restricted mobility of                        the colon, significant looping and a tortuous colon.                        Successful completion of the procedure was aided by                        applying abdominal pressure. The patient tolerated the                        procedure well. The quality of the bowel preparation  was excellent. Findings:      The colon was extremly tortuous and difficult to pass.      A large polyp was found in the proximal ascending colon. The polyp was       sessile. The polyp was  removed with a hot snare. Piecemeal Resection and       retrieval were complete. To prevent bleeding after the polypectomy, two       hemostatic clips were successfully placed. There was no bleeding at the       end of the procedure.      A 14 mm polyp was found in the proximal ascending colon. The polyp was       sessile. The polyp was removed with a hot snare. Resection and retrieval       were complete.      A small polyp was found in the cecum. The polyp was sessile. Argon       treated. Impression:           - One large polyp in the proximal ascending colon,                        removed with a hot snare. Resected and retrieved. Clips                        were placed.                       - One 14 mm polyp in the proximal ascending colon,                        removed with a hot snare. Resected and retrieved.                       - One small polyp in the cecum. Recommendation:       - Await pathology results. Manya Silvas, MD 10/18/2016 3:39:33 PM This report has been signed electronically. Number of Addenda: 0 Note Initiated On: 10/18/2016 2:13 PM Scope Withdrawal Time: 0 hours 24 minutes 56 seconds  Total Procedure Duration: 1 hour 10 minutes 13 seconds       [facility]

## 2016-10-18 NOTE — Anesthesia Post-op Follow-up Note (Cosign Needed)
Anesthesia QCDR form completed.        

## 2016-10-19 ENCOUNTER — Encounter: Payer: Self-pay | Admitting: Unknown Physician Specialty

## 2016-10-19 NOTE — Anesthesia Postprocedure Evaluation (Signed)
Anesthesia Post Note  Patient: Dorothy Gross  Procedure(s) Performed: Procedure(s) (LRB): COLONOSCOPY WITH PROPOFOL (N/A)  Patient location during evaluation: PACU Anesthesia Type: General Level of consciousness: awake and alert Pain management: pain level controlled Vital Signs Assessment: post-procedure vital signs reviewed and stable Respiratory status: spontaneous breathing, nonlabored ventilation, respiratory function stable and patient connected to nasal cannula oxygen Cardiovascular status: blood pressure returned to baseline and stable Postop Assessment: no signs of nausea or vomiting Anesthetic complications: no     Last Vitals:  Vitals:   10/18/16 1607 10/18/16 1617  BP: (!) 117/56 118/60  Pulse: 63 66  Resp: 17 18  Temp:      Last Pain:  Vitals:   10/19/16 0738  TempSrc:   PainSc: 0-No pain                 Molli Barrows

## 2016-10-20 LAB — SURGICAL PATHOLOGY

## 2016-11-27 IMAGING — US US CAROTID DUPLEX BILAT
1 series · 13 of 24 positions shown · non-contrast
Comparison: None.

CLINICAL DATA: Peripheral vascular disease

EXAM:
BILATERAL CAROTID DUPLEX ULTRASOUND
TECHNIQUE: Gray scale imaging, color Doppler and duplex ultrasound were
performed of bilateral carotid and vertebral arteries in the neck.

[Series 1: us carotid duplex bilat · 0.05mm/px · 13 of 69 slices shown]
[im 1/69]
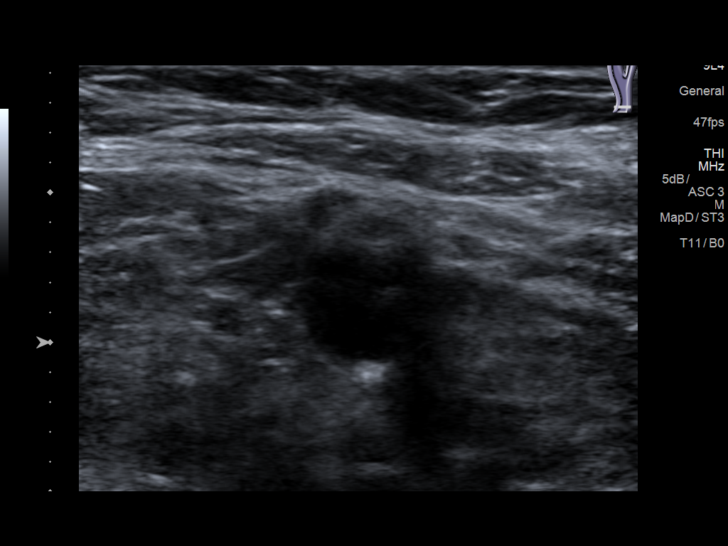
[im 6/69]
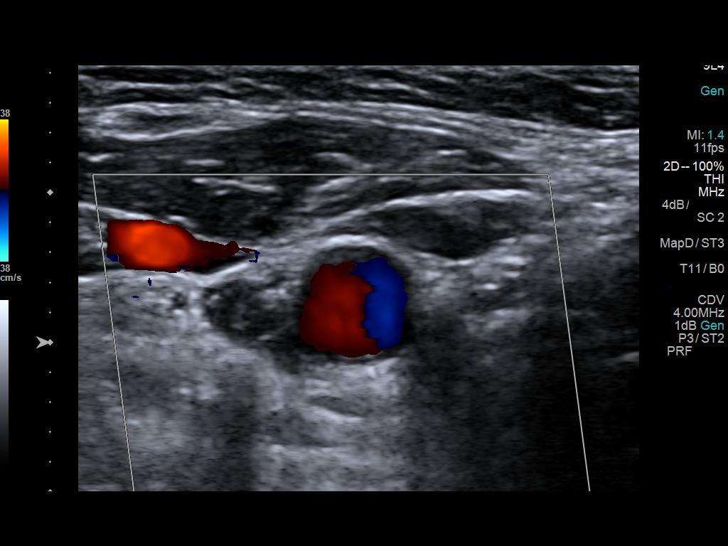
[im 12/69]
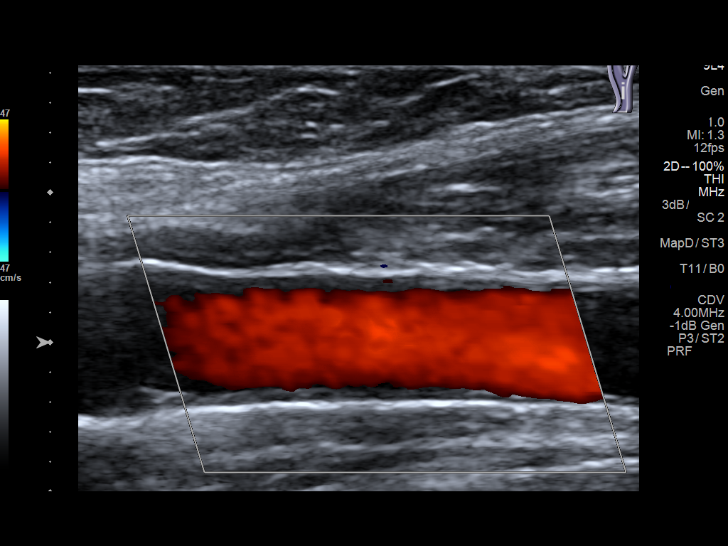
[im 18/69]
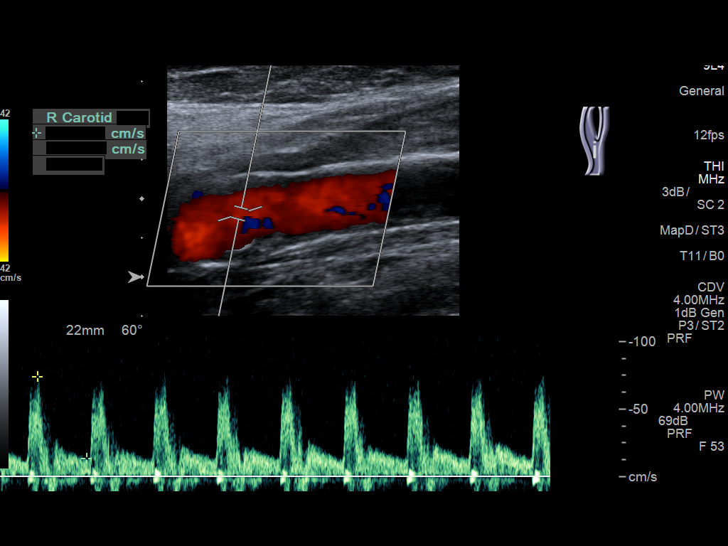
[im 24/69]
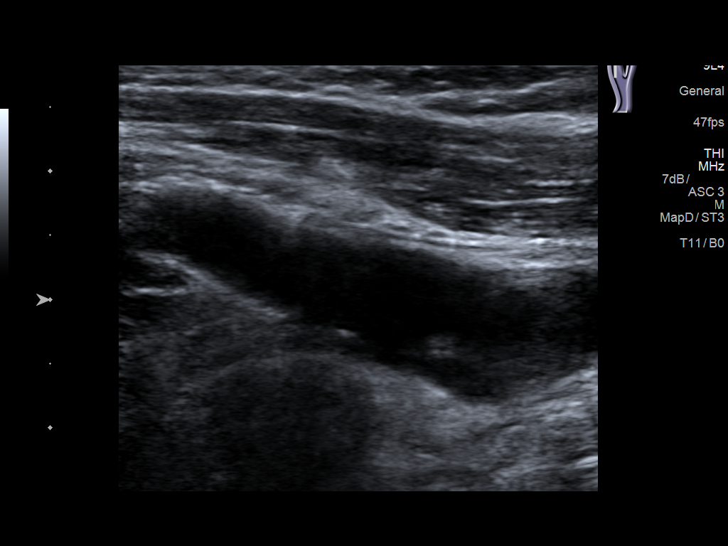
[im 30/69]
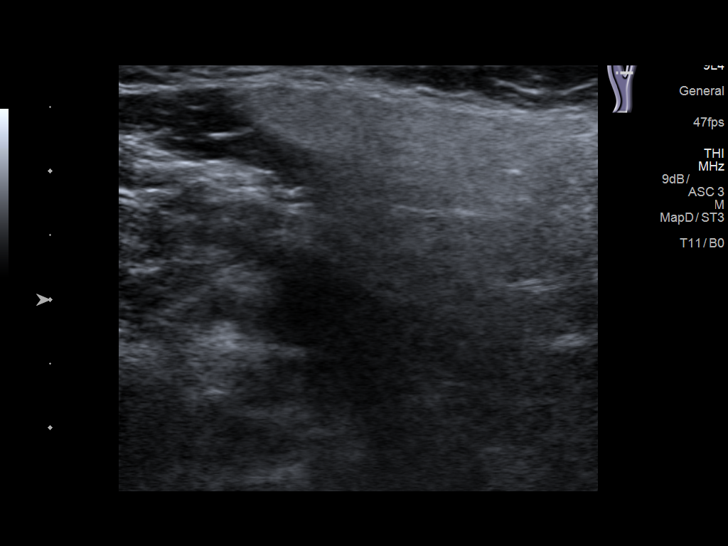
[im 36/69]
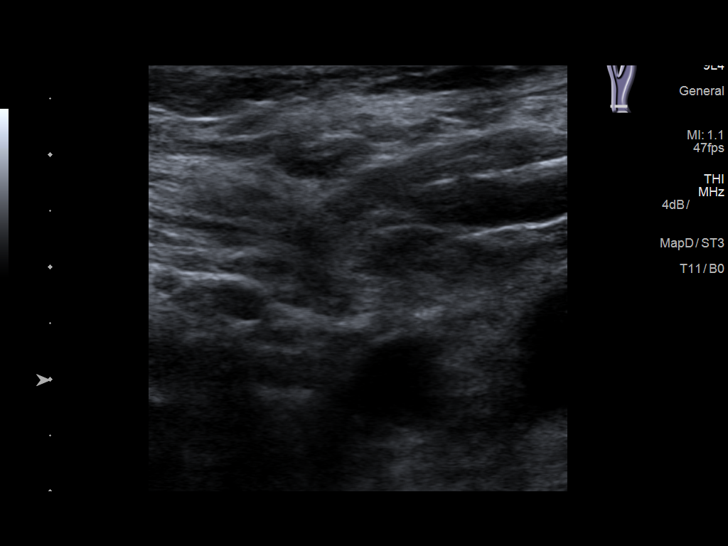
[im 39/69]
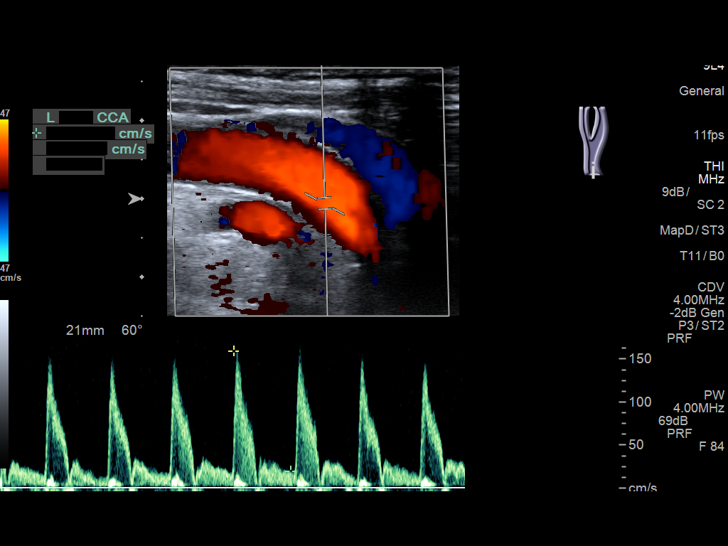
[im 45/69]
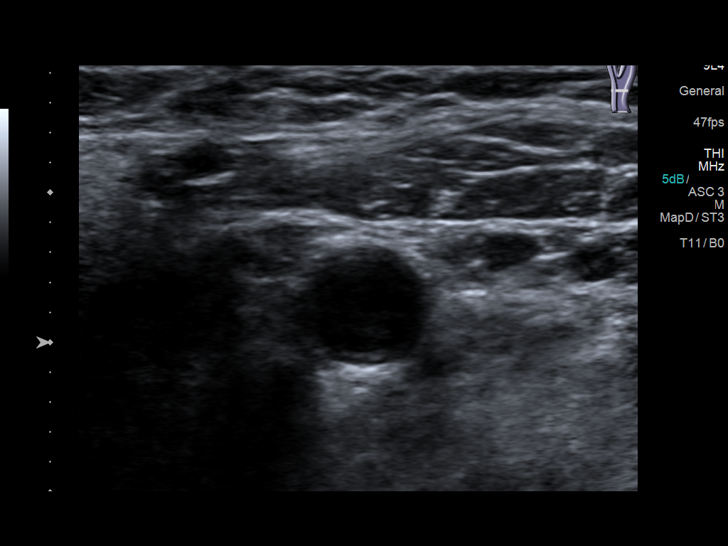
[im 51/69]
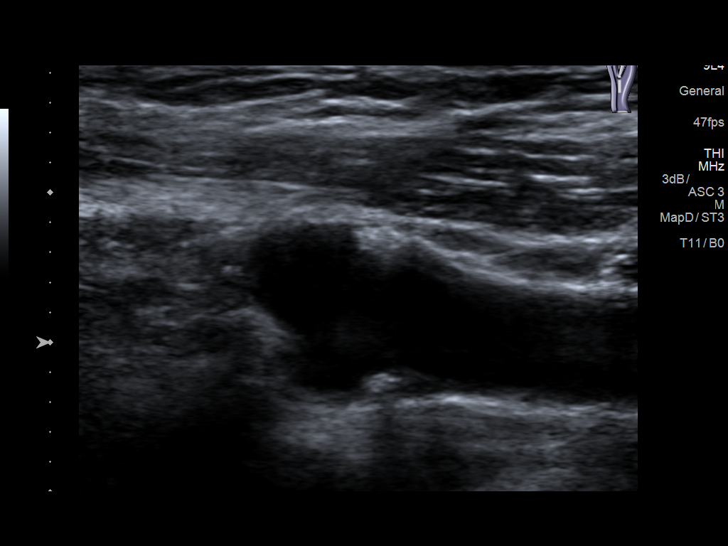
[im 57/69]
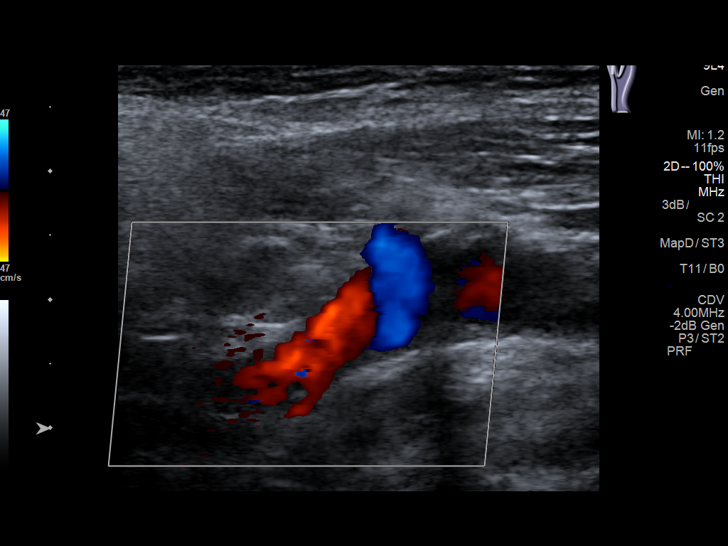
[im 63/69]
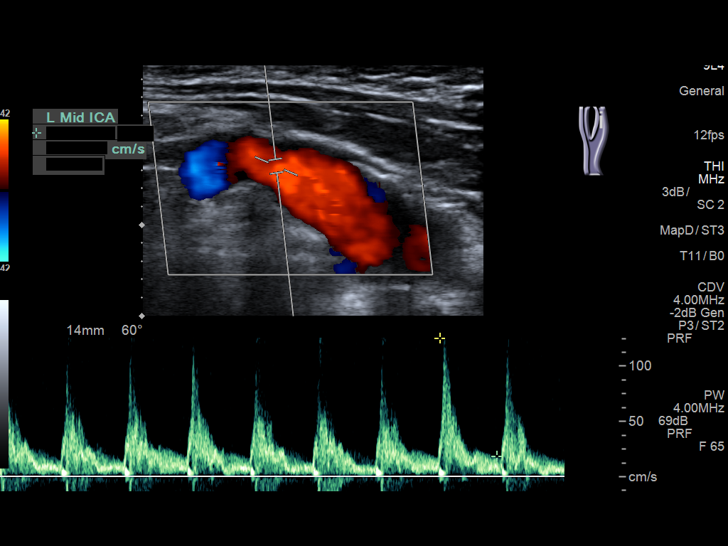
[im 69/69]
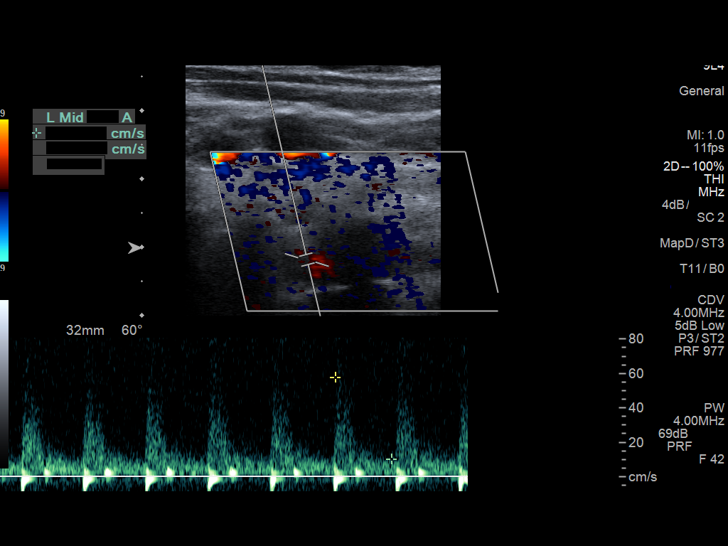

[13 of 24 positions shown; findings below may reference images not displayed]

FINDINGS: Criteria: Quantification of carotid stenosis is based on velocity
parameters that correlate the residual internal carotid diameter
with NASCET-based stenosis levels, using the diameter of the distal
internal carotid lumen as the denominator for stenosis measurement.

The following velocity measurements were obtained:

RIGHT

ICA:  103 cm/sec

CCA:  128 cm/sec

SYSTOLIC ICA/CCA RATIO:

DIASTOLIC ICA/CCA RATIO:

ECA:  156 cm/sec

LEFT

ICA:  125 cm/sec

CCA:  126 cm/sec

SYSTOLIC ICA/CCA RATIO:

DIASTOLIC ICA/CCA RATIO:

ECA:  152 cm/sec

RIGHT CAROTID ARTERY: There is mild focal calcified plaque in the
bulb. Low resistance internal carotid Doppler pattern is preserved.
Moderate tortuosity of the internal carotid artery.

RIGHT VERTEBRAL ARTERY:  Antegrade.

LEFT CAROTID ARTERY: Mild focal calcified plaque in the bulb.
Moderate tortuosity of the internal carotid artery. Low resistance
internal carotid Doppler pattern.

LEFT VERTEBRAL ARTERY:  Antegrade.
IMPRESSION: Less than 50% stenosis in the right and left internal carotid
arteries.

## 2017-06-07 ENCOUNTER — Other Ambulatory Visit: Payer: Self-pay | Admitting: Family Medicine

## 2017-06-07 DIAGNOSIS — Z1231 Encounter for screening mammogram for malignant neoplasm of breast: Secondary | ICD-10-CM

## 2017-07-15 ENCOUNTER — Ambulatory Visit
Admission: RE | Admit: 2017-07-15 | Discharge: 2017-07-15 | Disposition: A | Discharge status: Home / Self Care | Payer: BLUE CROSS/BLUE SHIELD | Source: Ambulatory Visit | Attending: Family Medicine | Admitting: Family Medicine

## 2017-07-15 DIAGNOSIS — Z1231 Encounter for screening mammogram for malignant neoplasm of breast: Secondary | ICD-10-CM | POA: Insufficient documentation

## 2017-07-15 IMAGING — MG 2D DIGITAL SCREENING BILATERAL MAMMOGRAM WITH CAD AND ADJUNCT TO
8 of 12 series · 8 of 28 positions shown · non-contrast
Comparison: Previous exam(s).

CLINICAL DATA: Screening.

EXAM:
2D DIGITAL SCREENING BILATERAL MAMMOGRAM WITH CAD AND ADJUNCT TOMO

[R CC]
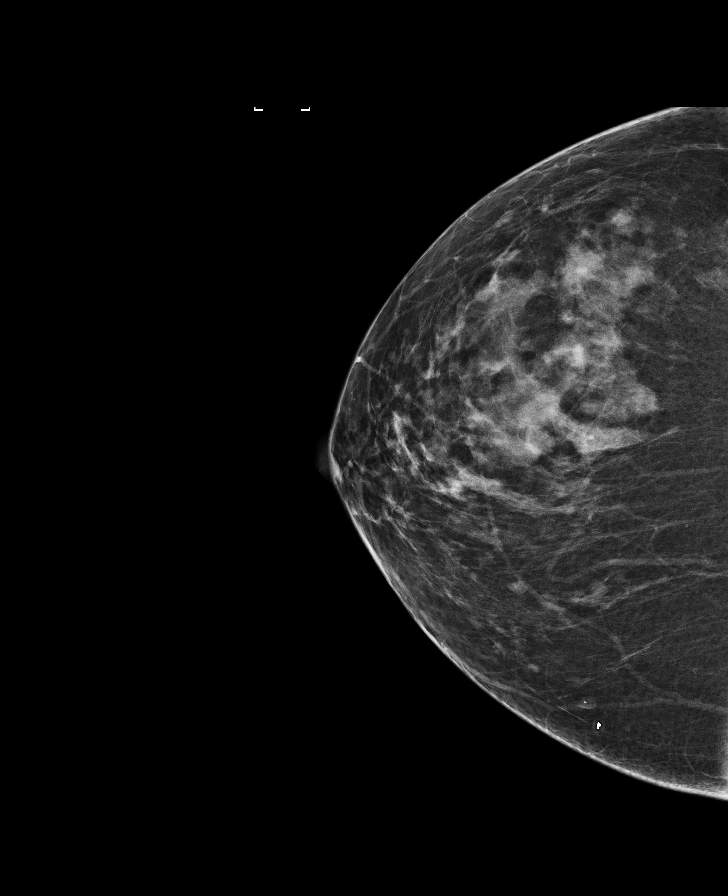

[R MLO synth-2D]
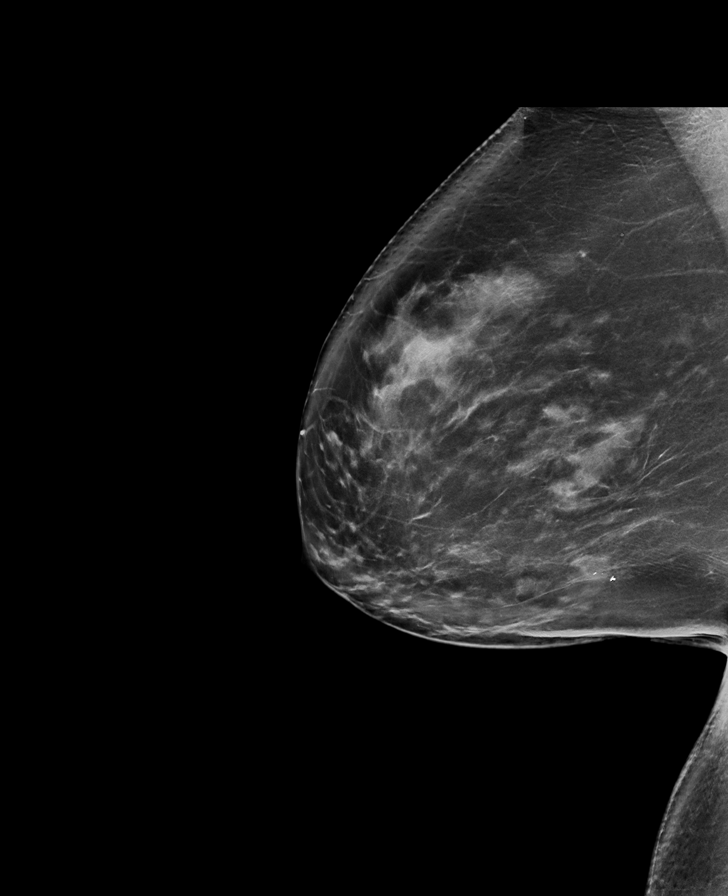

[R CC synth-2D]
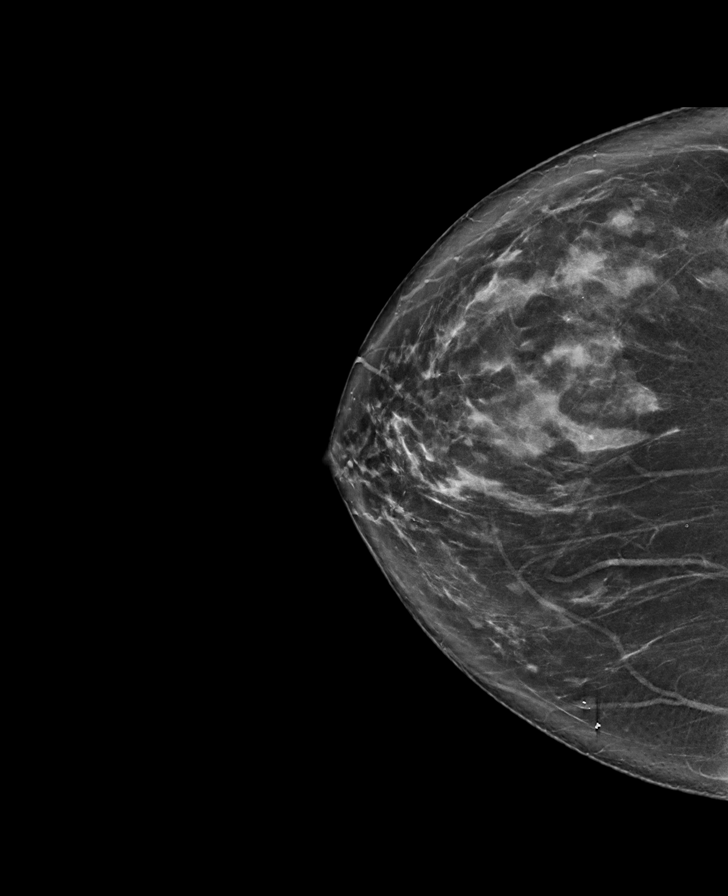

[L MLO]
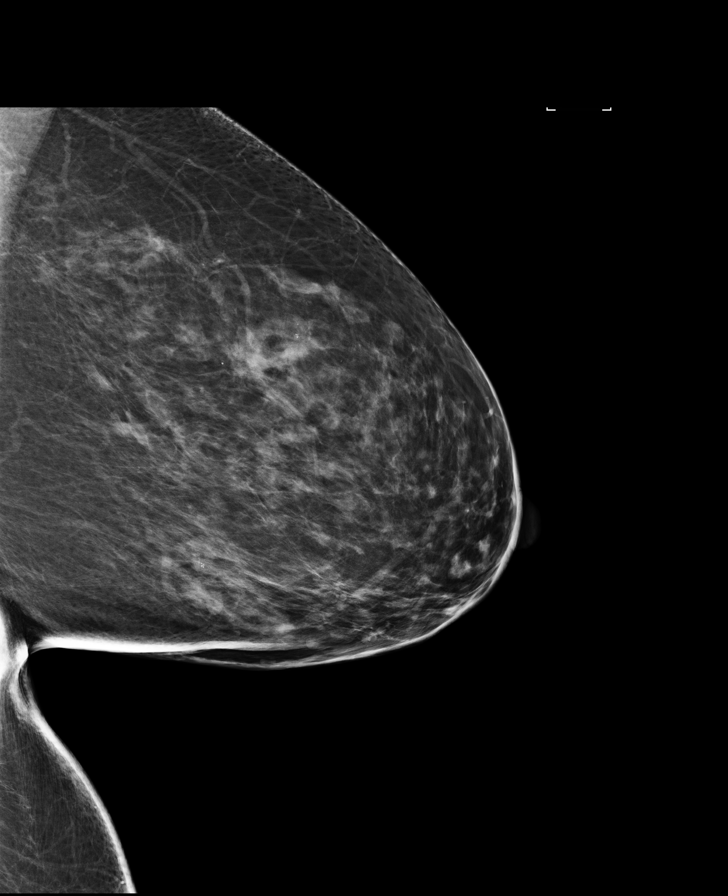

[L CC]
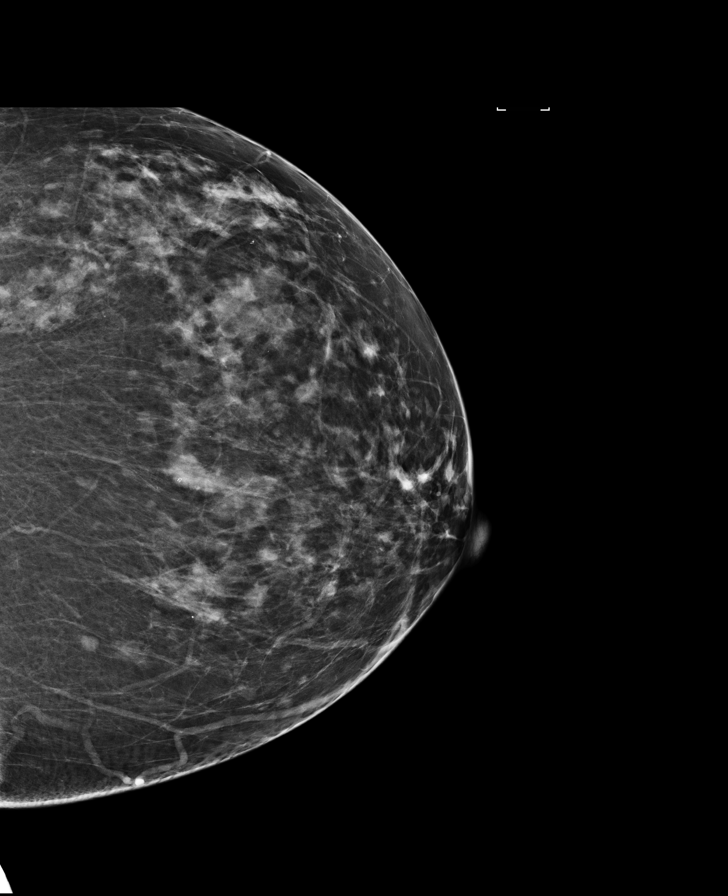

[L MLO synth-2D]
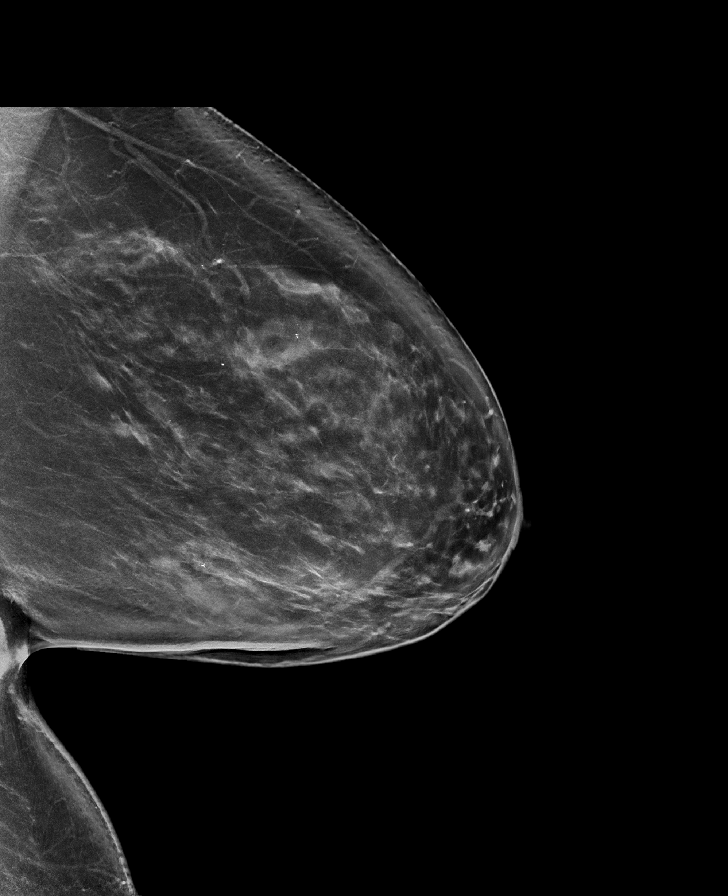

[L CC synth-2D]
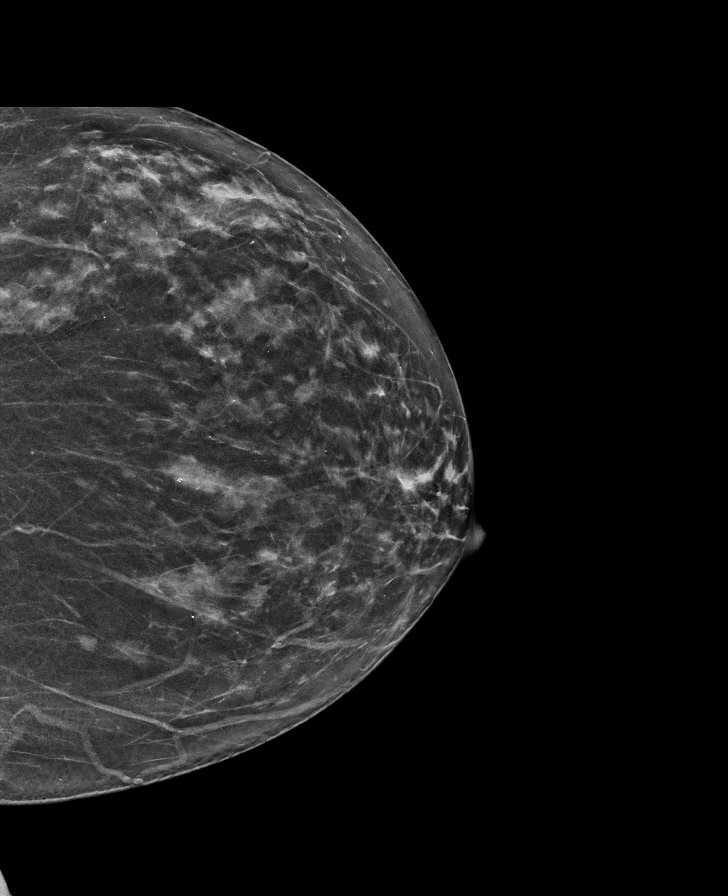

[R MLO]
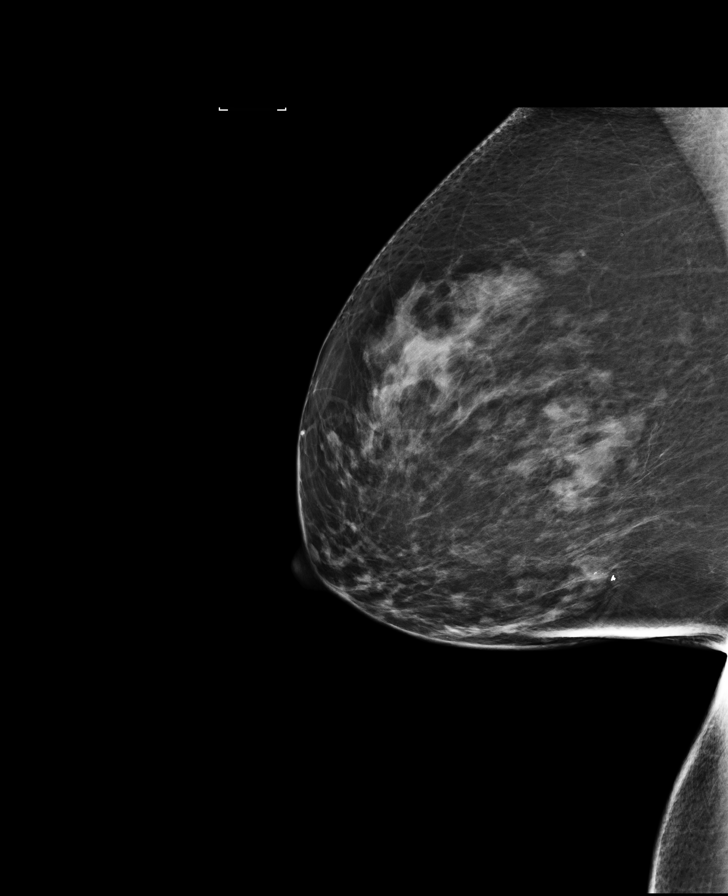

[8 of 28 positions shown; findings below may reference images not displayed]

ACR Breast Density Category c: The breast tissue is heterogeneously
dense, which may obscure small masses.
FINDINGS: There are no findings suspicious for malignancy. Images were
processed with CAD.
IMPRESSION: No mammographic evidence of malignancy. A result letter of this
screening mammogram will be mailed directly to the patient.

RECOMMENDATION:
Screening mammogram in one year. (Code:[TA])

BI-RADS CATEGORY  1: Negative.

## 2017-10-07 ENCOUNTER — Other Ambulatory Visit: Payer: Self-pay | Admitting: Family Medicine

## 2017-10-07 DIAGNOSIS — I739 Peripheral vascular disease, unspecified: Secondary | ICD-10-CM

## 2017-10-12 ENCOUNTER — Ambulatory Visit
Admission: RE | Admit: 2017-10-12 | Discharge: 2017-10-12 | Disposition: A | Discharge status: Home / Self Care | Payer: BLUE CROSS/BLUE SHIELD | Source: Ambulatory Visit | Attending: Family Medicine | Admitting: Family Medicine

## 2017-10-12 DIAGNOSIS — I739 Peripheral vascular disease, unspecified: Secondary | ICD-10-CM | POA: Diagnosis not present

## 2017-10-12 DIAGNOSIS — I6523 Occlusion and stenosis of bilateral carotid arteries: Secondary | ICD-10-CM | POA: Insufficient documentation

## 2018-03-17 ENCOUNTER — Ambulatory Visit
Admission: RE | Admit: 2018-03-17 | Discharge: 2018-03-17 | Disposition: A | Discharge status: Home / Self Care | Payer: BLUE CROSS/BLUE SHIELD | Source: Ambulatory Visit | Attending: Unknown Physician Specialty | Admitting: Unknown Physician Specialty

## 2018-03-17 ENCOUNTER — Encounter: Payer: Self-pay | Admitting: *Deleted

## 2018-03-17 ENCOUNTER — Encounter
Admission: RE | Disposition: A | Discharge status: Home / Self Care | Payer: Self-pay | Source: Ambulatory Visit | Attending: Unknown Physician Specialty

## 2018-03-17 ENCOUNTER — Ambulatory Visit: Payer: BLUE CROSS/BLUE SHIELD | Admitting: Anesthesiology

## 2018-03-17 DIAGNOSIS — Z87891 Personal history of nicotine dependence: Secondary | ICD-10-CM | POA: Insufficient documentation

## 2018-03-17 DIAGNOSIS — Z7984 Long term (current) use of oral hypoglycemic drugs: Secondary | ICD-10-CM | POA: Insufficient documentation

## 2018-03-17 DIAGNOSIS — Z96653 Presence of artificial knee joint, bilateral: Secondary | ICD-10-CM | POA: Insufficient documentation

## 2018-03-17 DIAGNOSIS — D122 Benign neoplasm of ascending colon: Secondary | ICD-10-CM | POA: Diagnosis not present

## 2018-03-17 DIAGNOSIS — K621 Rectal polyp: Secondary | ICD-10-CM | POA: Insufficient documentation

## 2018-03-17 DIAGNOSIS — E785 Hyperlipidemia, unspecified: Secondary | ICD-10-CM | POA: Diagnosis not present

## 2018-03-17 DIAGNOSIS — D123 Benign neoplasm of transverse colon: Secondary | ICD-10-CM | POA: Insufficient documentation

## 2018-03-17 DIAGNOSIS — Z1211 Encounter for screening for malignant neoplasm of colon: Secondary | ICD-10-CM | POA: Insufficient documentation

## 2018-03-17 DIAGNOSIS — Z86010 Personal history of colonic polyps: Secondary | ICD-10-CM | POA: Diagnosis not present

## 2018-03-17 DIAGNOSIS — Z79899 Other long term (current) drug therapy: Secondary | ICD-10-CM | POA: Insufficient documentation

## 2018-03-17 DIAGNOSIS — Z7982 Long term (current) use of aspirin: Secondary | ICD-10-CM | POA: Diagnosis not present

## 2018-03-17 DIAGNOSIS — Z7901 Long term (current) use of anticoagulants: Secondary | ICD-10-CM | POA: Diagnosis not present

## 2018-03-17 DIAGNOSIS — E119 Type 2 diabetes mellitus without complications: Secondary | ICD-10-CM | POA: Diagnosis not present

## 2018-03-17 DIAGNOSIS — D120 Benign neoplasm of cecum: Secondary | ICD-10-CM | POA: Insufficient documentation

## 2018-03-17 DIAGNOSIS — I4891 Unspecified atrial fibrillation: Secondary | ICD-10-CM | POA: Diagnosis not present

## 2018-03-17 DIAGNOSIS — I509 Heart failure, unspecified: Secondary | ICD-10-CM | POA: Diagnosis not present

## 2018-03-17 DIAGNOSIS — I110 Hypertensive heart disease with heart failure: Secondary | ICD-10-CM | POA: Insufficient documentation

## 2018-03-17 DIAGNOSIS — I2510 Atherosclerotic heart disease of native coronary artery without angina pectoris: Secondary | ICD-10-CM | POA: Diagnosis not present

## 2018-03-17 HISTORY — PX: COLONOSCOPY WITH PROPOFOL: SHX5780

## 2018-03-17 LAB — GLUCOSE, CAPILLARY: Glucose-Capillary: 126 mg/dL — ABNORMAL HIGH (ref 70–99)

## 2018-03-17 SURGERY — COLONOSCOPY WITH PROPOFOL
Anesthesia: General

## 2018-03-17 MED ORDER — SODIUM CHLORIDE 0.9 % IV SOLN: INTRAVENOUS | Status: DC

## 2018-03-17 MED ORDER — PROPOFOL 500 MG/50ML IV EMUL
INTRAVENOUS | Status: DC | PRN
  Administered 2018-03-17: 125 ug/kg/min via INTRAVENOUS

## 2018-03-17 MED ORDER — PROPOFOL 500 MG/50ML IV EMUL
INTRAVENOUS | Status: AC
  Filled 2018-03-17: qty 50

## 2018-03-17 MED ORDER — LIDOCAINE HCL (PF) 1 % IJ SOLN
2.0000 mL | Freq: Once | INTRAMUSCULAR | Status: AC
  Administered 2018-03-17: 0.3 mL via INTRADERMAL

## 2018-03-17 MED ORDER — PROPOFOL 10 MG/ML IV BOLUS
INTRAVENOUS | Status: DC | PRN
  Administered 2018-03-17: 25 mg via INTRAVENOUS
  Administered 2018-03-17: 50 mg via INTRAVENOUS
  Administered 2018-03-17: 25 mg via INTRAVENOUS

## 2018-03-17 MED ORDER — LIDOCAINE HCL (PF) 1 % IJ SOLN
INTRAMUSCULAR | Status: AC
  Administered 2018-03-17: 0.3 mL via INTRADERMAL
  Filled 2018-03-17: qty 2

## 2018-03-17 MED ORDER — SODIUM CHLORIDE 0.9 % IV SOLN
INTRAVENOUS | Status: DC
  Administered 2018-03-17: 1000 mL via INTRAVENOUS

## 2018-03-17 MED ORDER — PIPERACILLIN-TAZOBACTAM 3.375 G IVPB 30 MIN
3.3750 g | Freq: Once | INTRAVENOUS | Status: AC
  Administered 2018-03-17: 3.375 g via INTRAVENOUS
  Filled 2018-03-17: qty 50

## 2018-03-17 NOTE — Op Note (Signed)
[facility] Gastroenterology Patient Name: Dorothy Gross Procedure Date: 03/17/2018 7:33 AM MRN: [id number] Account #: [id number] Date of Birth: 1951-01-16 Admit Type: Outpatient Age: 67 Room: [facility] ENDO ROOM 3 Gender: Female Note Status: Finalized Procedure:            Colonoscopy Indications:          High risk colon cancer surveillance: Personal history                        of colonic polyps Providers:            Manya Silvas, MD Referring MD:         Irven Easterly. Kary Kos, MD (Referring MD) Medicines:            Propofol per Anesthesia Complications:        No immediate complications. Procedure:            Pre-Anesthesia Assessment:                       - After reviewing the risks and benefits, the patient                        was deemed in satisfactory condition to undergo the                        procedure.                       After obtaining informed consent, the colonoscope was                        passed under direct vision. Throughout the procedure,                        the patient's blood pressure, pulse, and oxygen                        saturations were monitored continuously. The                        Colonoscope was introduced through the anus and                        advanced to the the cecum, identified by appendiceal                        orifice and ileocecal valve. The colonoscopy was                        performed without difficulty. The patient tolerated the                        procedure well. The quality of the bowel preparation                        was good. Findings:      A diminutive polyp was found in the cecum. The polyp was sessile. The       polyp was removed with a hot snare. Resection and retrieval were       complete.      Two sessile polyps were found in  the ascending colon. The polyps were       diminutive in size. These polyps were removed with a hot snare.       Resection and retrieval were  complete.      A diminutive polyp was found in the ascending colon. The polyp was       sessile. The polyp was removed with a cold snare. Resection and       retrieval were complete.      A diminutive polyp was found in the ascending colon. The polyp was       sessile. The polyp was removed with a jumbo cold forceps. Resection and       retrieval were complete.      A small polyp was found in the hepatic flexure. The polyp was sessile.       The polyp was removed with a hot snare. Resection and retrieval were       complete.      A diminutive polyp was found in the splenic flexure. The polyp was       sessile. The polyp was removed with a hot snare. Resection and retrieval       were complete.      A diminutive polyp was found in the rectum. The polyp was sessile. The       polyp was removed with a jumbo cold forceps. Resection and retrieval       were complete. Impression:           - One diminutive polyp in the cecum, removed with a hot                        snare. Resected and retrieved.                       - Two diminutive polyps in the ascending colon, removed                        with a hot snare. Resected and retrieved.                       - One diminutive polyp in the ascending colon, removed                        with a cold snare. Resected and retrieved.                       - One diminutive polyp in the ascending colon, removed                        with a jumbo cold forceps. Resected and retrieved.                       - One small polyp at the hepatic flexure, removed with                        a hot snare. Resected and retrieved.                       - One diminutive polyp at the splenic flexure, removed                        with  a hot snare. Resected and retrieved.                       - One diminutive polyp in the rectum, removed with a                        jumbo cold forceps. Resected and retrieved. Recommendation:       - Await pathology  results. Manya Silvas, MD 03/17/2018 8:21:55 AM This report has been signed electronically. Number of Addenda: 0 Note Initiated On: 03/17/2018 7:33 AM Scope Withdrawal Time: 0 hours 23 minutes 47 seconds  Total Procedure Duration: 0 hours 37 minutes 44 seconds       [facility]

## 2018-03-17 NOTE — H&P (Signed)
Primary Care Physician:  Maryland Pink, MD Primary Gastroenterologist:  Dr. Vira Agar  Pre-Procedure History & Physical: HPI:  Dorothy Gross is a 67 y.o. female is here for an colonoscopy.  Done for follow up colon polyps.   Past Medical History:  Diagnosis Date  . A-fib ([place])   . Arthritis   . CAD (coronary artery disease)   . Cardiomyopathy ([place] )   . CHF (congestive heart failure) ([place])   . Diabetes mellitus without complication ([place])   . Hyperlipidemia   . Hypertension     Past Surgical History:  Procedure Laterality Date  . ABDOMINAL HYSTERECTOMY    . APPENDECTOMY    . BREAST BIOPSY Right    negative 1974 negative 2015  . CHOLECYSTECTOMY N/A 12/05/2015   Procedure: LAPAROSCOPIC CHOLECYSTECTOMY WITH INTRAOPERATIVE CHOLANGIOGRAM;  Surgeon: Leonie Green, MD;  Location: [facility] ORS;  Service: General;  Laterality: N/A;  . COLONOSCOPY WITH PROPOFOL N/A 10/18/2016   Procedure: COLONOSCOPY WITH PROPOFOL;  Surgeon: Manya Silvas, MD;  Location: [facility] ENDOSCOPY;  Service: Endoscopy;  Laterality: N/A;  . JOINT REPLACEMENT     both knees  . TONSILLECTOMY      Prior to Admission medications   Medication Sig Start Date End Date Taking? Authorizing Provider  amiodarone (PACERONE) 200 MG tablet TAKE 1 TABLET BY MOUTH EVERY DAY 08/20/15   [provider]  apixaban (ELIQUIS) 5 MG TABS tablet TAKE 1 TABLET (5 MG TOTAL) BY MOUTH 2 (TWO) TIMES DAILY. 05/21/15   [provider]  aspirin EC 81 MG tablet TAKE 1 TABLET BY MOUTH EVERY DAY 12/30/14   [provider]  Calcium Carbonate-Vit D-Min (CALCIUM 1200 PO) Take 1,200 mg by mouth daily.    [provider]  carvedilol (COREG) 6.25 MG tablet TAKE 1 TABLET BY MOUTH TWICE A DAY 04/09/15   [provider]  Cholecalciferol (VITAMIN D3) 1000 units CAPS Take 1,000 Units by mouth daily.     [provider]  digoxin (LANOXIN) 0.125 MG tablet TAKE 1 TABLET BY MOUTH ONCE A DAY 06/19/15    [provider]  docusate sodium (DULCOLAX) 100 MG capsule Take 100 mg by mouth daily.    [provider]  EQL NATURAL ZINC 50 MG TABS Take 50 mg by mouth daily.     [provider]  FIBER SELECT GUMMIES PO Take 2 tablets by mouth daily.    [provider]  Flaxseed, Linseed, (FLAX SEED OIL PO) Take 2 capsules by mouth daily.    [provider]  furosemide (LASIX) 40 MG tablet TAKE 1 TABLET BY MOUTH EVERY DAY 07/17/15   [provider]  glipiZIDE (GLUCOTROL XL) 2.5 MG 24 hr tablet Take 2.5 mg by mouth daily with breakfast.  08/14/15 08/13/16  [provider]  Lancets MISC  08/14/15   [provider]  losartan (COZAAR) 50 MG tablet Take 50 mg by mouth daily.  07/21/15   [provider]  Multiple Vitamins-Minerals (MULTIVITAMIN ADULT PO) Take 1 tablet by mouth daily.    [provider]  simvastatin (ZOCOR) 20 MG tablet TAKE 1 TABLET BY MOUTH ONCE DAILY. 08/26/15   [provider]  traMADol (ULTRAM) 50 MG tablet Take 50 mg by mouth every 6 (six) hours as needed.  08/14/15   [provider]    Allergies as of 12/14/2017  . (No Known Allergies)    Family History  Problem Relation Age of Onset  . Diabetes Mother   . Diabetes Sister   .  Diabetes Maternal Aunt   . Breast cancer Neg Hx     Social History   Socioeconomic History  . Marital status: Divorced    Spouse name: Not on file  . Number of children: Not on file  . Years of education: Not on file  . Highest education level: Not on file  Occupational History  . Not on file  Social Needs  . Financial resource strain: Not on file  . Food insecurity:    Worry: Not on file    Inability: Not on file  . Transportation needs:    Medical: Not on file    Non-medical: Not on file  Tobacco Use  . Smoking status: Former Smoker    Packs/day: 0.25    Years: 15.00    Pack years: 3.75    Types: Cigarettes    Last attempt to quit:  08/31/2007    Years since quitting: 10.5  . Smokeless tobacco: Never Used  Substance and Sexual Activity  . Alcohol use: No    Alcohol/week: 0.0 oz  . Drug use: No  . Sexual activity: Not on file  Lifestyle  . Physical activity:    Days per week: Not on file    Minutes per session: Not on file  . Stress: Not on file  Relationships  . Social connections:    Talks on phone: Not on file    Gets together: Not on file    Attends religious service: Not on file    Active member of club or organization: Not on file    Attends meetings of clubs or organizations: Not on file    Relationship status: Not on file  . Intimate partner violence:    Fear of current or ex partner: Not on file    Emotionally abused: Not on file    Physically abused: Not on file    Forced sexual activity: Not on file  Other Topics Concern  . Not on file  Social History Narrative  . Not on file    Review of Systems: See HPI, otherwise negative ROS  Physical Exam: BP 137/64   Pulse 63   Temp (!) 96.5 F (35.8 C) (Tympanic)   Resp 17   Ht 5\' 6"  (1.676 m)   Wt 90.7 kg (200 lb)   SpO2 96%   BMI 32.28 kg/m  General:   Alert,  pleasant and cooperative in NAD Head:  Normocephalic and atraumatic. Neck:  Supple; no masses or thyromegaly. Lungs:  Clear throughout to auscultation.    Heart:  Regular rate and rhythm. Abdomen:  Soft, nontender and nondistended. Normal bowel sounds, without guarding, and without rebound.   Neurologic:  Alert and  oriented x4;  grossly normal neurologically.  Impression/Plan: Dorothy Gross is here for an colonoscopy to be performed for [facility] colon polyps.  Risks, benefits, limitations, and alternatives regarding  colonoscopy have been reviewed with the patient.  Questions have been answered.  All parties agreeable.   Gaylyn Cheers, MD  03/17/2018, 7:31 AM

## 2018-03-17 NOTE — Anesthesia Postprocedure Evaluation (Signed)
Anesthesia Post Note  Patient: Dorothy Gross  Procedure(s) Performed: COLONOSCOPY WITH PROPOFOL (N/A )  Patient location during evaluation: Endoscopy Anesthesia Type: General Level of consciousness: awake and alert Pain management: pain level controlled Vital Signs Assessment: post-procedure vital signs reviewed and stable Respiratory status: spontaneous breathing, nonlabored ventilation, respiratory function stable and patient connected to nasal cannula oxygen Cardiovascular status: blood pressure returned to baseline and stable Postop Assessment: no apparent nausea or vomiting Anesthetic complications: no     Last Vitals:  Vitals:   03/17/18 0840 03/17/18 0850  BP: 106/62 (!) 118/52  Pulse: (!) 53 (!) 50  Resp: 15 15  Temp:    SpO2: 98% 98%    Last Pain:  Vitals:   03/17/18 0810  TempSrc: Tympanic  PainSc:                  Precious Haws Pieter [place]

## 2018-03-17 NOTE — Anesthesia Post-op Follow-up Note (Signed)
Anesthesia QCDR form completed.        

## 2018-03-17 NOTE — Anesthesia Preprocedure Evaluation (Signed)
Anesthesia Evaluation  Patient identified by MRN, date of birth, ID band Patient awake    Reviewed: Allergy & Precautions, H&P , NPO status , Patient's Chart, lab work & pertinent test results  History of Anesthesia Complications Negative for: history of anesthetic complications  Airway Mallampati: III  TM Distance: <3 FB Neck ROM: limited    Dental  (+) Chipped, Poor Dentition, Missing   Pulmonary neg shortness of breath, former smoker,           Cardiovascular Exercise Tolerance: Good hypertension, + CAD and +CHF  (-) Past MI and (-) Cardiac Stents + dysrhythmias Atrial Fibrillation      Neuro/Psych negative neurological ROS  negative psych ROS   GI/Hepatic negative GI ROS, Neg liver ROS,   Endo/Other  negative endocrine ROSdiabetes, Type 2  Renal/GU negative Renal ROS  negative genitourinary   Musculoskeletal   Abdominal   Peds  Hematology negative hematology ROS (+)   Anesthesia Other Findings Past Medical History: No date: A-fib ([facility]) No date: Arthritis No date: CAD (coronary artery disease) No date: Cardiomyopathy ([place]) No date: CHF (congestive heart failure) (H[facility]) No date: Diabetes mellitus without complication (H[facility]) No date: Hyperlipidemia No date: Hypertension  Past Surgical History: No date: ABDOMINAL HYSTERECTOMY No date: APPENDECTOMY No date: BREAST BIOPSY; Right     Comment:  negative 1974 negative 2015 12/05/2015: CHOLECYSTECTOMY; N/A     Comment:  Procedure: LAPAROSCOPIC CHOLECYSTECTOMY WITH               INTRAOPERATIVE CHOLANGIOGRAM;  Surgeon: Leonie Green, MD;  Location: [facility] ORS;  Service: General;                Laterality: N/A; 10/18/2016: COLONOSCOPY WITH PROPOFOL; N/A     Comment:  Procedure: COLONOSCOPY WITH PROPOFOL;  Surgeon: Manya Silvas, MD;  Location: [facility] ENDOSCOPY;  Service:               Endoscopy;  Laterality: N/A; No date: JOINT  REPLACEMENT     Comment:  both knees No date: TONSILLECTOMY     Reproductive/Obstetrics negative OB ROS                             Anesthesia Physical Anesthesia Plan  ASA: III  Anesthesia Plan: General   Post-op Pain Management:    Induction: Intravenous  PONV Risk Score and Plan: Propofol infusion and TIVA  Airway Management Planned: Natural Airway and Nasal Cannula  Additional Equipment:   Intra-op Plan:   Post-operative Plan:   Informed Consent: I have reviewed the patients History and Physical, chart, labs and discussed the procedure including the risks, benefits and alternatives for the proposed anesthesia with the patient or authorized representative who has indicated his/her understanding and acceptance.   Dental Advisory Given  Plan Discussed with: Anesthesiologist, CRNA and Surgeon  Anesthesia Plan Comments: (Patient consented for risks of anesthesia including but not limited to:  - adverse reactions to medications - risk of intubation if required - damage to teeth, lips or other oral mucosa - sore throat or hoarseness - Damage to heart, brain, lungs or loss of life  Patient voiced understanding.)        Anesthesia Quick Evaluation

## 2018-03-17 NOTE — Transfer of Care (Signed)
Immediate Anesthesia Transfer of Care Note  Patient: Dorothy Gross  Procedure(s) Performed: COLONOSCOPY WITH PROPOFOL (N/A )  Patient Location: PACU and Endoscopy Unit  Anesthesia Type:General  Level of Consciousness: awake, alert  and oriented  Airway & Oxygen Therapy: Patient Spontanous Breathing  Post-op Assessment: Report given to RN and Post -op Vital signs reviewed and stable  Post vital signs: Reviewed and stable  Last Vitals:  Vitals Value Taken Time  BP 88/49 03/17/2018  8:19 AM  Temp 36.2 C 03/17/2018  8:10 AM  Pulse 53 03/17/2018  8:19 AM  Resp 20 03/17/2018  8:19 AM  SpO2 97 % 03/17/2018  8:19 AM  Vitals shown include unvalidated device data.  Last Pain:  Vitals:   03/17/18 0810  TempSrc: Tympanic  PainSc:          Complications: No apparent anesthesia complications

## 2018-03-20 LAB — SURGICAL PATHOLOGY

## 2018-03-29 ENCOUNTER — Other Ambulatory Visit
Admission: RE | Admit: 2018-03-29 | Discharge: 2018-03-29 | Disposition: A | Discharge status: Home / Self Care | Payer: BLUE CROSS/BLUE SHIELD | Source: Ambulatory Visit | Attending: Student | Admitting: Student

## 2018-03-29 DIAGNOSIS — R197 Diarrhea, unspecified: Secondary | ICD-10-CM | POA: Insufficient documentation

## 2018-03-29 LAB — GASTROINTESTINAL PANEL BY PCR, STOOL (REPLACES STOOL CULTURE)
ASTROVIRUS: NOT DETECTED
Adenovirus F40/41: NOT DETECTED
CAMPYLOBACTER SPECIES: NOT DETECTED
Cryptosporidium: NOT DETECTED
Cyclospora cayetanensis: NOT DETECTED
ENTAMOEBA HISTOLYTICA: NOT DETECTED
ENTEROAGGREGATIVE E COLI (EAEC): NOT DETECTED
Enteropathogenic E coli (EPEC): NOT DETECTED
Enterotoxigenic E coli (ETEC): NOT DETECTED
GIARDIA LAMBLIA: NOT DETECTED
NOROVIRUS GI/GII: NOT DETECTED
Plesimonas shigelloides: NOT DETECTED
Rotavirus A: NOT DETECTED
SHIGA LIKE TOXIN PRODUCING E COLI (STEC): NOT DETECTED
SHIGELLA/ENTEROINVASIVE E COLI (EIEC): NOT DETECTED
Salmonella species: NOT DETECTED
Sapovirus (I, II, IV, and V): NOT DETECTED
VIBRIO CHOLERAE: NOT DETECTED
Vibrio species: NOT DETECTED
Yersinia enterocolitica: NOT DETECTED

## 2018-03-29 LAB — C DIFFICILE QUICK SCREEN W PCR REFLEX
C DIFFICLE (CDIFF) ANTIGEN: NEGATIVE
C Diff interpretation: NOT DETECTED
C Diff toxin: NEGATIVE

## 2018-06-30 ENCOUNTER — Other Ambulatory Visit: Payer: Self-pay | Admitting: Family Medicine

## 2018-06-30 DIAGNOSIS — Z1231 Encounter for screening mammogram for malignant neoplasm of breast: Secondary | ICD-10-CM

## 2018-07-31 ENCOUNTER — Ambulatory Visit
Admission: RE | Admit: 2018-07-31 | Discharge: 2018-07-31 | Disposition: A | Discharge status: Home / Self Care | Payer: Medicare HMO | Source: Ambulatory Visit | Attending: Family Medicine | Admitting: Family Medicine

## 2018-07-31 DIAGNOSIS — Z1231 Encounter for screening mammogram for malignant neoplasm of breast: Secondary | ICD-10-CM

## 2018-07-31 IMAGING — MG DIGITAL SCREENING BILATERAL MAMMOGRAM WITH TOMO AND CAD
6 of 10 series · 6 of 30 positions shown · non-contrast
Comparison: Previous exam(s).

CLINICAL DATA: Screening.

EXAM:
DIGITAL SCREENING BILATERAL MAMMOGRAM WITH TOMO AND CAD

[L MLO synth-2D (1 of 2)]
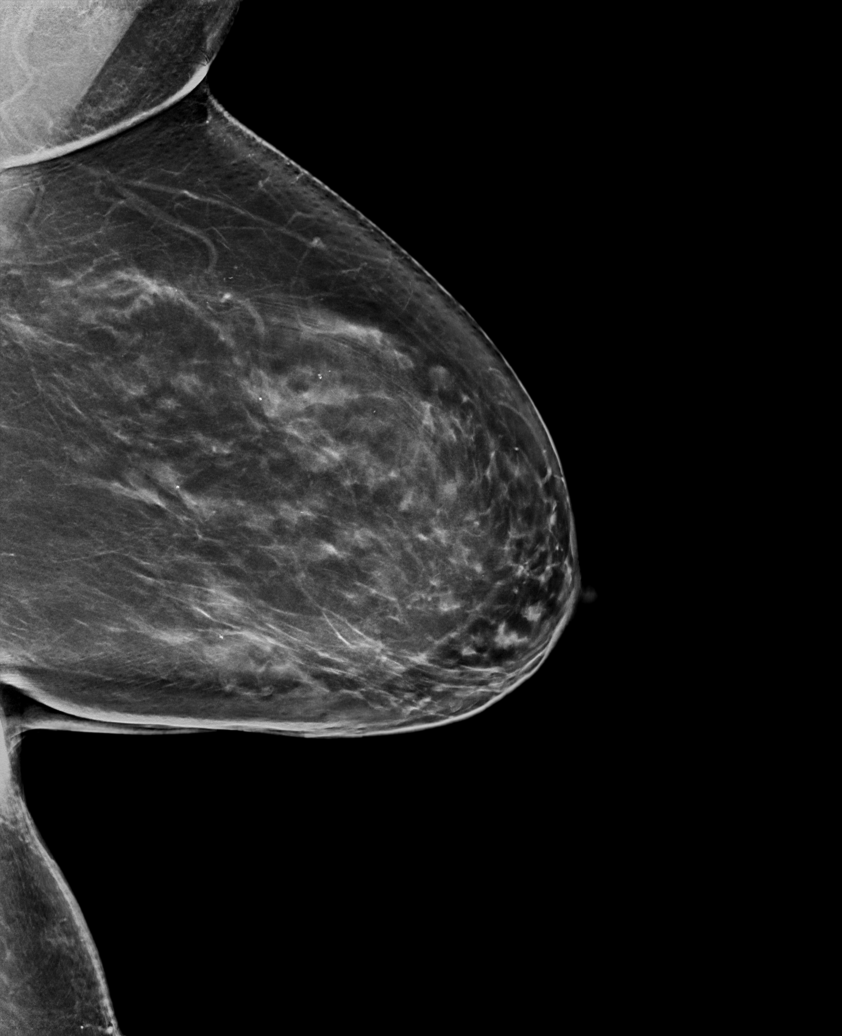

[R CC synth-2D]
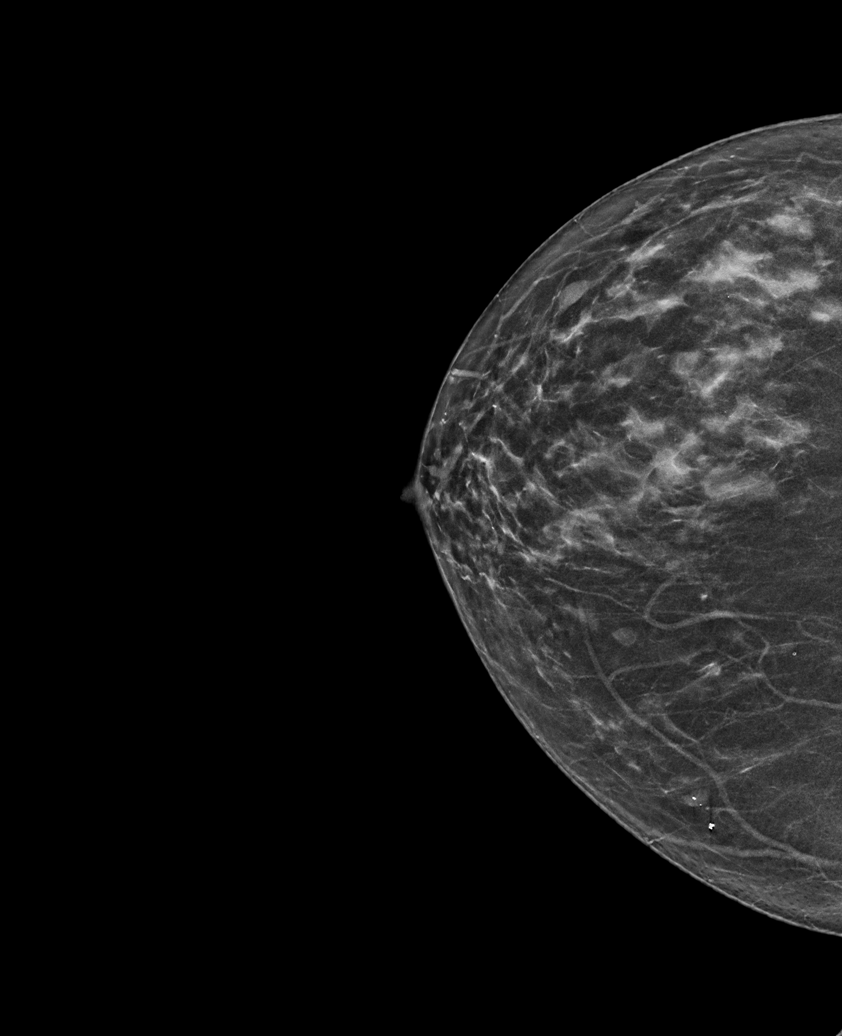

[L CC synth-2D]
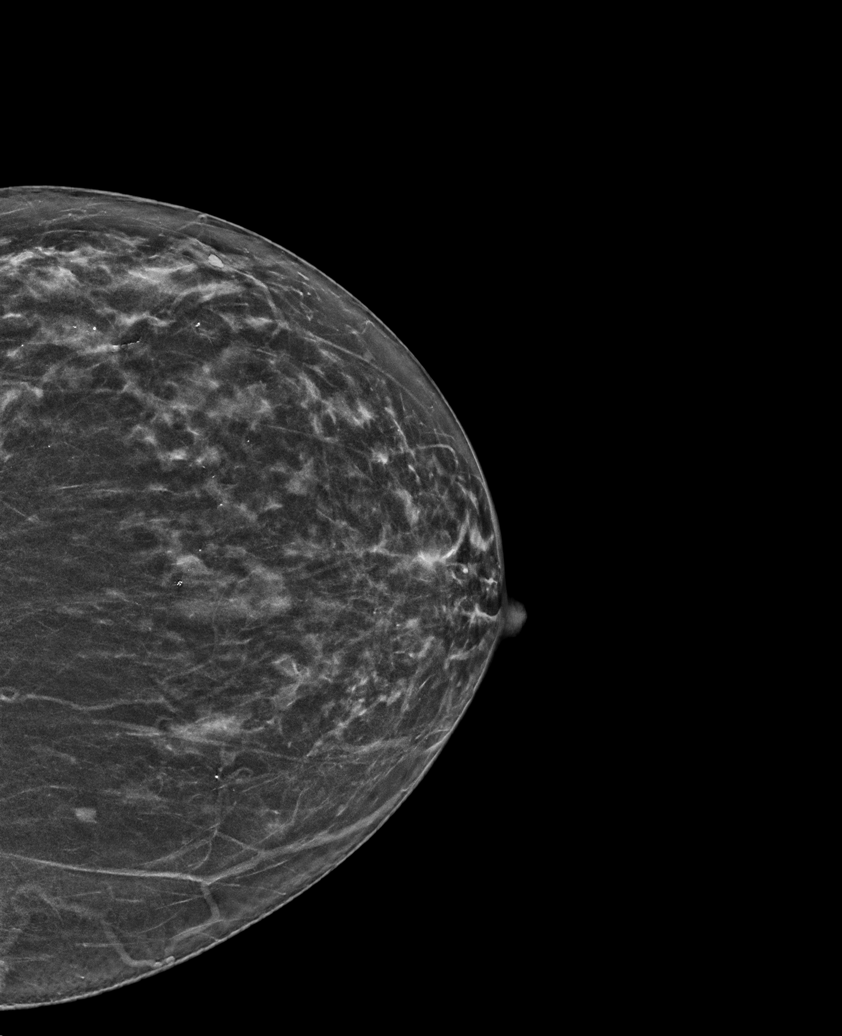

[R MLO synth-2D]
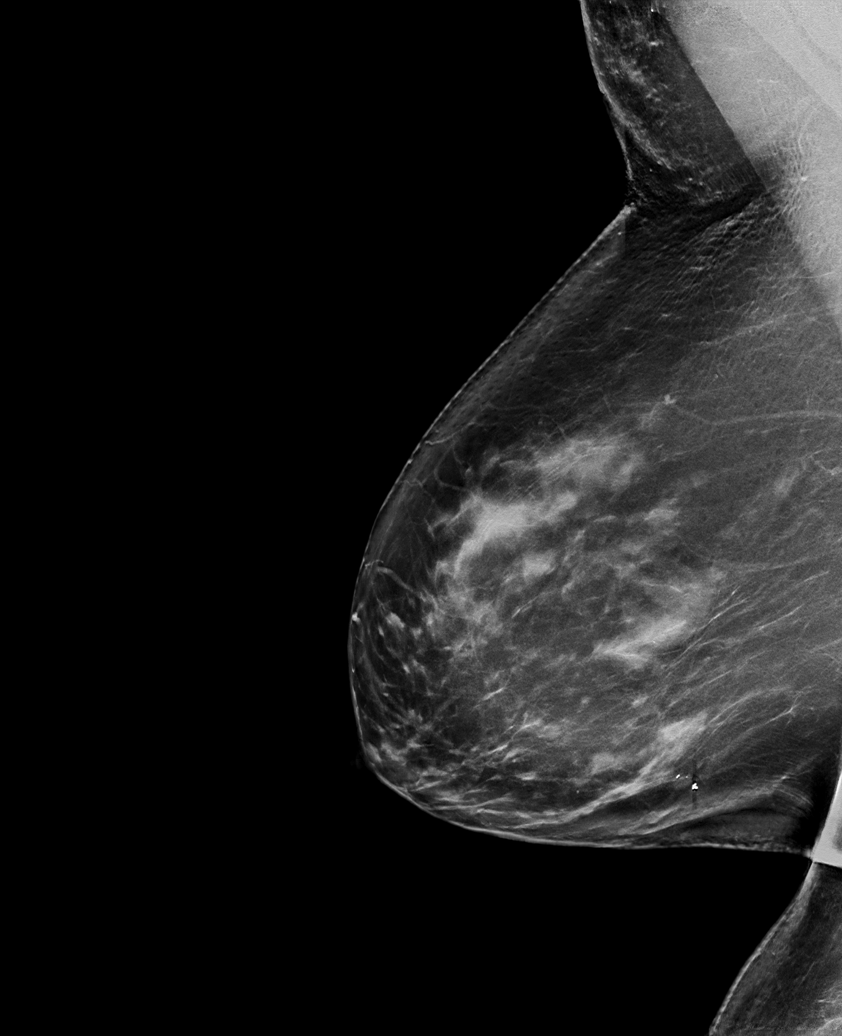

[L MLO synth-2D (2 of 2)]
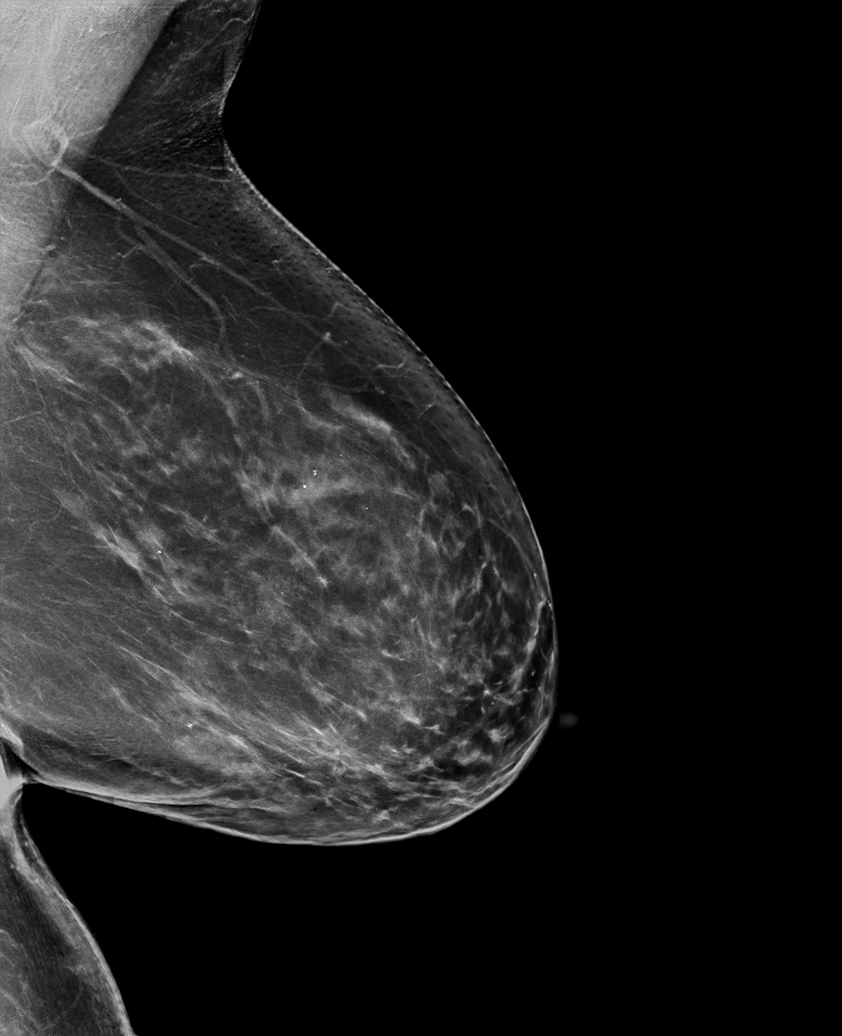

[R CC tomo · tomo slice 29/58.0]
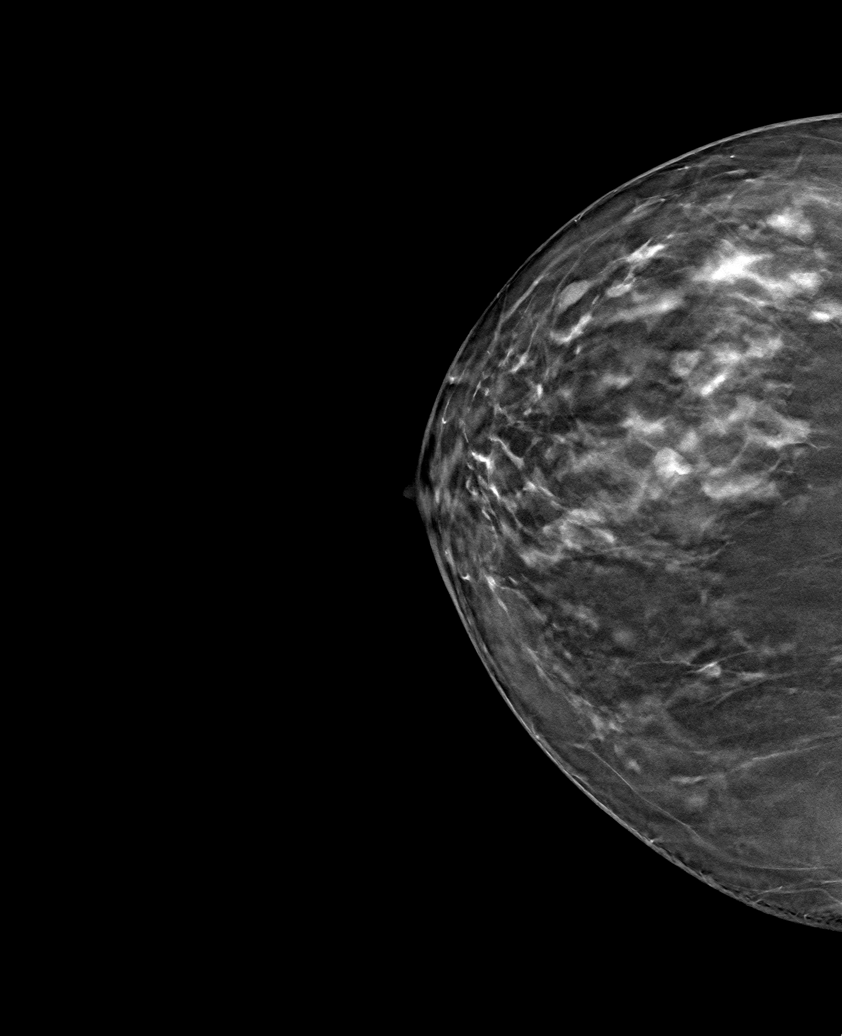

[6 of 30 positions shown; findings below may reference images not displayed]

ACR Breast Density Category c: The breast tissue is heterogeneously
dense, which may obscure small masses.
FINDINGS: There are no findings suspicious for malignancy. Images were
processed with CAD.
IMPRESSION: No mammographic evidence of malignancy. A result letter of this
screening mammogram will be mailed directly to the patient.

RECOMMENDATION:
Screening mammogram in one year. (Code:[5V])

BI-RADS CATEGORY  1: Negative.

## 2019-12-24 ENCOUNTER — Other Ambulatory Visit: Payer: Self-pay

## 2019-12-24 ENCOUNTER — Ambulatory Visit: Payer: Medicare HMO | Admitting: Urology

## 2019-12-24 VITALS — BP 161/77 | HR 65 | Ht 63.0 in | Wt 188.0 lb

## 2019-12-24 DIAGNOSIS — R319 Hematuria, unspecified: Secondary | ICD-10-CM | POA: Diagnosis not present

## 2019-12-24 DIAGNOSIS — R310 Gross hematuria: Secondary | ICD-10-CM

## 2019-12-24 NOTE — Progress Notes (Signed)
12/24/2019 2:15 PM   Dorothy Gross [birth year] [id number]  Referring provider: Maryland Pink, MD [address] [place],  [place] [address]  Chief Complaint  Patient presents with  . Hematuria    HPI: 2 weeks ago patient had painless gross hematuria and was treated with an antibiotic but it recurred.  She quit smoking many years ago.  Had a kidney stone 40 years ago.  Takes daily aspirin blood thinner.  No previous bladder surgery or infection history  She voids every hour but does some time voiding.  No nocturia and is continent.   PMH: Past Medical History:  Diagnosis Date  . A-fib ([place])   . Arthritis   . CAD (coronary artery disease)   . Cardiomyopathy ([place])   . CHF (congestive heart failure) ([place])   . Diabetes mellitus without complication ([place])   . Hyperlipidemia   . Hypertension     Surgical History: Past Surgical History:  Procedure Laterality Date  . ABDOMINAL HYSTERECTOMY    . APPENDECTOMY    . BREAST BIOPSY Right    negative 1974 negative 2015  . CHOLECYSTECTOMY N/A 12/05/2015   Procedure: LAPAROSCOPIC CHOLECYSTECTOMY WITH INTRAOPERATIVE CHOLANGIOGRAM;  Surgeon: Leonie Green, MD;  Location: [facility] ORS;  Service: General;  Laterality: N/A;  . COLONOSCOPY WITH PROPOFOL N/A 10/18/2016   Procedure: COLONOSCOPY WITH PROPOFOL;  Surgeon: Manya Silvas, MD;  Location: [facility] ENDOSCOPY;  Service: Endoscopy;  Laterality: N/A;  . COLONOSCOPY WITH PROPOFOL N/A 03/17/2018   Procedure: COLONOSCOPY WITH PROPOFOL;  Surgeon: Manya Silvas, MD;  Location: [facility] ENDOSCOPY;  Service: Endoscopy;  Laterality: N/A;  . JOINT REPLACEMENT     both knees  . TONSILLECTOMY      Home Medications:  Allergies as of 12/24/2019   No Known Allergies     Medication List       Accurate as of December 24, 2019  2:15 PM. If you have any questions, ask your nurse or doctor.        STOP taking these medications   glipiZIDE 2.5 MG 24 hr tablet Commonly  known as: GLUCOTROL XL Stopped by: Reece Packer, MD   simvastatin 20 MG tablet Commonly known as: ZOCOR Stopped by: Reece Packer, MD   traMADol 50 MG tablet Commonly known as: ULTRAM Stopped by: Reece Packer, MD     TAKE these medications   amiodarone 200 MG tablet Commonly known as: PACERONE TAKE 1 TABLET BY MOUTH EVERY DAY   aspirin EC 81 MG tablet TAKE 1 TABLET BY MOUTH EVERY DAY   CALCIUM 1200 PO Take 1,200 mg by mouth daily.   calcium carbonate 1250 (500 Ca) MG tablet Commonly known as: OS-CAL - dosed in mg of elemental calcium Take by mouth.   carvedilol 6.25 MG tablet Commonly known as: COREG TAKE 1 TABLET BY MOUTH TWICE A DAY   [employer] Zinc 50 MG tablet Generic drug: zinc gluconate Take by mouth.   cyanocobalamin 1000 MCG tablet Take by mouth.   digoxin 0.125 MG tablet Commonly known as: LANOXIN TAKE 1 TABLET BY MOUTH ONCE A DAY   DULCOLAX 100 MG capsule Generic drug: docusate sodium Take 100 mg by mouth daily.   Eliquis 5 MG Tabs tablet Generic drug: apixaban TAKE 1 TABLET (5 MG TOTAL) BY MOUTH 2 (TWO) TIMES DAILY.   EQL Natural Zinc 50 MG Tabs Take 50 mg by mouth daily.   ferrous sulfate 325 (65 FE) MG tablet Take by mouth.  FIBER SELECT GUMMIES PO Take 2 tablets by mouth daily.   Fifty50 Glucose Meter 2.0 w/Device Kit Use as directed   FLAX SEED OIL PO Take 2 capsules by mouth daily.   fluticasone 50 MCG/ACT nasal spray Commonly known as: FLONASE Place into the nose.   furosemide 40 MG tablet Commonly known as: LASIX TAKE 1 TABLET BY MOUTH EVERY DAY   Lancets Misc   losartan 50 MG tablet Commonly known as: COZAAR Take 50 mg by mouth daily. What changed: Another medication with the same name was removed. Continue taking this medication, and follow the directions you see here. Changed by: Reece Packer, MD   metFORMIN 500 MG 24 hr tablet Commonly known as: GLUCOPHAGE-XR TAKE 2 TABLETS BY MOUTH EVERY DAY  WITH DINNER   MULTIVITAMIN ADULT PO Take 1 tablet by mouth daily.   mupirocin ointment 2 % Commonly known as: BACTROBAN   OneTouch Ultra test strip Generic drug: glucose blood USE 1 EACH (1 STRIP TOTAL) 3 (THREE) TIMES DAILY   rosuvastatin 10 MG tablet Commonly known as: CRESTOR Take by mouth.   Vitamin D3 25 MCG (1000 UT) Caps Take 1,000 Units by mouth daily.       Allergies: No Known Allergies  Family History: Family History  Problem Relation Age of Onset  . Diabetes Mother   . Diabetes Sister   . Diabetes Maternal Aunt   . Breast cancer Neg Hx     Social History:  reports that she quit smoking about 12 years ago. Her smoking use included cigarettes. She has a 3.75 pack-year smoking history. She has never used smokeless tobacco. She reports that she does not drink alcohol or use drugs.  ROS:                                        Physical Exam: BP (!) 161/77   Pulse 65   Ht 5' 3" (1.6 m)   Wt 188 lb (85.3 kg)   BMI 33.30 kg/m   Constitutional:  Alert and oriented, No acute distress. HEENT:  AT, moist mucus membranes.  Trachea midline, no masses. Cardiovascular: No clubbing, cyanosis, or edema. Respiratory: Normal respiratory effort, no increased work of breathing. GI: Abdomen is soft, nontender, nondistended, no abdominal masses GU: No CVA tenderness.  No bladder tenderness Skin: No rashes, bruises or suspicious lesions. Lymph: No cervical or inguinal adenopathy. Neurologic: Grossly intact, no focal deficits, moving all 4 extremities. Psychiatric: Normal mood and affect.  Laboratory Data: Lab Results  Component Value Date   WBC 17.3 (H) 06/22/2016   HGB 12.0 06/22/2016   HCT 36.4 06/22/2016   MCV 81.0 06/22/2016   PLT 247 06/22/2016    Lab Results  Component Value Date   CREATININE 0.84 06/22/2016    No results found for: PSA  No results found for: TESTOSTERONE  No results found for: HGBA1C  Urinalysis No  results found for: COLORURINE, APPEARANCEUR, LABSPEC, [place], GLUCOSEU, HGBUR, BILIRUBINUR, KETONESUR, PROTEINUR, UROBILINOGEN, NITRITE, LEUKOCYTESUR  Pertinent Imaging: No x-ray' reviewed medical record and no recent culture or CT scan.  Urinalysis positive blood in urine sent for culture  Assessment & Plan: Patient has gross and microscopic hematuria.  Return with cystoscopy and CT scan.  Proceed accordingly  1. Hematuria, unspecified type  - Urinalysis, Complete   No follow-ups on file.  Reece Packer, MD  [place] [address]  [place], [place] [address] [phone]

## 2019-12-24 NOTE — Patient Instructions (Signed)

## 2019-12-25 LAB — URINALYSIS, COMPLETE
Bilirubin, UA: NEGATIVE
Glucose, UA: NEGATIVE
Ketones, UA: NEGATIVE
Leukocytes,UA: NEGATIVE
Nitrite, UA: NEGATIVE
Protein,UA: NEGATIVE
Specific Gravity, UA: 1.01 (ref 1.005–1.030)
Urobilinogen, Ur: 0.2 mg/dL (ref 0.2–1.0)
pH, UA: 7 (ref 5.0–7.5)

## 2019-12-25 LAB — MICROSCOPIC EXAMINATION: Bacteria, UA: NONE SEEN

## 2019-12-26 LAB — CULTURE, URINE COMPREHENSIVE

## 2019-12-28 ENCOUNTER — Ambulatory Visit: Payer: Self-pay | Admitting: Urology

## 2020-01-09 ENCOUNTER — Ambulatory Visit
Admission: RE | Admit: 2020-01-09 | Discharge: 2020-01-09 | Disposition: A | Discharge status: Home / Self Care | Payer: Medicare HMO | Source: Ambulatory Visit | Attending: Urology | Admitting: Urology

## 2020-01-09 ENCOUNTER — Telehealth: Payer: Self-pay | Admitting: *Deleted

## 2020-01-09 ENCOUNTER — Other Ambulatory Visit: Payer: Self-pay

## 2020-01-09 DIAGNOSIS — R319 Hematuria, unspecified: Secondary | ICD-10-CM | POA: Insufficient documentation

## 2020-01-09 LAB — POCT I-STAT CREATININE: Creatinine, Ser: 0.8 mg/dL (ref 0.44–1.00)

## 2020-01-09 IMAGING — CT CT ABD-PEL WO/W CM
2 of 12 series · 9 of 46 positions shown, 15 images · IV contrast (omnipaque)
Comparison: No recent comparison imaging is available aside from
plain film evaluations.

CLINICAL DATA: Microscopic hematuria and history of gross
hematuria, painless in a patient with history of renal calculi

EXAM:
CT ABDOMEN AND PELVIS WITHOUT AND WITH CONTRAST
TECHNIQUE: Multidetector CT imaging of the abdomen and pelvis was performed
following the standard protocol before and following the bolus
administration of intravenous contrast.
CONTRAST:  125mL OMNIPAQUE IOHEXOL 300 MG/ML  SOLN

[Series 2: abd without pre 5.00 · axial · non-contrast · 0.91mm/px · z∈[-1464,-1134]mm · 7 of 88 slices shown, 12 images]
[im 11/88  soft-tissue]
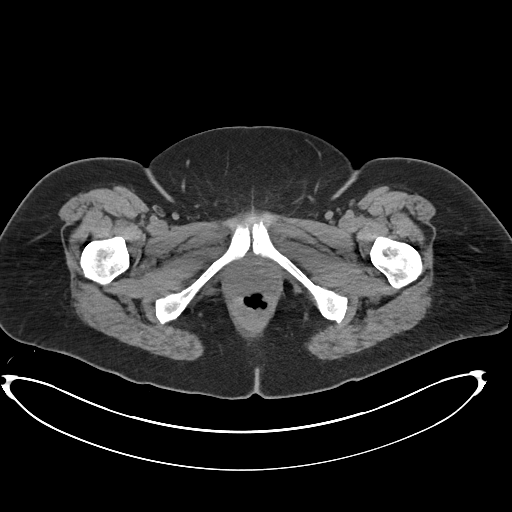
[im 11/88  bone]
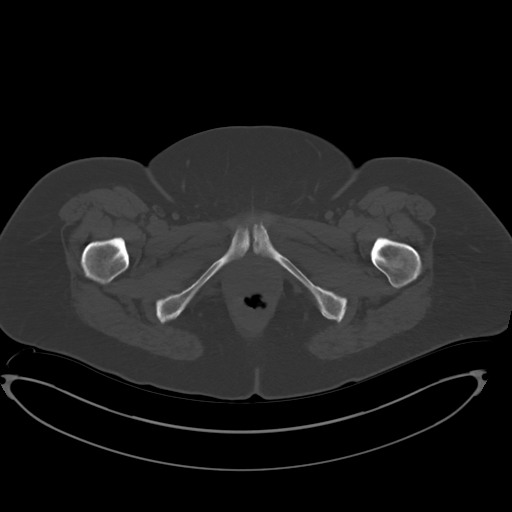
[im 22/88  soft-tissue]
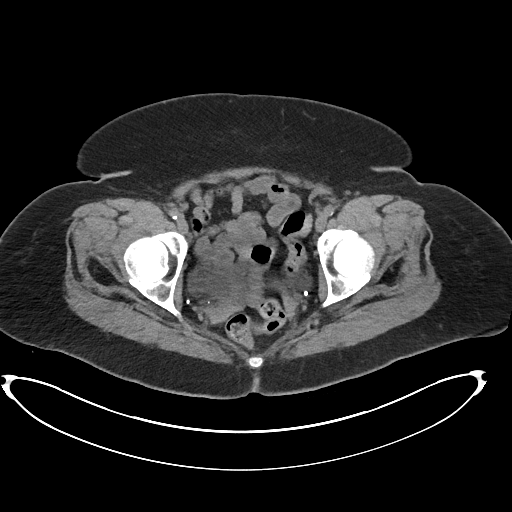
[im 33/88  soft-tissue]
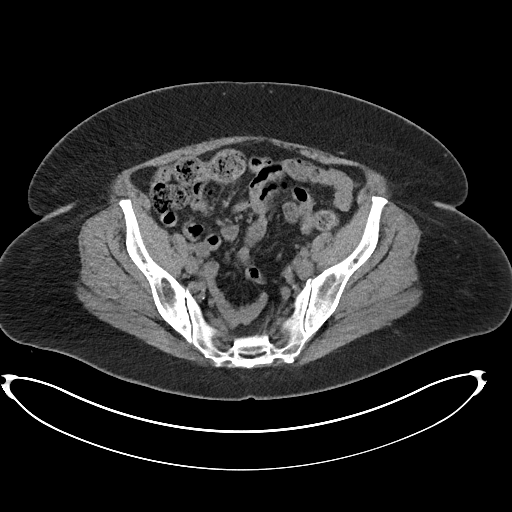
[im 44/88  soft-tissue]
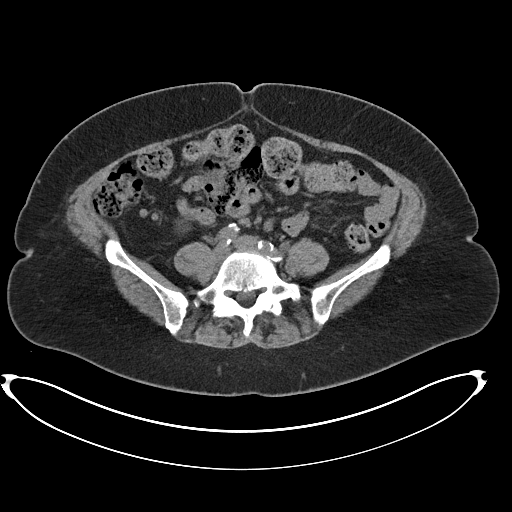
[im 44/88  lung]
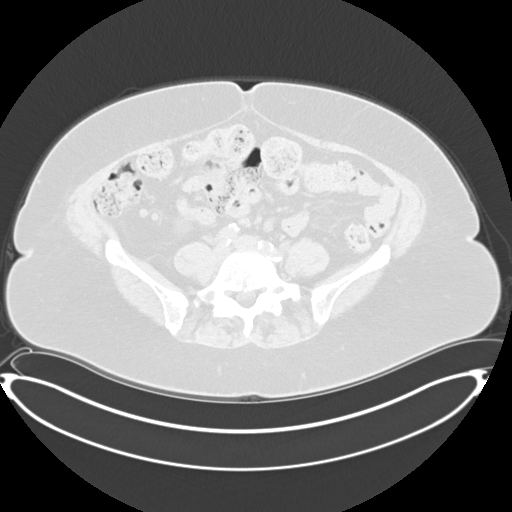
[im 55/88  soft-tissue]
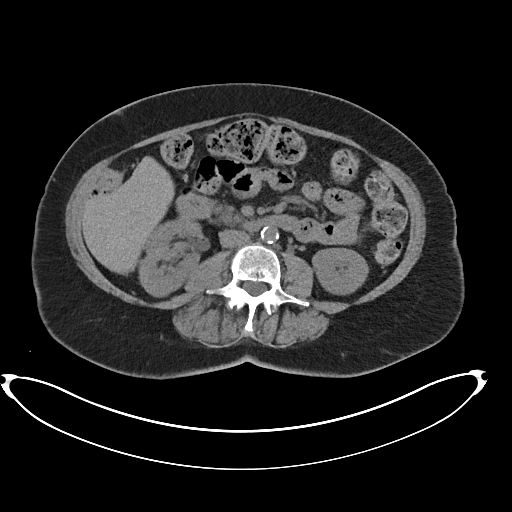
[im 55/88  lung]
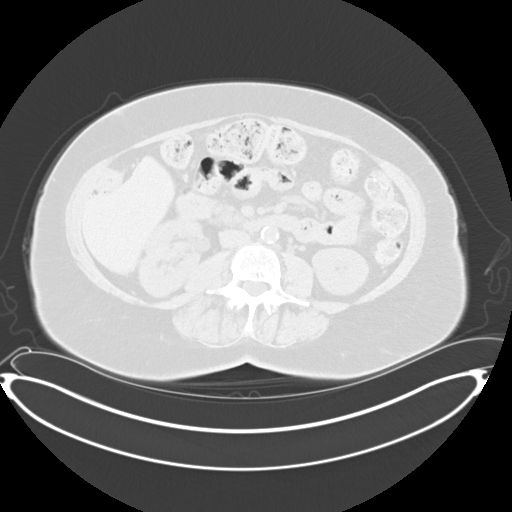
[im 66/88  soft-tissue]
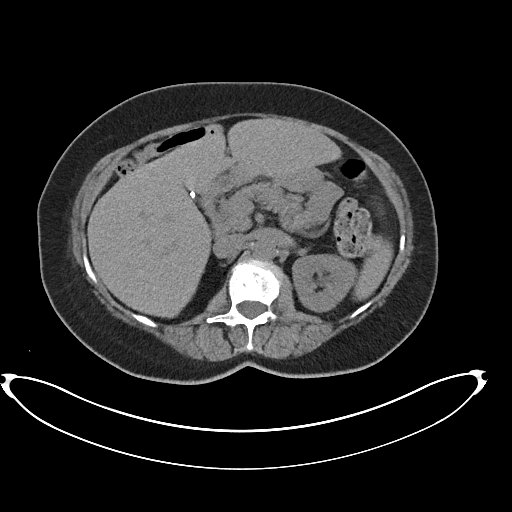
[im 66/88  lung]
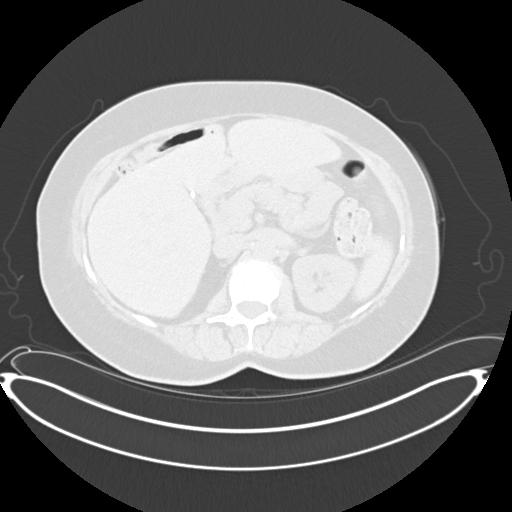
[im 77/88  soft-tissue]
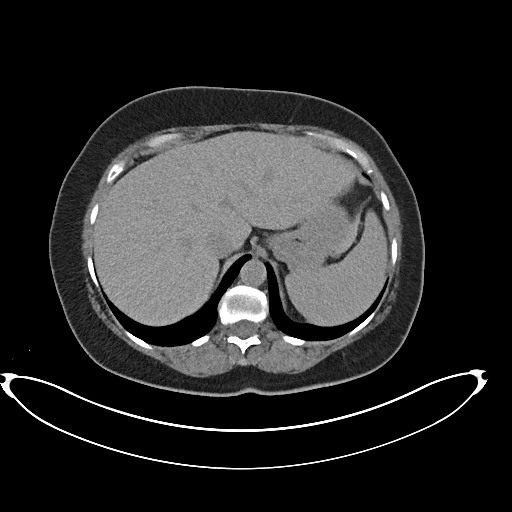
[im 77/88  lung]
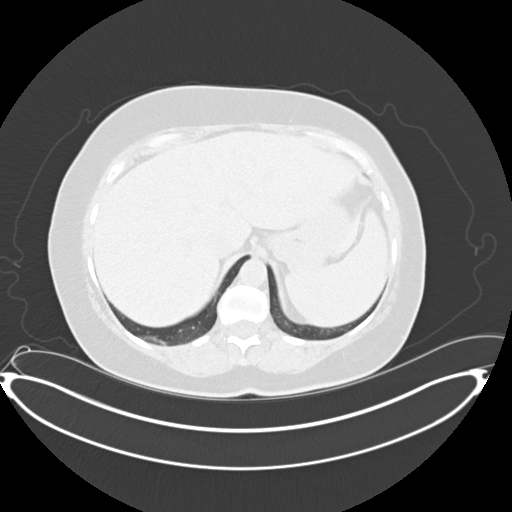

[Series 5: cor without without pre 2.00 cor · coronal · non-contrast · 0.86mm/px · 2 of 148 slices shown, 3 images]
[im 50/148  soft-tissue]
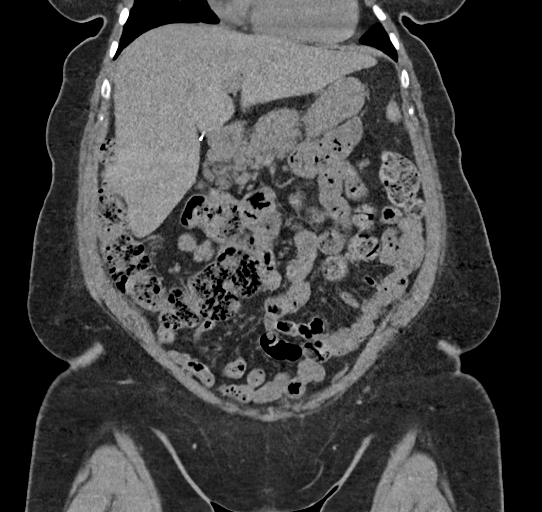
[im 50/148  bone]
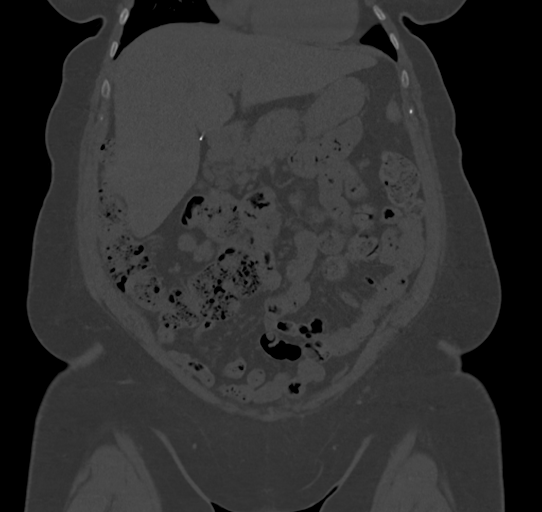
[im 99/148  soft-tissue]
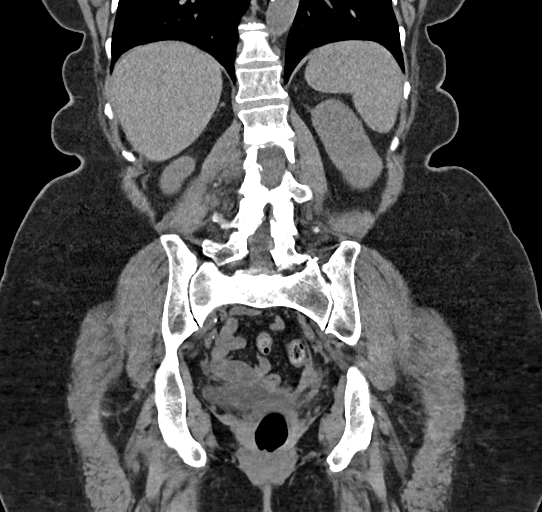

[9 of 46 positions shown; findings below may reference images not displayed]

FINDINGS: Lower chest: Subpleural reticulation in the RIGHT chest at the
periphery in the RIGHT lower lobe, similar but less pronounced
changes in the LEFT lung base. No pleural effusion. No dense
consolidation.

Hepatobiliary: Lobular hepatic contours. Post cholecystectomy. No
sign of biliary ductal dilation. No suspicious focal hepatic lesion.
Portal vein is patent.

Pancreas: Pancreas is normal.

Spleen: Spleen is normal size without focal lesion.

Adrenals/Urinary Tract: Mild adrenal nodularity bilaterally dominant
area on the LEFT measuring 11 x 13 mm with baseline density of 40
Hounsfield units. Post-contrast density of nearly 95 Hounsfield
units.

Striated nephrogram on the RIGHT. Signs of nephrolithiasis with 8 x
5 mm calculus in the lower pole the RIGHT kidney. No ureteral
calculus.

Nonobstructing intrarenal calculi on the LEFT with 5 mm interpolar
calculus and a similar size lower pole calculus. Mild LEFT ureteral
distension with long segment urothelial thickening and no sign of
urinary tract calculus. Urinary bladder is unremarkable.

On post-contrast images there is long segment urothelial thickening
of the distal LEFT ureter just proximal to the UVJ. The transition
point occurs just above this segment.

Similar but shorter segment involvement seen on the RIGHT. Limited
distension of the distal ureters bilaterally. Gradual transition at
the sacral promontory. Question of urothelial thickening on the LEFT
on delayed phase imaging which is circumferential affecting the
distal third of the LEFT ureter along the LEFT pelvic sidewall.

Stomach/Bowel: No acute gastrointestinal process. Small bowel is of
normal caliber. Ileocecal distortion, with respect to the mesentery
with medial position of the cecum could reflect cecal bascule but is
not complicated by sign of obstruction. Appendix is surgically
absent.

Vascular/Lymphatic: Calcified and noncalcified atheromatous plaque
of the abdominal aorta. No evidence of adenopathy. No pelvic
lymphadenopathy.

Reproductive: Post hysterectomy.

Other: No ascites. No abdominal wall hernia aside from small fat
containing umbilical hernia.

Musculoskeletal: No acute bone finding. No destructive bone process.
IMPRESSION: 1. Striated nephrogram on the RIGHT, correlate with any clinical or
laboratory evidence of pyelonephritis.
2. Mild LEFT ureteral and RIGHT ureteral distension, greatest on the
LEFT with segmental urothelial enhancement also greatest on the LEFT
could be due to recent infection. Recently passed calculus on the
LEFT is also considered given the more profound involvement on this
side. Continued follow-up may be helpful with ureteroscopic
assessment as clinically warranted.
3. Nonobstructive bilateral nephrolithiasis.
4. Mild adrenal nodularity bilaterally dominant area on the LEFT
measuring 11 x 13 mm with baseline density of 40 Hounsfield units.
Venous phase density of 95 Hounsfield units and delayed phase
density while not a strict adrenal delay of 58 Hounsfield units.
This suggests this represents an adrenal adenoma. Given the elevated
density of the venous phase near 100 Hounsfield units would suggest
correlation with laboratory values to exclude the possibility of
pheochromocytoma or functioning adenoma. Follow-up adrenal protocol
may be helpful for strict characterization.
5. Subpleural reticulation in the RIGHT greater than LEFT chest.
Findings could be related to early interstitial disease. Correlate
with any history of respiratory symptoms with high-resolution chest
CT as warranted for follow-up.
6. Aortic atherosclerosis.

These results will be called to the ordering clinician or
representative by the Radiologist Assistant, and communication
documented in the PACS or [REDACTED].

Aortic Atherosclerosis ([5S]-[5S]).

## 2020-01-09 MED ORDER — IOHEXOL 300 MG/ML  SOLN
125.0000 mL | Freq: Once | INTRAMUSCULAR | Status: AC | PRN
  Administered 2020-01-09: 08:00:00 125 mL via INTRAVENOUS

## 2020-01-09 NOTE — Telephone Encounter (Addendum)
Patient informed, scheduled lab appointment for Urine culture. Has follow up scheduled 01/14/20. Denies any UTI symptoms or flank pain, ongoing blood which has improved.   ----- Message from Bjorn Loser, MD sent at 01/09/2020  4:06 PM EDT ----- Jacki Cones me a favorThis patient's CT scan is suggesting urinary tract infection or even kidney infectionPlease call her and make sure clinically she is not having pyelonephritis or UTI symptomsMake sure she has a follow-up appointment with me I am sure she doesIf it is not too inconvenient have her come in and leave another urine cultureIf she leaves a urine culture you can give her Cipro 500 twice daily x7 days as long as she is not allergic to it or otherwise for example Bactrim DS 1 tablet twice a day for 1 week; Let her know if she gets flank pain or fever we need to knowPlease keep me postedScott   ----- Message ----- From: Royanne Foots, [place]: 01/09/2020   2:44 PM EDT To: Bjorn Loser, MD   ----- Message ----- From: Interface, Rad Results In Sent: 01/09/2020   2:01 PM EDT To: Rowe Robert Clinical

## 2020-01-10 ENCOUNTER — Other Ambulatory Visit: Payer: Medicare HMO

## 2020-01-10 DIAGNOSIS — R319 Hematuria, unspecified: Secondary | ICD-10-CM

## 2020-01-13 LAB — CULTURE, URINE COMPREHENSIVE

## 2020-01-14 ENCOUNTER — Ambulatory Visit: Payer: Medicare HMO | Admitting: Urology

## 2020-01-14 ENCOUNTER — Encounter: Payer: Self-pay | Admitting: Urology

## 2020-01-14 ENCOUNTER — Other Ambulatory Visit: Payer: Self-pay

## 2020-01-14 VITALS — BP 177/73 | HR 69

## 2020-01-14 DIAGNOSIS — R319 Hematuria, unspecified: Secondary | ICD-10-CM | POA: Diagnosis not present

## 2020-01-14 LAB — MICROSCOPIC EXAMINATION: Bacteria, UA: NONE SEEN

## 2020-01-14 LAB — URINALYSIS, COMPLETE
Bilirubin, UA: NEGATIVE
Glucose, UA: NEGATIVE
Ketones, UA: NEGATIVE
Leukocytes,UA: NEGATIVE
Nitrite, UA: NEGATIVE
Protein,UA: NEGATIVE
Specific Gravity, UA: 1.02 (ref 1.005–1.030)
Urobilinogen, Ur: 0.2 mg/dL (ref 0.2–1.0)
pH, UA: 6.5 (ref 5.0–7.5)

## 2020-01-14 NOTE — Progress Notes (Signed)
01/14/2020 8:42 AM   Dorothy Gross 04-07-1951 [id number]  Referring provider: [place] Pink, MD [address] [place],  [place] [address]  Chief Complaint  Patient presents with  . Cysto    HPI: 2 weeks ago patient had painless gross hematuria and was treated with an antibiotic but it recurred.  She quit smoking many years ago.  Had a kidney stone 40 years ago.  Takes daily aspirin blood thinner.  No previous bladder surgery or infection history  She voids every hour but does some time voiding.  No nocturia and is continent.  Today Frequency stable.  Urine culture negative.  CT scan demonstrated a striated nephrogram on the right with an 8 x 5 mm nonobstructing stone.  She had a nonobstructing stone on the left 5 mm.  Patient had ureteral distention on both sides and on the left a long segment of urothelial thickening with similar findings on the right.  There was also on urothelial enhancement greater on the left.  The findings were in keeping with a recent urinary tract infection or passed stone.  Patient also may have a adrenal adenoma with recommendations regarding possible functioning adenoma or pheochromocytoma  We had called the patient and clinically she was not infected and an antibiotic was not called in with a negative urine culture  Patient had passed blood for 4 more days after my last visit.  Urine today showed microscopic hematuria no bacteria but urine sent for culture  Cystoscopy: Patient underwent flexible cystoscopy utilizing sterile technique.  Bladder mucosa and trigone were normal.  No cystitis.  No carcinoma.  No blood from ureteral orifices.  PMH: Past Medical History:  Diagnosis Date  . A-fib ([place])   . Arthritis   . CAD (coronary artery disease)   . Cardiomyopathy ([place])   . CHF (congestive heart failure) ([place])   . Diabetes mellitus without complication ([place])   . Hyperlipidemia   . Hypertension     Surgical  History: Past Surgical History:  Procedure Laterality Date  . ABDOMINAL HYSTERECTOMY    . APPENDECTOMY    . BREAST BIOPSY Right    negative 1974 negative 2015  . CHOLECYSTECTOMY N/A 12/05/2015   Procedure: LAPAROSCOPIC CHOLECYSTECTOMY WITH INTRAOPERATIVE CHOLANGIOGRAM;  Surgeon: Leonie Green, MD;  Location: [facility] ORS;  Service: General;  Laterality: N/A;  . COLONOSCOPY WITH PROPOFOL N/A 10/18/2016   Procedure: COLONOSCOPY WITH PROPOFOL;  Surgeon: Manya Silvas, MD;  Location: [facility] ENDOSCOPY;  Service: Endoscopy;  Laterality: N/A;  . COLONOSCOPY WITH PROPOFOL N/A 03/17/2018   Procedure: COLONOSCOPY WITH PROPOFOL;  Surgeon: Manya Silvas, MD;  Location: [facility] ENDOSCOPY;  Service: Endoscopy;  Laterality: N/A;  . JOINT REPLACEMENT     both knees  . TONSILLECTOMY      Home Medications:  Allergies as of 01/14/2020   No Known Allergies     Medication List       Accurate as of Jan 14, 2020  8:42 AM. If you have any questions, ask your nurse or doctor.        amiodarone 200 MG tablet Commonly known as: PACERONE TAKE 1 TABLET BY MOUTH EVERY DAY   aspirin EC 81 MG tablet TAKE 1 TABLET BY MOUTH EVERY DAY   CALCIUM 1200 PO Take 1,200 mg by mouth daily.   calcium carbonate 1250 (500 Ca) MG tablet Commonly known as: OS-CAL - dosed in mg of elemental calcium Take by mouth.   carvedilol 6.25 MG tablet Commonly known  as: COREG TAKE 1 TABLET BY MOUTH TWICE A DAY   [employer] Zinc 50 MG tablet Generic drug: zinc gluconate Take by mouth.   cyanocobalamin 1000 MCG tablet Take by mouth.   digoxin 0.125 MG tablet Commonly known as: LANOXIN TAKE 1 TABLET BY MOUTH ONCE A DAY   DULCOLAX 100 MG capsule Generic drug: docusate sodium Take 100 mg by mouth daily.   Eliquis 5 MG Tabs tablet Generic drug: apixaban TAKE 1 TABLET (5 MG TOTAL) BY MOUTH 2 (TWO) TIMES DAILY.   EQL Natural Zinc 50 MG Tabs Take 50 mg by mouth daily.   ferrous sulfate 325 (65 FE) MG tablet Take by  mouth.   FIBER SELECT GUMMIES PO Take 2 tablets by mouth daily.   Fifty50 Glucose Meter 2.0 w/Device Kit Use as directed   FLAX SEED OIL PO Take 2 capsules by mouth daily.   fluticasone 50 MCG/ACT nasal spray Commonly known as: FLONASE Place into the nose.   furosemide 40 MG tablet Commonly known as: LASIX TAKE 1 TABLET BY MOUTH EVERY DAY   Lancets Misc   losartan 50 MG tablet Commonly known as: COZAAR Take 50 mg by mouth daily.   metFORMIN 500 MG 24 hr tablet Commonly known as: GLUCOPHAGE-XR TAKE 2 TABLETS BY MOUTH EVERY DAY WITH DINNER   MULTIVITAMIN ADULT PO Take 1 tablet by mouth daily.   mupirocin ointment 2 % Commonly known as: BACTROBAN   OneTouch Ultra test strip Generic drug: glucose blood USE 1 EACH (1 STRIP TOTAL) 3 (THREE) TIMES DAILY   rosuvastatin 10 MG tablet Commonly known as: CRESTOR Take by mouth.   Vitamin D3 25 MCG (1000 UT) Caps Take 1,000 Units by mouth daily.       Allergies: No Known Allergies  Family History: Family History  Problem Relation Age of Onset  . Diabetes Mother   . Diabetes Sister   . Diabetes Maternal Aunt   . Breast cancer Neg Hx     Social History:  reports that she quit smoking about 12 years ago. Her smoking use included cigarettes. She has a 3.75 pack-year smoking history. She has never used smokeless tobacco. She reports that she does not drink alcohol or use drugs.  ROS:                                        Physical Exam: BP (!) 177/73   Pulse 69   Constitutional:  Alert and oriented, No acute distress.  Laboratory Data: Lab Results  Component Value Date   WBC 17.3 (H) 06/22/2016   HGB 12.0 06/22/2016   HCT 36.4 06/22/2016   MCV 81.0 06/22/2016   PLT 247 06/22/2016    Lab Results  Component Value Date   CREATININE 0.80 01/09/2020    No results found for: PSA  No results found for: TESTOSTERONE  No results found for: HGBA1C  Urinalysis    Component Value  Date/Time   APPEARANCEUR Hazy (A) 12/24/2019 1410   GLUCOSEU Negative 12/24/2019 1410   BILIRUBINUR Negative 12/24/2019 1410   PROTEINUR Negative 12/24/2019 1410   NITRITE Negative 12/24/2019 1410   LEUKOCYTESUR Negative 12/24/2019 1410    Pertinent Imaging:   Assessment & Plan: Picture was drawn.  Findings are in keeping with likely distant upper tract urinary tract infection.  Without a positive culture I do not give more antibiotics.  She may need a repeat CT  scan in a few months but I would like her to see one of my partners since bilateral retrogrades and ureteroscopy would be very reasonable to consider.  I will leave it up to Dr. Diamantina Providence to whether he orders anterior adrenal adenoma labs.  1. Hematuria, unspecified type  - Urinalysis, Complete   No follow-ups on file.  Reece Packer, MD  [facility] [address] [place], [place] [address] [phone]

## 2020-01-16 LAB — CULTURE, URINE COMPREHENSIVE

## 2020-02-06 ENCOUNTER — Other Ambulatory Visit: Payer: Self-pay | Admitting: Radiology

## 2020-02-06 ENCOUNTER — Encounter: Payer: Self-pay | Admitting: Urology

## 2020-02-06 ENCOUNTER — Other Ambulatory Visit: Payer: Self-pay

## 2020-02-06 ENCOUNTER — Ambulatory Visit: Payer: Medicare HMO | Admitting: Urology

## 2020-02-06 DIAGNOSIS — R319 Hematuria, unspecified: Secondary | ICD-10-CM

## 2020-02-06 DIAGNOSIS — D3500 Benign neoplasm of unspecified adrenal gland: Secondary | ICD-10-CM | POA: Diagnosis not present

## 2020-02-06 DIAGNOSIS — N200 Calculus of kidney: Secondary | ICD-10-CM

## 2020-02-06 NOTE — Progress Notes (Signed)
   02/06/2020 9:47 AM   Dorothy Gross 08-07-1951 [id number]  Reason for visit: Follow up hematuria, abnormal CT findings  HPI: I saw [name] in urology clinic today for evaluation of hematuria and abnormal CT findings.  She was previously followed by Dr. Vikki Ports for gross hematuria work-up last month in our clinic.  She had hematuria associated with a Proteus UTI in March 2021, however has had persistent gross hematuria over the last few months despite multiple negative urine cultures.  She denies any flank pain or urinary symptoms.  Urinalysis today shows no evidence of infection with persistent significant microscopic hematuria with greater than 30 RBCs.  She underwent a hematuria work-up with Dr. Matilde Sprang and CT urogram showed bilateral lower pole kidney stones, as well as some dilation of both ureters, left greater than right as well as some possible left urothelial enhancement distally.  There were no frank tumors or masses.  Cystoscopy was normal.  She was referred to me for further discussion of options.  With her abnormal CT findings and persistent gross hematuria, I recommended further evaluation with cystoscopy, bilateral diagnostic ureteroscopy, possible biopsy, treatment of her stones simultaneously with laser lithotripsy, and stent placement. We specifically discussed the risks ureteroscopy including bleeding, infection/sepsis, stent related symptoms including flank pain/urgency/frequency/incontinence/dysuria, ureteral injury, inability to access stone, or need for staged or additional procedures.  Schedule bilateral diagnostic ureteroscopy, possible biopsy, laser lithotripsy of stones, stent placement Will refer to endocrinology for further work-up of adrenal adenoma  Billey Co, MD  [place] [address] [place], [place] [address] [phone]

## 2020-02-06 NOTE — Patient Instructions (Signed)
Ureteroscopy Ureteroscopy is a procedure to check for and treat problems inside part of the urinary tract. In this procedure, a thin, tube-shaped instrument with a light at the end (ureteroscope) is used to look at the inside of the kidneys and the ureters, which are the tubes that carry urine from the kidneys to the bladder. The ureteroscope is inserted into one or both of the ureters. You may need this procedure if you have frequent urinary tract infections (UTIs), blood in your urine, or a stone in one of your ureters. A ureteroscopy can be done to find the cause of urine blockage in a ureter and to evaluate other abnormalities inside the ureters or kidneys. If stones are found, they can be removed during the procedure. Polyps, abnormal tissue, and some types of tumors can also be removed or treated. The ureteroscope may also have a tool to remove tissue to be checked for disease under a microscope (biopsy). Tell a health care provider about:  Any allergies you have.  All medicines you are taking, including vitamins, herbs, eye drops, creams, and over-the-counter medicines.  Any problems you or family members have had with anesthetic medicines.  Any blood disorders you have.  Any surgeries you have had.  Any medical conditions you have.  Whether you are pregnant or may be pregnant. What are the risks? Generally, this is a safe procedure. However, problems may occur, including:  Bleeding.  Infection.  Allergic reactions to medicines.  Scarring that narrows the ureter (stricture).  Creating a hole in the ureter (perforation). What happens before the procedure? Staying hydrated Follow instructions from your health care provider about hydration, which may include:  Up to 2 hours before the procedure - you may continue to drink clear liquids, such as water, clear fruit juice, black coffee, and plain tea. Eating and drinking restrictions Follow instructions from your health care  provider about eating and drinking, which may include:  8 hours before the procedure - stop eating heavy meals or foods such as meat, fried foods, or fatty foods.  6 hours before the procedure - stop eating light meals or foods, such as toast or cereal.  6 hours before the procedure - stop drinking milk or drinks that contain milk.  2 hours before the procedure - stop drinking clear liquids. Medicines  Ask your health care provider about: ? Changing or stopping your regular medicines. This is especially important if you are taking diabetes medicines or blood thinners. ? Taking medicines such as aspirin and ibuprofen. These medicines can thin your blood. Do not take these medicines before your procedure if your health care provider instructs you not to.  You may be given antibiotic medicine to help prevent infection. General instructions  You may have a urine sample taken to check for infection.  Plan to have someone take you home from the hospital or clinic. What happens during the procedure?   To reduce your risk of infection: ? Your health care team will wash or sanitize their hands. ? Your skin will be washed with soap.  An IV tube will be inserted into one of your veins.  You will be given one of the following: ? A medicine to help you relax (sedative). ? A medicine to make you fall asleep (general anesthetic). ? A medicine that is injected into your spine to numb the area below and slightly above the injection site (spinal anesthetic).  To lower your risk of infection, you may be given an antibiotic medicine  by an injection or through the IV tube.  The opening from which you urinate (urethra) will be cleaned with a germ-killing solution.  The ureteroscope will be passed through your urethra into your bladder.  A salt-water solution will flow through the ureteroscope to fill your bladder. This will help the health care provider see the openings of your ureters more  clearly.  Then, the ureteroscope will be passed into your ureter. ? If a growth is found, a piece of it may be removed so it can be examined under a microscope (biopsy). ? If a stone is found, it may be removed through the ureteroscope, or the stone may be broken up using a laser, shock waves, or electrical energy. ? In some cases, if the ureter is too small, a tube may be inserted that keeps the ureter open (ureteral stent). The stent may be left in place for 1 or 2 weeks to keep the ureter open, and then the ureteroscopy procedure will be performed.  The scope will be removed, and your bladder will be emptied. The procedure may vary among health care providers and hospitals. What happens after the procedure?  Your blood pressure, heart rate, breathing rate, and blood oxygen level will be monitored until the medicines you were given have worn off.  You may be asked to urinate.  Donot drive for 24 hours if you were given a sedative. This information is not intended to replace advice given to you by your health care provider. Make sure you discuss any questions you have with your health care provider. Document Revised: 07/29/2017 Document Reviewed: 05/28/2016 Elsevier Patient Education  [employer].   Ureteral Stent Implantation  Ureteral stent implantation is a procedure to insert (implant) a flexible, soft, plastic tube (stent) into a ureter. Ureters are the tube-like parts of the body that drain urine from the kidneys. The stent supports the ureter while it heals and helps to drain urine. You may have a ureteral stent implanted after having a procedure to remove a blockage from the ureter (ureterolysis or pyeloplasty). You may also have a stent implanted to open the flow of urine when you have a blockage caused by a kidney stone, tumor, blood clot, or infection. You have two ureters, one on each side of the body. The ureters connect the kidneys to the organ that holds urine until it  passes out of the body (bladder). The stent is placed so that one end is in the kidney, and one end is in the bladder. The stent is usually taken out after your ureter has healed. Depending on your condition, you may have a stent for just a few weeks, or you may have a long-term stent that will need to be replaced every few months. Tell a health care provider about:  Any allergies you have.  All medicines you are taking, including vitamins, herbs, eye drops, creams, and over-the-counter medicines.  Any problems you or family members have had with anesthetic medicines.  Any blood disorders you have.  Any surgeries you have had.  Any medical conditions you have.  Whether you are pregnant or may be pregnant. What are the risks? Generally, this is a safe procedure. However, problems may occur, including:  Infection.  Bleeding.  Allergic reactions to medicines.  Damage to other structures or organs. Tearing (perforation) of the ureter is possible.  Movement of the stent away from where it is placed during surgery (migration). What happens before the procedure? Medicines Ask your health  care provider about:  Changing or stopping your regular medicines. This is especially important if you are taking diabetes medicines or blood thinners.  Taking medicines such as aspirin and ibuprofen. These medicines can thin your blood. Do not take these medicines unless your health care provider tells you to take them.  Taking over-the-counter medicines, vitamins, herbs, and supplements. Eating and drinking Follow instructions from your health care provider about eating and drinking, which may include:  8 hours before the procedure - stop eating heavy meals or foods, such as meat, fried foods, or fatty foods.  6 hours before the procedure - stop eating light meals or foods, such as toast or cereal.  6 hours before the procedure - stop drinking milk or drinks that contain milk.  2 hours before  the procedure - stop drinking clear liquids. Staying hydrated Follow instructions from your health care provider about hydration, which may include:  Up to 2 hours before the procedure - you may continue to drink clear liquids, such as water, clear fruit juice, black coffee, and plain tea. General instructions  Do not drink alcohol.  Do not use any products that contain nicotine or tobacco for at least 4 weeks before the procedure. These products include cigarettes, e-cigarettes, and chewing tobacco. If you need help quitting, ask your health care provider.  You may have an exam or testing, such as imaging or blood tests.  Ask your health care provider what steps will be taken to help prevent infection. These may include: ? Removing hair at the surgery site. ? Washing skin with a germ-killing soap. ? Taking antibiotic medicine.  Plan to have someone take you home from the hospital or clinic.  If you will be going home right after the procedure, plan to have someone with you for 24 hours. What happens during the procedure?  An IV will be inserted into one of your veins.  You may be given a medicine to help you relax (sedative).  You may be given a medicine to make you fall asleep (general anesthetic).  A thin, tube-shaped instrument with a light and tiny camera at the end (cystoscope) will be inserted into your urethra. The urethra is the tube that drains urine from the bladder out of the body. In men, the urethra opens at the end of the penis. In women, the urethra opens in front of the vaginal opening.  The cystoscope will be passed into your bladder.  A thin wire (guide wire) will be passed through your bladder and into your ureter. This is used to guide the stent into your ureter.  The stent will be inserted into your ureter.  The guide wire and the cystoscope will be removed.  A flexible tube (catheter) may be inserted through your urethra so that one end is in your  bladder. This helps to drain urine from your bladder. The procedure may vary among hospitals and health care providers. What happens after the procedure?  Your blood pressure, heart rate, breathing rate, and blood oxygen level will be monitored until you leave the hospital or clinic.  You may continue to receive medicine and fluids through an IV.  You may have some soreness or pain in your abdomen and urethra. Medicines will be available to help you.  You will be encouraged to get up and walk around as soon as you can.  You may have a catheter draining your urine.  You will have some blood in your urine.  Do not drive  for 24 hours if you were given a sedative during your procedure. Summary  Ureteral stent implantation is a procedure to insert a flexible, soft, plastic tube (stent) into a ureter.  You may have a stent implanted to support the ureter while it heals after a procedure or to open the flow of urine if there is a blockage.  Follow instructions from your health care provider about taking medicines and about eating and drinking before the procedure.  Depending on your condition, you may have a stent for just a few weeks, or you may have a long-term stent that will need to be replaced every few months. This information is not intended to replace advice given to you by your health care provider. Make sure you discuss any questions you have with your health care provider. Document Revised: 05/23/2018 Document Reviewed: 05/24/2018 Elsevier Patient Education  2020 [employer].

## 2020-02-07 LAB — URINALYSIS, COMPLETE
Bilirubin, UA: NEGATIVE
Glucose, UA: NEGATIVE
Ketones, UA: NEGATIVE
Leukocytes,UA: NEGATIVE
Nitrite, UA: NEGATIVE
Protein,UA: NEGATIVE
Specific Gravity, UA: 1.015 (ref 1.005–1.030)
Urobilinogen, Ur: 0.2 mg/dL (ref 0.2–1.0)
pH, UA: 5.5 (ref 5.0–7.5)

## 2020-02-07 LAB — MICROSCOPIC EXAMINATION
Bacteria, UA: NONE SEEN
RBC: >30 /hpf — AB (ref 0–2)

## 2020-02-15 ENCOUNTER — Other Ambulatory Visit: Payer: Self-pay | Admitting: Family Medicine

## 2020-02-15 DIAGNOSIS — Z1231 Encounter for screening mammogram for malignant neoplasm of breast: Secondary | ICD-10-CM

## 2020-02-25 ENCOUNTER — Other Ambulatory Visit
Admission: RE | Admit: 2020-02-25 | Discharge: 2020-02-25 | Disposition: A | Discharge status: Home / Self Care | Payer: Medicare HMO | Source: Ambulatory Visit | Attending: Internal Medicine | Admitting: Internal Medicine

## 2020-02-25 DIAGNOSIS — R0602 Shortness of breath: Secondary | ICD-10-CM | POA: Diagnosis present

## 2020-02-25 LAB — DIGOXIN LEVEL: Digoxin Level: 0.6 ng/mL — ABNORMAL LOW (ref 0.8–2.0)

## 2020-02-26 ENCOUNTER — Other Ambulatory Visit: Payer: Self-pay

## 2020-02-26 ENCOUNTER — Ambulatory Visit
Admission: RE | Admit: 2020-02-26 | Discharge: 2020-02-26 | Disposition: A | Discharge status: Home / Self Care | Payer: Medicare HMO | Source: Ambulatory Visit | Attending: Family Medicine | Admitting: Family Medicine

## 2020-02-26 DIAGNOSIS — Z1231 Encounter for screening mammogram for malignant neoplasm of breast: Secondary | ICD-10-CM | POA: Diagnosis not present

## 2020-02-26 IMAGING — MG DIGITAL SCREENING BILAT W/ TOMO W/ CAD
8 series · 8 of 24 positions shown · non-contrast
Comparison: Previous exam(s).

CLINICAL DATA: Screening.

EXAM:
DIGITAL SCREENING BILATERAL MAMMOGRAM WITH TOMO AND CAD

[L CC synth-2D]
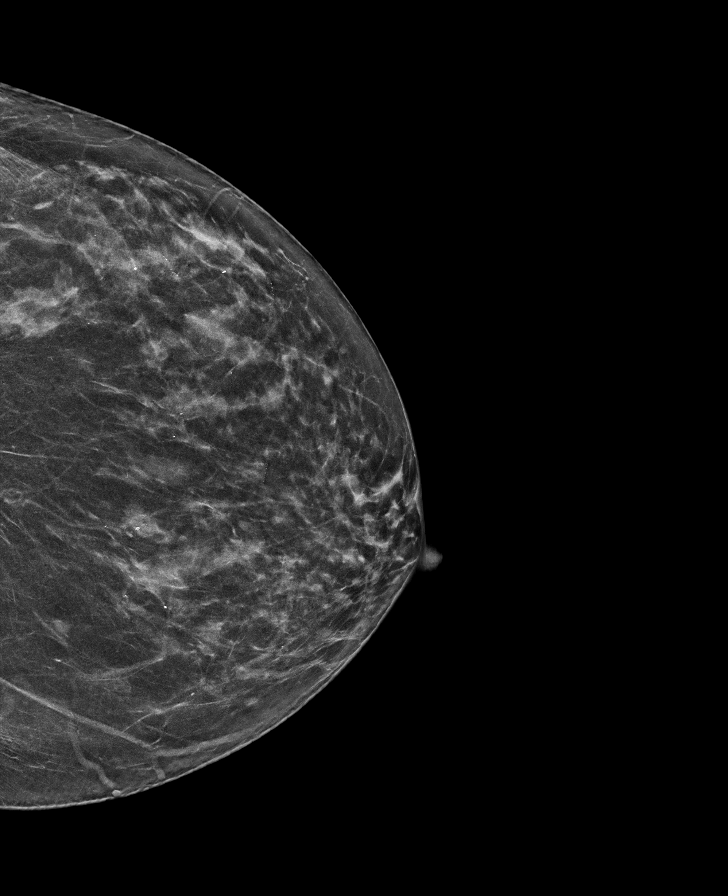

[R MLO synth-2D]
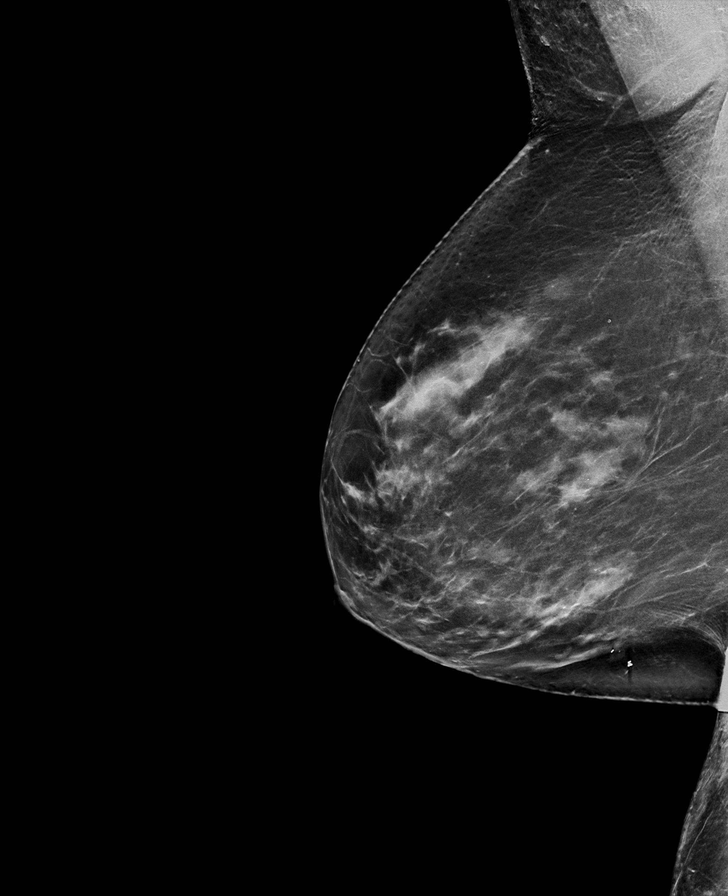

[R CC synth-2D]
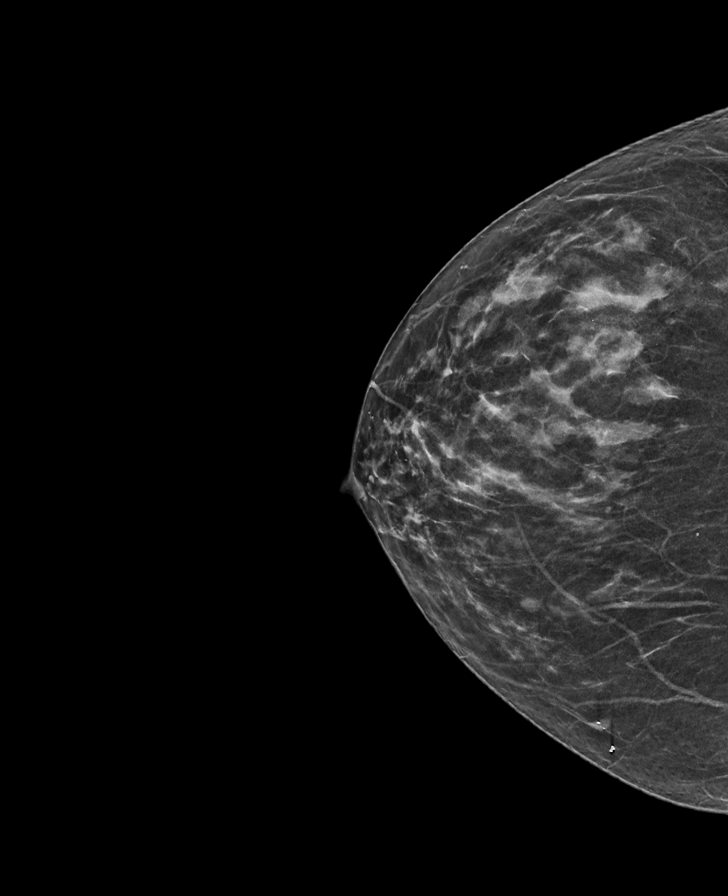

[L MLO synth-2D]
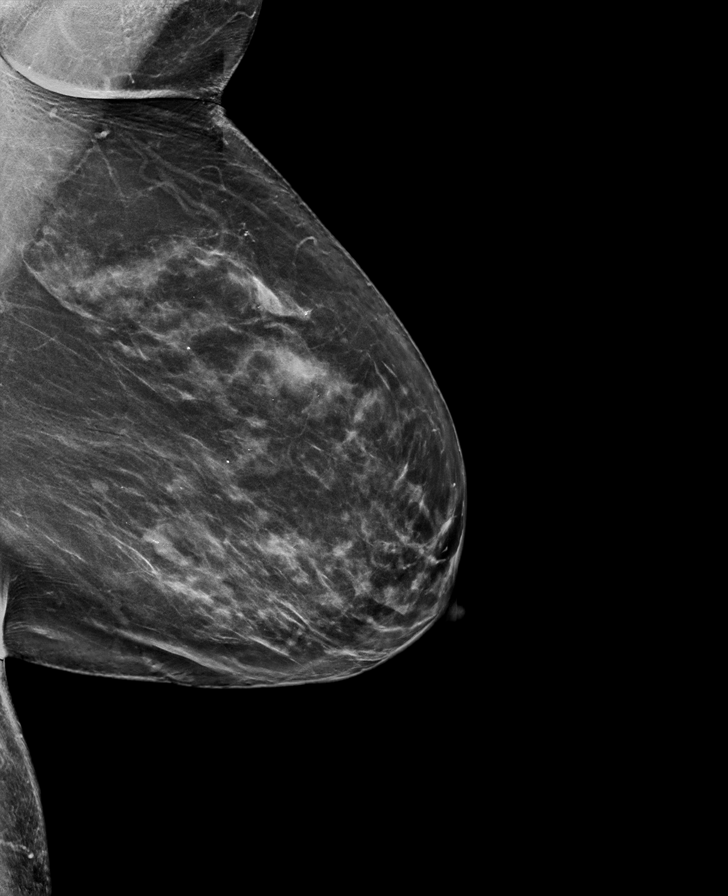

[L MLO tomo · tomo slice 39/77.0]
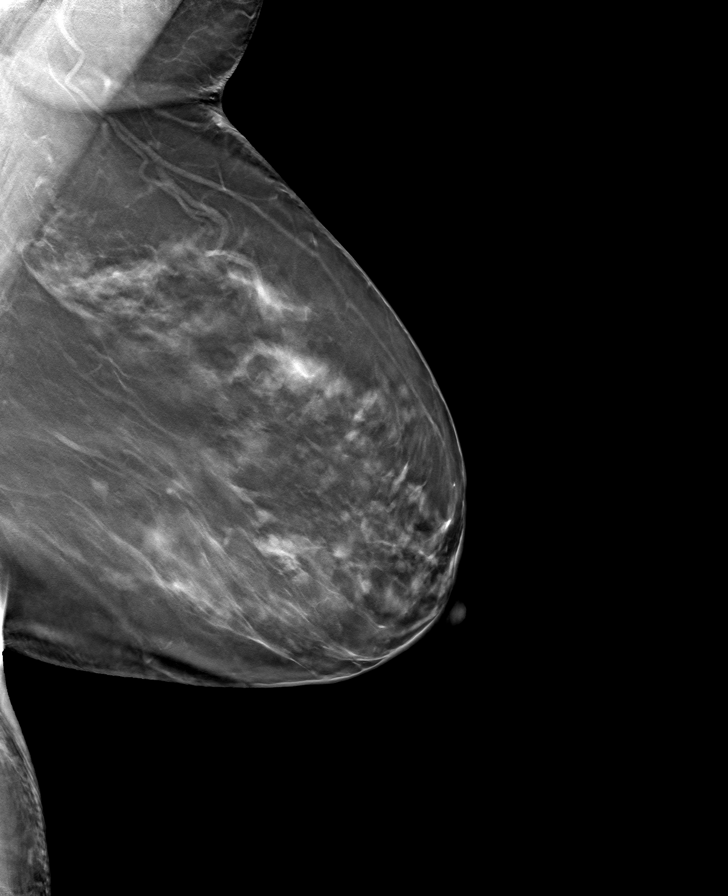

[L CC tomo · tomo slice 31/61.0]
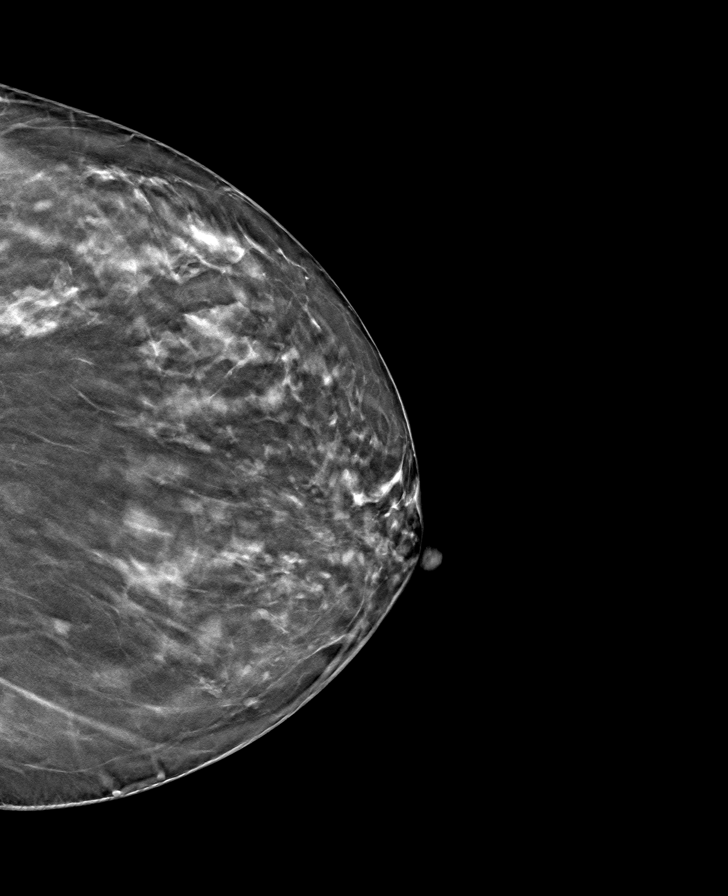

[R MLO tomo · tomo slice 39/77.0]
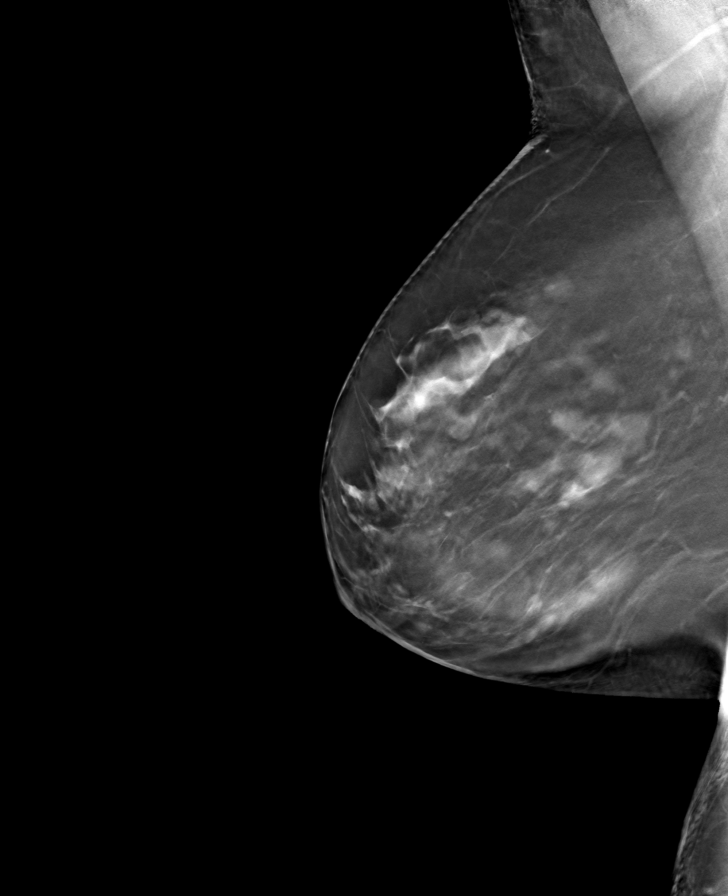

[R CC tomo · tomo slice 29/58.0]
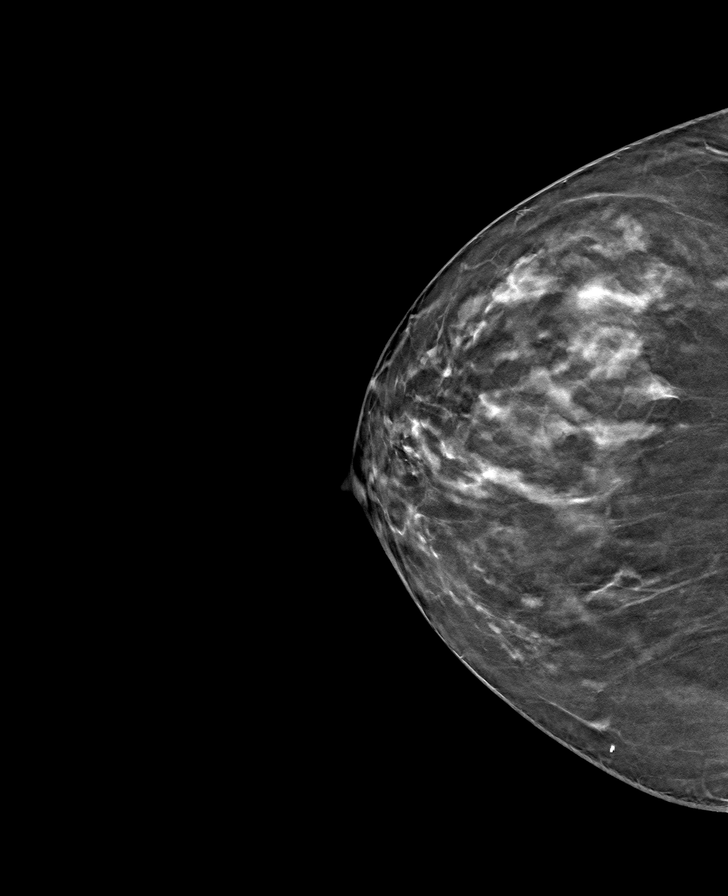

[8 of 24 positions shown; findings below may reference images not displayed]

ACR Breast Density Category c: The breast tissue is heterogeneously
dense, which may obscure small masses.
FINDINGS: There are no findings suspicious for malignancy. Images were
processed with CAD.
IMPRESSION: No mammographic evidence of malignancy. A result letter of this
screening mammogram will be mailed directly to the patient.

RECOMMENDATION:
Screening mammogram in one year. (Code:[5V])

BI-RADS CATEGORY  1: Negative.

## 2020-02-27 ENCOUNTER — Other Ambulatory Visit: Payer: Self-pay

## 2020-02-27 ENCOUNTER — Encounter
Admission: RE | Admit: 2020-02-27 | Discharge: 2020-02-27 | Disposition: A | Discharge status: Home / Self Care | Payer: Medicare HMO | Source: Ambulatory Visit | Attending: Urology | Admitting: Urology

## 2020-02-27 DIAGNOSIS — N200 Calculus of kidney: Secondary | ICD-10-CM

## 2020-02-27 HISTORY — DX: Personal history of urinary calculi: Z87.442

## 2020-02-27 NOTE — Patient Instructions (Addendum)
Your procedure is scheduled on: Friday 03/07/20.  Report to DAY SURGERY DEPARTMENT LOCATED ON 2ND FLOOR MEDICAL MALL ENTRANCE. To find out your arrival time please call [phone] between 1PM - 3PM on Wednesday 03/06/20.   Remember: Instructions that are not followed completely may result in serious medical risk, up to and including death, or upon the discretion of your surgeon and anesthesiologist your surgery may need to be rescheduled.      _X__ 1. Do not eat food after midnight the night before your procedure.                 No gum chewing or hard candies. You may drink SUGAR FREE clear liquids up to 2 hours                 before you are scheduled to arrive for your surgery- DO NOT drink clear                 liquids within 2 hours of the start of your surgery.                    __X__2.  On the morning of surgery brush your teeth with toothpaste and water, you may rinse your mouth with mouthwash if you wish.  Do not swallow any toothpaste or mouthwash.       __X__ 3.  No Alcohol for 24 hours before or after surgery.    __X__ 4.  Do Not Smoke or use e-cigarettes For 24 Hours Prior to Your Surgery.                 Do not use any chewable tobacco products for at least 6 hours prior to                 surgery.   __X__5.  Notify your doctor if there is any change in your medical condition      (cold, fever, infections).      Do not wear jewelry, make-up, hairpins, clips or nail polish. Do not wear lotions, powders, or perfumes.  Do not shave 48 hours prior to surgery. Men may shave face and neck. Do not bring valuables to the hospital.    [facility] is not responsible for any belongings or valuables.  Contacts, dentures/partials or body piercings may not be worn into surgery. Bring a case for your contacts, glasses or hearing aids, a denture cup will be supplied.     Patients discharged the day of surgery will not be allowed to drive home.   __X__ Take these medicines  the morning of surgery with A SIP OF WATER:     1. amiodarone (PACERONE)  2. carvedilol (COREG)  3. digoxin (LANOXIN)     __X__ Stop metformin/Janumet/Farxiga 2 days prior to surgery      __X__ Stop Blood Thinners: Eliquis & Aspirin. You reported that you had been given instructions on when to stop these medications.    __X__ Stop Anti-inflammatories 7 days before surgery such as Advil, Ibuprofen, Motrin, BC or Goodies Powder, Naprosyn, Naproxen, Aleve, Aspirin, Meloxicam. May take Tylenol if needed for pain or discomfort.

## 2020-02-28 ENCOUNTER — Encounter: Payer: Self-pay | Admitting: Urology

## 2020-02-28 ENCOUNTER — Other Ambulatory Visit (INDEPENDENT_AMBULATORY_CARE_PROVIDER_SITE_OTHER): Payer: Medicare HMO

## 2020-02-28 DIAGNOSIS — N200 Calculus of kidney: Secondary | ICD-10-CM

## 2020-02-28 LAB — URINALYSIS, COMPLETE
Bilirubin, UA: NEGATIVE
Glucose, UA: NEGATIVE
Ketones, UA: NEGATIVE
Leukocytes,UA: NEGATIVE
Nitrite, UA: NEGATIVE
Protein,UA: NEGATIVE
Specific Gravity, UA: 1.02 (ref 1.005–1.030)
Urobilinogen, Ur: 0.2 mg/dL (ref 0.2–1.0)
pH, UA: 7.5 (ref 5.0–7.5)

## 2020-02-28 LAB — MICROSCOPIC EXAMINATION: Bacteria, UA: NONE SEEN

## 2020-03-03 LAB — CULTURE, URINE COMPREHENSIVE

## 2020-03-05 ENCOUNTER — Encounter
Admission: RE | Admit: 2020-03-05 | Discharge: 2020-03-05 | Disposition: A | Discharge status: Home / Self Care | Payer: Medicare HMO | Source: Ambulatory Visit | Attending: Urology | Admitting: Urology

## 2020-03-05 ENCOUNTER — Other Ambulatory Visit: Payer: Self-pay

## 2020-03-05 DIAGNOSIS — I10 Essential (primary) hypertension: Secondary | ICD-10-CM

## 2020-03-05 DIAGNOSIS — Z20822 Contact with and (suspected) exposure to covid-19: Secondary | ICD-10-CM | POA: Insufficient documentation

## 2020-03-05 DIAGNOSIS — Z01810 Encounter for preprocedural cardiovascular examination: Secondary | ICD-10-CM

## 2020-03-05 DIAGNOSIS — Z01812 Encounter for preprocedural laboratory examination: Secondary | ICD-10-CM | POA: Diagnosis present

## 2020-03-05 LAB — CBC
HCT: 42.3 % (ref 36.0–46.0)
Hemoglobin: 13.6 g/dL (ref 12.0–15.0)
MCH: 27.9 pg (ref 26.0–34.0)
MCHC: 32.2 g/dL (ref 30.0–36.0)
MCV: 86.7 fL (ref 80.0–100.0)
Platelets: 298 10*3/uL (ref 150–400)
RBC: 4.88 MIL/uL (ref 3.87–5.11)
RDW: 15.6 % — ABNORMAL HIGH (ref 11.5–15.5)
WBC: 8.8 10*3/uL (ref 4.0–10.5)
nRBC: 0 % (ref 0.0–0.2)

## 2020-03-05 LAB — SARS CORONAVIRUS 2 (TAT 6-24 HRS): SARS Coronavirus 2: NEGATIVE

## 2020-03-07 ENCOUNTER — Telehealth: Payer: Self-pay | Admitting: Urology

## 2020-03-07 ENCOUNTER — Other Ambulatory Visit: Payer: Self-pay

## 2020-03-07 ENCOUNTER — Encounter: Payer: Self-pay | Admitting: Urology

## 2020-03-07 ENCOUNTER — Ambulatory Visit
Admission: RE | Admit: 2020-03-07 | Discharge: 2020-03-07 | Disposition: A | Discharge status: Home / Self Care | Payer: Medicare HMO | Attending: Urology | Admitting: Urology

## 2020-03-07 ENCOUNTER — Ambulatory Visit: Payer: Medicare HMO | Admitting: Certified Registered"

## 2020-03-07 ENCOUNTER — Encounter
Admission: RE | Disposition: A | Discharge status: Home / Self Care | Payer: Self-pay | Source: Home / Self Care | Attending: Urology

## 2020-03-07 ENCOUNTER — Ambulatory Visit: Payer: Medicare HMO

## 2020-03-07 DIAGNOSIS — I2510 Atherosclerotic heart disease of native coronary artery without angina pectoris: Secondary | ICD-10-CM | POA: Insufficient documentation

## 2020-03-07 DIAGNOSIS — R319 Hematuria, unspecified: Secondary | ICD-10-CM

## 2020-03-07 DIAGNOSIS — M1990 Unspecified osteoarthritis, unspecified site: Secondary | ICD-10-CM | POA: Diagnosis not present

## 2020-03-07 DIAGNOSIS — Z87891 Personal history of nicotine dependence: Secondary | ICD-10-CM | POA: Insufficient documentation

## 2020-03-07 DIAGNOSIS — I509 Heart failure, unspecified: Secondary | ICD-10-CM | POA: Diagnosis not present

## 2020-03-07 DIAGNOSIS — I429 Cardiomyopathy, unspecified: Secondary | ICD-10-CM | POA: Insufficient documentation

## 2020-03-07 DIAGNOSIS — I110 Hypertensive heart disease with heart failure: Secondary | ICD-10-CM | POA: Diagnosis not present

## 2020-03-07 DIAGNOSIS — N200 Calculus of kidney: Secondary | ICD-10-CM | POA: Diagnosis present

## 2020-03-07 DIAGNOSIS — R310 Gross hematuria: Secondary | ICD-10-CM | POA: Diagnosis not present

## 2020-03-07 DIAGNOSIS — E119 Type 2 diabetes mellitus without complications: Secondary | ICD-10-CM | POA: Diagnosis not present

## 2020-03-07 HISTORY — PX: CYSTOSCOPY W/ RETROGRADES: SHX1426

## 2020-03-07 HISTORY — PX: CYSTOSCOPY/URETEROSCOPY/HOLMIUM LASER/STENT PLACEMENT: SHX6546

## 2020-03-07 LAB — GLUCOSE, CAPILLARY
Glucose-Capillary: 109 mg/dL — ABNORMAL HIGH (ref 70–99)
Glucose-Capillary: 111 mg/dL — ABNORMAL HIGH (ref 70–99)

## 2020-03-07 SURGERY — CYSTOSCOPY/URETEROSCOPY/HOLMIUM LASER/STENT PLACEMENT
Anesthesia: General | Site: Ureter | Laterality: Bilateral

## 2020-03-07 MED ORDER — ACETAMINOPHEN 10 MG/ML IV SOLN
INTRAVENOUS | Status: DC | PRN
  Administered 2020-03-07: 1000 mg via INTRAVENOUS

## 2020-03-07 MED ORDER — FENTANYL CITRATE (PF) 100 MCG/2ML IJ SOLN
INTRAMUSCULAR | Status: DC | PRN
  Administered 2020-03-07: 25 ug via INTRAVENOUS
  Administered 2020-03-07: 75 ug via INTRAVENOUS

## 2020-03-07 MED ORDER — OXYBUTYNIN CHLORIDE ER 10 MG PO TB24
10.0000 mg | ORAL_TABLET | Freq: Every day | ORAL | 0 refills | Status: DC | PRN
Start: 2020-03-07 — End: 2020-05-21

## 2020-03-07 MED ORDER — ORAL CARE MOUTH RINSE: 15.0000 mL | Freq: Once | OROMUCOSAL | Status: AC

## 2020-03-07 MED ORDER — CEFAZOLIN SODIUM-DEXTROSE 2-4 GM/100ML-% IV SOLN
2.0000 g | INTRAVENOUS | Status: AC
  Administered 2020-03-07: 2 g via INTRAVENOUS

## 2020-03-07 MED ORDER — PROPOFOL 10 MG/ML IV BOLUS
INTRAVENOUS | Status: AC
  Filled 2020-03-07: qty 20

## 2020-03-07 MED ORDER — LIDOCAINE HCL (CARDIAC) PF 100 MG/5ML IV SOSY
PREFILLED_SYRINGE | INTRAVENOUS | Status: DC | PRN
  Administered 2020-03-07: 80 mg via INTRAVENOUS

## 2020-03-07 MED ORDER — HYDROCODONE-ACETAMINOPHEN 5-325 MG PO TABS: 1.0000 | ORAL_TABLET | ORAL | 0 refills | Status: AC | PRN

## 2020-03-07 MED ORDER — CHLORHEXIDINE GLUCONATE 0.12 % MT SOLN: 15.0000 mL | Freq: Once | OROMUCOSAL | Status: AC

## 2020-03-07 MED ORDER — EPHEDRINE 5 MG/ML INJ
INTRAVENOUS | Status: AC
  Filled 2020-03-07: qty 10

## 2020-03-07 MED ORDER — IOHEXOL 180 MG/ML  SOLN
INTRAMUSCULAR | Status: DC | PRN
  Administered 2020-03-07: 20 mL

## 2020-03-07 MED ORDER — SUGAMMADEX SODIUM 200 MG/2ML IV SOLN
INTRAVENOUS | Status: DC | PRN
  Administered 2020-03-07: 178 mg via INTRAVENOUS

## 2020-03-07 MED ORDER — PROPOFOL 10 MG/ML IV BOLUS
INTRAVENOUS | Status: DC | PRN
  Administered 2020-03-07: 150 mg via INTRAVENOUS

## 2020-03-07 MED ORDER — ONDANSETRON HCL 4 MG/2ML IJ SOLN
INTRAMUSCULAR | Status: AC
  Filled 2020-03-07: qty 2

## 2020-03-07 MED ORDER — FENTANYL CITRATE (PF) 100 MCG/2ML IJ SOLN
INTRAMUSCULAR | Status: AC
  Filled 2020-03-07: qty 2

## 2020-03-07 MED ORDER — EPHEDRINE SULFATE 50 MG/ML IJ SOLN
INTRAMUSCULAR | Status: DC | PRN
  Administered 2020-03-07 (×2): 10 mg via INTRAVENOUS

## 2020-03-07 MED ORDER — SODIUM CHLORIDE 0.9 % IV SOLN: INTRAVENOUS | Status: DC

## 2020-03-07 MED ORDER — CHLORHEXIDINE GLUCONATE 0.12 % MT SOLN
OROMUCOSAL | Status: AC
  Administered 2020-03-07: 15 mL via OROMUCOSAL
  Filled 2020-03-07: qty 15

## 2020-03-07 MED ORDER — ACETAMINOPHEN 10 MG/ML IV SOLN
INTRAVENOUS | Status: AC
  Filled 2020-03-07: qty 100

## 2020-03-07 MED ORDER — LIDOCAINE HCL (PF) 2 % IJ SOLN
INTRAMUSCULAR | Status: AC
  Filled 2020-03-07: qty 5

## 2020-03-07 MED ORDER — KETOROLAC TROMETHAMINE 15 MG/ML IJ SOLN
INTRAMUSCULAR | Status: DC | PRN
Start: 2020-03-07 — End: 2020-03-07
  Administered 2020-03-07: 15 mg via INTRAVENOUS

## 2020-03-07 MED ORDER — ROCURONIUM BROMIDE 10 MG/ML (PF) SYRINGE
PREFILLED_SYRINGE | INTRAVENOUS | Status: AC
  Filled 2020-03-07: qty 10

## 2020-03-07 MED ORDER — TAMSULOSIN HCL 0.4 MG PO CAPS
0.4000 mg | ORAL_CAPSULE | Freq: Every day | ORAL | 0 refills | Status: DC
Start: 2020-03-07 — End: 2020-05-21

## 2020-03-07 MED ORDER — FENTANYL CITRATE (PF) 100 MCG/2ML IJ SOLN: 25.0000 ug | INTRAMUSCULAR | Status: DC | PRN

## 2020-03-07 MED ORDER — ONDANSETRON HCL 4 MG/2ML IJ SOLN: 4.0000 mg | Freq: Once | INTRAMUSCULAR | Status: DC | PRN

## 2020-03-07 MED ORDER — FAMOTIDINE 20 MG PO TABS: 20.0000 mg | ORAL_TABLET | Freq: Once | ORAL | Status: AC

## 2020-03-07 MED ORDER — PHENYLEPHRINE HCL (PRESSORS) 10 MG/ML IV SOLN
INTRAVENOUS | Status: DC | PRN
  Administered 2020-03-07: 100 ug via INTRAVENOUS

## 2020-03-07 MED ORDER — ROCURONIUM BROMIDE 100 MG/10ML IV SOLN
INTRAVENOUS | Status: DC | PRN
  Administered 2020-03-07: 40 mg via INTRAVENOUS

## 2020-03-07 MED ORDER — CEFAZOLIN SODIUM-DEXTROSE 2-4 GM/100ML-% IV SOLN
INTRAVENOUS | Status: AC
  Filled 2020-03-07: qty 100

## 2020-03-07 MED ORDER — ONDANSETRON HCL 4 MG/2ML IJ SOLN
INTRAMUSCULAR | Status: DC | PRN
  Administered 2020-03-07: 4 mg via INTRAVENOUS

## 2020-03-07 MED ORDER — FAMOTIDINE 20 MG PO TABS
ORAL_TABLET | ORAL | Status: AC
  Administered 2020-03-07: 20 mg via ORAL
  Filled 2020-03-07: qty 1

## 2020-03-07 SURGICAL SUPPLY — 35 items
BAG DRAIN CYSTO-URO LG1000N (MISCELLANEOUS) ×4 IMPLANT
BASKET ZERO TIP 1.9FR (BASKET) ×4 IMPLANT
BRUSH SCRUB EZ  4% CHG (MISCELLANEOUS) ×2
BRUSH SCRUB EZ 1% IODOPHOR (MISCELLANEOUS) ×4 IMPLANT
BRUSH SCRUB EZ 4% CHG (MISCELLANEOUS) ×2 IMPLANT
CATH URETL 5X70 OPEN END (CATHETERS) ×4 IMPLANT
CNTNR SPEC 2.5X3XGRAD LEK (MISCELLANEOUS) ×2
CONT SPEC 4OZ STER OR WHT (MISCELLANEOUS) ×2
CONTAINER SPEC 2.5X3XGRAD LEK (MISCELLANEOUS) ×2 IMPLANT
DRAPE UTILITY 15X26 TOWEL STRL (DRAPES) ×4 IMPLANT
DRSG TELFA 4X3 1S NADH ST (GAUZE/BANDAGES/DRESSINGS) ×4 IMPLANT
FIBER LASER TRAC TIP (UROLOGICAL SUPPLIES) ×4 IMPLANT
FORCEPS BIOP PIRANHA Y (CUTTING FORCEPS) IMPLANT
GLOVE BIOGEL PI IND STRL 7.5 (GLOVE) ×2 IMPLANT
GLOVE BIOGEL PI INDICATOR 7.5 (GLOVE) ×2
GOWN STRL REUS W/ TWL LRG LVL3 (GOWN DISPOSABLE) ×2 IMPLANT
GOWN STRL REUS W/ TWL XL LVL3 (GOWN DISPOSABLE) ×2 IMPLANT
GOWN STRL REUS W/TWL LRG LVL3 (GOWN DISPOSABLE) ×2
GOWN STRL REUS W/TWL XL LVL3 (GOWN DISPOSABLE) ×2
GUIDEWIRE STR DUAL SENSOR (WIRE) ×4 IMPLANT
INFUSOR MANOMETER BAG 3000ML (MISCELLANEOUS) ×4 IMPLANT
INTRODUCER DILATOR DOUBLE (INTRODUCER) IMPLANT
KIT TURNOVER CYSTO (KITS) ×4 IMPLANT
PACK CYSTO AR (MISCELLANEOUS) ×4 IMPLANT
SET CYSTO W/LG BORE CLAMP LF (SET/KITS/TRAYS/PACK) ×4 IMPLANT
SHEATH URETERAL 12FRX35CM (MISCELLANEOUS) IMPLANT
SOL .9 NS 3000ML IRR  AL (IV SOLUTION) ×2
SOL .9 NS 3000ML IRR UROMATIC (IV SOLUTION) ×2 IMPLANT
STENT URET 6FRX24 CONTOUR (STENTS) ×8 IMPLANT
STENT URET 6FRX26 CONTOUR (STENTS) IMPLANT
SURGILUBE 2OZ TUBE FLIPTOP (MISCELLANEOUS) ×4 IMPLANT
SYR 10ML LL (SYRINGE) ×4 IMPLANT
VALVE UROSEAL ADJ ENDO (VALVE) ×4 IMPLANT
WATER STERILE IRR 1000ML POUR (IV SOLUTION) ×4 IMPLANT
WATER STERILE IRR 3000ML UROMA (IV SOLUTION) ×4 IMPLANT

## 2020-03-07 NOTE — Anesthesia Preprocedure Evaluation (Signed)
Anesthesia Evaluation  Patient identified by MRN, date of birth, ID band Patient awake    Reviewed: Allergy & Precautions, H&P , NPO status , Patient's Chart, lab work & pertinent test results  History of Anesthesia Complications Negative for: history of anesthetic complications  Airway Mallampati: III  TM Distance: <3 FB Neck ROM: limited    Dental  (+) Chipped, Poor Dentition, Missing, Dental Advidsory Given   Pulmonary neg pulmonary ROS, neg shortness of breath, neg recent URI, former smoker,           Cardiovascular Exercise Tolerance: Good hypertension, (-) angina+ CAD and +CHF  (-) Past MI and (-) Cardiac Stents + dysrhythmias Atrial Fibrillation (-) Valvular Problems/Murmurs     Neuro/Psych negative neurological ROS  negative psych ROS   GI/Hepatic negative GI ROS, Neg liver ROS,   Endo/Other  diabetes, Type 2  Renal/GU Renal disease (kidney stones)  negative genitourinary   Musculoskeletal   Abdominal   Peds  Hematology negative hematology ROS (+)   Anesthesia Other Findings Past Medical History: No date: A-fib (HCC) No date: Arthritis No date: CAD (coronary artery disease) No date: Cardiomyopathy ([place]) No date: CHF (congestive heart failure) (HCC) No date: Diabetes mellitus without complication (HCC) No date: Hyperlipidemia No date: Hypertension  Past Surgical History: No date: ABDOMINAL HYSTERECTOMY No date: APPENDECTOMY No date: BREAST BIOPSY; Right     Comment:  negative 1974 negative 2015 12/05/2015: CHOLECYSTECTOMY; N/A     Comment:  Procedure: LAPAROSCOPIC CHOLECYSTECTOMY WITH               INTRAOPERATIVE CHOLANGIOGRAM;  Surgeon: Leonie Green, MD;  Location: [facility] ORS;  Service: General;                Laterality: N/A; 10/18/2016: COLONOSCOPY WITH PROPOFOL; N/A     Comment:  Procedure: COLONOSCOPY WITH PROPOFOL;  Surgeon: Manya Silvas, MD;  Location: [facility]  ENDOSCOPY;  Service:               Endoscopy;  Laterality: N/A; No date: JOINT REPLACEMENT     Comment:  both knees No date: TONSILLECTOMY     Reproductive/Obstetrics negative OB ROS                             Anesthesia Physical  Anesthesia Plan  ASA: III  Anesthesia Plan: General   Post-op Pain Management:    Induction: Intravenous  PONV Risk Score and Plan: Ondansetron, Dexamethasone and Treatment may vary due to age or medical condition  Airway Management Planned: Oral ETT  Additional Equipment:   Intra-op Plan:   Post-operative Plan: Extubation in OR  Informed Consent: I have reviewed the patients History and Physical, chart, labs and discussed the procedure including the risks, benefits and alternatives for the proposed anesthesia with the patient or authorized representative who has indicated his/her understanding and acceptance.     Dental Advisory Given  Plan Discussed with: Anesthesiologist, CRNA and Surgeon  Anesthesia Plan Comments: (Patient consented for risks of anesthesia including but not limited to:  - adverse reactions to medications - risk of intubation if required - damage to teeth, lips or other oral mucosa - sore throat or hoarseness - Damage to heart, brain, lungs or loss of life  Patient voiced understanding.)  Anesthesia Quick Evaluation

## 2020-03-07 NOTE — Transfer of Care (Signed)
Immediate Anesthesia Transfer of Care Note  Patient: Dorothy Gross  Procedure(s) Performed: CYSTOSCOPY/URETEROSCOPY/HOLMIUM LASER/STENT PLACEMENT (Bilateral Ureter) CYSTOSCOPY WITH RETROGRADE PYELOGRAM (Bilateral Ureter)  Patient Location: PACU  Anesthesia Type:General  Level of Consciousness: awake, alert  and oriented  Airway & Oxygen Therapy: Patient Spontanous Breathing and Patient connected to nasal cannula oxygen  Post-op Assessment: Report given to RN and Post -op Vital signs reviewed and stable  Post vital signs: Reviewed and stable  Last Vitals:  Vitals Value Taken Time  BP    Temp    Pulse    Resp    SpO2      Last Pain:  Vitals:   03/07/20 0812  TempSrc: Tympanic  PainSc: 0-No pain         Complications: No complications documented.

## 2020-03-07 NOTE — Anesthesia Procedure Notes (Signed)
Procedure Name: Intubation Date/Time: 03/07/2020 9:36 AM Performed by: Chanetta Marshall, CRNA Pre-anesthesia Checklist: Patient identified, Emergency Drugs available, Suction available and Patient being monitored Patient Re-evaluated:Patient Re-evaluated prior to induction Oxygen Delivery Method: Circle system utilized Preoxygenation: Pre-oxygenation with 100% oxygen Induction Type: IV induction Ventilation: Mask ventilation without difficulty Laryngoscope Size: McGraph and 3 Grade View: Grade I Tube type: Oral Number of attempts: 1 Airway Equipment and Method: Video-laryngoscopy Placement Confirmation: ETT inserted through vocal cords under direct vision,  positive ETCO2,  breath sounds checked- equal and bilateral and CO2 detector Secured at: 21 cm Tube secured with: Tape Dental Injury: Teeth and Oropharynx as per pre-operative assessment

## 2020-03-07 NOTE — H&P (Signed)
UROLOGY H&P UPDATE  Agree with prior H&P dated 02/06/2020.  69 year old female with recurrent gross hematuria.  Cystoscopy with Dr. Matilde Sprang was normal, however CT urogram showed urothelial enhancement on the left and right in the distal ureters, as well as moderate size bilateral lower pole nonobstructive stone.  Cardiac: RRR Lungs: CTA bilaterally  Laterality: Bilateral Procedure: Bilateral diagnostic ureteroscopy, possible biopsy, laser lithotripsy of stones, bilateral ureteral stent placement  Urine: Culture 7/1 <50 K mixed flora  We specifically discussed the risks ureteroscopy including bleeding, infection/sepsis, stent related symptoms including flank pain/urgency/frequency/incontinence/dysuria, ureteral injury, inability to access stone, or need for staged or additional procedures.  We discussed possible malignancy and need for further treatments in the future.  Billey Co, MD 03/07/2020

## 2020-03-07 NOTE — Op Note (Signed)
Date of procedure: 03/07/20  Preoperative diagnosis:  1. Gross hematuria       2.   Bilateral distal ureteral thickening on CT       3.   Bilateral renal stones  Postoperative diagnosis:  1. Same  Procedure: 1. Cystoscopy, bilateral retrograde pyelograms with intraoperative interpretation 2. Left diagnostic ureteroscopy, laser lithotripsy of left renal stone, left ureteral stent placement 3. Right diagnostic ureteroscopy, laser lithotripsy of right renal stone, right ureteral stent placement  Surgeon: Nickolas Madrid, MD  Anesthesia: General  Complications: None  Intraoperative findings:  1.  Normal cystoscopy with 30 and 70 degree lens, no abnormalities.  Ureteral orifices orthotopic bilaterally.  Cytology sent. 2.  Bilateral retrograde pyelograms with no distinct filling defects bilaterally.  Right kidney had a drooping lily appearance and was lower 3.  Bilateral diagnostic ureteroscopy with semirigid and flexible scope with no ureteral or renal mucosal abnormalities 4.  Uncomplicated dusting of bilateral renal stones 5.  Uncomplicated bilateral ureteral stent placement  EBL: Minimal  Specimens:  Urine for cytology  Drains: Bilateral 6 French by 24 cm ureteral stents  Indication: Dorothy Gross is a 69 y.o. patient with persistent gross hematuria and CT urogram showing some abnormal enhancement of the bilateral distal ureters, as well as bilateral renal stones.  She presents today for further evaluation of the ureteral abnormalities on CT, as well as treatment of her stones.  After reviewing the management options for treatment, they elected to proceed with the above surgical procedure(s). We have discussed the potential benefits and risks of the procedure, side effects of the proposed treatment, the likelihood of the patient achieving the goals of the procedure, and any potential problems that might occur during the procedure or recuperation. Informed consent has been  obtained.  Description of procedure:  The patient was taken to the operating room and general anesthesia was induced. SCDs were placed for DVT prophylaxis. The patient was placed in the dorsal lithotomy position, prepped and draped in the usual sterile fashion, and preoperative antibiotics were administered. A preoperative time-out was performed.   A 21 French rigid cystoscope was used to intubate the urethra and thorough cystoscopy was performed with a 30 degree lens.  Urine was sent for cytology.  A 70 degree lens was also used for closer inspection of the bladder neck and lateral walls.  The bladder was grossly normal throughout with no abnormalities, and the ureteral orifice ease were orthotopic bilaterally.  I started by performing a retrograde pyelogram on the left side with a 5 [place] access catheter which showed no specific filling defects or hydronephrosis.  A wire was then passed up to the kidney on the left side, and a semirigid ureteroscope was advanced alongside the wire and thorough ureteral inspection was performed.  The scope passed easily into the ureter, and the ureteral mucosa was grossly normal throughout with no narrowing or abnormalities.  A single channel flexible ureteroscope was then advanced over the wire under fluoroscopic vision up to the kidney and it passed easily.  Thorough inspection of the kidney revealed no abnormal mucosa or suspicious areas.  There were 2 stones in the lower pole, and using a 200 m laser fiber on settings of 0.3 J and 40 Hz these were fragmented to dust.  The stones were very soft.  Thorough inspection revealed no residual fragments.  Careful pullback ureteroscopy revealed no abnormalities and the ureter was widely patent.  I then turned my attention to the right side and a  retrograde pyelogram was performed again showing no filling defects, but the kidney did have a drooping lily appearance and was located lower.  A wire was passed up to the kidney  under fluoroscopic vision.  The single-channel semirigid ureteroscope was advanced alongside the wire and thorough inspection of the distal and mid ureter again showed no abnormalities.  A single channel flexible ureteroscope was advanced easily over the wire up into the kidney and thorough inspection again revealed no mucosal abnormalities.  There was a 1 cm stone located in the upper pole that I could not quite access, and a basket was used to the relocate this to the renal pelvis for fragmentation.  The 200 m laser fiber on after mentioned settings was again used to dust the stone to <1 mm fragments.  The wire was replaced through the scope and careful pullback ureteroscopy showed no abnormalities.  A 6 French by 24 cm ureteral stent was placed under fluoroscopic vision on the right side with a curl in the lower pole, and in the bladder under fluoroscopy.  I reentered the bladder with a rigid cystoscope and a sensor wire passed easily into the left kidney, and a 6 x 24 cm ureteral stent was placed on the left side with a hook in the upper pole, and curl in the pelvis.  There is brisk drainage of fluid through the side ports of both of the stents.  The bladder was drained and this concluded our procedure.  Disposition: Stable to PACU  Plan: Follow-up for stent removal in 1 week Follow-up cytology, no evidence of malignancy under direct vision  Nickolas Madrid, MD

## 2020-03-07 NOTE — Discharge Instructions (Signed)

## 2020-03-07 NOTE — Telephone Encounter (Signed)
Error

## 2020-03-07 NOTE — Anesthesia Postprocedure Evaluation (Signed)
Anesthesia Post Note  Patient: Dorothy Gross  Procedure(s) Performed: CYSTOSCOPY/URETEROSCOPY/HOLMIUM LASER/STENT PLACEMENT (Bilateral Ureter) CYSTOSCOPY WITH RETROGRADE PYELOGRAM (Bilateral Ureter)  Patient location during evaluation: PACU Anesthesia Type: General Level of consciousness: awake and alert Pain management: pain level controlled Vital Signs Assessment: post-procedure vital signs reviewed and stable Respiratory status: spontaneous breathing, nonlabored ventilation, respiratory function stable and patient connected to nasal cannula oxygen Cardiovascular status: blood pressure returned to baseline and stable Postop Assessment: no apparent nausea or vomiting Anesthetic complications: no   No complications documented.   Last Vitals:  Vitals:   03/07/20 1120 03/07/20 1145  BP:  (!) 146/58  Pulse:  65  Resp:  18  Temp: 36.6 C 36.5 C  SpO2:  97%    Last Pain:  Vitals:   03/07/20 1145  TempSrc: Temporal  PainSc: 0-No pain                 Precious Haws Kayzlee Wirtanen

## 2020-03-10 LAB — CYTOLOGY - NON PAP

## 2020-03-19 ENCOUNTER — Ambulatory Visit (INDEPENDENT_AMBULATORY_CARE_PROVIDER_SITE_OTHER): Payer: Medicare HMO | Admitting: Urology

## 2020-03-19 ENCOUNTER — Other Ambulatory Visit: Payer: Self-pay

## 2020-03-19 ENCOUNTER — Encounter: Payer: Self-pay | Admitting: Urology

## 2020-03-19 VITALS — BP 168/87 | HR 60 | Ht 64.0 in | Wt 192.0 lb

## 2020-03-19 DIAGNOSIS — N200 Calculus of kidney: Secondary | ICD-10-CM | POA: Diagnosis not present

## 2020-03-19 DIAGNOSIS — R310 Gross hematuria: Secondary | ICD-10-CM

## 2020-03-19 NOTE — Progress Notes (Signed)
Cystoscopy Procedure Note:  Indication: Stent removal s/p bilateral diagnostic ureteroscopy for gross hematuria and ureteral thickening on CT, bilateral laser lithotripsy of lower pole stones, and bilateral ureteral stent placement  After informed consent and discussion of the procedure and its risks, Dorothy Gross was positioned and prepped in the standard fashion. Cystoscopy was performed with a flexible cystoscope. The stent was grasped with flexible graspers and removed in its entirety.  The scope was then re-passed into the bladder, and the contralateral stent removed.  The patient tolerated the procedure well.  Findings: Uncomplicated stent removal  Assessment and Plan: -No evidence of malignancy on diagnostic ureteroscopy bilaterally, urine cytology was negative -Follow up in 8 weeks with renal ultrasound to evaluate for silent hydronephrosis  Billey Co, MD 03/19/2020

## 2020-03-20 LAB — URINALYSIS, COMPLETE
Bilirubin, UA: NEGATIVE
Glucose, UA: NEGATIVE
Ketones, UA: NEGATIVE
Nitrite, UA: NEGATIVE
Specific Gravity, UA: 1.015 (ref 1.005–1.030)
Urobilinogen, Ur: 0.2 mg/dL (ref 0.2–1.0)
pH, UA: 8.5 — ABNORMAL HIGH (ref 5.0–7.5)

## 2020-03-20 LAB — MICROSCOPIC EXAMINATION: RBC, Urine: >30 /hpf — AB (ref 0–2)

## 2020-03-26 NOTE — Interval H&P Note (Signed)
History and Physical Interval Note:  03/26/2020 12:31 PM  Dorothy Gross  has presented today for surgery, with the diagnosis of hematuria, kidney stones.  The various methods of treatment have been discussed with the patient and family. After consideration of risks, benefits and other options for treatment, the patient has consented to  Procedure(s): CYSTOSCOPY/URETEROSCOPY/HOLMIUM LASER/STENT PLACEMENT (Bilateral) CYSTOSCOPY WITH RETROGRADE PYELOGRAM (Bilateral) as a surgical intervention.  The patient's history has been reviewed, patient examined, no change in status, stable for surgery.  I have reviewed the patient's chart and labs.  Questions were answered to the patient's satisfaction.     Billey Co

## 2020-05-01 ENCOUNTER — Other Ambulatory Visit: Payer: Self-pay | Admitting: Urology

## 2020-05-01 DIAGNOSIS — N200 Calculus of kidney: Secondary | ICD-10-CM

## 2020-05-16 ENCOUNTER — Ambulatory Visit
Admission: RE | Admit: 2020-05-16 | Discharge: 2020-05-16 | Disposition: A | Discharge status: Home / Self Care | Payer: Medicare HMO | Source: Ambulatory Visit | Attending: Urology | Admitting: Urology

## 2020-05-16 ENCOUNTER — Other Ambulatory Visit: Payer: Self-pay

## 2020-05-16 DIAGNOSIS — N200 Calculus of kidney: Secondary | ICD-10-CM | POA: Diagnosis not present

## 2020-05-16 IMAGING — US US RENAL
1 series · 14 of 25 positions shown · non-contrast
Comparison: None.

CLINICAL DATA: Nephrolithiasis

EXAM:
RENAL / URINARY TRACT ULTRASOUND COMPLETE

[Series 1: us renal · 0.23mm/px · 14 of 33 slices shown]
[im 1/33]
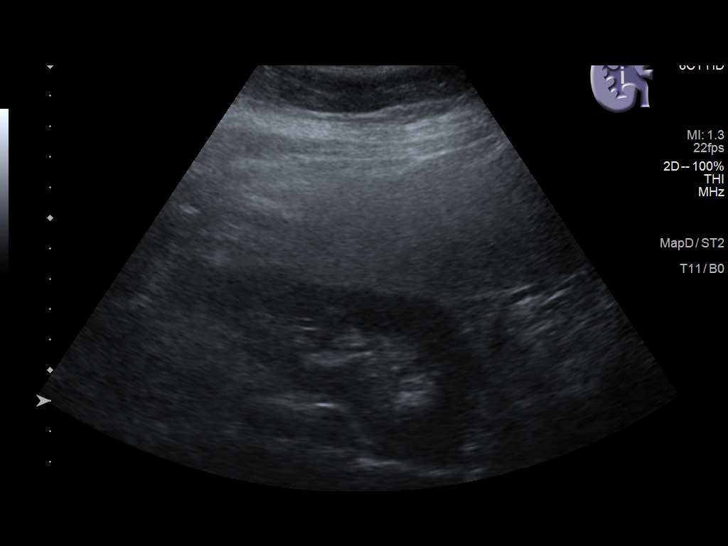
[im 3/33]
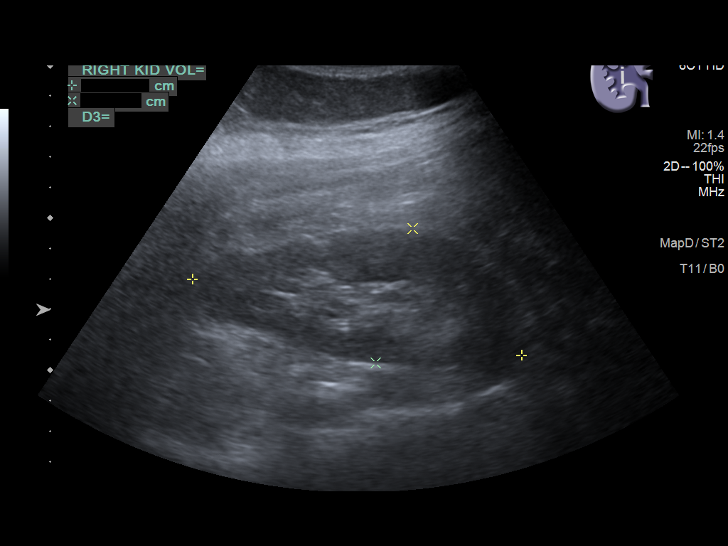
[im 6/33]
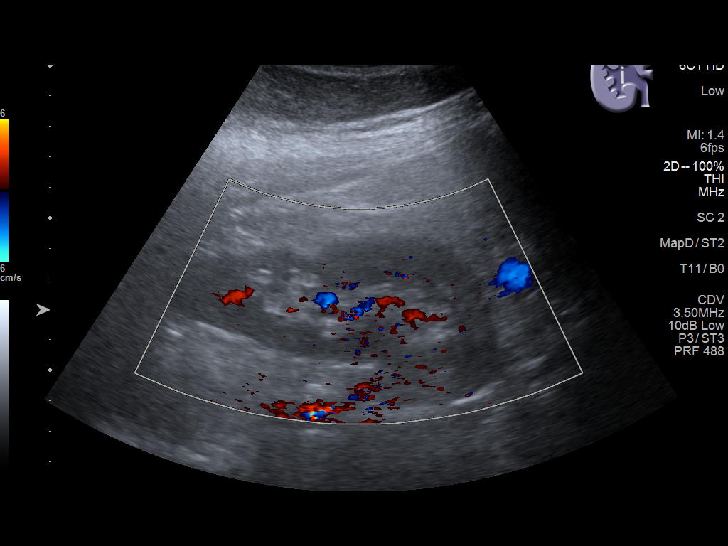
[im 9/33]
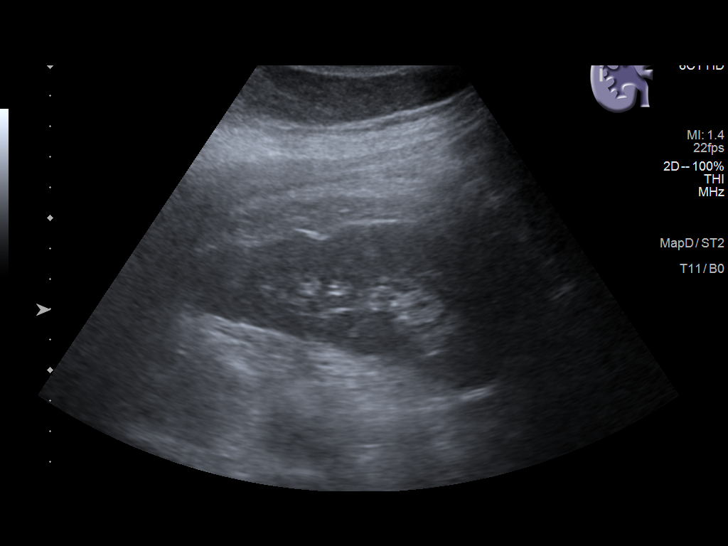
[im 11/33]
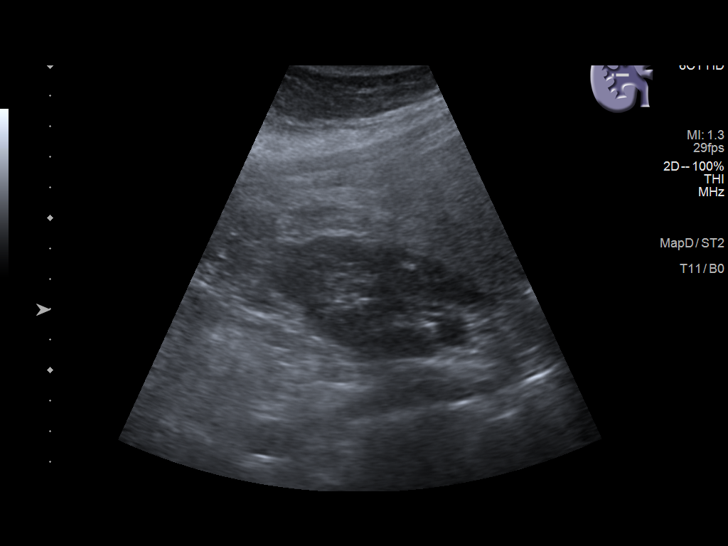
[im 13/33]
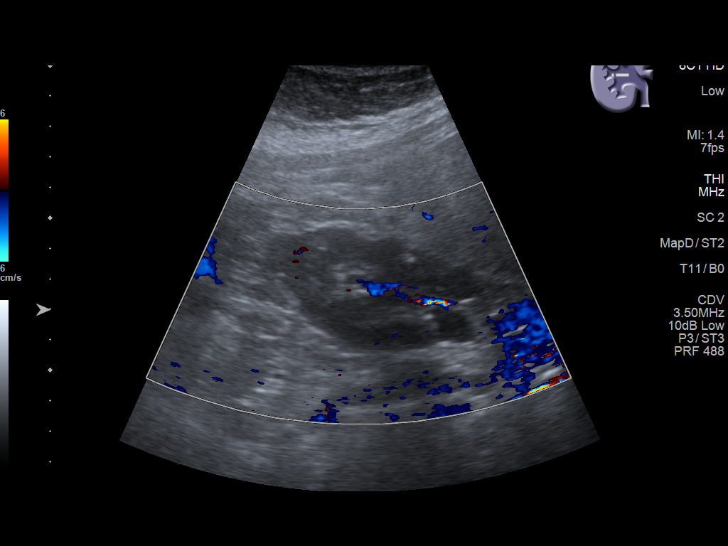
[im 15/33]
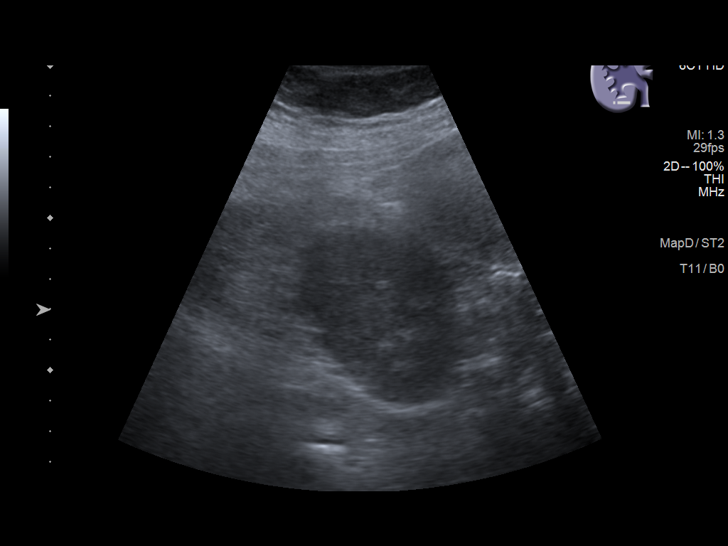
[im 18/33]
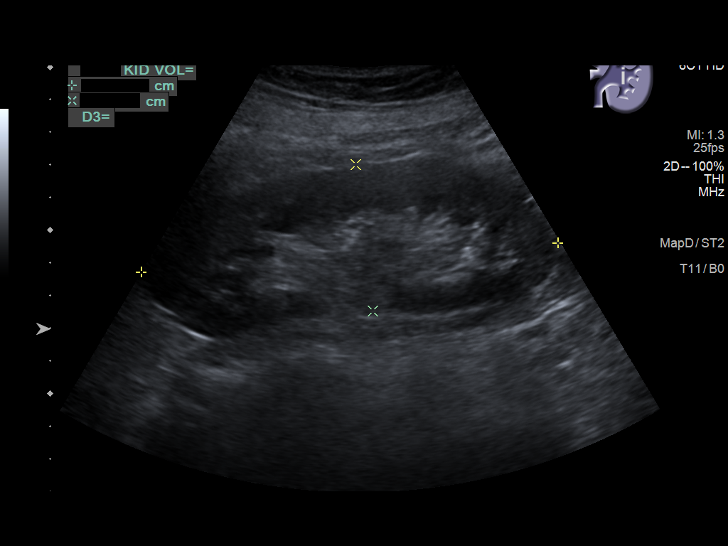
[im 21/33]
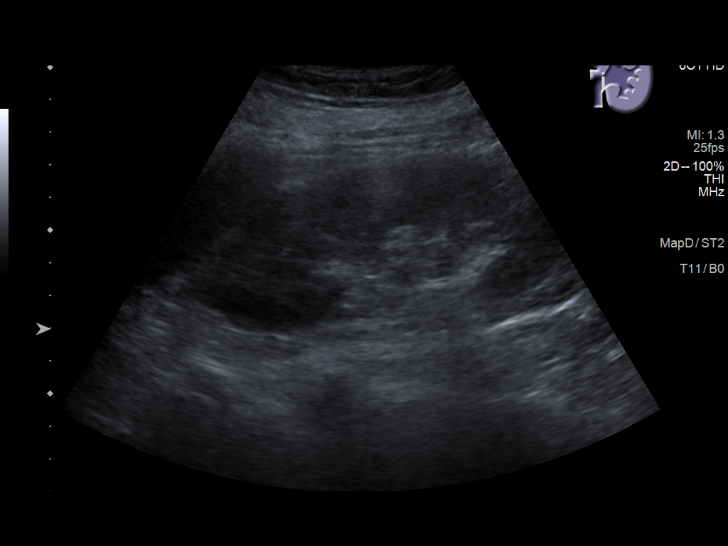
[im 22/33]
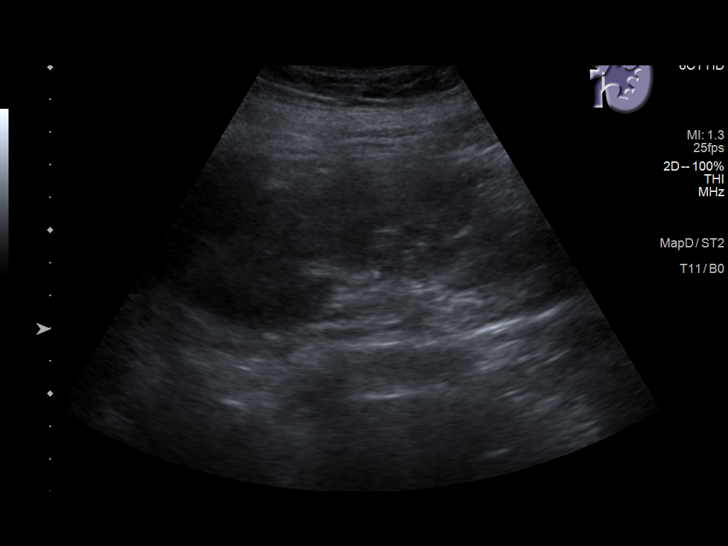
[im 25/33]
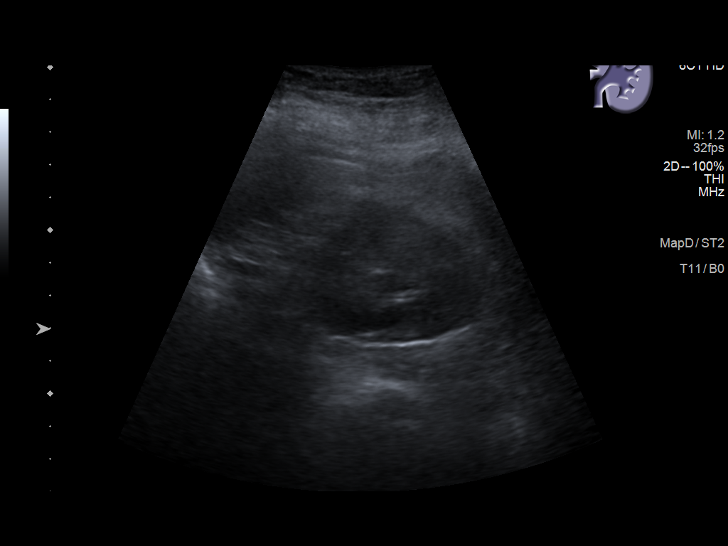
[im 27/33]
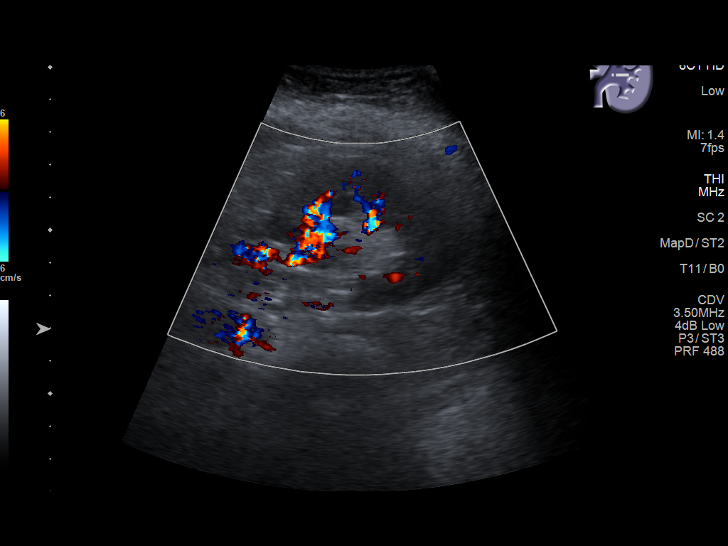
[im 30/33]
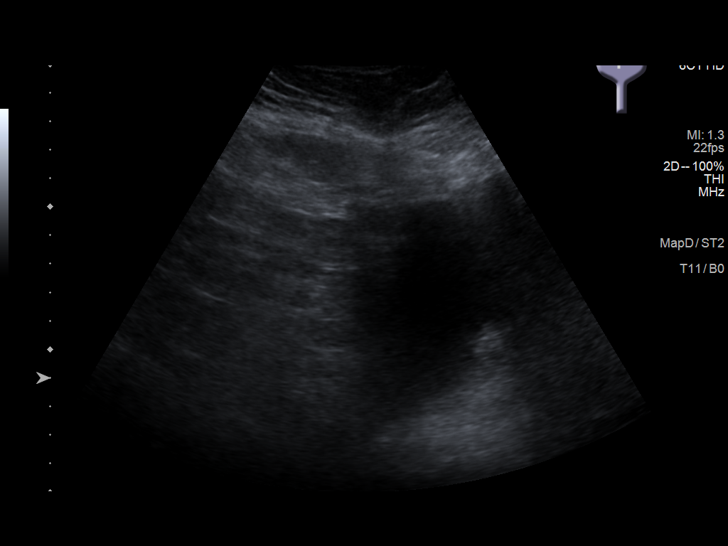
[im 33/33]
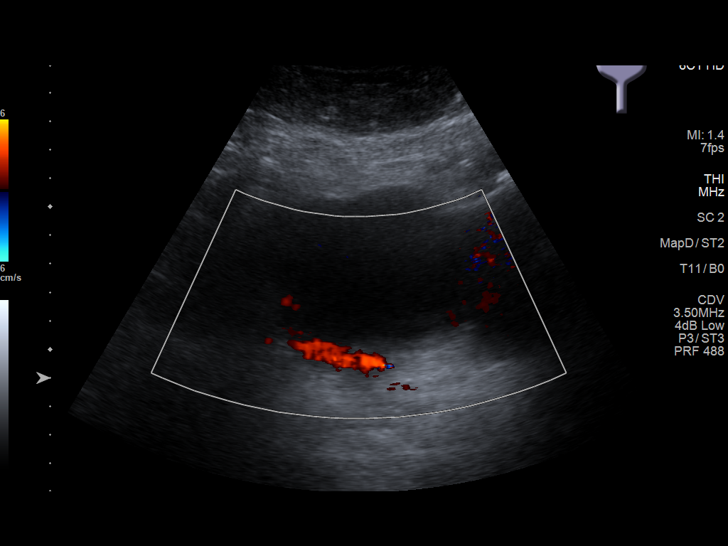

[14 of 25 positions shown; findings below may reference images not displayed]

FINDINGS: Right Kidney:

Renal measurements: 11.1 x 4.6 x 6.3 cm = volume: 167 mL.
Echogenicity within normal limits. No mass or hydronephrosis
visualized.

Left Kidney:

Renal measurements: 12.8 x 4.5 x 5.0 cm = volume: 150 mL.
Echogenicity within normal limits. No mass or hydronephrosis
visualized.

Bladder:

Appears normal for degree of bladder distention.

Other:

None.
IMPRESSION: Normal study.  No nephrolithiasis identified on this study.

## 2020-05-21 ENCOUNTER — Other Ambulatory Visit: Payer: Self-pay

## 2020-05-21 ENCOUNTER — Ambulatory Visit: Payer: Medicare HMO | Admitting: Urology

## 2020-05-21 ENCOUNTER — Encounter: Payer: Self-pay | Admitting: Urology

## 2020-05-21 VITALS — BP 153/84 | HR 64 | Ht 64.0 in | Wt 200.0 lb

## 2020-05-21 DIAGNOSIS — N200 Calculus of kidney: Secondary | ICD-10-CM

## 2020-05-21 DIAGNOSIS — R9389 Abnormal findings on diagnostic imaging of other specified body structures: Secondary | ICD-10-CM | POA: Diagnosis not present

## 2020-05-21 NOTE — Patient Instructions (Signed)
Dietary Guidelines to Help Prevent Kidney Stones Kidney stones are deposits of minerals and salts that form inside your kidneys. Your risk of developing kidney stones may be greater depending on your diet, your lifestyle, the medicines you take, and whether you have certain medical conditions. Most people can reduce their chances of developing kidney stones by following the instructions below. Depending on your overall health and the type of kidney stones you tend to develop, your dietitian may give you more specific instructions. What are tips for following this plan? Reading food labels  Choose foods with "no salt added" or "low-salt" labels. Limit your sodium intake to less than 1500 mg per day.  Choose foods with calcium for each meal and snack. Try to eat about 300 mg of calcium at each meal. Foods that contain 200-500 mg of calcium per serving include: ? 8 oz (237 ml) of milk, fortified nondairy milk, and fortified fruit juice. ? 8 oz (237 ml) of kefir, yogurt, and soy yogurt. ? 4 oz (118 ml) of tofu. ? 1 oz of cheese. ? 1 cup (300 g) of dried figs. ? 1 cup (91 g) of cooked broccoli. ? 1-3 oz can of sardines or mackerel.  Most people need 1000 to 1500 mg of calcium each day. Talk to your dietitian about how much calcium is recommended for you. Shopping  Buy plenty of fresh fruits and vegetables. Most people do not need to avoid fruits and vegetables, even if they contain nutrients that may contribute to kidney stones.  When shopping for convenience foods, choose: ? Whole pieces of fruit. ? Premade salads with dressing on the side. ? Low-fat fruit and yogurt smoothies.  Avoid buying frozen meals or prepared deli foods.  Look for foods with live cultures, such as yogurt and kefir. Cooking  Do not add salt to food when cooking. Place a salt shaker on the table and allow each person to add his or her own salt to taste.  Use vegetable protein, such as beans, textured vegetable  protein (TVP), or tofu instead of meat in pasta, casseroles, and soups. Meal planning   Eat less salt, if told by your dietitian. To do this: ? Avoid eating processed or premade food. ? Avoid eating fast food.  Eat less animal protein, including cheese, meat, poultry, or fish, if told by your dietitian. To do this: ? Limit the number of times you have meat, poultry, fish, or cheese each week. Eat a diet free of meat at least 2 days a week. ? Eat only one serving each day of meat, poultry, fish, or seafood. ? When you prepare animal protein, cut pieces into small portion sizes. For most meat and fish, one serving is about the size of one deck of cards.  Eat at least 5 servings of fresh fruits and vegetables each day. To do this: ? Keep fruits and vegetables on hand for snacks. ? Eat 1 piece of fruit or a handful of berries with breakfast. ? Have a salad and fruit at lunch. ? Have two kinds of vegetables at dinner.  Limit foods that are high in a substance called oxalate. These include: ? Spinach. ? Rhubarb. ? Beets. ? Potato chips and french fries. ? Nuts.  If you regularly take a diuretic medicine, make sure to eat at least 1-2 fruits or vegetables high in potassium each day. These include: ? Avocado. ? Banana. ? Orange, prune, carrot, or tomato juice. ? Baked potato. ? Cabbage. ? Beans and split   peas. General instructions   Drink enough fluid to keep your urine clear or pale yellow. This is the most important thing you can do.  Talk to your health care provider and dietitian about taking daily supplements. Depending on your health and the cause of your kidney stones, you may be advised: ? Not to take supplements with vitamin C. ? To take a calcium supplement. ? To take a daily probiotic supplement. ? To take other supplements such as magnesium, fish oil, or vitamin B6.  Take all medicines and supplements as told by your health care provider.  Limit alcohol intake to no  more than 1 drink a day for nonpregnant women and 2 drinks a day for men. One drink equals 12 oz of beer, 5 oz of wine, or 1 oz of hard liquor.  Lose weight if told by your health care provider. Work with your dietitian to find strategies and an eating plan that works best for you. What foods are not recommended? Limit your intake of the following foods, or as told by your dietitian. Talk to your dietitian about specific foods you should avoid based on the type of kidney stones and your overall health. Grains Breads. Bagels. Rolls. Baked goods. Salted crackers. Cereal. Pasta. Vegetables Spinach. Rhubarb. Beets. Canned vegetables. Pickles. Olives. Meats and other protein foods Nuts. Nut butters. Large portions of meat, poultry, or fish. Salted or cured meats. Deli meats. Hot dogs. Sausages. Dairy Cheese. Beverages Regular soft drinks. Regular vegetable juice. Seasonings and other foods Seasoning blends with salt. Salad dressings. Canned soups. Soy sauce. Ketchup. Barbecue sauce. Canned pasta sauce. Casseroles. Pizza. Lasagna. Frozen meals. Potato chips. French fries. Summary  You can reduce your risk of kidney stones by making changes to your diet.  The most important thing you can do is drink enough fluid. You should drink enough fluid to keep your urine clear or pale yellow.  Ask your health care provider or dietitian how much protein from animal sources you should eat each day, and also how much salt and calcium you should have each day. This information is not intended to replace advice given to you by your health care provider. Make sure you discuss any questions you have with your health care provider. Document Revised: 12/06/2018 Document Reviewed: 07/27/2016 Elsevier Patient Education  2020 [employer]  

## 2020-05-21 NOTE — Progress Notes (Signed)
   05/21/2020 11:04 AM   Dorothy Gross 12-Jul-1951 [id number]  Reason for visit: Follow up ureteral thickening/enhancement on CT, nephrolithiasis  HPI: I saw [name] in urology clinic today for follow-up of the above issues.  Briefly, she is a 69 year old female who underwent a gross hematuria work-up and had some abnormal ureteral thickening and enhancement of the left side on CT, as well as bilateral moderate renal stone burden.  She underwent an uncomplicated bilateral diagnostic ureteroscopy in July 2021, as well as laser lithotripsy of her stones.  Her bilateral stents have since been removed.  She denies any problems over the last few months, specifically any flank pain, stone episodes, or gross hematuria.  I personally reviewed her follow-up renal ultrasound dated 05/18/2020 that shows no hydronephrosis or nephrolithiasis.  We discussed general stone prevention strategies including adequate hydration with goal of producing 2.5 L of urine daily, increasing citric acid intake, increasing calcium intake during high oxalate meals, minimizing animal protein, and decreasing salt intake. Information about dietary recommendations given today.   She also had some adrenal thickening on her original CT, and has follow-up scheduled with endocrine next week for metabolic work-up.  Finally we discussed return precautions including recurrence of gross hematuria or flank pain.  Keep follow-up with endocrine next week RTC 1 year M[place] Chapel, MD  [facility] [address], [place], [place] [address] [phone]

## 2020-05-26 ENCOUNTER — Other Ambulatory Visit: Payer: Self-pay

## 2020-05-26 ENCOUNTER — Encounter: Payer: Self-pay | Admitting: Internal Medicine

## 2020-05-26 ENCOUNTER — Ambulatory Visit (INDEPENDENT_AMBULATORY_CARE_PROVIDER_SITE_OTHER): Payer: Medicare HMO | Admitting: Internal Medicine

## 2020-05-26 VITALS — BP 144/80 | HR 66 | Ht 64.0 in | Wt 200.6 lb

## 2020-05-26 DIAGNOSIS — D3500 Benign neoplasm of unspecified adrenal gland: Secondary | ICD-10-CM

## 2020-05-26 DIAGNOSIS — E041 Nontoxic single thyroid nodule: Secondary | ICD-10-CM

## 2020-05-26 LAB — BASIC METABOLIC PANEL
BUN: 15 mg/dL (ref 6–23)
CO2: 28 mEq/L (ref 19–32)
Calcium: 9.4 mg/dL (ref 8.4–10.5)
Chloride: 100 mEq/L (ref 96–112)
Creatinine, Ser: 0.78 mg/dL (ref 0.40–1.20)
GFR: 73.69 mL/min (ref >60.00)
Glucose, Bld: 166 mg/dL — ABNORMAL HIGH (ref 70–99)
Potassium: 3.8 mEq/L (ref 3.5–5.1)
Sodium: 139 mEq/L (ref 135–145)

## 2020-05-26 LAB — TSH: TSH: 2.46 u[IU]/mL (ref 0.35–4.50)

## 2020-05-26 LAB — T4, FREE: Free T4: 1.34 ng/dL (ref 0.60–1.60)

## 2020-05-26 NOTE — Patient Instructions (Signed)
For the adrenal gland:   - Please stop by the lab today  - Will schedule you for a follow up CT scan at [place] [facility]   - 24-Hour Urine Collection   You will be collecting your urine for a 24-hour period of time.  Your timer starts with your first urine of the morning (For example - If you first pee at [place], your timer will start at [place])  [place] away your first urine of the morning  Collect your urine every time you pee for the next 24 hours STOP your urine collection 24 hours after you started the collection (For example - You would stop at 9AM the day after you started)    For the thyroid gland: Will proceed with ultrasound at [facility]

## 2020-05-26 NOTE — Progress Notes (Signed)
Name: Dorothy Gross  MRN/ DOB: [id number], 1951-07-18    Age/ Sex: 69 y.o., female    PCP: Maryland Pink, MD   Reason for Endocrinology Evaluation: Adrenal adenoma      Date of Initial Endocrinology Evaluation: 05/26/2020     HPI: Dorothy Gross is a 69 y.o. female with a past medical history of A. Fib, CHF , HTN and nephrolithiasis. The patient presented for initial endocrinology clinic visit on 05/26/2020 for consultative assistance with her adrenal adenoma .   Pt had an incidental finding of bilateral adrenal nodularity bilaterally L>R, with a HFU of 40 and 95 post-contrast on CT scan in 12/2019 during painless hematuria workup.     Adrenal incidentaloma: Substantial weight gain- no Centripetal obesity- no Easy bruisbility-on Eliquis  Severe hypertension- no DM-yes  Proximal muscle weakness- no Fatigue- no Sudden/ severe headaches- no Weight loss-no Anxiety attacks- no Sweating- no Cardiac arrhythmias- A. Fib  Palpitations- no Fluid retention- no Hypokalemia- no    NO FH of adrenal gland issues, has strong FH of HTN NO FH of renal stones       HISTORY:  Past Medical History:  Past Medical History:  Diagnosis Date  . A-fib ([place])   . Arthritis   . CAD (coronary artery disease)   . Cardiomyopathy ([place])   . CHF (congestive heart failure) ([place])   . Diabetes mellitus without complication ([place])   . History of kidney stones   . Hyperlipidemia   . Hypertension     Past Surgical History:  Past Surgical History:  Procedure Laterality Date  . ABDOMINAL HYSTERECTOMY    . APPENDECTOMY    . BREAST EXCISIONAL BIOPSY Right [id number]   negative 1974 negative 2015  . CHOLECYSTECTOMY N/A 12/05/2015   Procedure: LAPAROSCOPIC CHOLECYSTECTOMY WITH INTRAOPERATIVE CHOLANGIOGRAM;  Surgeon: Leonie Green, MD;  Location: [facility] ORS;  Service: General;  Laterality: N/A;  . COLONOSCOPY WITH PROPOFOL N/A 10/18/2016   Procedure: COLONOSCOPY WITH PROPOFOL;   Surgeon: Manya Silvas, MD;  Location: [facility] ENDOSCOPY;  Service: Endoscopy;  Laterality: N/A;  . COLONOSCOPY WITH PROPOFOL N/A 03/17/2018   Procedure: COLONOSCOPY WITH PROPOFOL;  Surgeon: Manya Silvas, MD;  Location: [facility] ENDOSCOPY;  Service: Endoscopy;  Laterality: N/A;  . CYSTOSCOPY W/ RETROGRADES Bilateral 03/07/2020   Procedure: CYSTOSCOPY WITH RETROGRADE PYELOGRAM;  Surgeon: Billey Co, MD;  Location: [facility] ORS;  Service: Urology;  Laterality: Bilateral;  . CYSTOSCOPY/URETEROSCOPY/HOLMIUM LASER/STENT PLACEMENT Bilateral 03/07/2020   Procedure: CYSTOSCOPY/URETEROSCOPY/HOLMIUM LASER/STENT PLACEMENT;  Surgeon: Billey Co, MD;  Location: [facility] ORS;  Service: Urology;  Laterality: Bilateral;  . JOINT REPLACEMENT     both knees  . TONSILLECTOMY        Social History:  reports that she quit smoking about 12 years ago. Her smoking use included cigarettes. She has a 3.75 pack-year smoking history. She has never used smokeless tobacco. She reports that she does not drink alcohol and does not use drugs.  Family History: family history includes Diabetes in her maternal aunt, mother, and sister.   HOME MEDICATIONS: Allergies as of 05/26/2020   No Known Allergies     Medication List       Accurate as of May 26, 2020 12:45 PM. If you have any questions, ask your nurse or doctor.        amiodarone 200 MG tablet Commonly known as: PACERONE Take 200 mg by mouth daily.   Biotin 1000 MCG tablet Take 1,000 mcg by mouth daily.  calcium carbonate 1250 (500 Ca) MG tablet Commonly known as: OS-CAL - dosed in mg of elemental calcium Take 2 tablets by mouth daily with breakfast.   carvedilol 6.25 MG tablet Commonly known as: COREG Take 6.25 mg by mouth 2 (two) times daily with a meal.   [employer] Zinc 50 MG tablet Generic drug: zinc gluconate Take 50 mg by mouth daily.   cyanocobalamin 1000 MCG tablet Take 1,000 mcg by mouth daily.   digoxin 0.125 MG tablet Commonly known  as: LANOXIN Take 0.125 mg by mouth daily.   Eliquis 5 MG Tabs tablet Generic drug: apixaban Take 5 mg by mouth 2 (two) times daily.   ferrous sulfate 325 (65 FE) MG tablet Take 325 mg by mouth daily with breakfast.   FIBER SELECT GUMMIES PO Take 2 tablets by mouth daily.   FLAX SEED OIL PO Take 2 capsules by mouth daily.   fluticasone 50 MCG/ACT nasal spray Commonly known as: FLONASE Place 1 spray into both nostrils daily as needed for allergies.   furosemide 40 MG tablet Commonly known as: LASIX Take 40 mg by mouth daily.   Lancets Misc   losartan 50 MG tablet Commonly known as: COZAAR Take 50 mg by mouth daily.   metFORMIN 500 MG 24 hr tablet Commonly known as: GLUCOPHAGE-XR Take 1,000 mg by mouth every evening.   MULTIVITAMIN ADULT PO Take 1 tablet by mouth daily.   OneTouch Ultra test strip Generic drug: glucose blood USE 1 EACH (1 STRIP TOTAL) 3 (THREE) TIMES DAILY   rosuvastatin 10 MG tablet Commonly known as: CRESTOR Take 10 mg by mouth daily.   Vitamin D3 25 MCG (1000 UT) Caps Take 1,000 Units by mouth daily.         REVIEW OF SYSTEMS: A comprehensive ROS was conducted with the patient and is negative except as per HPI     OBJECTIVE:  VS: BP (!) 144/80 (BP Location: Left Arm, Patient Position: Sitting, Cuff Size: Normal)   Pulse 66   Ht 5\' 4"  (1.626 m)   Wt 200 lb 9.6 oz (91 kg)   SpO2 95%   BMI 34.43 kg/m    Wt Readings from Last 3 Encounters:  05/21/20 200 lb (90.7 kg)  03/19/20 192 lb (87.1 kg)  03/07/20 195 lb (88.5 kg)     EXAM: General: Pt appears well and is in NAD  Neck: General: Supple without adenopathy. Thyroid: Thyroid size normal.  Left thyroid nodule appreciated   Lungs: Clear with good BS bilat with no rales, rhonchi, or wheezes  Heart: Auscultation: RRR.  Abdomen: Normoactive bowel sounds, soft, nontender, without masses or organomegaly palpable  Extremities:  BL LE: No pretibial edema normal ROM and strength.    Skin: Hair: Texture and amount normal with gender appropriate distribution Skin Inspection: No rashes Skin Palpation: Skin temperature, texture, and thickness normal to palpation  Neuro: Cranial nerves: II - XII grossly intact  Motor: Normal strength throughout DTRs: 2+ and symmetric in UE without delay in relaxation phase  Mental Status: Judgment, insight: Intact Orientation: Oriented to time, place, and person Mood and affect: No depression, anxiety, or agitation     DATA REVIEWED: Results for MIKE, BERNTSEN (MRN [id number]) as of 05/27/2020 08:28  Ref. Range 05/26/2020 14:20  Sodium Latest Ref Range: 135 - 145 mEq/L 139  Potassium Latest Ref Range: 3.5 - 5.1 mEq/L 3.8  Chloride Latest Ref Range: 96 - 112 mEq/L 100  CO2 Latest Ref Range: 19 - 32 mEq/L 28  Glucose Latest Ref Range: 70 - 99 mg/dL 166 (H)  BUN Latest Ref Range: 6 - 23 mg/dL 15  Creatinine Latest Ref Range: 0.40 - 1.20 mg/dL 0.78  Calcium Latest Ref Range: 8.4 - 10.5 mg/dL 9.4  GFR Latest Ref Range: >60.00 mL/min 73.69  TSH Latest Ref Range: 0.35 - 4.50 uIU/mL 2.46  T4,Free(Direct) Latest Ref Range: 0.60 - 1.60 ng/dL 1.34     CT Scan 01/09/2020  Adrenals/Urinary Tract: Mild adrenal nodularity bilaterally dominant area on the LEFT measuring 11 x 13 mm with baseline density of 40 Hounsfield units. Post-contrast density of nearly 95 Hounsfield units.  ASSESSMENT/PLAN/RECOMMENDATIONS:   1. Adrenal Adenoma:  - Eighty five percent of adrenal adenomas are nonsecretory. What's interesting here is that she has bilateral nodularity .  - Three forms of adrenal hyperfunction should be considered in patients with adrenal incidentaloma   Glucocorticoid hypersecretion  Primary hyperaldosteronism  Pheochromocytoma  Will proceed with aldo: renin check as well as 24-hr urine collection for cortisol , catecholamine and metanephrines.   Will also proceed with repeat imaging by next month   2. Left thyroid nodule :    - Pt is clinically euthyroid  - No local neck symptoms - TFT's levels are normal  - Will proceed with thyroid ultrasound     F/U in 6 months    Signed electronically by: Mack Guise, MD  [facility]  [facility] [address], [place], [place] [address] Phone: [phone] FAX: [phone]   CC: Maryland Pink, MD [address] [place] [address] Phone: [phone] Fax: [phone]   Return to Endocrinology clinic as below: Future Appointments  Date Time Provider [place]  05/26/2020  1:40 PM Eathen Budreau, Melanie Crazier, MD [facility] None  05/21/2021  9:00 AM Billey Co, MD [facility] None

## 2020-05-27 ENCOUNTER — Encounter: Payer: Self-pay | Admitting: Internal Medicine

## 2020-05-30 ENCOUNTER — Other Ambulatory Visit: Payer: Self-pay

## 2020-05-30 ENCOUNTER — Other Ambulatory Visit: Payer: Medicare HMO

## 2020-05-30 DIAGNOSIS — D3500 Benign neoplasm of unspecified adrenal gland: Secondary | ICD-10-CM

## 2020-05-31 LAB — CREATININE, URINE, 24 HOUR
Creatinine, 24H Ur: 936 mg/24 hr (ref 800–1800)
Creatinine, Urine: 47.4 mg/dL

## 2020-06-03 LAB — ALDOSTERONE + RENIN ACTIVITY W/ RATIO
ALDO / PRA Ratio: 13.8 Ratio (ref 0.9–28.9)
Aldosterone: 4 ng/dL
Renin Activity: 0.29 ng/mL/h (ref 0.25–5.82)

## 2020-06-06 LAB — CATECHOLAMINE+VMA, 24-HR URINE
Dopamine, Rand Ur: 69 ug/L
Epinephrine, Rand Ur: 3 ug/L
Norepinephrine, Rand Ur: 20 ug/L
VMA, Urine: 1.7 mg/L

## 2020-06-10 LAB — CORTISOL, URINE, FREE
Cortisol (Ur), Free: 26 ug/24 hr (ref 6–42)
Cortisol,F,ug/L,U: 13 ug/L

## 2020-06-10 LAB — METANEPHRINES, URINE, 24 HOUR
Metaneph Total, Ur: 114 ug/L
Metanephrines, 24H Ur: 225 ug/24 hr — ABNORMAL HIGH (ref 36–209)
Normetanephrine, 24H Ur: 383 ug/24 hr (ref 131–612)
Normetanephrine, Ur: 194 ug/L

## 2020-06-25 ENCOUNTER — Telehealth: Payer: Self-pay | Admitting: Internal Medicine

## 2020-06-25 ENCOUNTER — Ambulatory Visit: Payer: Medicare HMO

## 2020-06-25 ENCOUNTER — Other Ambulatory Visit: Payer: Self-pay

## 2020-06-25 ENCOUNTER — Ambulatory Visit
Admission: RE | Admit: 2020-06-25 | Discharge: 2020-06-25 | Disposition: A | Discharge status: Home / Self Care | Payer: Medicare HMO | Source: Ambulatory Visit | Attending: Internal Medicine | Admitting: Internal Medicine

## 2020-06-25 DIAGNOSIS — E041 Nontoxic single thyroid nodule: Secondary | ICD-10-CM | POA: Insufficient documentation

## 2020-06-25 DIAGNOSIS — E042 Nontoxic multinodular goiter: Secondary | ICD-10-CM

## 2020-06-25 IMAGING — US US THYROID
1 series · 13 of 25 positions shown · non-contrast
Comparison: None.

CLINICAL DATA: Left thyroid nodule on exam

EXAM:
THYROID ULTRASOUND
TECHNIQUE: Ultrasound examination of the thyroid gland and adjacent soft
tissues was performed.

[Series 1: us thyroid · 13 of 94 slices shown]
[im 1/94]
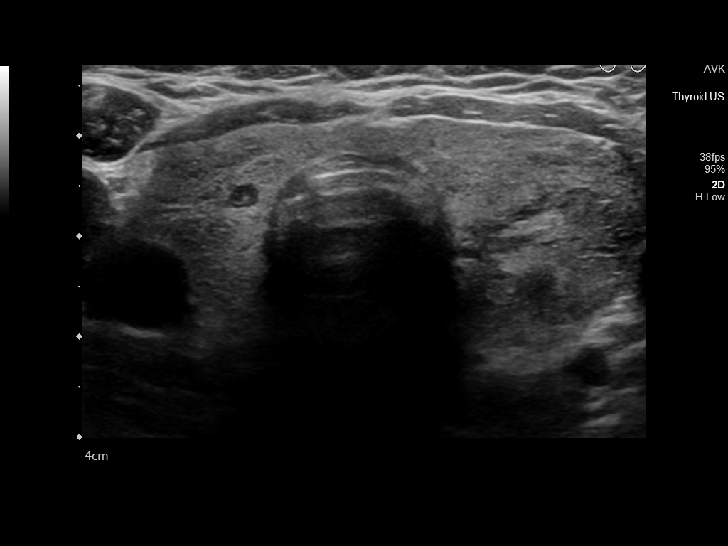
[im 8/94]
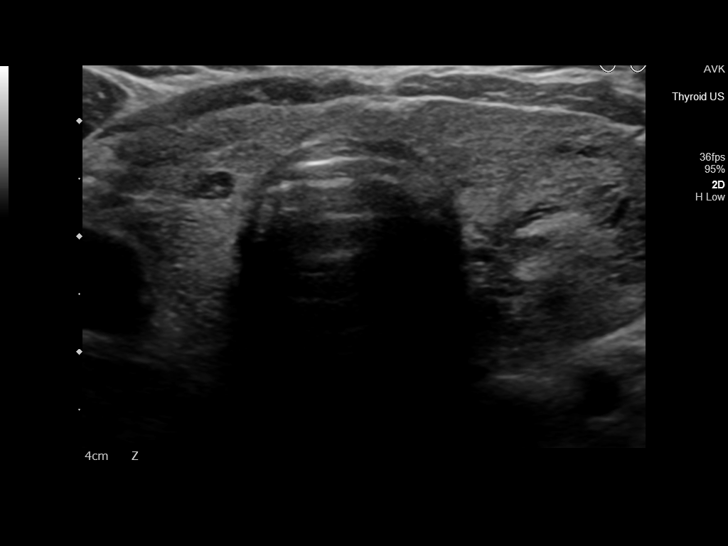
[im 16/94]
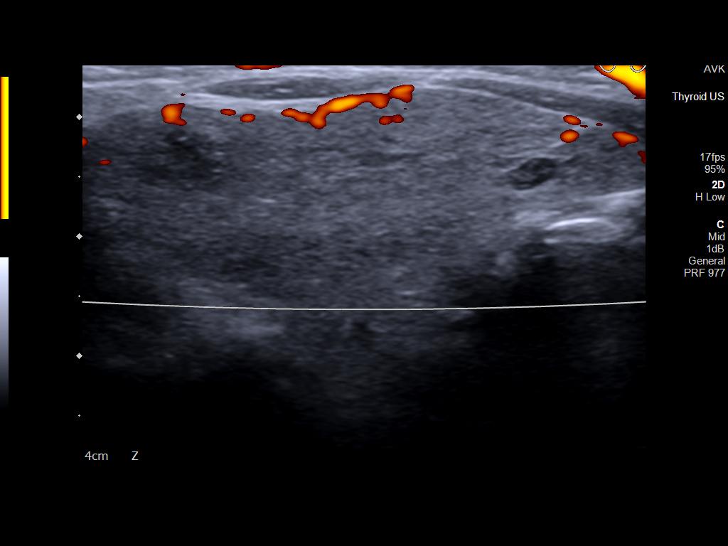
[im 24/94]
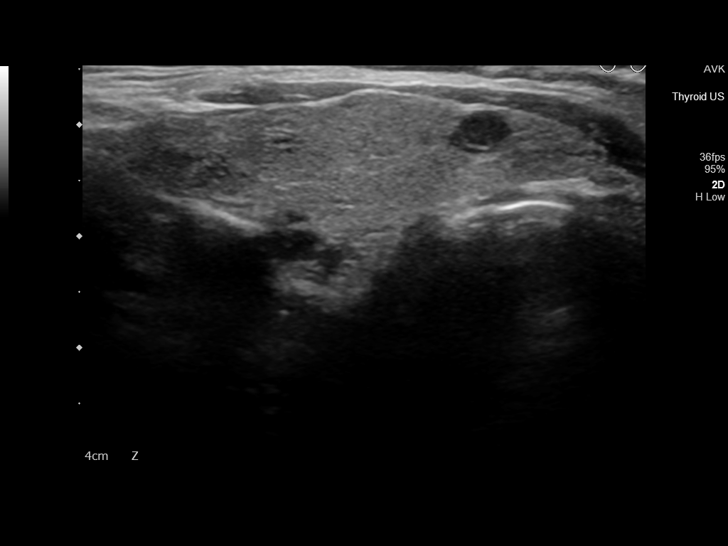
[im 32/94]
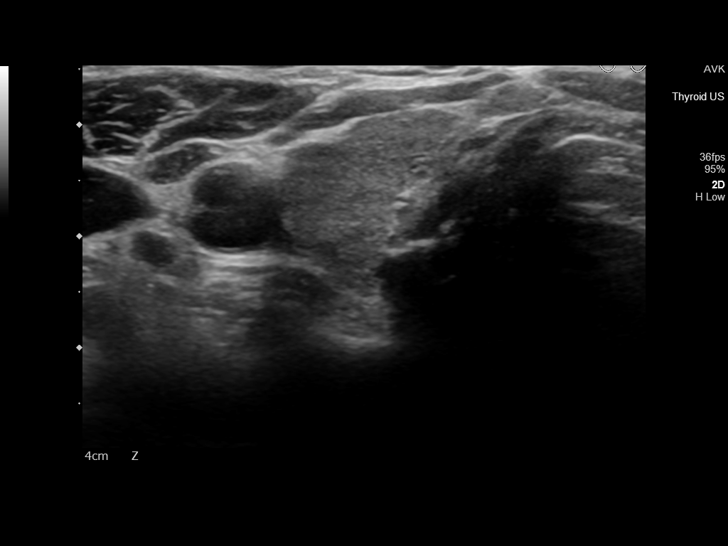
[im 39/94]
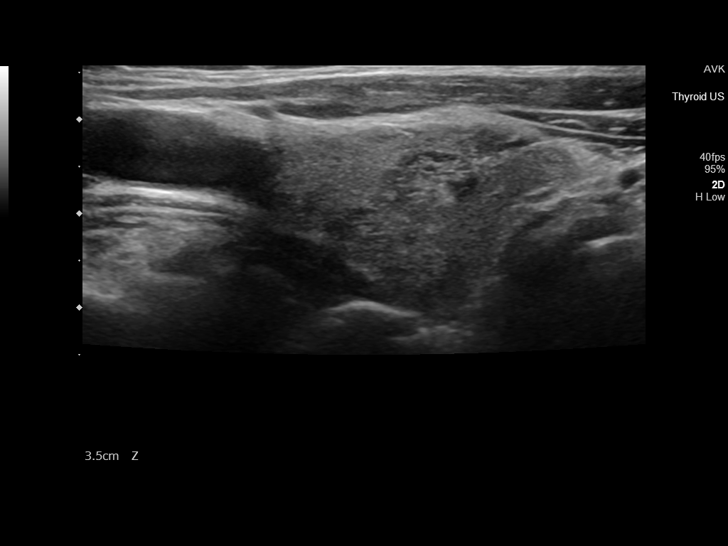
[im 47/94]
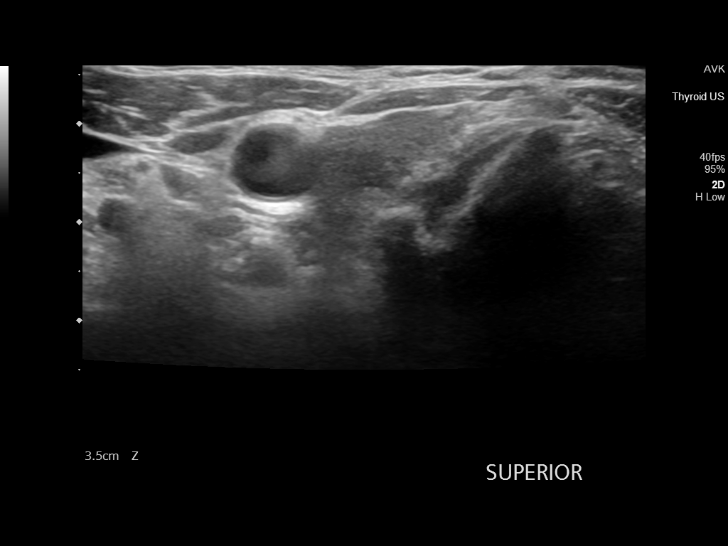
[im 55/94]
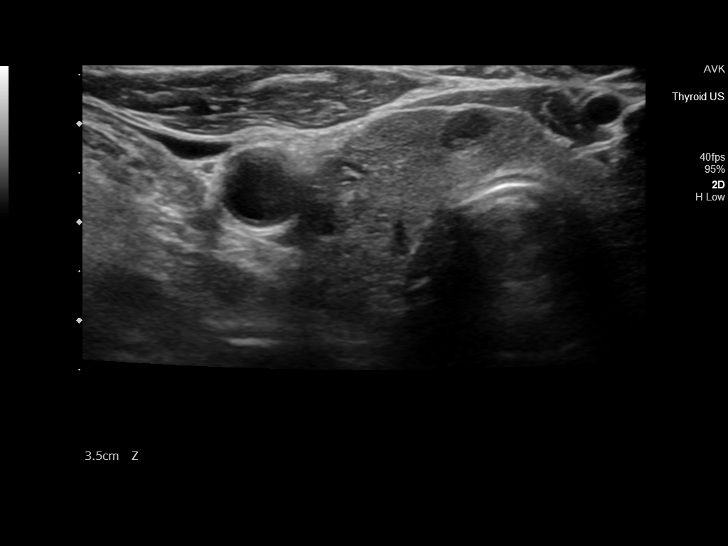
[im 63/94]
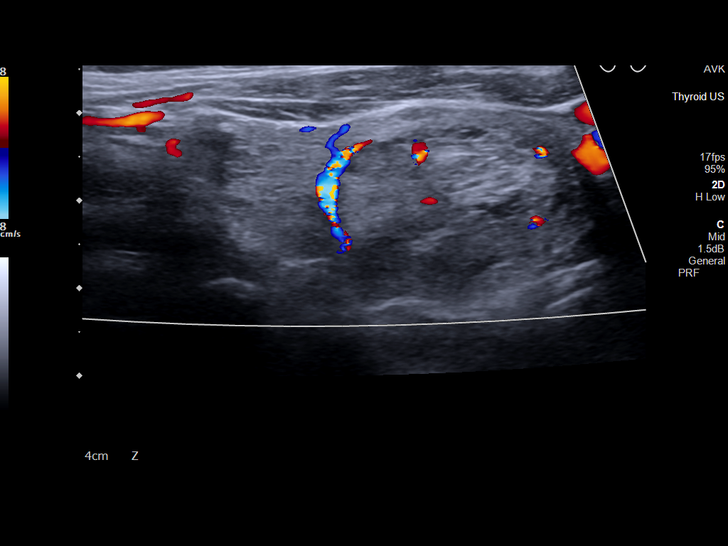
[im 70/94]
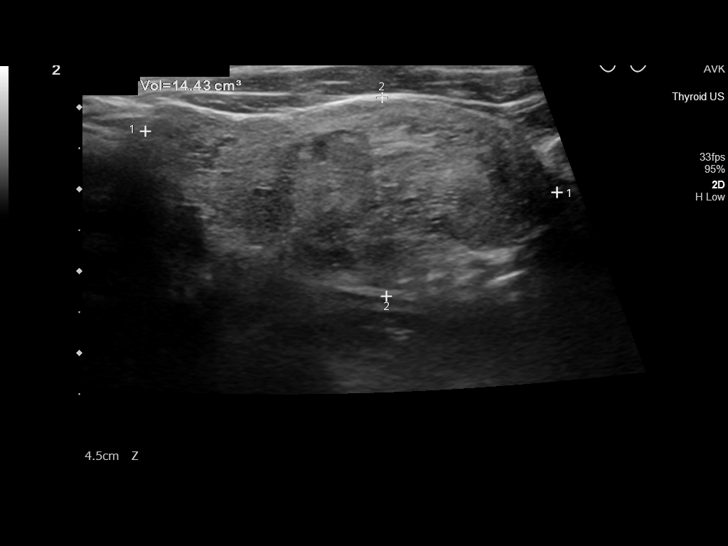
[im 78/94]
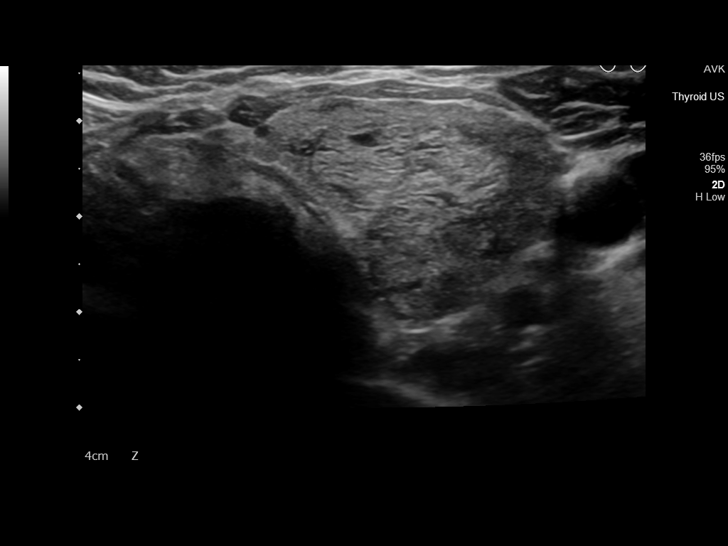
[im 86/94]
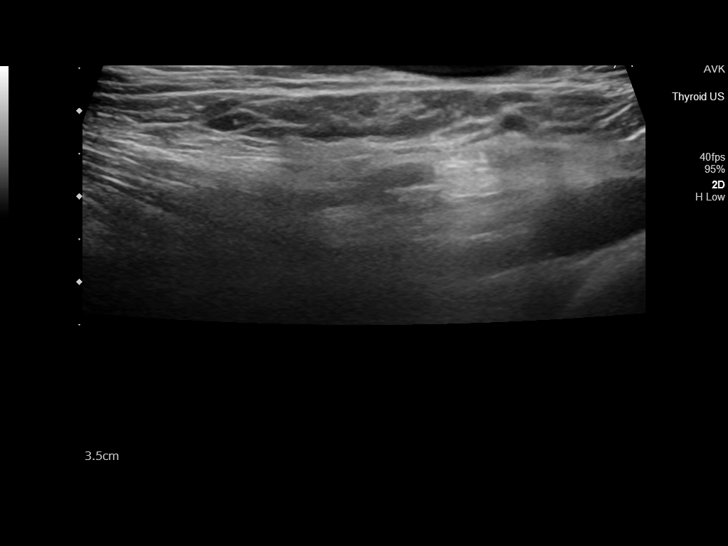
[im 94/94]
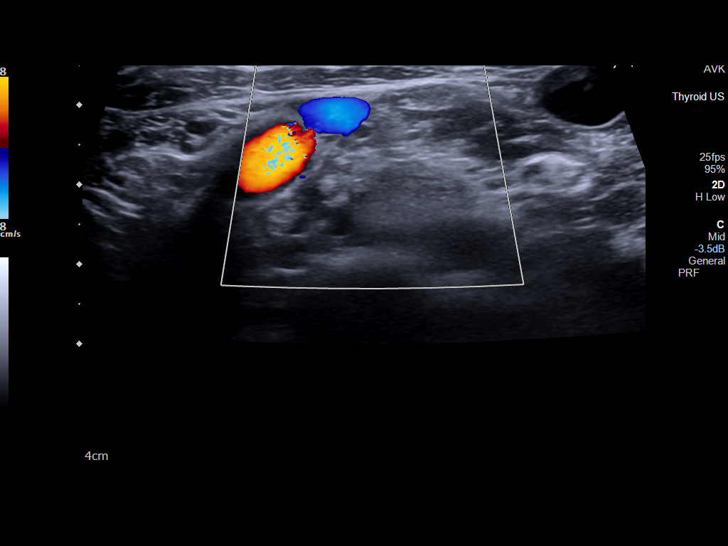

[13 of 25 positions shown; findings below may reference images not displayed]

FINDINGS: Parenchymal Echotexture: Moderately heterogenous

Isthmus: 4 mm

Right lobe: 4.6 x 1.9 x 1.5 cm

Left lobe: 5.1 x 2.4 x 2.5 cm

_________________________________________________________

Estimated total number of nodules >/= 1 cm: 2

Number of spongiform nodules >/=  2 cm not described below (TR1): 0

Number of mixed cystic and solid nodules >/= 1.5 cm not described
below (TR2): 0

_________________________________________________________

Nodule # 1:

Location: Right; Mid

Maximum size: 1.1 cm; Other 2 dimensions: 0.8 x 0.8 cm

Composition: solid/almost completely solid (2)

Echogenicity: isoechoic (1)

Shape: not taller-than-wide (0)

Margins: smooth (0)

Echogenic foci: none (0)

ACR TI-RADS total points: 3.

ACR TI-RADS risk category: TR3 (3 points).

ACR TI-RADS recommendations:

Given size (<1.4 cm) and appearance, this nodule does NOT meet
TI-RADS criteria for biopsy or dedicated follow-up.

_________________________________________________________

Nodule # 4:

Location: Left; Inferior

Maximum size: 2.8 cm; Other 2 dimensions: 2.4 x 2.2 cm

Composition: solid/almost completely solid (2)

Echogenicity: isoechoic (1)

Shape: not taller-than-wide (0)

Margins: ill-defined (0)

Echogenic foci: none (0)

ACR TI-RADS total points: 3.

ACR TI-RADS risk category: TR3 (3 points).

ACR TI-RADS recommendations:

**Given size (>/= 2.5 cm) and appearance, fine needle aspiration of
this mildly suspicious nodule should be considered based on TI-RADS
criteria.

_________________________________________________________

There are a few scattered additional subcentimeter hypoechoic and
mixed cystic/solid nodules, all 9 mm or less in size. These would
not meet criteria for any biopsy or follow-up and are not fully
described by TI rads criteria.

No hypervascularity.  No regional adenopathy.
IMPRESSION: 2.8 cm left inferior TR 3 nodule meets criteria for biopsy as above.

The above is in keeping with the ACR TI-RADS recommendations - [HOSPITAL] [AV];[DATE].

## 2020-06-25 NOTE — Telephone Encounter (Signed)
Discussed ultrasound results as below    Estimated total number of nodules >/= 1 cm: 2  Number of spongiform nodules >/=  2 cm not described below (TR1): 0  Number of mixed cystic and solid nodules >/= 1.5 cm not described below (TR2): 0  _________________________________________________________  Nodule # 1:  Location: Right; Mid  Maximum size: 1.1 cm; Other 2 dimensions: 0.8 x 0.8 cm  Composition: solid/almost completely solid (2)  Echogenicity: isoechoic (1)  Shape: not taller-than-wide (0)  Margins: smooth (0)  Echogenic foci: none (0)  ACR TI-RADS total points: 3.  ACR TI-RADS risk category: TR3 (3 points).  ACR TI-RADS recommendations:  Given size (<1.4 cm) and appearance, this nodule does NOT meet TI-RADS criteria for biopsy or dedicated follow-up.  _________________________________________________________  Nodule # 4:  Location: Left; Inferior  Maximum size: 2.8 cm; Other 2 dimensions: 2.4 x 2.2 cm  Composition: solid/almost completely solid (2)  Echogenicity: isoechoic (1)  Shape: not taller-than-wide (0)  Margins: ill-defined (0)  Echogenic foci: none (0)  ACR TI-RADS total points: 3.  ACR TI-RADS risk category: TR3 (3 points).  ACR TI-RADS recommendations:  **Given size (>/= 2.5 cm) and appearance, fine needle aspiration of this mildly suspicious nodule should be considered based on TI-RADS criteria.  _________________________________________________________  There are a few scattered additional subcentimeter hypoechoic and mixed cystic/solid nodules, all 9 mm or less in size. These would not meet criteria for any biopsy or follow-up and are not fully described by TI rads criteria.  No hypervascularity.  No regional adenopathy.  IMPRESSION: 2.8 cm left inferior TR 3 nodule meets criteria for biopsy as above.    Will proceed with left inferior nodule FNA Pt in agreement    [place], MD  [facility] Endocrinology  [facility] [address] [place],  [address] Phone: [phone] FAX: [phone]

## 2020-07-03 ENCOUNTER — Other Ambulatory Visit: Payer: Medicare HMO

## 2020-07-18 ENCOUNTER — Other Ambulatory Visit: Payer: Self-pay

## 2020-07-18 ENCOUNTER — Ambulatory Visit
Admission: RE | Admit: 2020-07-18 | Discharge: 2020-07-18 | Disposition: A | Discharge status: Home / Self Care | Payer: Medicare HMO | Source: Ambulatory Visit | Attending: Internal Medicine | Admitting: Internal Medicine

## 2020-07-18 DIAGNOSIS — E042 Nontoxic multinodular goiter: Secondary | ICD-10-CM | POA: Insufficient documentation

## 2020-07-18 IMAGING — US US FNA BIOPSY THYROID 1ST LESION
1 series · 14 of 14 positions shown · non-contrast
Comparison: Ultrasound thyroid from [DATE]

MEDICATIONS:
Lidocaine 1% 2 mL

COMPLICATIONS:
None immediate.

INDICATION: Indeterminate thyroid nodule

EXAM:
ULTRASOUND GUIDED FINE NEEDLE ASPIRATION OF INDETERMINATE THYROID
NODULE
TECHNIQUE: Informed written consent was obtained from the patient after a
discussion of the risks, benefits and alternatives to treatment.
Questions regarding the procedure were encouraged and answered. A
timeout was performed prior to the initiation of the procedure.

[Series 1: us thyroid biopsy · 14 of 14 slices shown]
[im 1/14]
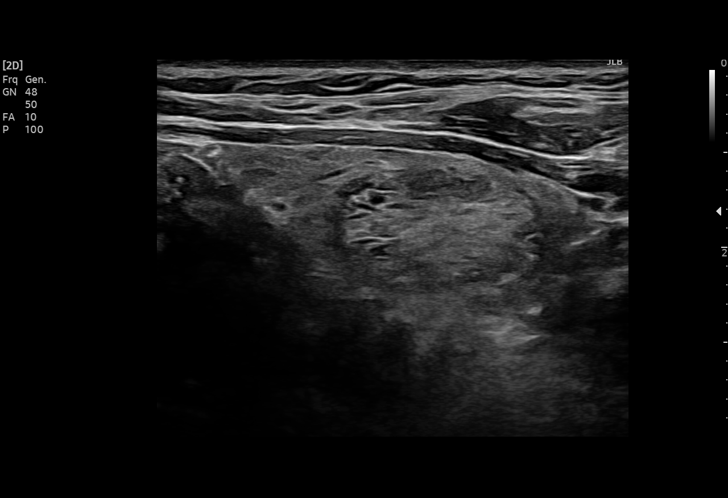
[im 2/14]
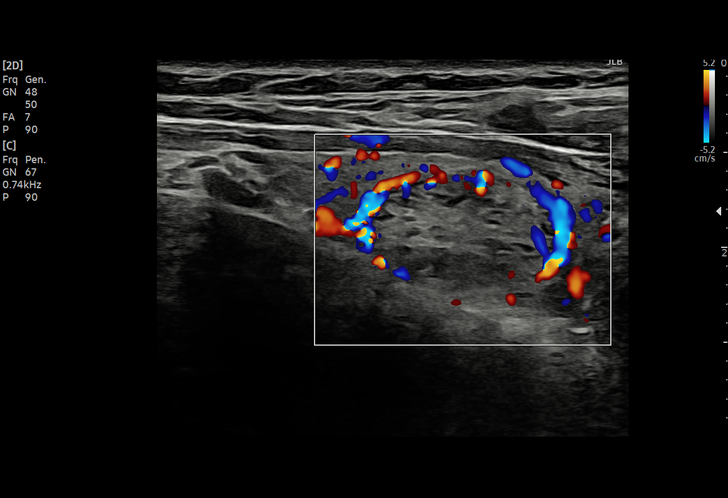
[im 3/14]
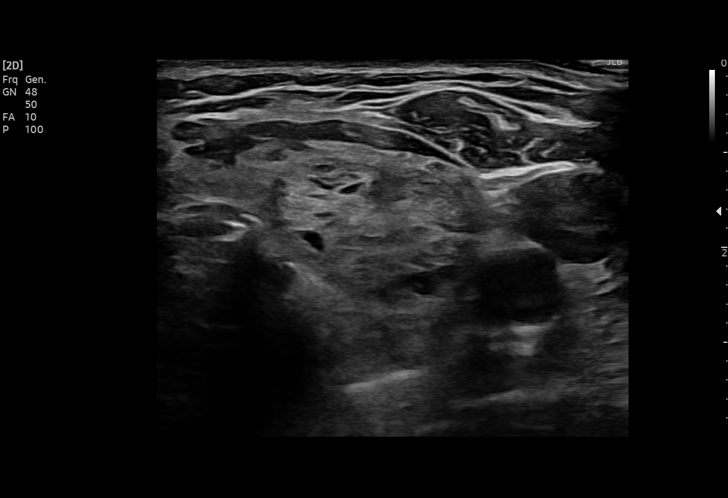
[im 4/14]
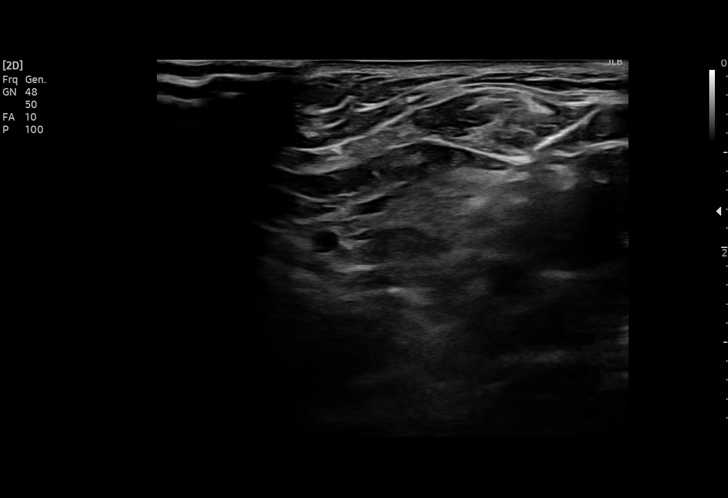
[im 5/14]
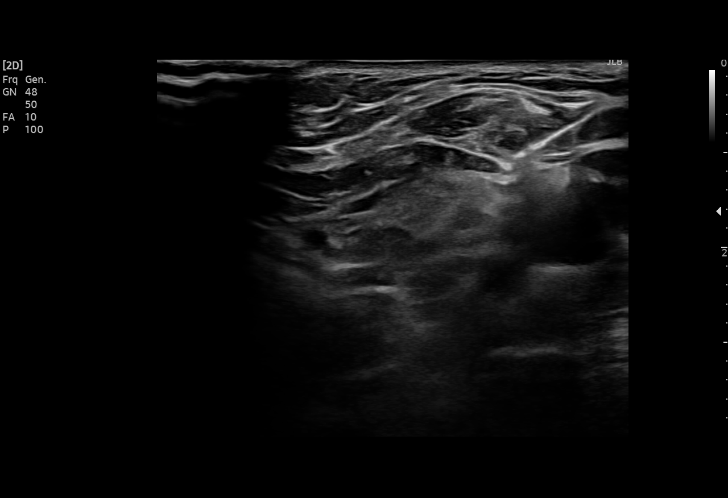
[im 6/14]
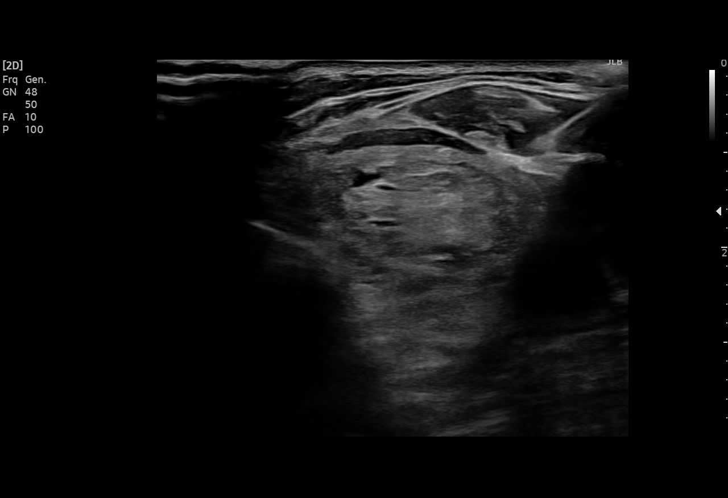
[im 7/14]
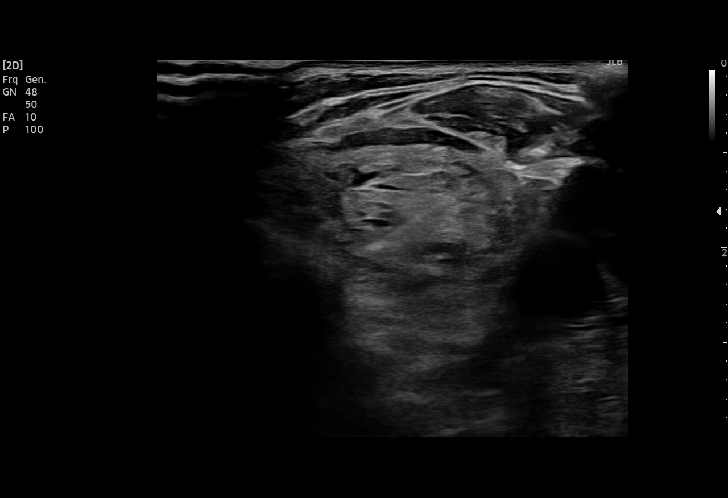
[im 8/14]
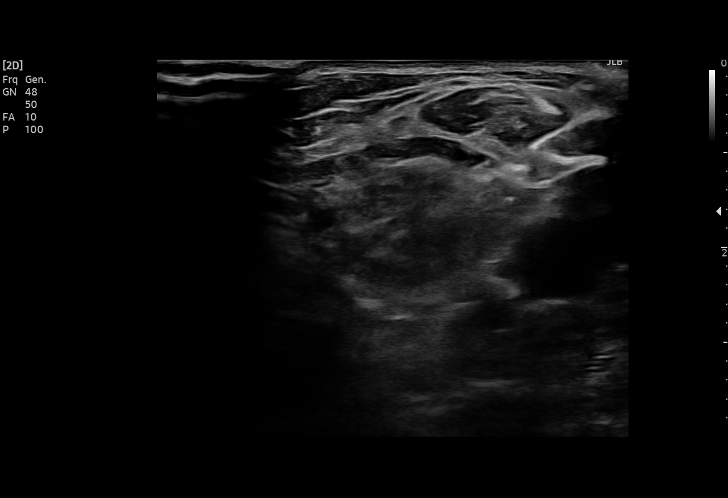
[im 9/14]
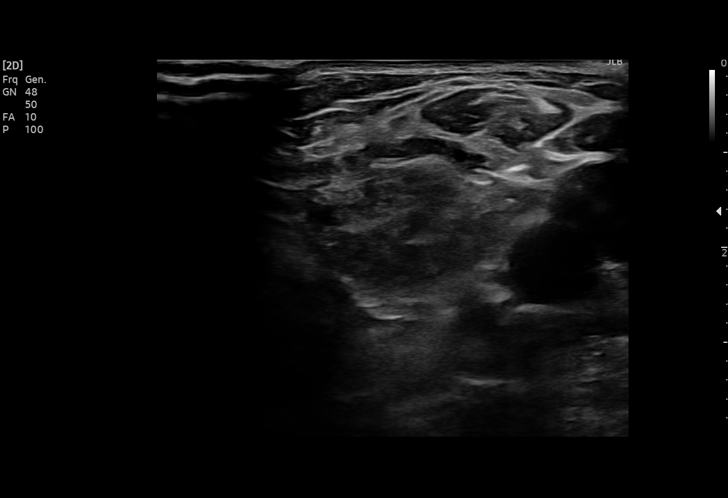
[im 10/14]
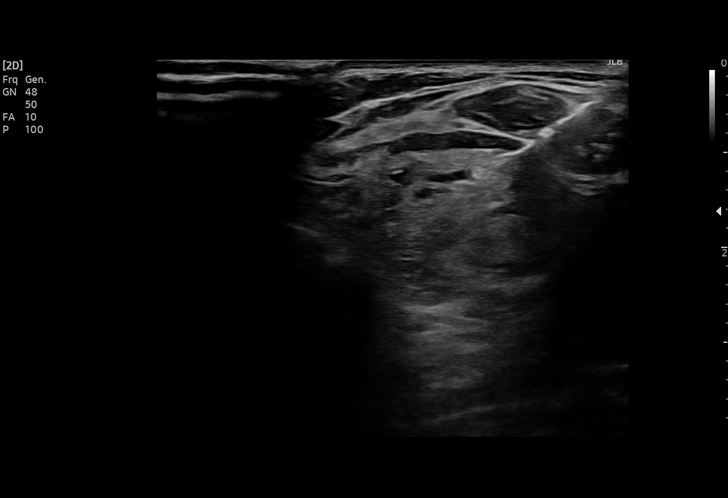
[im 11/14]
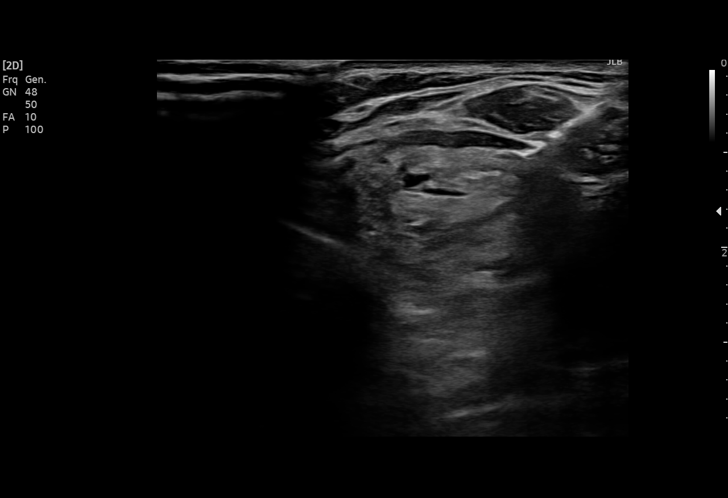
[im 12/14]
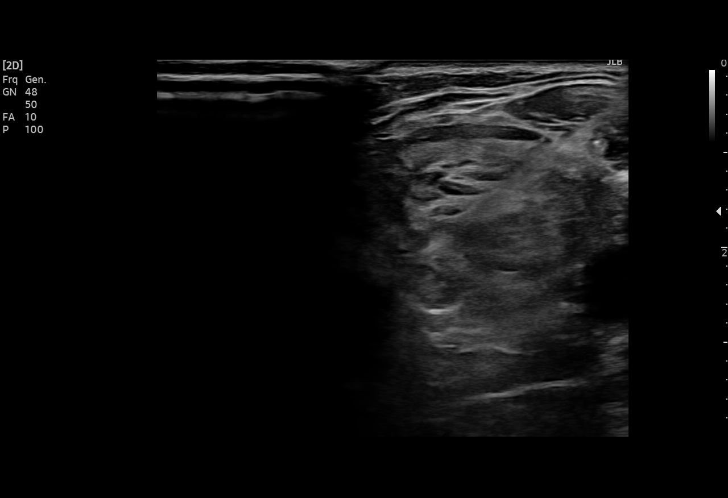
[im 13/14]
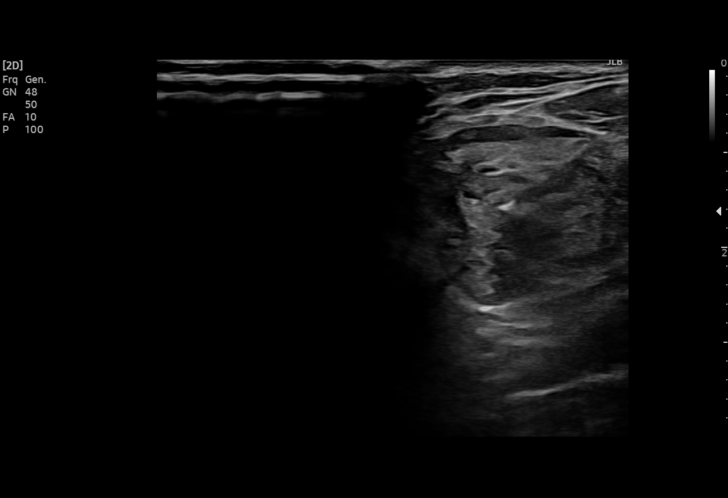
[im 14/14]
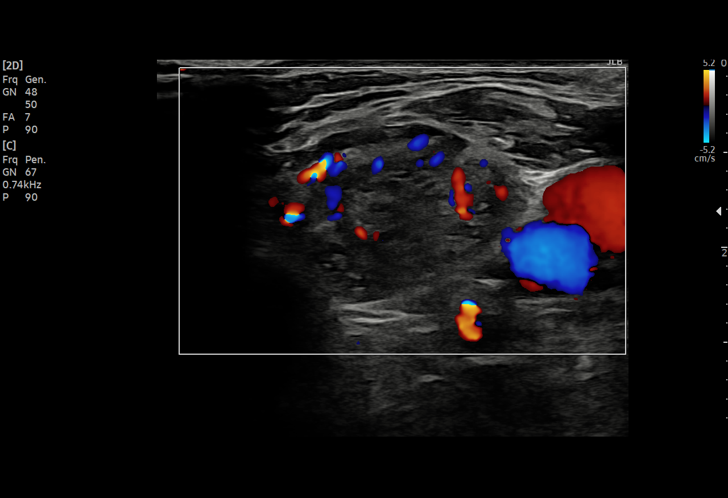

[14 of 14 positions shown; findings below may reference images not displayed]

Pre-procedural ultrasound scanning demonstrated unchanged size and
appearance of the indeterminate nodule within the left inferior
thyroid

The procedure was planned. The neck was prepped in the usual sterile
fashion, and a sterile drape was applied covering the operative
field. A timeout was performed prior to the initiation of the
procedure. Local anesthesia was provided with 1% lidocaine.

Under direct ultrasound guidance, 5 FNA biopsies were performed of
the left inferior nodule with a 25 gauge needle.

Two samples were sent to [facility] per ordering TIGER.

Multiple ultrasound images were saved for procedural documentation
purposes. The samples were prepared and submitted to pathology.

Limited post procedural scanning was negative for hematoma or
additional complication. Dressings were placed. The patient
tolerated the above procedures procedure well without immediate
postprocedural complication.
FINDINGS: Nodule reference number based on prior diagnostic ultrasound: 4

Maximum size: 2.8 cm

Location: Left; Inferior

ACR TI-RADS risk category: TR3 (3 points)

Reason for biopsy: meets ACR TI-RADS criteria

Ultrasound imaging confirms appropriate placement of the needles
within the thyroid nodule.
IMPRESSION: Technically successfUl ultrasound guided fine needle aspiration of
left inferior thyroid nodule

Read by: TIGER, NP

## 2020-07-18 NOTE — Discharge Instructions (Signed)

## 2020-07-21 LAB — CYTOLOGY - NON PAP

## 2020-07-25 ENCOUNTER — Other Ambulatory Visit: Payer: Self-pay

## 2020-07-25 ENCOUNTER — Ambulatory Visit
Admission: RE | Admit: 2020-07-25 | Discharge: 2020-07-25 | Disposition: A | Discharge status: Home / Self Care | Payer: Medicare HMO | Source: Ambulatory Visit | Attending: Internal Medicine | Admitting: Internal Medicine

## 2020-07-25 DIAGNOSIS — R918 Other nonspecific abnormal finding of lung field: Secondary | ICD-10-CM | POA: Diagnosis not present

## 2020-07-25 DIAGNOSIS — N200 Calculus of kidney: Secondary | ICD-10-CM | POA: Insufficient documentation

## 2020-07-25 DIAGNOSIS — D3500 Benign neoplasm of unspecified adrenal gland: Secondary | ICD-10-CM | POA: Diagnosis not present

## 2020-07-25 DIAGNOSIS — I700 Atherosclerosis of aorta: Secondary | ICD-10-CM | POA: Diagnosis not present

## 2020-07-25 IMAGING — CT CT ABD-PELV W/O CM
2 of 4 series · 16 of 46 positions shown, 18 images · non-contrast
Comparison: [DATE] .

CLINICAL DATA: Evaluate adrenal nodule.

EXAM:
CT ABDOMEN AND PELVIS WITHOUT CONTRAST
TECHNIQUE: Multidetector CT imaging of the abdomen and pelvis was performed
following the standard protocol without IV contrast.

[Series 2: routine abd/pel wo · axial · 0.80mm/px · z∈[-1065,-660]mm · 13 of 89 slices shown, 15 images]
[im 4/89  soft-tissue]
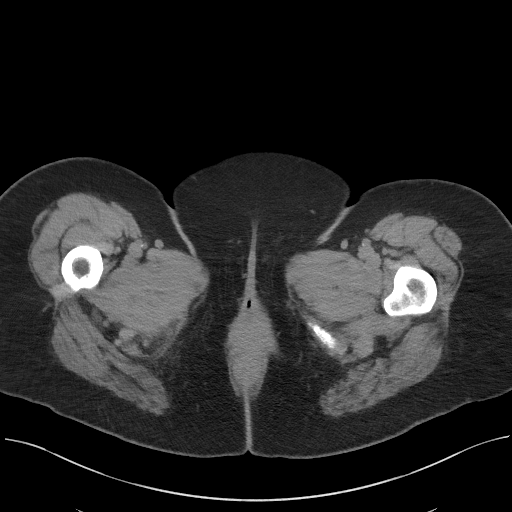
[im 4/89  bone]
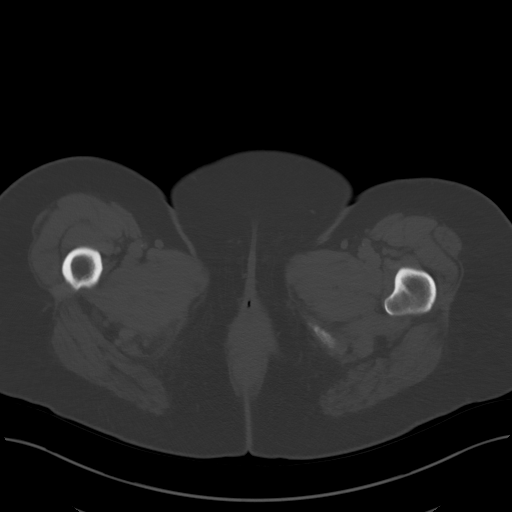
[im 12/89  soft-tissue]
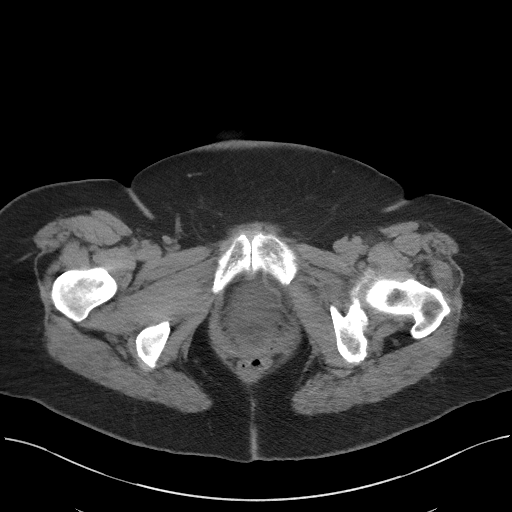
[im 19/89  soft-tissue]
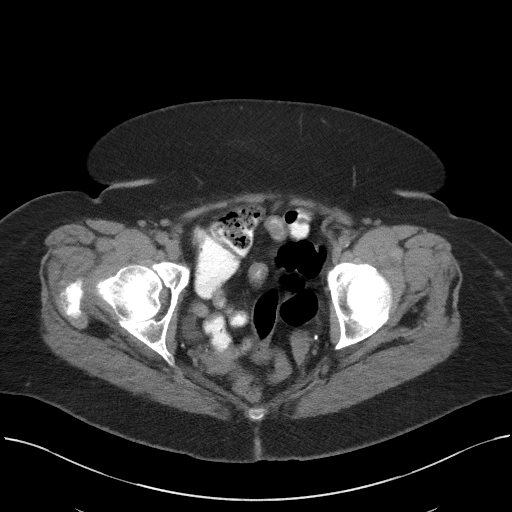
[im 26/89  soft-tissue]
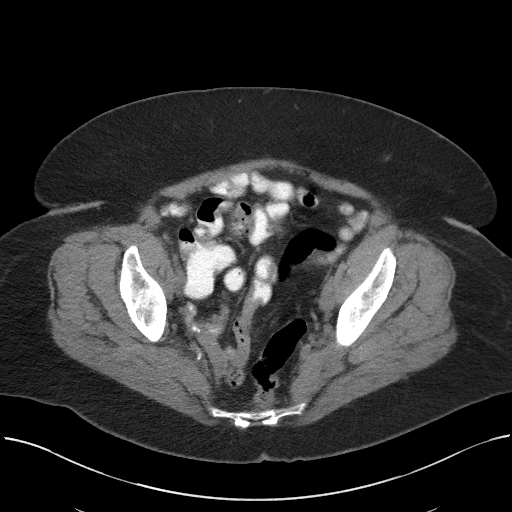
[im 30/89  soft-tissue]
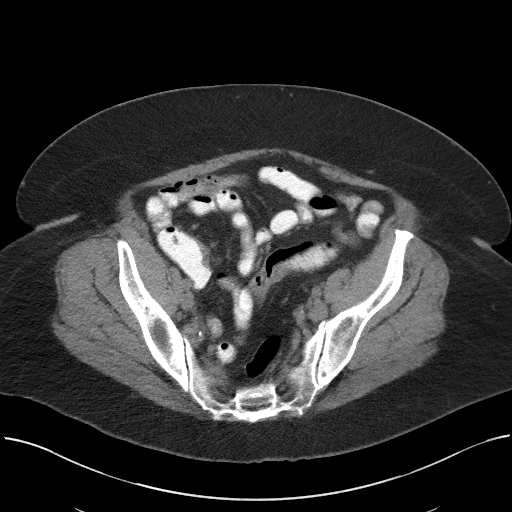
[im 37/89  soft-tissue]
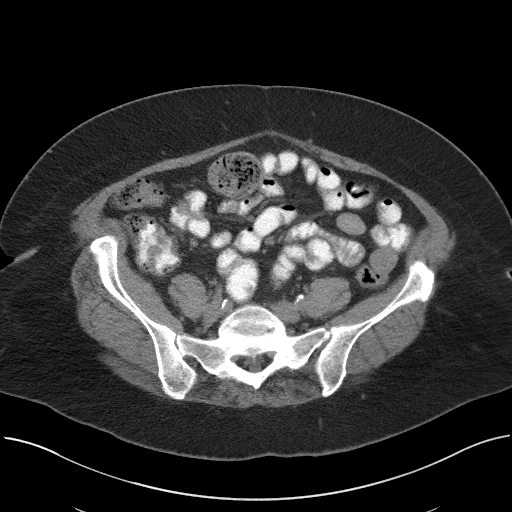
[im 45/89  soft-tissue]
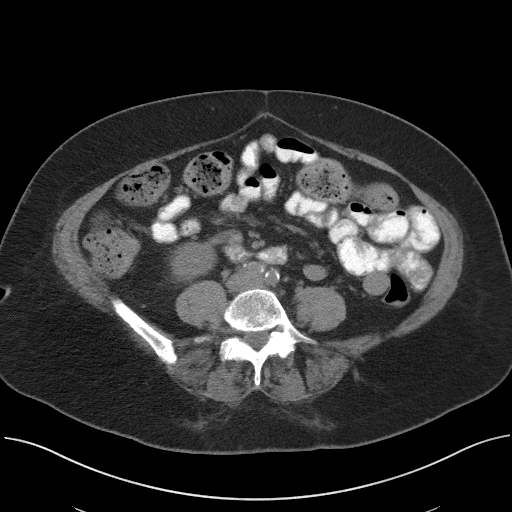
[im 52/89  soft-tissue]
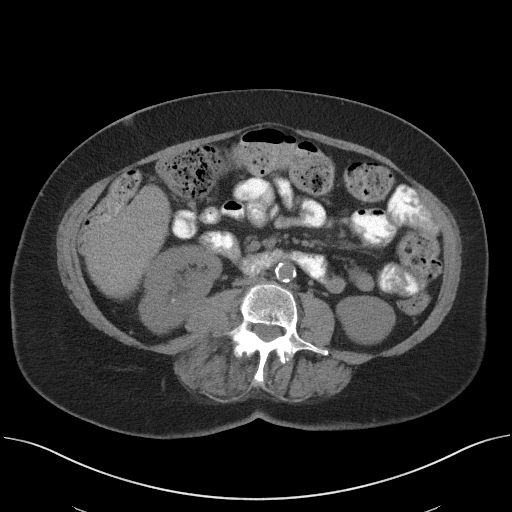
[im 59/89  soft-tissue]
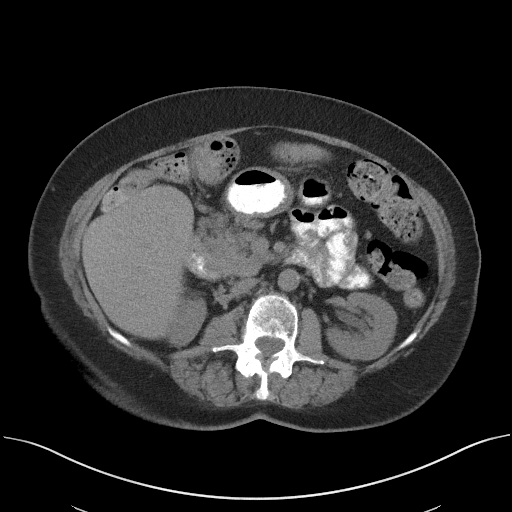
[im 59/89  bone]
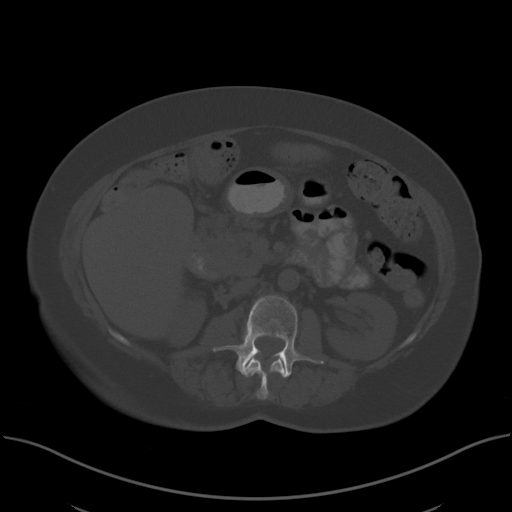
[im 63/89  soft-tissue]
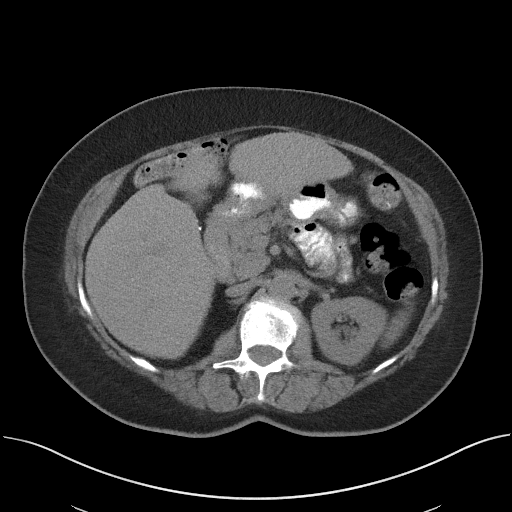
[im 70/89  soft-tissue]
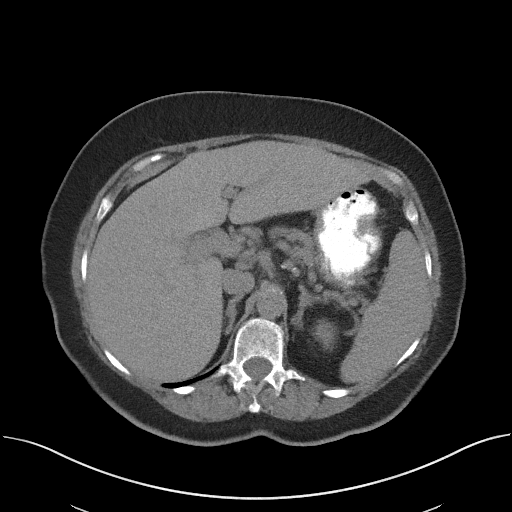
[im 78/89  soft-tissue]
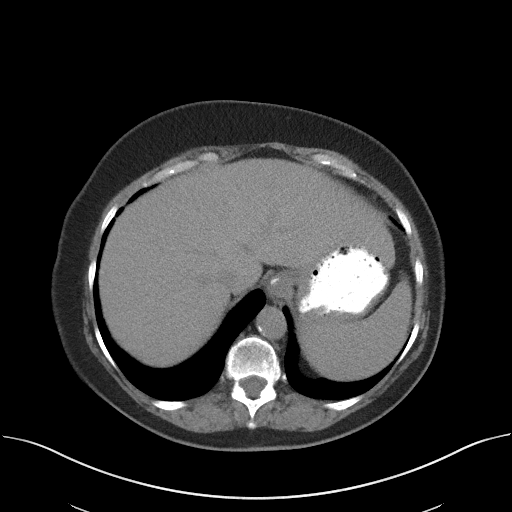
[im 85/89  soft-tissue]
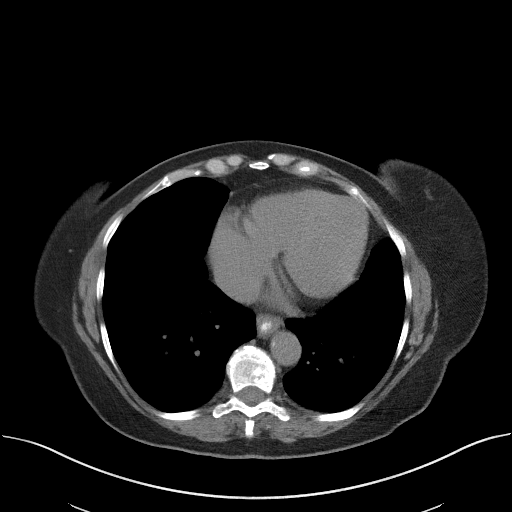

[Series 5: coronal st · coronal · 0.84mm/px · 3 of 103 slices shown]
[im 35/103  soft-tissue]
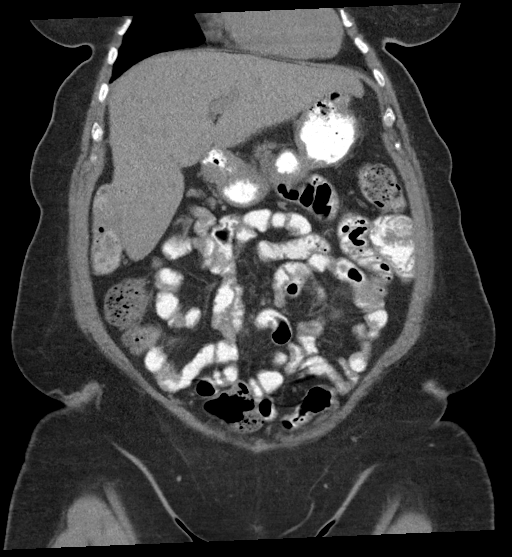
[im 46/103  soft-tissue]
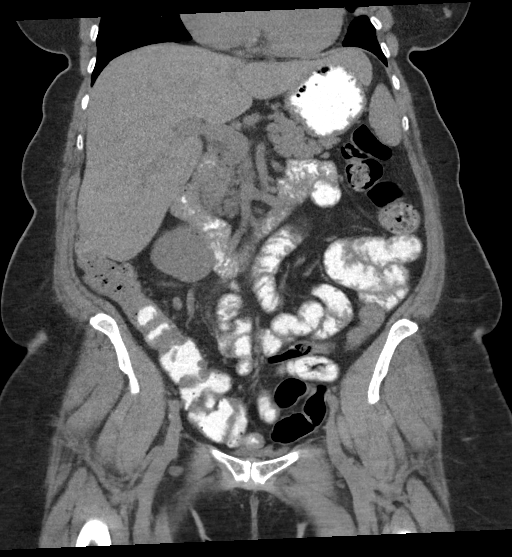
[im 57/103  soft-tissue]
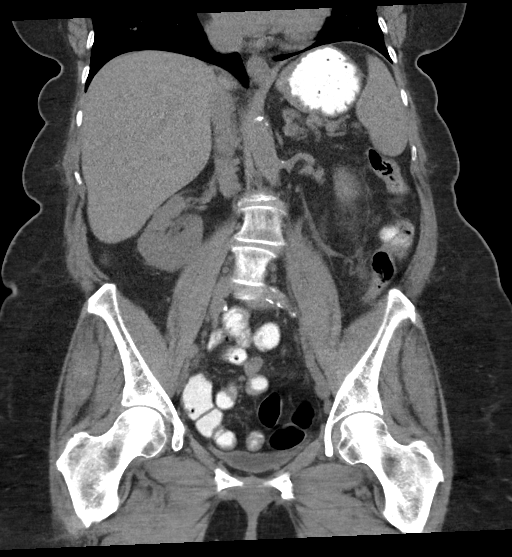

[16 of 46 positions shown; findings below may reference images not displayed]

FINDINGS: Lower chest: 4 mm nodule noted within the posterior right lower
lobe, image [DATE]. This area of the right lower lobe was not captured
on the previous exam. No acute abnormality noted.

Hepatobiliary: No focal liver abnormality is seen. Status post
cholecystectomy. No biliary dilatation.

Pancreas: Unremarkable. No pancreatic ductal dilatation or
surrounding inflammatory changes.

Spleen: Normal in size without focal abnormality.

Adrenals/Urinary Tract: The left adrenal nodule is stable measuring
1.1 x 1.4 cm, image [DATE]. Right adrenal nodule is stable measuring 7
mm, image [DATE].

Bilateral renal calculi are identified. The largest right renal
calculi is in the inferior pole measuring 5 mm. For punctate stones
are noted within the mid and inferior pole of the left kidney. No
hydronephrosis identified bilaterally. Urinary bladder is normal for
degree of distension.

Stomach/Bowel: Stomach is unremarkable. The appendix is not
confidently identified separate from the right lower quadrant bowel
loops. No dilated loops of small or large bowel.

Vascular/Lymphatic: Aortic atherosclerosis. No aneurysm. No
abdominopelvic adenopathy.

Reproductive: Status post hysterectomy. No adnexal masses.

Other: No free fluid or fluid collections.

Musculoskeletal: Mild degenerative disc disease identified within
the thoracic spine. No acute or suspicious osseous findings.
IMPRESSION: 1. Stable appearance of bilateral nodules. This is a reassuring
feature suggesting a benign abnormality. Follow-up [HOSPITAL] 12
months (from [DATE]) is recommended with adrenal protocol MRI is
recommended to ensure 12 months of stability
2. 4 mm nodule identified within the right lower lobe. This is in an
area not imaged on the previous CT of the abdomen pelvis. No
follow-up needed if patient is low-risk. Non-contrast chest CT can
be considered in 12 months if patient is high-risk. This
recommendation follows the consensus statement: Guidelines for
Management of Incidental Pulmonary Nodules Detected on CT Images:
3. Bilateral nonobstructing renal calculi.
4. Aortic atherosclerosis.

Aortic Atherosclerosis ([XE]-[XE]).

## 2020-07-28 ENCOUNTER — Encounter: Payer: Self-pay | Admitting: Internal Medicine

## 2020-07-28 DIAGNOSIS — IMO0001 Reserved for inherently not codable concepts without codable children: Secondary | ICD-10-CM

## 2020-07-28 DIAGNOSIS — C3411 Malignant neoplasm of upper lobe, right bronchus or lung: Secondary | ICD-10-CM | POA: Insufficient documentation

## 2020-08-11 ENCOUNTER — Other Ambulatory Visit: Payer: Self-pay

## 2020-08-11 ENCOUNTER — Ambulatory Visit: Payer: Medicare HMO | Admitting: Pulmonary Disease

## 2020-08-11 ENCOUNTER — Encounter: Payer: Self-pay | Admitting: Pulmonary Disease

## 2020-08-11 VITALS — BP 142/78 | HR 66 | Temp 98.2°F | Ht 64.0 in | Wt 200.0 lb

## 2020-08-11 DIAGNOSIS — Z87891 Personal history of nicotine dependence: Secondary | ICD-10-CM

## 2020-08-11 DIAGNOSIS — R911 Solitary pulmonary nodule: Secondary | ICD-10-CM | POA: Diagnosis not present

## 2020-08-11 NOTE — Patient Instructions (Signed)
Thank you for visiting Dr. Valeta Harms at [facility]. Today we recommend the following:  Orders Placed This Encounter  Procedures  . CT Chest Wo Contrast   We will call you with your CT results.   Return in about 1 year (around 08/11/2021), or if symptoms worsen or fail to improve, for with APP or Dr. Valeta Harms.    Please do your part to reduce the spread of COVID-19.

## 2020-08-11 NOTE — Progress Notes (Signed)
Synopsis: Referred in December 2021 for lung nodule by [facility], Carlyle Basques*  Subjective:   PATIENT ID: Dorothy Gross GENDER: female DOB: 1951/05/16, MRN: [id number]  Chief Complaint  Patient presents with  . Consult    Lung nodule    69 year old female past medical history of atrial fibrillation, CAD, cardiomyopathy (?viral CM ~7 years ago), type 2 diabetes, hypertension, hyperlipidemia.  Patient referred in November following a CT abdomen and pelvis on 07/25/2020 which revealed stable bilateral adrenal nodules, new 4 mm right lower lobe pulmonary nodule which recommended follow-up.  Patient has no respiratory complaints today.  She states this entire story started with a diagnosis of kidney stones in which she had adrenal nodules found.  She had follow-up imaging of the adrenal nodules which revealed the abnormality on her CT scan.  Overall she currently works for [employer].  She is worked there for 47 years.  She is a former smoker.  She is smoked on and off since that about the age of 5.  At her heaviest was a half a pack a day.  There is several years in between which she quit at the time that she was pregnant and restarted after each pregnancy.  She quit after her last child and ultimately restarted smoking after her divorce.  She finally quit smoking in 2009.   Past Medical History:  Diagnosis Date  . A-fib ([place])   . Arthritis   . CAD (coronary artery disease)   . Cardiomyopathy ([place])   . CHF (congestive heart failure) ([place])   . Diabetes mellitus without complication ([place])   . History of kidney stones   . Hyperlipidemia   . Hypertension      Family History  Problem Relation Age of Onset  . Diabetes Mother   . Diabetes Sister   . Diabetes Maternal Aunt   . Breast cancer Neg Hx      Past Surgical History:  Procedure Laterality Date  . ABDOMINAL HYSTERECTOMY    . APPENDECTOMY    . BREAST EXCISIONAL BIOPSY Right [id number]   negative 1974 negative 2015   . CHOLECYSTECTOMY N/A 12/05/2015   Procedure: LAPAROSCOPIC CHOLECYSTECTOMY WITH INTRAOPERATIVE CHOLANGIOGRAM;  Surgeon: Leonie Green, MD;  Location: [facility] ORS;  Service: General;  Laterality: N/A;  . COLONOSCOPY WITH PROPOFOL N/A 10/18/2016   Procedure: COLONOSCOPY WITH PROPOFOL;  Surgeon: Manya Silvas, MD;  Location: [facility] ENDOSCOPY;  Service: Endoscopy;  Laterality: N/A;  . COLONOSCOPY WITH PROPOFOL N/A 03/17/2018   Procedure: COLONOSCOPY WITH PROPOFOL;  Surgeon: Manya Silvas, MD;  Location: [facility] ENDOSCOPY;  Service: Endoscopy;  Laterality: N/A;  . CYSTOSCOPY W/ RETROGRADES Bilateral 03/07/2020   Procedure: CYSTOSCOPY WITH RETROGRADE PYELOGRAM;  Surgeon: Billey Co, MD;  Location: [facility] ORS;  Service: Urology;  Laterality: Bilateral;  . CYSTOSCOPY/URETEROSCOPY/HOLMIUM LASER/STENT PLACEMENT Bilateral 03/07/2020   Procedure: CYSTOSCOPY/URETEROSCOPY/HOLMIUM LASER/STENT PLACEMENT;  Surgeon: Billey Co, MD;  Location: [facility] ORS;  Service: Urology;  Laterality: Bilateral;  . JOINT REPLACEMENT     both knees  . REPLACEMENT TOTAL KNEE BILATERAL    . TONSILLECTOMY      Social History   Socioeconomic History  . Marital status: Divorced    Spouse name: Not on file  . Number of children: Not on file  . Years of education: Not on file  . Highest education level: Not on file  Occupational History  . Not on file  Tobacco Use  . Smoking status: Former Smoker    Packs/day: 0.25  Years: 15.00    Pack years: 3.75    Types: Cigarettes    Quit date: 08/31/2007    Years since quitting: 12.9  . Smokeless tobacco: Never Used  Vaping Use  . Vaping Use: Never used  Substance and Sexual Activity  . Alcohol use: No    Alcohol/week: 0.0 standard drinks  . Drug use: No  . Sexual activity: Not on file  Other Topics Concern  . Not on file  Social History Narrative  . Not on file   Social Determinants of Health   Financial Resource Strain: Not on file  Food Insecurity: Not on  file  Transportation Needs: Not on file  Physical Activity: Not on file  Stress: Not on file  Social Connections: Not on file  Intimate Partner Violence: Not on file     No Known Allergies   Outpatient Medications Prior to Visit  Medication Sig Dispense Refill  . amiodarone (PACERONE) 200 MG tablet Take 200 mg by mouth daily.     Marland Kitchen apixaban (ELIQUIS) 5 MG TABS tablet Take 5 mg by mouth 2 (two) times daily.     . Biotin 1000 MCG tablet Take 1,000 mcg by mouth daily.    . calcium carbonate (OS-CAL - DOSED IN MG OF ELEMENTAL CALCIUM) 1250 (500 Ca) MG tablet Take 2 tablets by mouth daily with breakfast.     . carvedilol (COREG) 6.25 MG tablet Take 6.25 mg by mouth 2 (two) times daily with a meal.     . Cholecalciferol (VITAMIN D3) 1000 units CAPS Take 1,000 Units by mouth daily.     . cyanocobalamin 1000 MCG tablet Take 1,000 mcg by mouth daily.     . digoxin (LANOXIN) 0.125 MG tablet Take 0.125 mg by mouth daily.     . ferrous sulfate 325 (65 FE) MG tablet Take 325 mg by mouth daily with breakfast.     . FIBER SELECT GUMMIES PO Take 2 tablets by mouth daily.    . Flaxseed, Linseed, (FLAX SEED OIL PO) Take 2 capsules by mouth daily.    . fluticasone (FLONASE) 50 MCG/ACT nasal spray Place 1 spray into both nostrils daily as needed for allergies.     . furosemide (LASIX) 40 MG tablet Take 40 mg by mouth daily.     Marland Kitchen glucose blood (ONETOUCH ULTRA) test strip USE 1 EACH (1 STRIP TOTAL) 3 (THREE) TIMES DAILY    . Lancets MISC     . losartan (COZAAR) 50 MG tablet Take 50 mg by mouth daily.     . metFORMIN (GLUCOPHAGE-XR) 500 MG 24 hr tablet Take 1,000 mg by mouth every evening.     . Multiple Vitamins-Minerals (MULTIVITAMIN ADULT PO) Take 1 tablet by mouth daily.    . rosuvastatin (CRESTOR) 10 MG tablet Take 10 mg by mouth daily.     Marland Kitchen zinc gluconate 50 MG tablet Take 50 mg by mouth daily.      No facility-administered medications prior to visit.    Review of Systems  Constitutional:  Negative for chills, fever, malaise/fatigue and weight loss.  HENT: Negative for hearing loss, sore throat and tinnitus.   Eyes: Negative for blurred vision and double vision.  Respiratory: Negative for cough, hemoptysis, sputum production, shortness of breath, wheezing and stridor.   Cardiovascular: Negative for chest pain, palpitations, orthopnea, leg swelling and PND.  Gastrointestinal: Negative for abdominal pain, constipation, diarrhea, heartburn, nausea and vomiting.  Genitourinary: Negative for dysuria, hematuria and urgency.  Musculoskeletal: Negative for joint  pain and myalgias.  Skin: Negative for itching and rash.  Neurological: Negative for dizziness, tingling, weakness and headaches.  Endo/Heme/Allergies: Negative for environmental allergies. Does not bruise/bleed easily.  Psychiatric/Behavioral: Negative for depression. The patient is not nervous/anxious and does not have insomnia.   All other systems reviewed and are negative.    Objective:  Physical Exam Vitals reviewed.  Constitutional:      General: She is not in acute distress.    Appearance: She is well-developed and well-nourished. She is obese.  HENT:     Head: Normocephalic and atraumatic.     Mouth/Throat:     Mouth: Oropharynx is clear and moist.  Eyes:     General: No scleral icterus.    Conjunctiva/sclera: Conjunctivae normal.     Pupils: Pupils are equal, round, and reactive to light.  Neck:     Vascular: No JVD.     Trachea: No tracheal deviation.  Cardiovascular:     Rate and Rhythm: Normal rate and regular rhythm.     Pulses: Intact distal pulses.     Heart sounds: Normal heart sounds. No murmur heard.   Pulmonary:     Effort: Pulmonary effort is normal. No tachypnea, accessory muscle usage or respiratory distress.     Breath sounds: Normal breath sounds. No stridor. No wheezing, rhonchi or rales.  Abdominal:     General: Bowel sounds are normal. There is no distension.     Palpations:  Abdomen is soft.     Tenderness: There is no abdominal tenderness.  Musculoskeletal:        General: No tenderness or edema.     Cervical back: Neck supple.  Lymphadenopathy:     Cervical: No cervical adenopathy.  Skin:    General: Skin is warm and dry.     Capillary Refill: Capillary refill takes less than 2 seconds.     Findings: No rash.  Neurological:     Mental Status: She is alert and oriented to person, place, and time.  Psychiatric:        Mood and Affect: Mood and affect normal.        Behavior: Behavior normal.      Vitals:   08/11/20 1449  BP: (!) 142/78  Pulse: 66  Temp: 98.2 F (36.8 C)  TempSrc: Tympanic  SpO2: 97%  Weight: 200 lb (90.7 kg)  Height: 5\' 4"  (1.626 m)   97% on RA BMI Readings from Last 3 Encounters:  08/11/20 34.33 kg/m  05/26/20 34.43 kg/m  05/21/20 34.33 kg/m   Wt Readings from Last 3 Encounters:  08/11/20 200 lb (90.7 kg)  05/26/20 200 lb 9.6 oz (91 kg)  05/21/20 200 lb (90.7 kg)     CBC    Component Value Date/Time   WBC 8.8 03/05/2020 0827   RBC 4.88 03/05/2020 0827   HGB 13.6 03/05/2020 0827   HGB 14.4 02/10/2013 0555   HCT 42.3 03/05/2020 0827   HCT 43.8 02/10/2013 0555   PLT 298 03/05/2020 0827   PLT 345 02/10/2013 0555   MCV 86.7 03/05/2020 0827   MCV 83 02/10/2013 0555   MCH 27.9 03/05/2020 0827   MCHC 32.2 03/05/2020 0827   RDW 15.6 (H) 03/05/2020 0827   RDW 14.5 02/10/2013 0555   LYMPHSABS 3.2 02/10/2013 0555   MONOABS 0.8 02/10/2013 0555   EOSABS 0.1 02/10/2013 0555   BASOSABS 0.1 02/10/2013 0555     Chest Imaging: CT Abd Pelvis Small 53mm RLL pulmonary nodule The patient's images have  been independently reviewed by me.    Pulmonary Functions Testing Results: No flowsheet data found.  FeNO:   Pathology:   Echocardiogram:   Heart Catheterization:     Assessment & Plan:     ICD-10-CM   1. Pulmonary nodule  R91.1   2. Former smoker  Z87.891     Discussion:  This is a 69 year old female  here for evaluation of a incidentally found right lower lobe 4 mm pulmonary nodule.  She has no respiratory complaints today.  The nodule was found incidentally on follow-up imaging for bilateral adrenal nodules.  She is however a former smoker.  She does not qualify for lung cancer screening as she does not have a greater than 20-pack-year history.  She did quit smoking in 2009 which is within the 15-year window of cessation.  But her pack-year history is too low.  Plan: We will recommend a noncontrasted CT of the chest for further evaluation of the upper lobes. Likely if there are no additional nodules seen she will need a repeat noncontrasted CT of the chest in 1 year. We discussed the risks of lung nodules developing into malignancy. Patient is agreeable to proceed with this plan.  Again counseled on continued smoking cessation  We will call patient with CT scan results.  Likely end up ordering a repeat noncontrasted CT of the chest in 1 year following image results.  Patient to follow-up in clinic in 1 year or as needed based on symptoms.   Current Outpatient Medications:  .  amiodarone (PACERONE) 200 MG tablet, Take 200 mg by mouth daily. , Disp: , Rfl:  .  apixaban (ELIQUIS) 5 MG TABS tablet, Take 5 mg by mouth 2 (two) times daily. , Disp: , Rfl:  .  Biotin 1000 MCG tablet, Take 1,000 mcg by mouth daily., Disp: , Rfl:  .  calcium carbonate (OS-CAL - DOSED IN MG OF ELEMENTAL CALCIUM) 1250 (500 Ca) MG tablet, Take 2 tablets by mouth daily with breakfast. , Disp: , Rfl:  .  carvedilol (COREG) 6.25 MG tablet, Take 6.25 mg by mouth 2 (two) times daily with a meal. , Disp: , Rfl:  .  Cholecalciferol (VITAMIN D3) 1000 units CAPS, Take 1,000 Units by mouth daily. , Disp: , Rfl:  .  cyanocobalamin 1000 MCG tablet, Take 1,000 mcg by mouth daily. , Disp: , Rfl:  .  digoxin (LANOXIN) 0.125 MG tablet, Take 0.125 mg by mouth daily. , Disp: , Rfl:  .  ferrous sulfate 325 (65 FE) MG tablet, Take 325  mg by mouth daily with breakfast. , Disp: , Rfl:  .  FIBER SELECT GUMMIES PO, Take 2 tablets by mouth daily., Disp: , Rfl:  .  Flaxseed, Linseed, (FLAX SEED OIL PO), Take 2 capsules by mouth daily., Disp: , Rfl:  .  fluticasone (FLONASE) 50 MCG/ACT nasal spray, Place 1 spray into both nostrils daily as needed for allergies. , Disp: , Rfl:  .  furosemide (LASIX) 40 MG tablet, Take 40 mg by mouth daily. , Disp: , Rfl:  .  glucose blood (ONETOUCH ULTRA) test strip, USE 1 EACH (1 STRIP TOTAL) 3 (THREE) TIMES DAILY, Disp: , Rfl:  .  Lancets MISC, , Disp: , Rfl:  .  losartan (COZAAR) 50 MG tablet, Take 50 mg by mouth daily. , Disp: , Rfl:  .  metFORMIN (GLUCOPHAGE-XR) 500 MG 24 hr tablet, Take 1,000 mg by mouth every evening. , Disp: , Rfl:  .  Multiple Vitamins-Minerals (MULTIVITAMIN ADULT  PO), Take 1 tablet by mouth daily., Disp: , Rfl:  .  rosuvastatin (CRESTOR) 10 MG tablet, Take 10 mg by mouth daily. , Disp: , Rfl:  .  zinc gluconate 50 MG tablet, Take 50 mg by mouth daily. , Disp: , Rfl:   I spent 45 minutes dedicated to the care of this patient on the date of this encounter to include pre-visit review of records, face-to-face time with the patient discussing conditions above, post visit ordering of testing, clinical documentation with the electronic health record, making appropriate referrals as documented, and communicating necessary findings to members of the patients care team.   Garner Nash, DO [facility] 08/11/2020 3:06 PM

## 2020-08-26 ENCOUNTER — Ambulatory Visit: Payer: Medicare HMO

## 2020-09-02 ENCOUNTER — Ambulatory Visit: Payer: Medicare HMO

## 2020-09-15 ENCOUNTER — Ambulatory Visit: Payer: Medicare HMO

## 2020-09-25 ENCOUNTER — Ambulatory Visit
Admission: RE | Admit: 2020-09-25 | Discharge: 2020-09-25 | Disposition: A | Discharge status: Home / Self Care | Payer: Medicare HMO | Source: Ambulatory Visit | Attending: Pulmonary Disease | Admitting: Pulmonary Disease

## 2020-09-25 ENCOUNTER — Other Ambulatory Visit: Payer: Self-pay

## 2020-09-25 DIAGNOSIS — Z87891 Personal history of nicotine dependence: Secondary | ICD-10-CM | POA: Diagnosis present

## 2020-09-25 DIAGNOSIS — R911 Solitary pulmonary nodule: Secondary | ICD-10-CM | POA: Insufficient documentation

## 2020-09-25 IMAGING — CT CT CHEST W/O CM
2 of 4 series · 15 of 36 positions shown, 18 images · non-contrast
Comparison: CT abdomen pelvis dated [DATE].

CLINICAL DATA: 69-year-old female with lung nodule.

EXAM:
CT CHEST WITHOUT CONTRAST
TECHNIQUE: Multidetector CT imaging of the chest was performed following the
standard protocol without IV contrast.

[Series 2: chest 2.00 · axial · 0.76mm/px · z∈[-1143,-889]mm · 12 of 151 slices shown, 15 images]
[im 12/151  mediastinal]
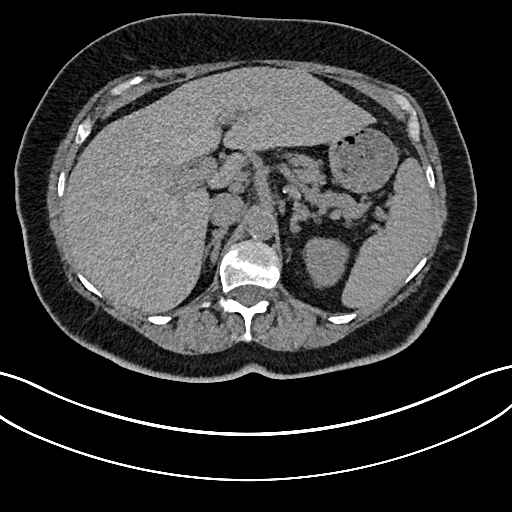
[im 12/151  lung]
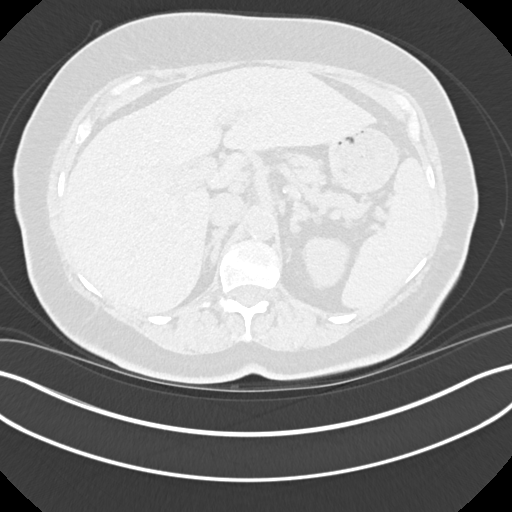
[im 24/151  lung]
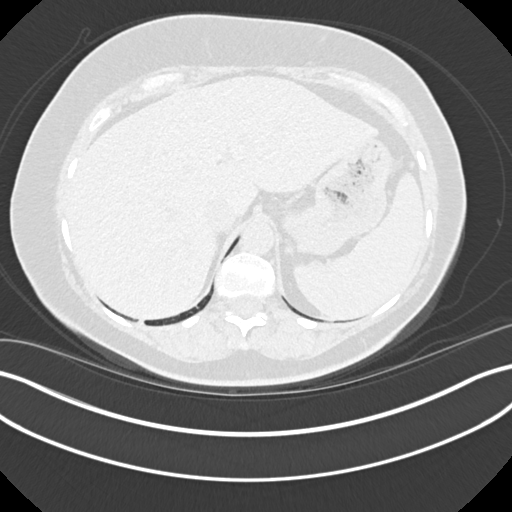
[im 35/151  lung]
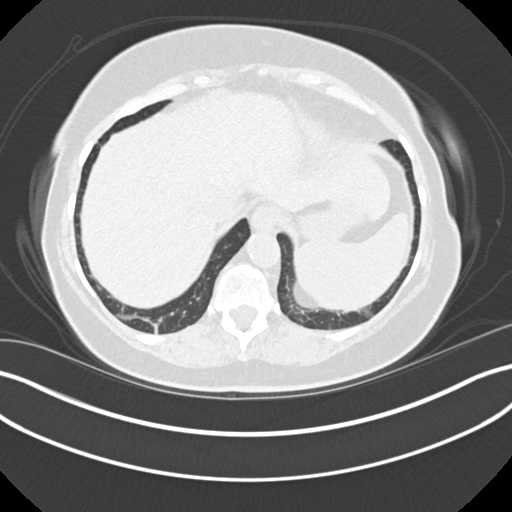
[im 47/151  lung]
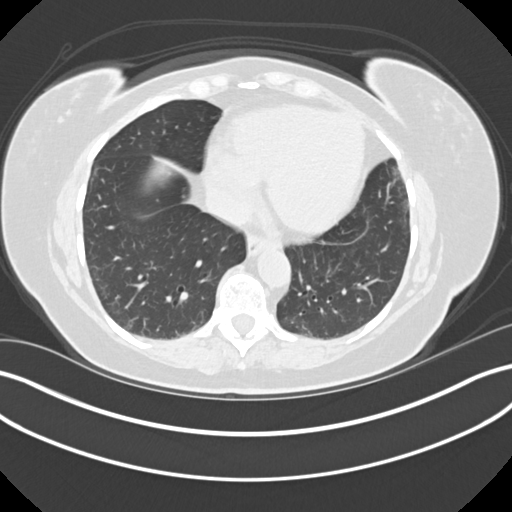
[im 58/151  mediastinal]
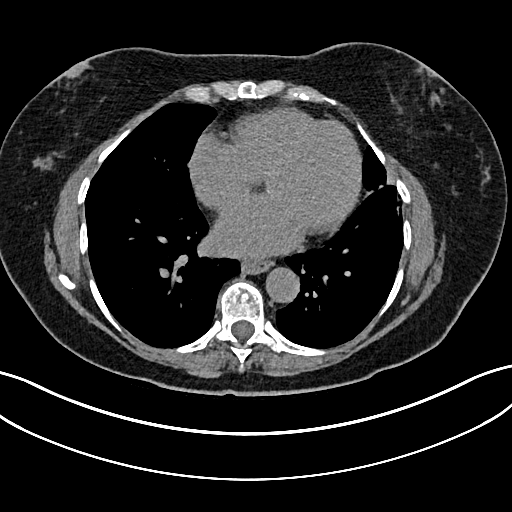
[im 58/151  lung]
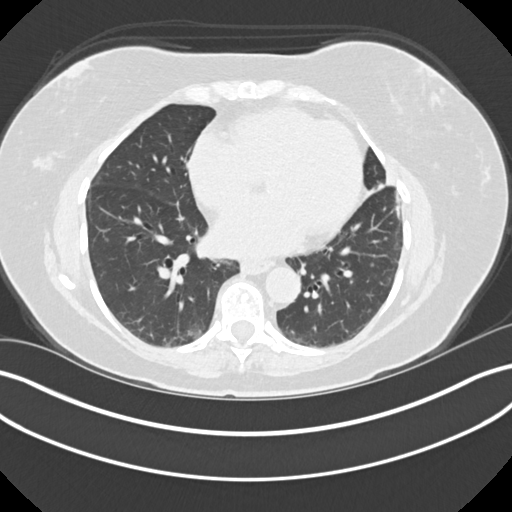
[im 70/151  lung]
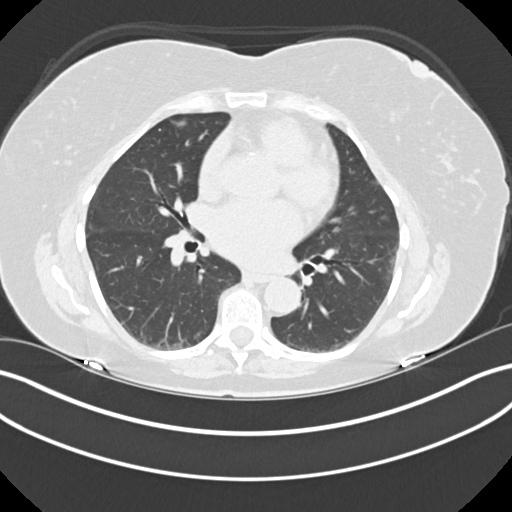
[im 81/151  lung]
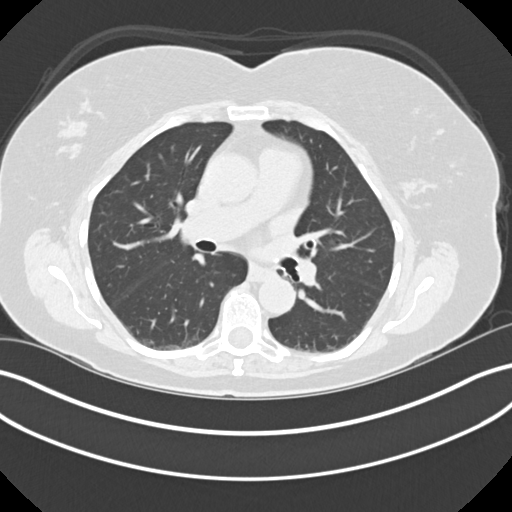
[im 93/151  lung]
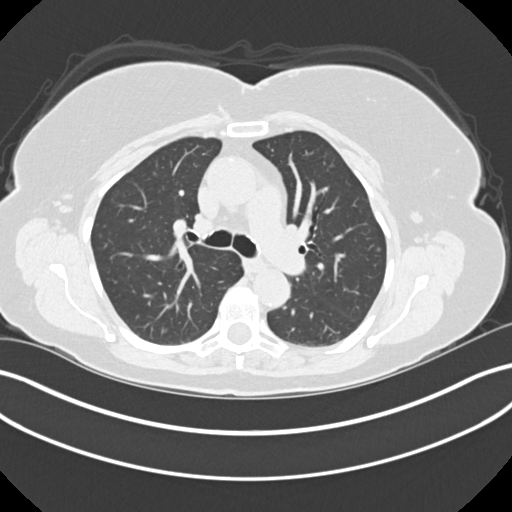
[im 104/151  mediastinal]
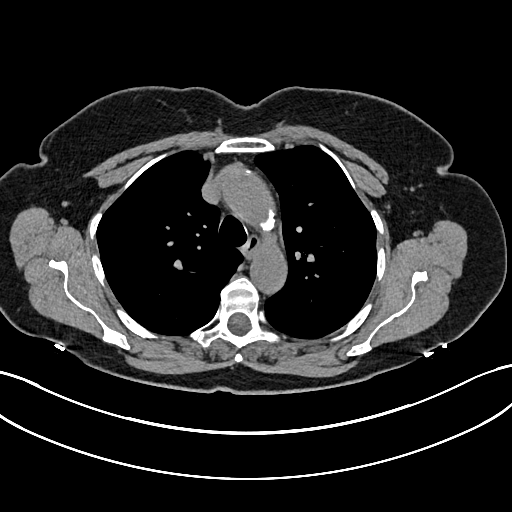
[im 104/151  lung]
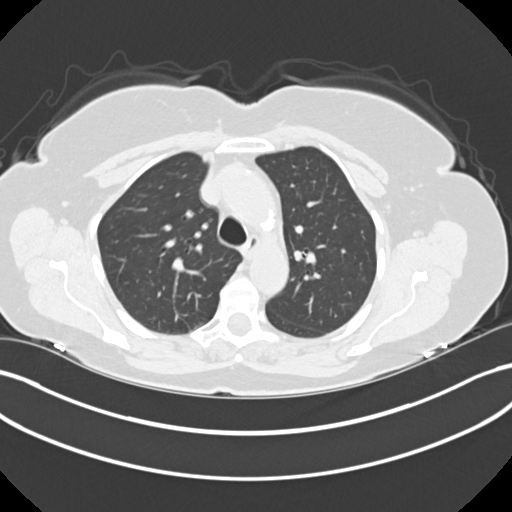
[im 116/151  lung]
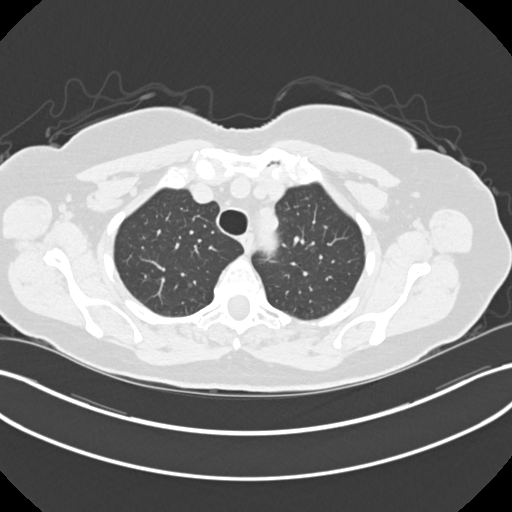
[im 127/151  lung]
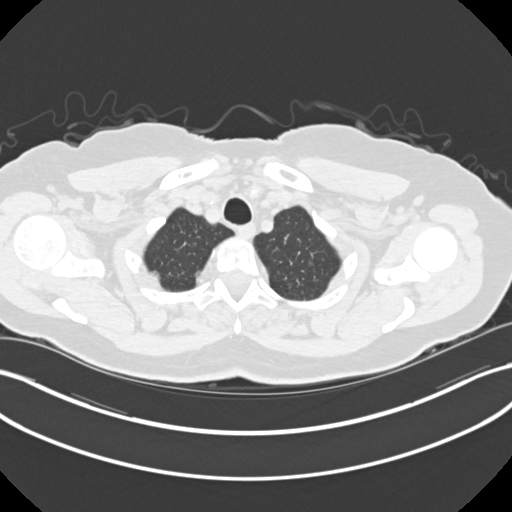
[im 139/151  lung]
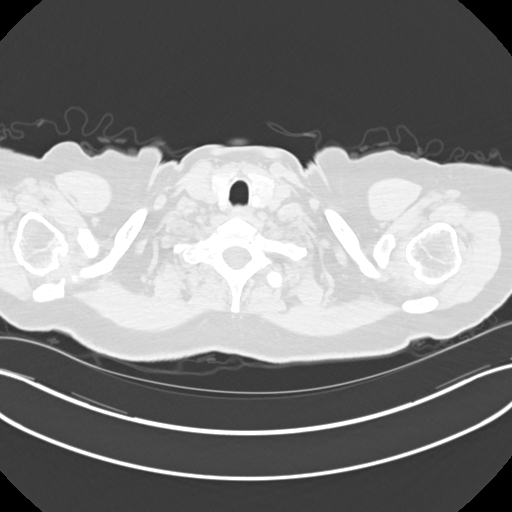

[Series 5: coronals chest 2.00 cor · coronal · 0.59mm/px · 3 of 156 slices shown]
[im 32/156  lung]
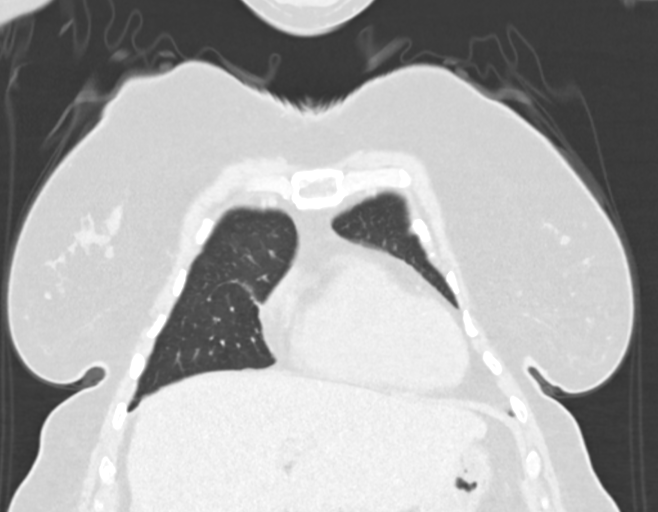
[im 63/156  lung]
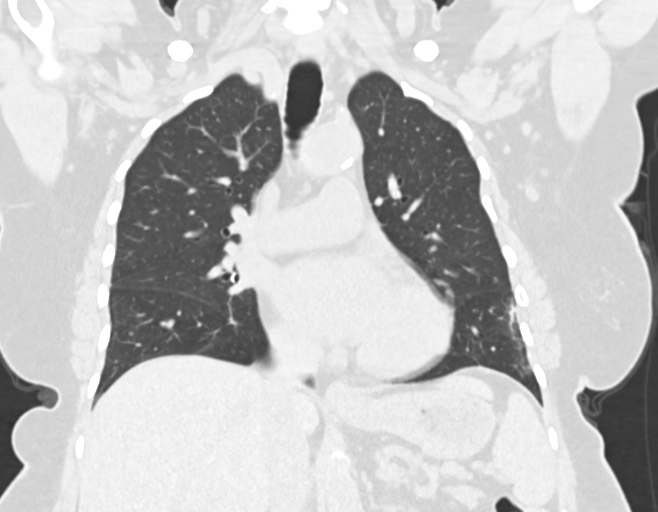
[im 94/156  lung]
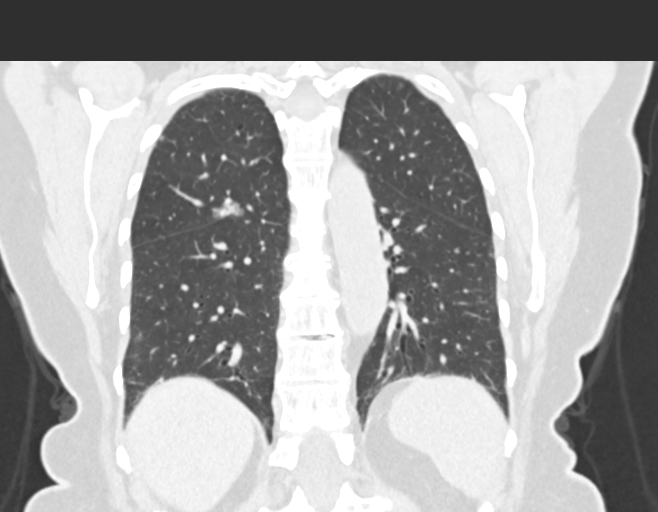

[15 of 36 positions shown; findings below may reference images not displayed]

FINDINGS: Evaluation of this exam is limited in the absence of intravenous
contrast.

Cardiovascular: There is no cardiomegaly or pericardial effusion.
Coronary vascular calcification primarily involving the LAD. Mild
atherosclerotic calcification of the thoracic aorta. No aneurysmal
dilatation. The central pulmonary arteries are grossly unremarkable
on this noncontrast CT.

Mediastinum/Nodes: No hilar or mediastinal adenopathy. The esophagus
is grossly unremarkable. There is heterogeneity of the thyroid
gland. There is enlargement of the left thyroid lobe with a 2.8 cm
hypoenhancing nodule. Recommend thyroid US (ref: [HOSPITAL].
[DATE]): 143-50). No mediastinal fluid collection.

Lungs/Pleura: There is a 16 mm ground-glass nodule in the right
upper lobe along the fissure (63/3). Several additional smaller
scattered ground-glass nodules predominantly involving the upper
lobes. Findings may represent atypical infection. Malignancy is not
excluded. Clinical correlation and close follow-up with CT after
treatment and resolution of acute symptoms recommended. There is no
pleural effusion pneumothorax. The central airways are patent.

Upper Abdomen: Bilateral adrenal nodules as seen on the prior CT of
the abdomen pelvis.

Musculoskeletal: Degenerative changes of the spine. No acute osseous
pathology.
IMPRESSION: 1. Bilateral ground-glass pulmonary nodules as described. Findings
may represent atypical infection or malignancy. Clinical correlation
and close follow-up recommended.
2. A 2.8 cm left thyroid nodule. Further evaluation with ultrasound
recommended.
3. Bilateral adrenal nodules as seen on the prior CT of the abdomen
pelvis.
4. Aortic Atherosclerosis ([L3]-[L3]).

## 2020-10-13 ENCOUNTER — Ambulatory Visit (INDEPENDENT_AMBULATORY_CARE_PROVIDER_SITE_OTHER): Payer: Medicare HMO | Admitting: Pulmonary Disease

## 2020-10-13 ENCOUNTER — Other Ambulatory Visit: Payer: Self-pay

## 2020-10-13 DIAGNOSIS — R918 Other nonspecific abnormal finding of lung field: Secondary | ICD-10-CM | POA: Diagnosis not present

## 2020-10-13 DIAGNOSIS — R911 Solitary pulmonary nodule: Secondary | ICD-10-CM

## 2020-10-13 DIAGNOSIS — Z87891 Personal history of nicotine dependence: Secondary | ICD-10-CM | POA: Diagnosis not present

## 2020-10-13 NOTE — Progress Notes (Signed)
Virtual Visit via Telephone Note  I connected with Dorothy Gross on 10/13/20 at  9:00 AM EST by telephone and verified that I am speaking with the correct person using two identifiers.  Location: Patient: Home  Provider: Office    I discussed the limitations, risks, security and privacy concerns of performing an evaluation and management service by telephone and the availability of in person appointments. I also discussed with the patient that there may be a patient responsible charge related to this service. The patient expressed understanding and agreed to proceed.  History of Present Illness:  70 year old female past medical history of atrial fibrillation, CAD, cardiomyopathy (?viral CM ~7 years ago), type 2 diabetes, hypertension, hyperlipidemia.  Patient referred in November following a CT abdomen and pelvis on 07/25/2020 which revealed stable bilateral adrenal nodules, new 4 mm right lower lobe pulmonary nodule which recommended follow-up.  Patient has no respiratory complaints today.  She states this entire story started with a diagnosis of kidney stones in which she had adrenal nodules found.  She had follow-up imaging of the adrenal nodules which revealed the abnormality on her CT scan.  Overall she currently works for [employer].  She is worked there for 47 years.  She is a former smoker.  She is smoked on and off since that about the age of 26.  At her heaviest was a half a pack a day.  There is several years in between which she quit at the time that she was pregnant and restarted after each pregnancy.  She quit after her last child and ultimately restarted smoking after her divorce.  She finally quit smoking in 2009.   OV 10/13/2020: Here today to discuss via telephone the CT scan results that were completed on 09/25/2020.  We discussed her thyroid nodule that was also found.  She follows with endocrinology and this was biopsied this past fall.  She has a 53mm groundglass nodule  that is along the fissure line as well as other smaller upper lobe groundglass nodules.  She denies any recent sick contacts or Covid exposures.  She is a former smoker as documented above.  We reviewed this today in the office via phone.  Observations/Objective: 09/25/2020 CT chest: 85mm groundglass nodule within the right upper lobe other smaller groundglass nodules however the one along the fissure line seems to be more solid in appearance.  Concerning for potential malignancy which is not excluded. The patient's images have been independently reviewed by me.    Assessment and Plan:  16 mm groundglass lung nodule within the right upper lobe adjacent to fissure Smaller apical groundglass nodule also noted along the right apex History of tobacco use, quit 2009 Plan: We will recommend a 11-month short-term noncontrasted CT scan follow-up. We discussed next steps today via phone if the lesion stays persistent. If nodule is persistent will likely need to pursue pet imaging, full PFTs and consideration for biopsy versus combined procedure with thoracic surgery. Possible ENB+RATS. We discussed all of this today via phone.  Follow Up Instructions:  Return to see me in 3 months following CT scan results.   I discussed the assessment and treatment plan with the patient. The patient was provided an opportunity to ask questions and all were answered. The patient agreed with the plan and demonstrated an understanding of the instructions.   The patient was advised to call back or seek an in-person evaluation if the symptoms worsen or if the condition fails to improve as anticipated.  I provided 22 minutes of non-face-to-face time during this encounter.   Garner Nash, DO

## 2020-11-20 ENCOUNTER — Other Ambulatory Visit: Payer: Self-pay

## 2020-11-24 ENCOUNTER — Other Ambulatory Visit: Payer: Self-pay

## 2020-11-24 ENCOUNTER — Ambulatory Visit (INDEPENDENT_AMBULATORY_CARE_PROVIDER_SITE_OTHER): Payer: Medicare HMO | Admitting: Internal Medicine

## 2020-11-24 ENCOUNTER — Encounter: Payer: Self-pay | Admitting: Internal Medicine

## 2020-11-24 VITALS — BP 146/82 | HR 61 | Ht 64.0 in | Wt 193.5 lb

## 2020-11-24 DIAGNOSIS — D3500 Benign neoplasm of unspecified adrenal gland: Secondary | ICD-10-CM | POA: Diagnosis not present

## 2020-11-24 DIAGNOSIS — E042 Nontoxic multinodular goiter: Secondary | ICD-10-CM | POA: Diagnosis not present

## 2020-11-24 NOTE — Progress Notes (Signed)
Name: Dorothy Gross  MRN/ DOB: [id number], 1951-06-05    Age/ Sex: 70 y.o., female     PCP: Dorothy Pink, MD   Reason for Endocrinology Evaluation: Adrenal adenoma      Initial Endocrinology Clinic Visit: 05/26/2020    PATIENT IDENTIFIER: Dorothy Gross is a 69 y.o., female with a past medical history of A. Fib, CHF, HTN and nephrolithiasis. She has followed with [facility] since 05/26/2020  for consultative assistance with management of her adrenal adenoma  HISTORICAL SUMMARY:   Pt had an incidental finding of bilateral adrenal nodularity bilaterally L>R, with a HFU of 40 and 95 post-contrast on CT scan in 12/2019 during painless hematuria workup.   Her Aldo , renin , urinary free cortisol were all normal. Metanephrines were slightly elevated at less then 2x the upper limit of normal, considered not clinically significant.   NO FH of adrenal gland issues, has strong FH of HTN NO FH of renal stones     THYROID HISTORY: She was noted with left thyroid nodule on exam in 04/2020, this was confirmed by a thyroid ultrasound revealing a left inferior 2.8 cm meeting FNA criteria.  She is S/P FNA of the left thyroid nodule on 06/2020 with benign cytology   SUBJECTIVE:    Today (11/24/2020):  Dorothy Gross is here for adrenal adenoma.      Adrenal incidentaloma: Substantial weight gain- no Centripetal obesity- no Easy bruisbility-on Eliquis  Severe hypertension- no DM-yes  Proximal muscle weakness- no Fatigue- no Sudden/ severe headaches- no Anxiety attacks- no Sweating- no Cardiac arrhythmias- A. Fib  Palpitations- no Fluid retention- no, keeps LE elevated  Hypokalemia- no   Denies local neck symptoms  Had teeth pulled with ear and jaw pain recently   HISTORY:  Past Medical History:  Past Medical History:  Diagnosis Date  . A-fib ([place])   . Arthritis   . CAD (coronary artery disease)   . Cardiomyopathy ([place])   . CHF (congestive  heart failure) ([place])   . Diabetes mellitus without complication ([place])   . History of kidney stones   . Hyperlipidemia   . Hypertension    Past Surgical History:  Past Surgical History:  Procedure Laterality Date  . ABDOMINAL HYSTERECTOMY    . APPENDECTOMY    . BREAST EXCISIONAL BIOPSY Right [id number]   negative 1974 negative 2015  . CHOLECYSTECTOMY N/A 12/05/2015   Procedure: LAPAROSCOPIC CHOLECYSTECTOMY WITH INTRAOPERATIVE CHOLANGIOGRAM;  Surgeon: Leonie Green, MD;  Location: [facility] ORS;  Service: General;  Laterality: N/A;  . COLONOSCOPY WITH PROPOFOL N/A 10/18/2016   Procedure: COLONOSCOPY WITH PROPOFOL;  Surgeon: Manya Silvas, MD;  Location: [facility] ENDOSCOPY;  Service: Endoscopy;  Laterality: N/A;  . COLONOSCOPY WITH PROPOFOL N/A 03/17/2018   Procedure: COLONOSCOPY WITH PROPOFOL;  Surgeon: Manya Silvas, MD;  Location[facility] ENDOSCOPY;  Service: Endoscopy;  Laterality: N/A;  . CYSTOSCOPY W/ RETROGRADES Bilateral 03/07/2020   Procedure: CYSTOSCOPY WITH RETROGRADE PYELOGRAM;  Surgeon: Billey Co, MD;  Location: [facility] ORS;  Service: Urology;  Laterality: Bilateral;  . CYSTOSCOPY/URETEROSCOPY/HOLMIUM LASER/STENT PLACEMENT Bilateral 03/07/2020   Procedure: CYSTOSCOPY/URETEROSCOPY/HOLMIUM LASER/STENT PLACEMENT;  Surgeon: Billey Co, MD;  Location: [facility] ORS;  Service: Urology;  Laterality: Bilateral;  . JOINT REPLACEMENT     both knees  . REPLACEMENT TOTAL KNEE BILATERAL    . TONSILLECTOMY     Social History:  reports that she quit smoking about 13 years ago. Her smoking use included cigarettes. She has a 3.75  pack-year smoking history. She has never used smokeless tobacco. She reports that she does not drink alcohol and does not use drugs. Family History:  Family History  Problem Relation Age of Onset  . Diabetes Mother   . Diabetes Sister   . Diabetes Maternal Aunt   . Breast cancer Neg Hx      HOME MEDICATIONS: Allergies as of 11/24/2020   No Known Allergies      Medication List       Accurate as of November 24, 2020 11:07 AM. If you have any questions, ask your nurse or doctor.        amiodarone 200 MG tablet Commonly known as: PACERONE Take 200 mg by mouth daily.   apixaban 5 MG Tabs tablet Commonly known as: ELIQUIS Take 5 mg by mouth 2 (two) times daily.   Biotin 1000 MCG tablet Take 1,000 mcg by mouth daily.   calcium carbonate 1250 (500 Ca) MG tablet Commonly known as: OS-CAL - dosed in mg of elemental calcium Take 2 tablets by mouth daily with breakfast.   carvedilol 6.25 MG tablet Commonly known as: COREG Take 6.25 mg by mouth 2 (two) times daily with a meal.   cyanocobalamin 1000 MCG tablet Take 1,000 mcg by mouth daily.   digoxin 0.125 MG tablet Commonly known as: LANOXIN Take 0.125 mg by mouth daily.   ferrous sulfate 325 (65 FE) MG tablet Take 325 mg by mouth daily with breakfast.   FIBER SELECT GUMMIES PO Take 2 tablets by mouth daily.   FLAX SEED OIL PO Take 2 capsules by mouth daily.   fluticasone 50 MCG/ACT nasal spray Commonly known as: FLONASE Place 1 spray into both nostrils daily as needed for allergies.   furosemide 40 MG tablet Commonly known as: LASIX Take 40 mg by mouth daily.   Lancets Misc   losartan 50 MG tablet Commonly known as: COZAAR Take 50 mg by mouth daily.   metFORMIN 500 MG 24 hr tablet Commonly known as: GLUCOPHAGE-XR Take 1,000 mg by mouth every evening.   MULTIVITAMIN ADULT PO Take 1 tablet by mouth daily.   OneTouch Ultra test strip Generic drug: glucose blood USE 1 EACH (1 STRIP TOTAL) 3 (THREE) TIMES DAILY   rosuvastatin 10 MG tablet Commonly known as: CRESTOR Take 10 mg by mouth daily.   Vitamin D3 25 MCG (1000 UT) Caps Take 1,000 Units by mouth daily.   zinc gluconate 50 MG tablet Take 50 mg by mouth daily.         OBJECTIVE:   PHYSICAL EXAM: VS: BP (!) 146/82   Pulse 61   Ht 5\' 4"  (1.626 m)   Wt 193 lb 8 oz (87.8 kg)   SpO2 97%   BMI 33.21  kg/m    EXAM: General: Pt appears well and is in NAD  Thyroid : Left thyroid nodule appreciated   Lungs: Clear with good BS bilat with no rales, rhonchi, or wheezes  Heart: Auscultation: RRR.  Abdomen: Normoactive bowel sounds, soft, nontender, without masses or organomegaly palpable  Extremities:  BL LE: No pretibial edema normal ROM and strength.  Skin: Hair: Texture and amount normal with gender appropriate distribution Skin Inspection: No rashes Skin Palpation: Skin temperature, texture, and thickness normal to palpation  Mental Status: Judgment, insight: Intact Orientation: Oriented to time, place, and person Mood and affect: No depression, anxiety, or agitation     DATA REVIEWED:   Results for YAMILKA, LOPICCOLO (MRN [id number]) as of 11/24/2020 07:26  Ref. Range 05/26/2020 01:00 05/26/2020 14:20  ALDOSTERONE Latest Ref Range:  ng/dL 4   ALDO / PRA Ratio Latest Ref Range: 0.9 - 28.9 Ratio 13.8   Glucose Latest Ref Range: 70 - 99 mg/dL  166 (H)  TSH Latest Ref Range: 0.35 - 4.50 uIU/mL  2.46  T4,Free(Direct) Latest Ref Range: 0.60 - 1.60 ng/dL  1.34      FNA of the left  thyroid Nodule 07/18/2020  DIAGNOSIS:  A. THYROID GLAND, LEFT LOBE; ULTRASOUND-GUIDED FINE-NEEDLE ASPIRATION:  - BENIGN ([place] CATEGORY II).     CT abdomen 06/2020 Stable appearance of bilateral nodules. This is a reassuring feature suggesting a benign abnormality. Follow-up imaging at 12 months (from 01/09/2020) is recommended with adrenal protocol MRI is recommended to ensure 12 months of stability 2. 4 mm nodule identified within the right lower lobe. This is in an area not imaged on the previous CT of the abdomen pelvis. No follow-up needed if patient is low-risk. Non-contrast chest CT can be considered in 12 months if patient is high-risk. This recommendation follows the consensus statement: Guidelines for Management of Incidental Pulmonary Nodules Detected on CT Images: From the  Fleischner Society 2017; Radiology 2017; 284:228-243. 3. Bilateral nonobstructing renal calculi. 4. Aortic atherosclerosis.  CT Scan 01/09/2020  Adrenals/Urinary Tract: Mild adrenal nodularity bilaterally dominant area on the LEFT measuring 11 x 13 mm with baseline density of 40 Hounsfield units. Post-contrast density of nearly 95 Hounsfield units  ASSESSMENT / PLAN / RECOMMENDATIONS:    Adrenal Adenoma:   - B/L adrenal adenomas , stable based on last CT scan 06/2020 -These are  Nonsecretory, no biochemical or chemical evidence of cushing syndrome, pheo. Or hyperaldosteronism  - Will repeat imaging in 07/2021  2. Multinodular Goiter:   - Pt is clinically euthyroid  - No local neck symptoms - TFT's levels are normal  - S/P benign FNA of the left thyroid nodule 06/2020    F/U in 1 yr    Signed electronically by: Mack Guise, MD  [facility]  [facility] [address] [place],  [address] Phone: [phone] FAX: [phone]      CC: Dorothy Pink, MD [address] [place] [address] Phone: [phone]  Fax: [phone]   Return to Endocrinology clinic as below: Future Appointments  Date Time Provider [place]  05/21/2021  9:00 AM Billey Co, MD [facility] None

## 2020-12-05 ENCOUNTER — Telehealth: Payer: Self-pay | Admitting: Pulmonary Disease

## 2020-12-08 NOTE — Telephone Encounter (Signed)
Called pt to sched CT. Sched for 01/20/21 at 9:30am at [facility] outpt. Sched f/u appt w/ Dr. I card for 01/29/21 at 9:00am. Nothing further needed.

## 2021-01-20 ENCOUNTER — Other Ambulatory Visit: Payer: Self-pay

## 2021-01-20 ENCOUNTER — Ambulatory Visit
Admission: RE | Admit: 2021-01-20 | Discharge: 2021-01-20 | Disposition: A | Discharge status: Home / Self Care | Payer: Medicare HMO | Source: Ambulatory Visit | Attending: Pulmonary Disease | Admitting: Pulmonary Disease

## 2021-01-20 DIAGNOSIS — R918 Other nonspecific abnormal finding of lung field: Secondary | ICD-10-CM | POA: Insufficient documentation

## 2021-01-20 DIAGNOSIS — R911 Solitary pulmonary nodule: Secondary | ICD-10-CM | POA: Diagnosis present

## 2021-01-20 DIAGNOSIS — Z87891 Personal history of nicotine dependence: Secondary | ICD-10-CM | POA: Insufficient documentation

## 2021-01-20 IMAGING — CT CT CHEST SUPER D W/O CM
2 of 5 series · 15 of 36 positions shown, 18 images · non-contrast
Comparison: [DATE].  CT abdomen pelvis [DATE].

CLINICAL DATA: Pre bronchoscopic planning.  Right breast cancer.

EXAM:
CT CHEST WITHOUT CONTRAST
TECHNIQUE: Multidetector CT imaging of the chest was performed using thin slice
collimation for electromagnetic bronchoscopy planning purposes,
without intravenous contrast.

[Series 4: thins chest 1.00 · axial · 0.65mm/px · z∈[-1172,-894]mm · 12 of 459 slices shown, 15 images]
[im 31/459  mediastinal]
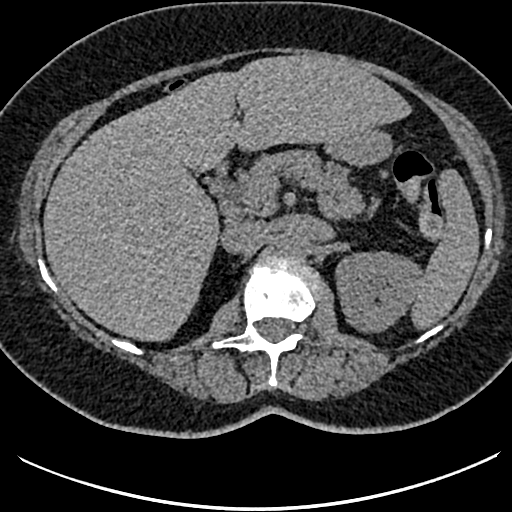
[im 31/459  lung]
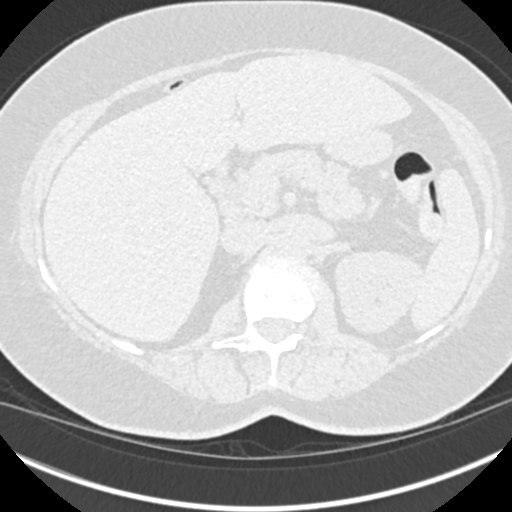
[im 62/459  lung]
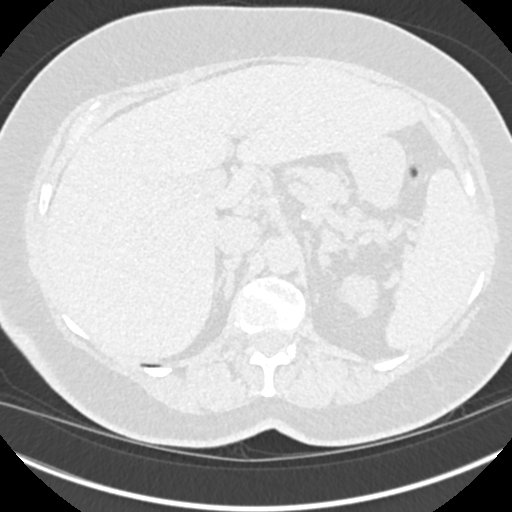
[im 92/459  lung]
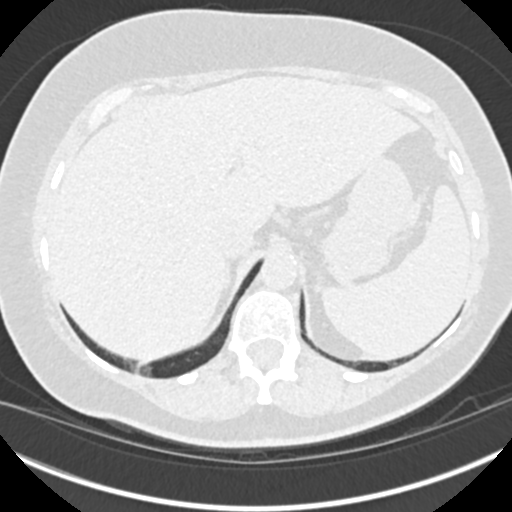
[im 153/459  lung]
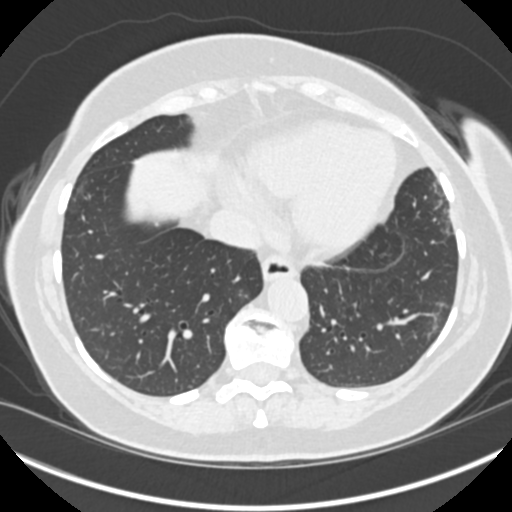
[im 184/459  mediastinal]
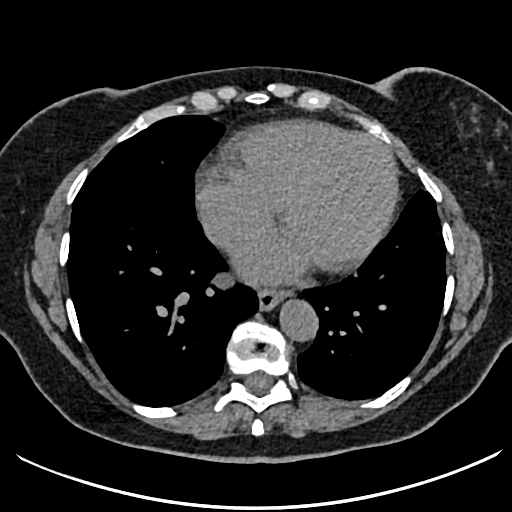
[im 184/459  lung]
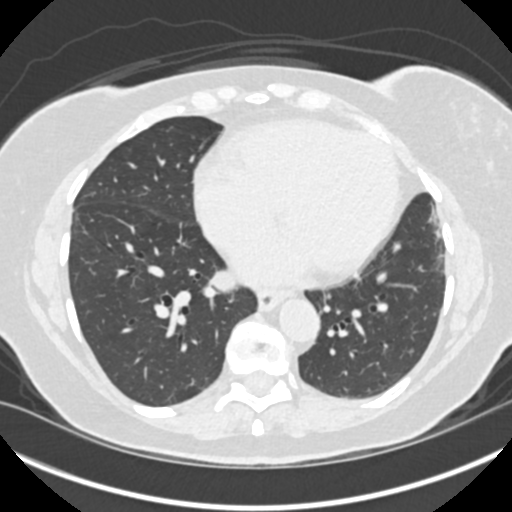
[im 214/459  lung]
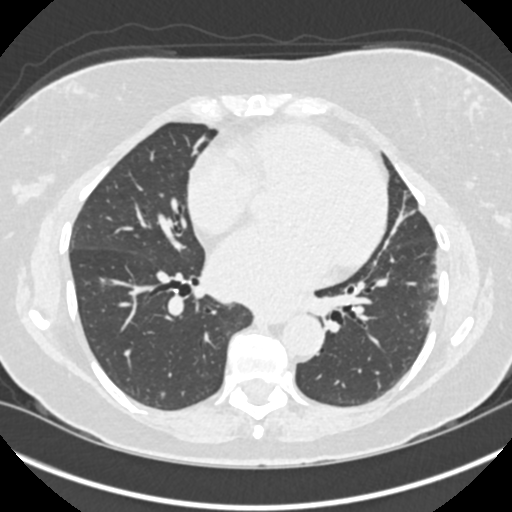
[im 245/459  lung]
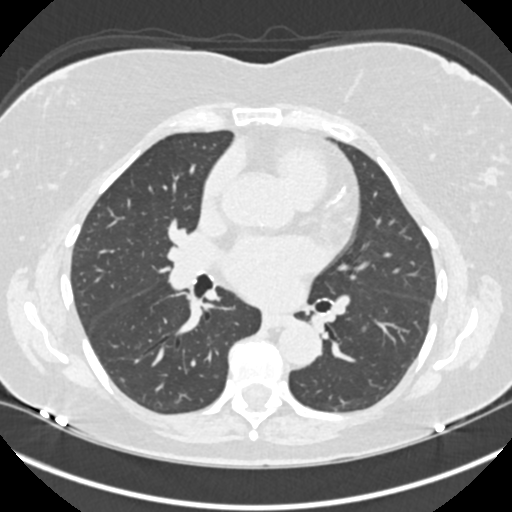
[im 275/459  lung]
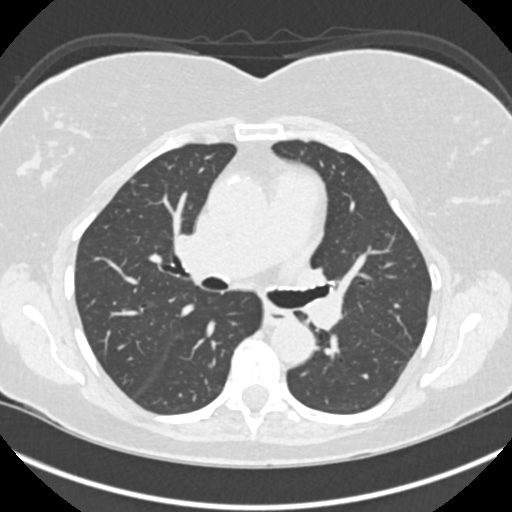
[im 306/459  mediastinal]
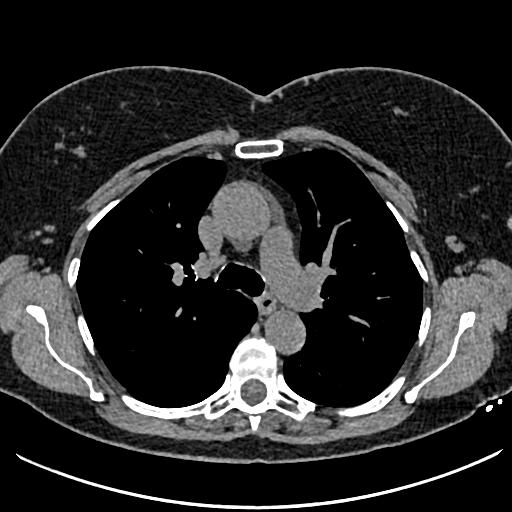
[im 306/459  lung]
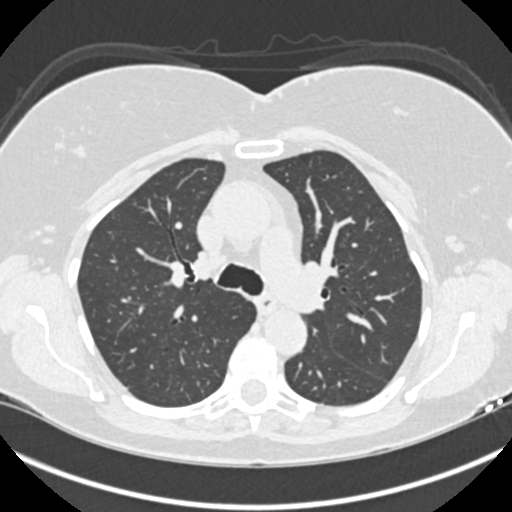
[im 367/459  lung]
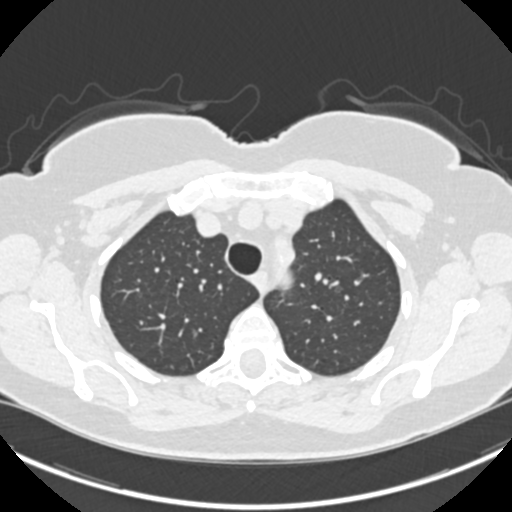
[im 397/459  lung]
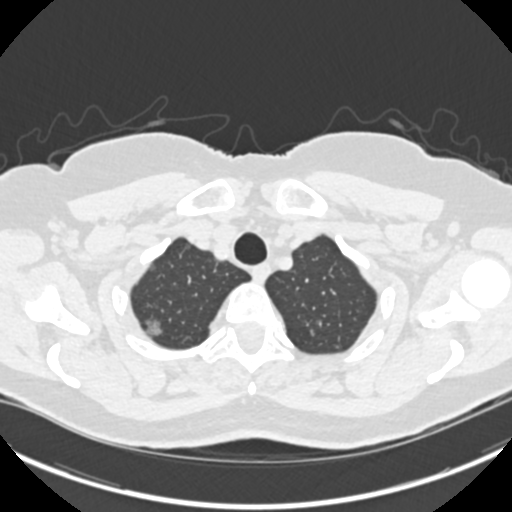
[im 428/459  lung]
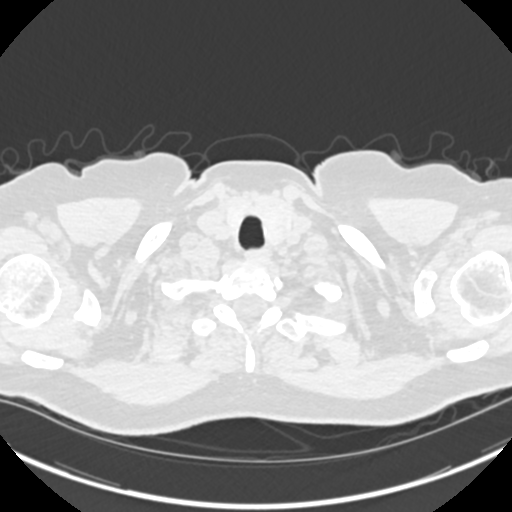

[Series 5: coronals chest 2.00 cor · coronal · 0.63mm/px · 3 of 145 slices shown]
[im 29/145  lung]
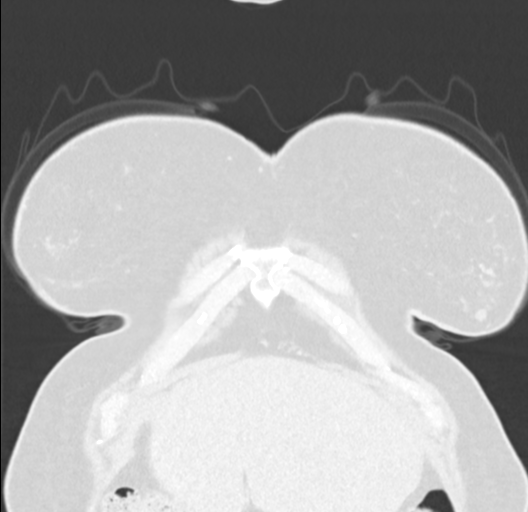
[im 58/145  lung]
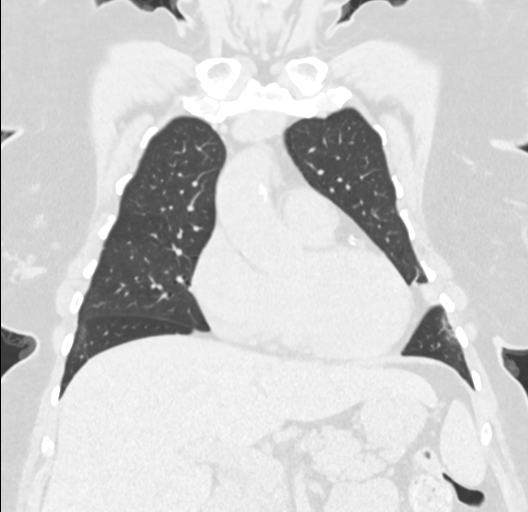
[im 87/145  lung]
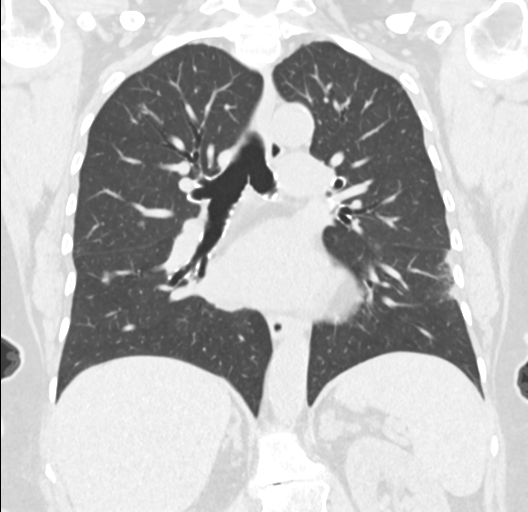

[15 of 36 positions shown; findings below may reference images not displayed]

FINDINGS: Cardiovascular: Atherosclerotic calcification of the aorta, aortic
valve and coronary arteries. Heart is enlarged. No pericardial
effusion.

Mediastinum/Nodes: Heterogeneous enlarged left lobe of the thyroid
with a 1.7 cm nodule in the lower pole. No pathologically enlarged
mediastinal or axillary lymph nodes. Hilar regions are difficult to
definitively evaluate without IV contrast. Esophagus is grossly
unremarkable.

Lungs/Pleura: Multiple bilateral ground-glass nodules are somewhat
upper and midlung zone predominant. Dominant nodule in the posterior
segment right upper lobe measures 1.6 x 1.8 cm (3/61), as on
[DATE]. No internal solid components. Scarring in the right
middle lobe and lingula. Subpleural reticular densities and
ground-glass are seen posterolaterally in the lung bases. No pleural
fluid. Airway is unremarkable.

Upper Abdomen: Visualized portion of the liver is unremarkable.
Cholecystectomy. Nodular thickening of the adrenal glands
bilaterally. Dominant nodule in the left adrenal gland measures
cm, stable from [DATE], suggestive of a lipid poor adenoma.
Visualized portions of the left kidney, spleen, pancreas, stomach
and bowel are grossly unremarkable.

Musculoskeletal: Degenerative changes in the spine. No worrisome
lytic or sclerotic lesions.
IMPRESSION: 1. Multiple bilateral upper and midlung zone predominant
ground-glass pulmonary nodules with the largest in the posterior
segment right upper lobe, as on [DATE]. Adenocarcinoma cannot be
excluded. Study was performed for bronchoscopic planning purposes.
2. Peripheral and basilar predominant subpleural reticular densities
and ground-glass, findings which can be seen with interstitial lung
disease such as nonspecific interstitial pneumonitis.
3. 1.7 cm left thyroid nodule. No follow-up recommended unless
clinically warranted as patient underwent ultrasound [DATE] and
biopsy [DATE]. (Ref: [HOSPITAL]. [DATE]): 143-50).
4. Bilateral adrenal nodularity. Dominant left adrenal nodule,
stable and likely a lipid poor adenoma.
5. Aortic atherosclerosis ([OX]-[OX]). Coronary artery
calcification.

## 2021-01-29 ENCOUNTER — Ambulatory Visit: Payer: Medicare HMO | Admitting: Pulmonary Disease

## 2021-01-29 ENCOUNTER — Other Ambulatory Visit: Payer: Self-pay

## 2021-01-29 ENCOUNTER — Encounter: Payer: Self-pay | Admitting: Pulmonary Disease

## 2021-01-29 ENCOUNTER — Telehealth: Payer: Self-pay | Admitting: Pulmonary Disease

## 2021-01-29 VITALS — BP 164/80 | HR 61 | Ht 64.0 in | Wt 196.0 lb

## 2021-01-29 DIAGNOSIS — R918 Other nonspecific abnormal finding of lung field: Secondary | ICD-10-CM

## 2021-01-29 DIAGNOSIS — R911 Solitary pulmonary nodule: Secondary | ICD-10-CM

## 2021-01-29 DIAGNOSIS — Z87891 Personal history of nicotine dependence: Secondary | ICD-10-CM

## 2021-01-29 NOTE — Patient Instructions (Signed)
Thank you for visiting Dr. Valeta Harms at [facility]. Today we recommend the following:  Orders Placed This Encounter  Procedures  . Procedural/ Surgical Case Request: VIDEO BRONCHOSCOPY WITH ENDOBRONCHIAL NAVIGATION  . CT Super D Chest Wo Contrast  . Ambulatory referral to Pulmonology   Hold eliquis 2 full days prior Last dose Friday before  Return in about 2 months (around 03/31/2021) for with APP or Dr. Valeta Harms.    Please do your part to reduce the spread of COVID-19.

## 2021-01-29 NOTE — Telephone Encounter (Signed)
Noted  

## 2021-01-29 NOTE — Progress Notes (Signed)
Synopsis: Referred in December 2021 for lung nodule by Maryland Pink, MD  Subjective:   PATIENT ID: Dorothy Gross GENDER: female DOB: 10-28-1950, MRN: [id number]  Chief Complaint  Patient presents with  . Follow-up    Lung nodule    70 year old female past medical history of atrial fibrillation, CAD, cardiomyopathy (?viral CM ~7 years ago), type 2 diabetes, hypertension, hyperlipidemia.  Patient referred in November following a CT abdomen and pelvis on 07/25/2020 which revealed stable bilateral adrenal nodules, new 4 mm right lower lobe pulmonary nodule which recommended follow-up.  Patient has no respiratory complaints today.  She states this entire story started with a diagnosis of kidney stones in which she had adrenal nodules found.  She had follow-up imaging of the adrenal nodules which revealed the abnormality on her CT scan.  Overall she currently works for [employer].  She is worked there for 47 years.  She is a former smoker.  She is smoked on and off since that about the age of 71.  At her heaviest was a half a pack a day.  There is several years in between which she quit at the time that she was pregnant and restarted after each pregnancy.  She quit after her last child and ultimately restarted smoking after her divorce.  She finally quit smoking in 2009.  OV 01/29/2021: Here today for follow-up regarding recent CT scan of the chest.  CT scan of the chest on 01/20/2021 multiple bilateral upper and midlung zone predominant groundglass nodules the largest within the posterior right upper lobe.  Multifocal adenocarcinoma cannot be excluded.  Patient has peripheral and basilar predominant subpleural densities as well concerning for possible underlying NSIP.  Patient has no respiratory complaints today.   Past Medical History:  Diagnosis Date  . A-fib ([place])   . Arthritis   . CAD (coronary artery disease)   . Cardiomyopathy ([place])   . CHF (congestive heart failure) ([place])   .  Diabetes mellitus without complication ([place])   . History of kidney stones   . Hyperlipidemia   . Hypertension      Family History  Problem Relation Age of Onset  . Diabetes Mother   . Diabetes Sister   . Diabetes Maternal Aunt   . Breast cancer Neg Hx      Past Surgical History:  Procedure Laterality Date  . ABDOMINAL HYSTERECTOMY    . APPENDECTOMY    . BREAST EXCISIONAL BIOPSY Right [id number]   negative 1974 negative 2015  . CHOLECYSTECTOMY N/A 12/05/2015   Procedure: LAPAROSCOPIC CHOLECYSTECTOMY WITH INTRAOPERATIVE CHOLANGIOGRAM;  Surgeon: Leonie Green, MD;  Location: [facility] ORS;  Service: General;  Laterality: N/A;  . COLONOSCOPY WITH PROPOFOL N/A 10/18/2016   Procedure: COLONOSCOPY WITH PROPOFOL;  Surgeon: Manya Silvas, MD;  Location: [facility] ENDOSCOPY;  Service: Endoscopy;  Laterality: N/A;  . COLONOSCOPY WITH PROPOFOL N/A 03/17/2018   Procedure: COLONOSCOPY WITH PROPOFOL;  Surgeon: Manya Silvas, MD;  Location: [facility] ENDOSCOPY;  Service: Endoscopy;  Laterality: N/A;  . CYSTOSCOPY W/ RETROGRADES Bilateral 03/07/2020   Procedure: CYSTOSCOPY WITH RETROGRADE PYELOGRAM;  Surgeon: Billey Co, MD;  Location: [facility] ORS;  Service: Urology;  Laterality: Bilateral;  . CYSTOSCOPY/URETEROSCOPY/HOLMIUM LASER/STENT PLACEMENT Bilateral 03/07/2020   Procedure: CYSTOSCOPY/URETEROSCOPY/HOLMIUM LASER/STENT PLACEMENT;  Surgeon: Billey Co, MD;  Location: [facility] ORS;  Service: Urology;  Laterality: Bilateral;  . JOINT REPLACEMENT     both knees  . REPLACEMENT TOTAL KNEE BILATERAL    . TONSILLECTOMY  Social History   Socioeconomic History  . Marital status: Divorced    Spouse name: Not on file  . Number of children: Not on file  . Years of education: Not on file  . Highest education level: Not on file  Occupational History  . Not on file  Tobacco Use  . Smoking status: Former Smoker    Packs/day: 0.25    Years: 15.00    Pack years: 3.75    Types: Cigarettes    Quit  date: 08/31/2007    Years since quitting: 13.4  . Smokeless tobacco: Never Used  Vaping Use  . Vaping Use: Never used  Substance and Sexual Activity  . Alcohol use: No    Alcohol/week: 0.0 standard drinks  . Drug use: No  . Sexual activity: Not on file  Other Topics Concern  . Not on file  Social History Narrative  . Not on file   Social Determinants of Health   Financial Resource Strain: Not on file  Food Insecurity: Not on file  Transportation Needs: Not on file  Physical Activity: Not on file  Stress: Not on file  Social Connections: Not on file  Intimate Partner Violence: Not on file     No Known Allergies   Outpatient Medications Prior to Visit  Medication Sig Dispense Refill  . amiodarone (PACERONE) 200 MG tablet Take 200 mg by mouth daily.     Marland Kitchen apixaban (ELIQUIS) 5 MG TABS tablet Take 5 mg by mouth 2 (two) times daily.     . Biotin 1000 MCG tablet Take 1,000 mcg by mouth daily.    . calcium carbonate (OS-CAL - DOSED IN MG OF ELEMENTAL CALCIUM) 1250 (500 Ca) MG tablet Take 2 tablets by mouth daily with breakfast.     . carvedilol (COREG) 6.25 MG tablet Take 6.25 mg by mouth 2 (two) times daily with a meal.     . Cholecalciferol (VITAMIN D3) 1000 units CAPS Take 1,000 Units by mouth daily.     . cyanocobalamin 1000 MCG tablet Take 1,000 mcg by mouth daily.     . digoxin (LANOXIN) 0.125 MG tablet Take 0.125 mg by mouth daily.     . ferrous sulfate 325 (65 FE) MG tablet Take 325 mg by mouth daily with breakfast.     . FIBER SELECT GUMMIES PO Take 2 tablets by mouth daily.    . Flaxseed, Linseed, (FLAX SEED OIL PO) Take 2 capsules by mouth daily.    . fluticasone (FLONASE) 50 MCG/ACT nasal spray Place 1 spray into both nostrils daily as needed for allergies.     . furosemide (LASIX) 40 MG tablet Take 40 mg by mouth daily.     Marland Kitchen glucose blood (ONETOUCH ULTRA) test strip USE 1 EACH (1 STRIP TOTAL) 3 (THREE) TIMES DAILY    . Lancets MISC     . losartan (COZAAR) 50 MG tablet  Take 50 mg by mouth daily.     . metFORMIN (GLUCOPHAGE-XR) 500 MG 24 hr tablet Take 1,000 mg by mouth every evening.     . Multiple Vitamins-Minerals (MULTIVITAMIN ADULT PO) Take 1 tablet by mouth daily.    . rosuvastatin (CRESTOR) 10 MG tablet Take 10 mg by mouth daily.     Marland Kitchen zinc gluconate 50 MG tablet Take 50 mg by mouth daily.      No facility-administered medications prior to visit.    Review of Systems  Constitutional: Negative for chills, fever, malaise/fatigue and weight loss.  HENT: Negative for  hearing loss, sore throat and tinnitus.   Eyes: Negative for blurred vision and double vision.  Respiratory: Negative for cough, hemoptysis, sputum production, shortness of breath, wheezing and stridor.   Cardiovascular: Negative for chest pain, palpitations, orthopnea, leg swelling and PND.  Gastrointestinal: Negative for abdominal pain, constipation, diarrhea, heartburn, nausea and vomiting.  Genitourinary: Negative for dysuria, hematuria and urgency.  Musculoskeletal: Negative for joint pain and myalgias.  Skin: Negative for itching and rash.  Neurological: Negative for dizziness, tingling, weakness and headaches.  Endo/Heme/Allergies: Negative for environmental allergies. Does not bruise/bleed easily.  Psychiatric/Behavioral: Negative for depression. The patient is not nervous/anxious and does not have insomnia.   All other systems reviewed and are negative.    Objective:  Physical Exam Vitals reviewed.  Constitutional:      General: She is not in acute distress.    Appearance: She is well-developed.  HENT:     Head: Normocephalic and atraumatic.  Eyes:     General: No scleral icterus.    Conjunctiva/sclera: Conjunctivae normal.     Pupils: Pupils are equal, round, and reactive to light.  Neck:     Vascular: No JVD.     Trachea: No tracheal deviation.  Cardiovascular:     Rate and Rhythm: Normal rate and regular rhythm.     Heart sounds: Normal heart sounds. No murmur  heard.   Pulmonary:     Effort: Pulmonary effort is normal. No tachypnea, accessory muscle usage or respiratory distress.     Breath sounds: Normal breath sounds. No stridor. No wheezing, rhonchi or rales.  Abdominal:     General: Bowel sounds are normal. There is no distension.     Palpations: Abdomen is soft.     Tenderness: There is no abdominal tenderness.  Musculoskeletal:        General: No tenderness.     Cervical back: Neck supple.  Lymphadenopathy:     Cervical: No cervical adenopathy.  Skin:    General: Skin is warm and dry.     Capillary Refill: Capillary refill takes less than 2 seconds.     Findings: No rash.  Neurological:     Mental Status: She is alert and oriented to person, place, and time.  Psychiatric:        Behavior: Behavior normal.      Vitals:   01/29/21 0853  BP: (!) 164/80  Pulse: 61  SpO2: 97%  Weight: 196 lb (88.9 kg)  Height: 5\' 4"  (1.626 m)   97% on RA BMI Readings from Last 3 Encounters:  01/29/21 33.64 kg/m  11/24/20 33.21 kg/m  08/11/20 34.33 kg/m   Wt Readings from Last 3 Encounters:  01/29/21 196 lb (88.9 kg)  11/24/20 193 lb 8 oz (87.8 kg)  08/11/20 200 lb (90.7 kg)     CBC    Component Value Date/Time   WBC 8.8 03/05/2020 0827   RBC 4.88 03/05/2020 0827   HGB 13.6 03/05/2020 0827   HGB 14.4 02/10/2013 0555   HCT 42.3 03/05/2020 0827   HCT 43.8 02/10/2013 0555   PLT 298 03/05/2020 0827   PLT 345 02/10/2013 0555   MCV 86.7 03/05/2020 0827   MCV 83 02/10/2013 0555   MCH 27.9 03/05/2020 0827   MCHC 32.2 03/05/2020 0827   RDW 15.6 (H) 03/05/2020 0827   RDW 14.5 02/10/2013 0555   LYMPHSABS 3.2 02/10/2013 0555   MONOABS 0.8 02/10/2013 0555   EOSABS 0.1 02/10/2013 0555   BASOSABS 0.1 02/10/2013 0555  Chest Imaging: CT Abd Pelvis Small 25mm RLL pulmonary nodule The patient's images have been independently reviewed by me.    09/25/2020 CT chest: 39mm groundglass nodule within the right upper lobe other small  groundglass lesions identified. The patient's images have been independently reviewed by me.    01/20/2021: Super D CT chest: Persistent bilateral groundglass nodules upper and midlung zone predominant.  Multifocal adenocarcinoma not excluded. The patient's images have been independently reviewed by me.    Pulmonary Functions Testing Results: No flowsheet data found.  FeNO:   Pathology:   Echocardiogram:   Heart Catheterization:     Assessment & Plan:     ICD-10-CM   1. Lung nodule  R91.1 Ambulatory referral to Pulmonology    CT Super D Chest Wo Contrast    Procedural/ Surgical Case Request: VIDEO BRONCHOSCOPY WITH ENDOBRONCHIAL NAVIGATION  2. Ground glass opacity present on imaging of lung  R91.8 Ambulatory referral to Pulmonology    CT Super D Chest Wo Contrast    Procedural/ Surgical Case Request: VIDEO BRONCHOSCOPY WITH ENDOBRONCHIAL NAVIGATION  3. Former smoker  Z87.891 Ambulatory referral to Pulmonology    CT Super D Chest Wo Contrast  4. Multiple pulmonary nodules  R91.8 Ambulatory referral to Pulmonology    CT Super D Chest Wo Contrast    Procedural/ Surgical Case Request: VIDEO BRONCHOSCOPY WITH ENDOBRONCHIAL NAVIGATION    Discussion:  This is a 70 year old female, here for follow-up after CT imaging for bilateral groundglass pulmonary nodules.  Initial nodules were found incidentally.  She is a former smoker, she does not have a greater than 20-pack-year history and was not qualify for lung cancer screening screening at the time.  She quit smoking in 2009.  Plan: Today in the office we reviewed her noncontrasted CT of the chest.  She has multiple small groundglass opacities concerning for potential underlying multifocal adenocarcinoma. These lesions are still small and have not changed much since her recent CT in January. She does have a couple of trips planned this summer.  She is interested in having a biopsy of the lesions to confirm diagnosis. We have scheduled  her out for the end of July.  This will be after her second beach trip.  I think that the lesions are slow-growing and have not changed that much.  I think it is appropriate to wait for a few weeks and would be okay with that. We will tentatively schedule for 03/23/2021. We discussed today the risk benefits and alternatives of proceeding with navigational bronchoscopy.    Current Outpatient Medications:  .  amiodarone (PACERONE) 200 MG tablet, Take 200 mg by mouth daily. , Disp: , Rfl:  .  apixaban (ELIQUIS) 5 MG TABS tablet, Take 5 mg by mouth 2 (two) times daily. , Disp: , Rfl:  .  Biotin 1000 MCG tablet, Take 1,000 mcg by mouth daily., Disp: , Rfl:  .  calcium carbonate (OS-CAL - DOSED IN MG OF ELEMENTAL CALCIUM) 1250 (500 Ca) MG tablet, Take 2 tablets by mouth daily with breakfast. , Disp: , Rfl:  .  carvedilol (COREG) 6.25 MG tablet, Take 6.25 mg by mouth 2 (two) times daily with a meal. , Disp: , Rfl:  .  Cholecalciferol (VITAMIN D3) 1000 units CAPS, Take 1,000 Units by mouth daily. , Disp: , Rfl:  .  cyanocobalamin 1000 MCG tablet, Take 1,000 mcg by mouth daily. , Disp: , Rfl:  .  digoxin (LANOXIN) 0.125 MG tablet, Take 0.125 mg by mouth daily. , Disp: ,  Rfl:  .  ferrous sulfate 325 (65 FE) MG tablet, Take 325 mg by mouth daily with breakfast. , Disp: , Rfl:  .  FIBER SELECT GUMMIES PO, Take 2 tablets by mouth daily., Disp: , Rfl:  .  Flaxseed, Linseed, (FLAX SEED OIL PO), Take 2 capsules by mouth daily., Disp: , Rfl:  .  fluticasone (FLONASE) 50 MCG/ACT nasal spray, Place 1 spray into both nostrils daily as needed for allergies. , Disp: , Rfl:  .  furosemide (LASIX) 40 MG tablet, Take 40 mg by mouth daily. , Disp: , Rfl:  .  glucose blood (ONETOUCH ULTRA) test strip, USE 1 EACH (1 STRIP TOTAL) 3 (THREE) TIMES DAILY, Disp: , Rfl:  .  Lancets MISC, , Disp: , Rfl:  .  losartan (COZAAR) 50 MG tablet, Take 50 mg by mouth daily. , Disp: , Rfl:  .  metFORMIN (GLUCOPHAGE-XR) 500 MG 24 hr  tablet, Take 1,000 mg by mouth every evening. , Disp: , Rfl:  .  Multiple Vitamins-Minerals (MULTIVITAMIN ADULT PO), Take 1 tablet by mouth daily., Disp: , Rfl:  .  rosuvastatin (CRESTOR) 10 MG tablet, Take 10 mg by mouth daily. , Disp: , Rfl:  .  zinc gluconate 50 MG tablet, Take 50 mg by mouth daily. , Disp: , Rfl:   I spent 41 minutes dedicated to the care of this patient on the date of this encounter to include pre-visit review of records, face-to-face time with the patient discussing conditions above, post visit ordering of testing, clinical documentation with the electronic health record, making appropriate referrals as documented, and communicating necessary findings to members of the patients care team.    Garner Nash, [facility] 01/29/2021 5:43 PM

## 2021-01-29 NOTE — Telephone Encounter (Signed)
ENB has been scheduled for 7/25 at 7:30 at [facility] End[facility].  CT has been scheduled for 7/18 at 9:30.  Covid test 7/22 at 9:15.  Pt told to hold Eliquis 2 days prior.  Gave appt info to her.

## 2021-02-12 NOTE — Telephone Encounter (Signed)
Pt's procedure has been rescheduled to 9:17 on 7/25 at [facility] End[facility].  Spoke to pt & made her aware.

## 2021-03-16 ENCOUNTER — Ambulatory Visit (INDEPENDENT_AMBULATORY_CARE_PROVIDER_SITE_OTHER)
Admission: RE | Admit: 2021-03-16 | Discharge: 2021-03-16 | Disposition: A | Discharge status: Home / Self Care | Payer: Medicare HMO | Source: Ambulatory Visit | Attending: Pulmonary Disease | Admitting: Pulmonary Disease

## 2021-03-16 ENCOUNTER — Other Ambulatory Visit: Payer: Self-pay

## 2021-03-16 DIAGNOSIS — R918 Other nonspecific abnormal finding of lung field: Secondary | ICD-10-CM

## 2021-03-16 DIAGNOSIS — R911 Solitary pulmonary nodule: Secondary | ICD-10-CM

## 2021-03-16 DIAGNOSIS — Z87891 Personal history of nicotine dependence: Secondary | ICD-10-CM

## 2021-03-16 IMAGING — CT CT CHEST SUPER D W/O CM
2 of 5 series · 15 of 36 positions shown, 18 images · non-contrast
Comparison: Chest CT [DATE].

CLINICAL DATA: 69-year-old female former smoker with history of
ground-glass pulmonary nodules noted on prior examinations.
Follow-up study.

EXAM:
CT CHEST WITHOUT CONTRAST
TECHNIQUE: Multidetector CT imaging of the chest was performed using thin slice
collimation for electromagnetic bronchoscopy planning purposes,
without intravenous contrast.

[Series 4: thins · axial · 0.63mm/px · z∈[+1396,+1664]mm · 12 of 387 slices shown, 15 images]
[im 26/387  mediastinal]
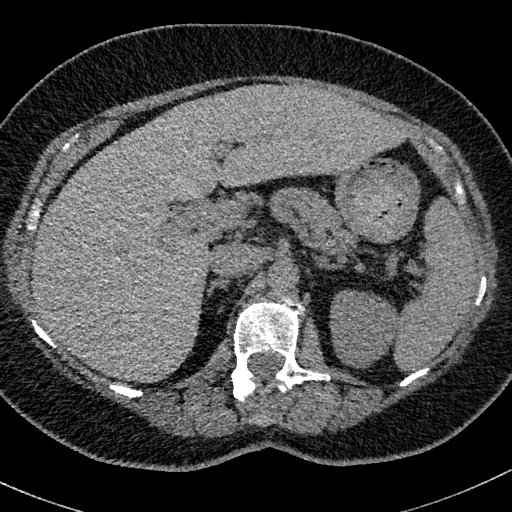
[im 26/387  lung]
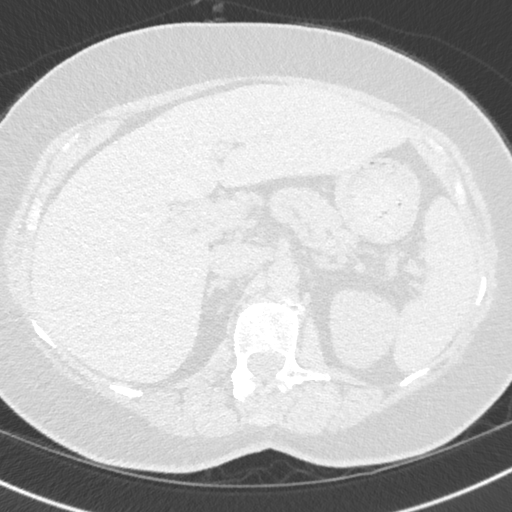
[im 52/387  lung]
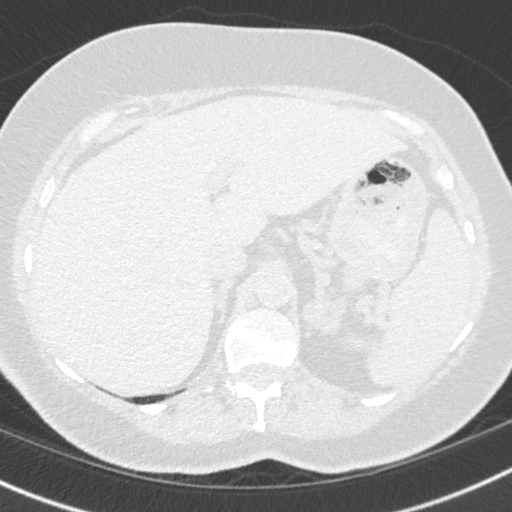
[im 78/387  lung]
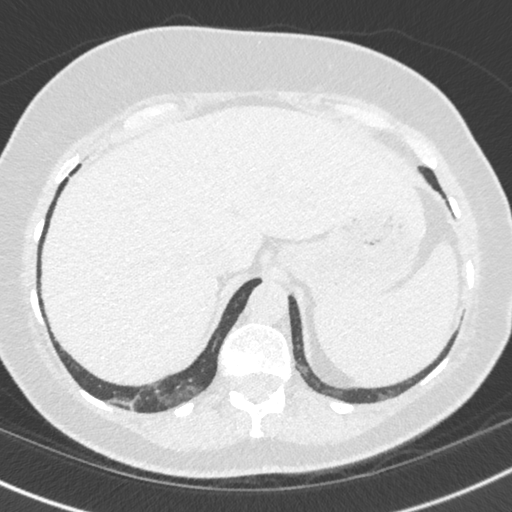
[im 129/387  lung]
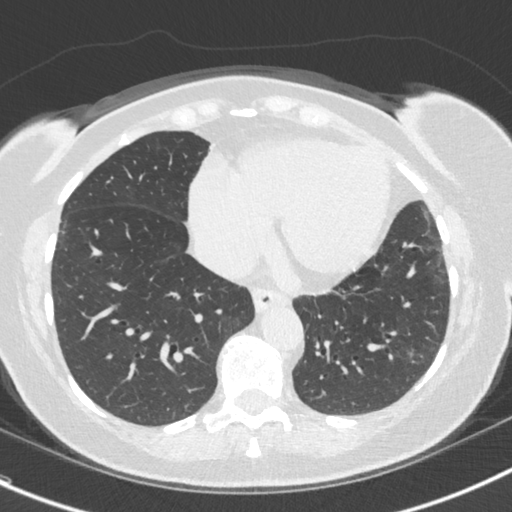
[im 155/387  mediastinal]
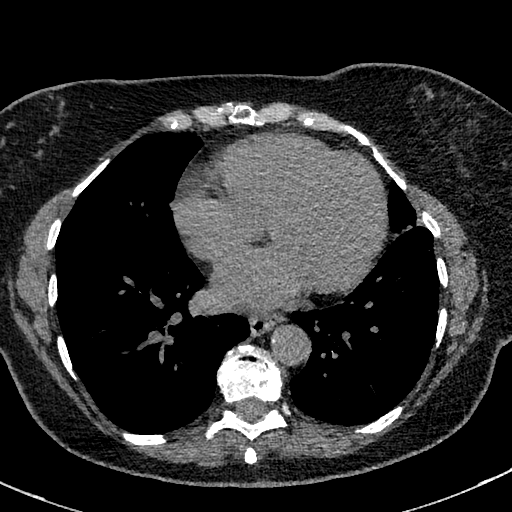
[im 155/387  lung]
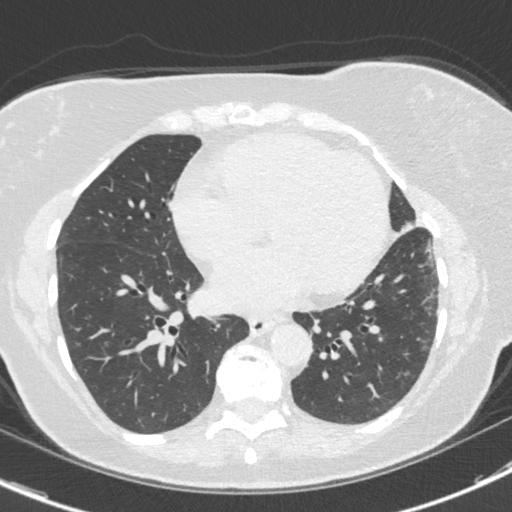
[im 181/387  lung]
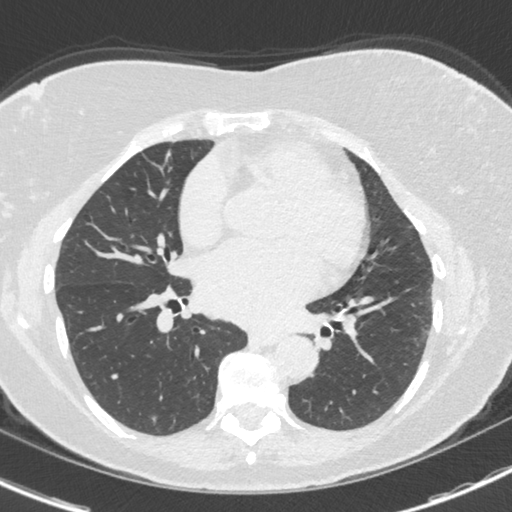
[im 206/387  lung]
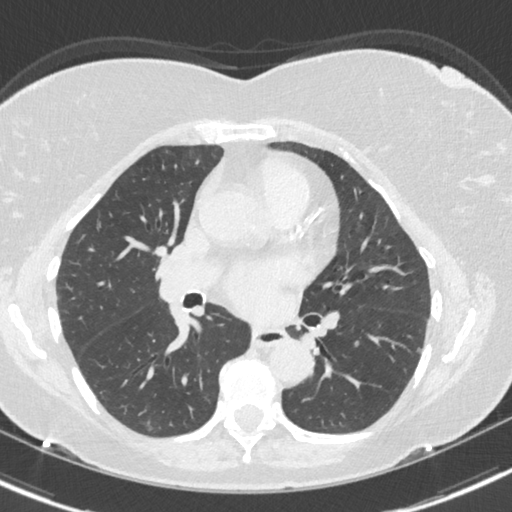
[im 232/387  lung]
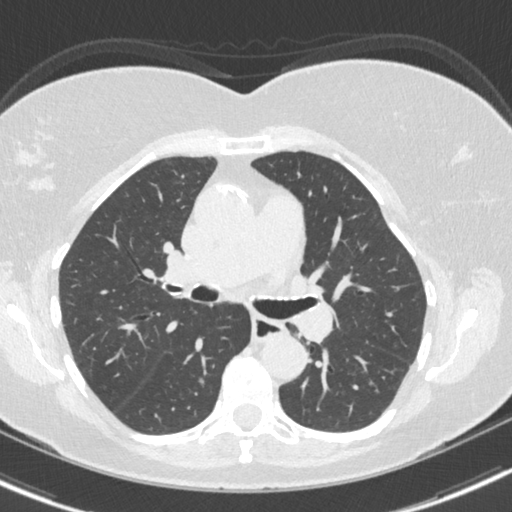
[im 258/387  mediastinal]
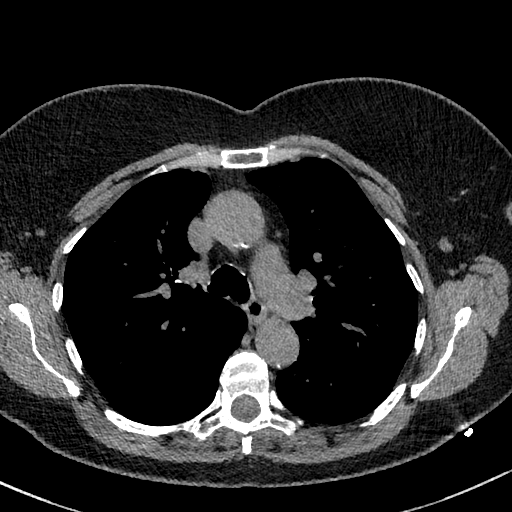
[im 258/387  lung]
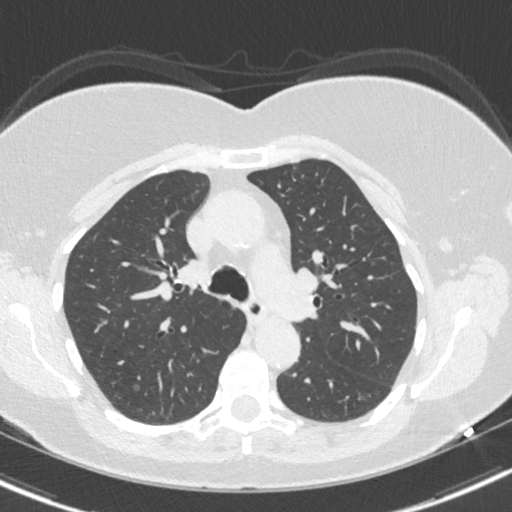
[im 309/387  lung]
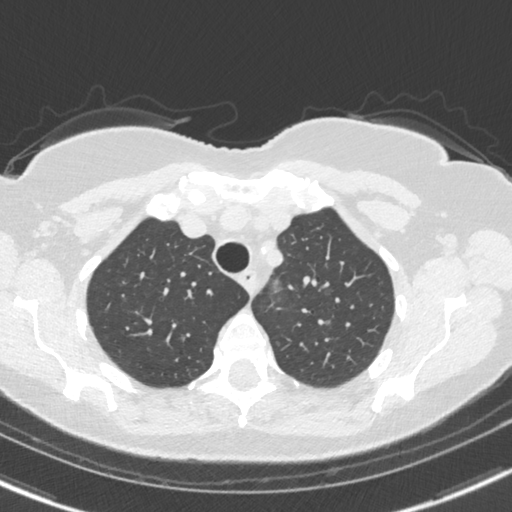
[im 335/387  lung]
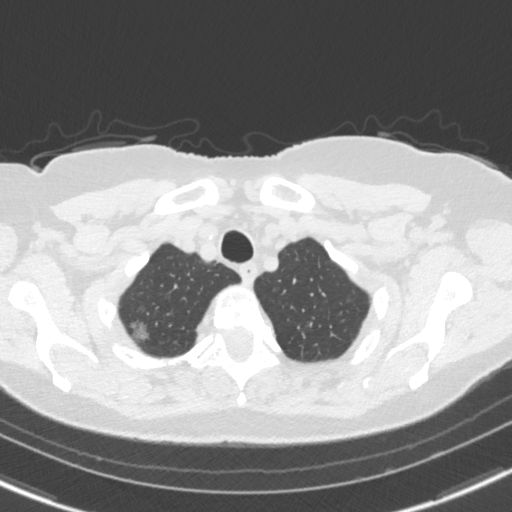
[im 361/387  lung]
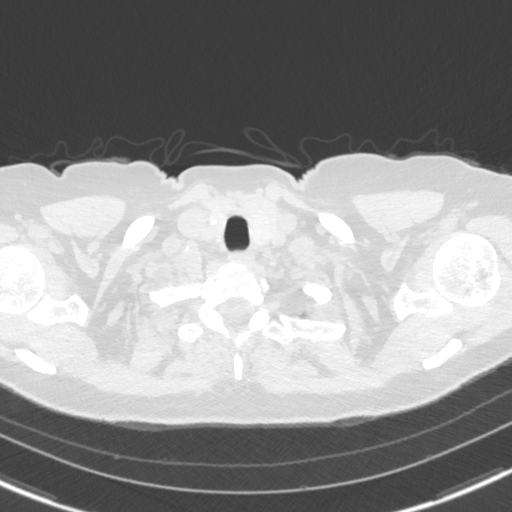

[Series 5: coronal · coronal · 0.64mm/px · 3 of 93 slices shown]
[im 19/93  lung]
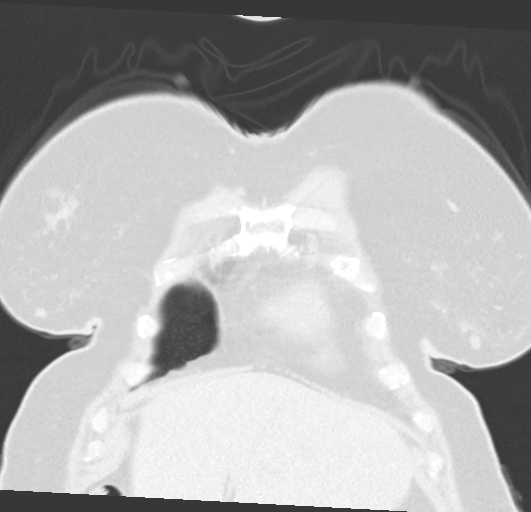
[im 37/93  lung]
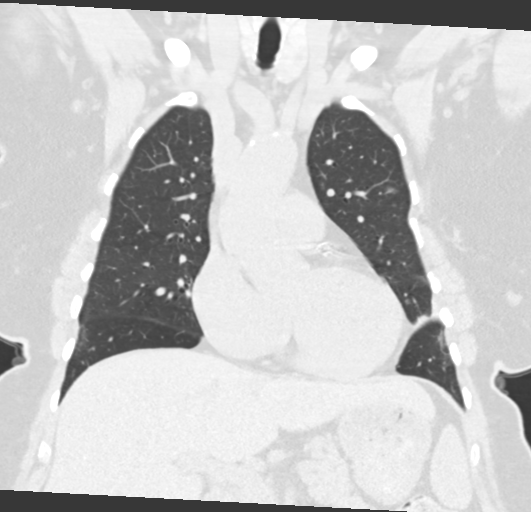
[im 56/93  lung]
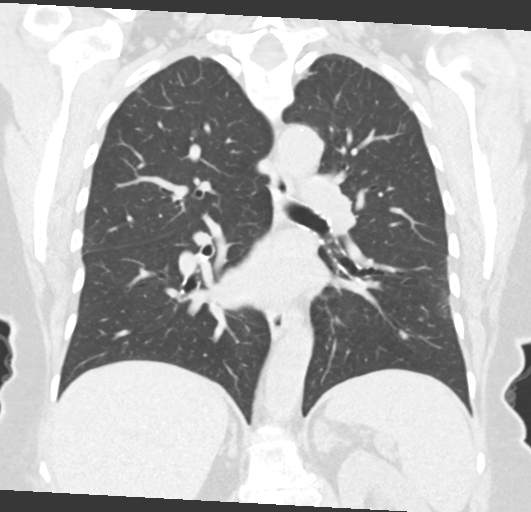

[15 of 36 positions shown; findings below may reference images not displayed]

FINDINGS: Cardiovascular: Heart size is mildly enlarged. There is no
significant pericardial fluid, thickening or pericardial
calcification. There is aortic atherosclerosis, as well as
atherosclerosis of the great vessels of the mediastinum and the
coronary arteries, including calcified atherosclerotic plaque in the
left main, left anterior descending, left circumflex and right
coronary arteries.

Mediastinum/Nodes: No pathologically enlarged mediastinal or hilar
lymph nodes. Please note that accurate exclusion of hilar adenopathy
is limited on noncontrast CT scans. Esophagus is unremarkable in
appearance. No axillary lymphadenopathy.

Lungs/Pleura: Again noted are multiple pulmonary nodules scattered
throughout the lungs bilaterally which are similar number compared
to the prior examination. The largest of these is a mixed
ground-glass attenuation and solid lesion in the posterior aspect of
the right upper lobe (axial image 59 of series 3) which currently
measures approximately 1.8 x 1.8 cm with a central solid component
of 8 mm. This lesion in particular remains concerning for slow
growing pulmonary neoplasm such as a primary bronchogenic
adenocarcinoma. No acute consolidative airspace disease. No pleural
effusions.

Upper Abdomen: Aortic atherosclerosis. Nodularity in the left
adrenal gland stable compared to prior examinations dating back to
CT the abdomen and pelvis [DATE], favored to be benign
(potentially lipid poor adenomas), largest of which measures 1.6 x
1.2 cm (axial image 136 of series 2).

Musculoskeletal: There are no aggressive appearing lytic or blastic
lesions noted in the visualized portions of the skeleton.
IMPRESSION: 1. Multiple pulmonary nodules again noted, demonstrating some slow
interval growth over prior examinations, with the largest of these
in the posterior aspect of the right upper lobe currently measuring
1.8 x 1.8 cm with a central solid component of 8 mm, highly
concerning for a primary bronchogenic adenocarcinoma. Given the
persistence of numerous other ground-glass attenuation nodules,
multicentric disease is suspected. Further evaluation with PET-CT is
suggested at this time.
2. Aortic atherosclerosis, in addition to left main and 3 vessel
coronary artery disease. Please note that although the presence of
coronary artery calcium documents the presence of coronary artery
disease, the severity of this disease and any potential stenosis
cannot be assessed on this non-gated CT examination. Assessment for
potential risk factor modification, dietary therapy or pharmacologic
therapy may be warranted, if clinically indicated.
3. Mild cardiomegaly.

Aortic Atherosclerosis ([63]-[63]).

## 2021-03-20 ENCOUNTER — Telehealth: Payer: Self-pay | Admitting: Pulmonary Disease

## 2021-03-20 ENCOUNTER — Other Ambulatory Visit: Payer: Self-pay

## 2021-03-20 ENCOUNTER — Other Ambulatory Visit (HOSPITAL_COMMUNITY)
Admission: RE | Admit: 2021-03-20 | Discharge: 2021-03-20 | Disposition: A | Discharge status: Home / Self Care | Payer: Medicare HMO | Source: Ambulatory Visit | Attending: Pulmonary Disease | Admitting: Pulmonary Disease

## 2021-03-20 ENCOUNTER — Encounter (HOSPITAL_COMMUNITY): Payer: Self-pay | Admitting: Pulmonary Disease

## 2021-03-20 DIAGNOSIS — Z01812 Encounter for preprocedural laboratory examination: Secondary | ICD-10-CM | POA: Diagnosis present

## 2021-03-20 DIAGNOSIS — Z20822 Contact with and (suspected) exposure to covid-19: Secondary | ICD-10-CM | POA: Diagnosis not present

## 2021-03-20 LAB — SARS CORONAVIRUS 2 (TAT 6-24 HRS): SARS Coronavirus 2: NEGATIVE

## 2021-03-20 NOTE — Progress Notes (Signed)
Anesthesia Chart Review: Same-day work-up  PMH significant for paroxysmal atrial fibrillation, chronic systolic CHF with cardiomyopathy, hypertension, hypercholesterolemia, diabetes mellitus type 2 (last A1c in Care Everywhere 6.6 on 10/20/2020), obesity. The patient's most recent echocardiogram from 2017 revealed normal LV systolic function with estimated EF of >60%.  Followed by cardiologist Dr. Clayborn Bigness at [facility]  Last seen 11/03/2020.  Per note, she was doing well at that time, stable from cardiac standpoint.  Was discussed that she may need bronchoscopy due to lung nodule.  Per note, EKG at that time revealed sinus rhythm, rate 64.  No changes made to medical management.  Per telephone encounter 01/29/2021 she was cleared to hold Eliquis 5 days prior to bronchoscopy.  Patient will need day of surgery labs and evaluation.  EKG 11/03/2020 (narrative only, Care Everywhere): Normal sinus rhythm.  Cannot rule out anterior infarct, age undetermined.  T wave abnormality, consider inferior ischemia.  Rate 64.  Inferior T wave abnormality also appreciated on EKG 03/05/2020 in epic.  TTE 11/28/2015 (Care Everywhere): INTERPRETATION  NORMAL LEFT VENTRICULAR SYSTOLIC FUNCTION  NORMAL RIGHT VENTRICULAR SYSTOLIC FUNCTION  MILD VALVULAR REGURGITATION (See above)  NO VALVULAR STENOSIS  Ejection Fraction > 60%    Wynonia Musty [facility] Short Stay Center/Anesthesiology Phone [phone] 03/20/2021 4:38 PM

## 2021-03-20 NOTE — Progress Notes (Signed)
PCP - Dr. Kary Kos Cardiologist - Dr. Lujean Amel EKG - DOS Chest x-ray -  ECHO -  Cardiac Cath -  CPAP -   Fasting Blood Sugar:  110-120 Checks Blood Sugar:  2/day  Blood Thinner Instructions: per pt last dose Eliquis 7/19 Aspirin Instructions:   ERAS Protcol - n/a  COVID TEST- 7/22 pending result   Anesthesia review: yes  -------------  SDW INSTRUCTIONS:  Your procedure is scheduled on Monday 7/25. Please report to [facility] Main Entrance "A" at 0645 A.M., and check in at the Admitting office. Call this number if you have problems the morning of surgery: [phone]   Remember: Do not eat or drink after midnight the night before your surgery   Medications to take morning of surgery with a sip of water include: acetaminophen (TYLENOL)  if needed amiodarone (PACERONE) carvedilol (COREG) digoxin (LANOXIN)  ** PLEASE check your blood sugar the morning of your surgery when you wake up and every 2 hours until you get to the Short Stay unit.  If your blood sugar is less than 70 mg/dL, you will need to treat for low blood sugar: Do not take insulin. Treat a low blood sugar (less than 70 mg/dL) with  cup of clear juice (cranberry or apple), 4 glucose tablets, OR glucose gel. Recheck blood sugar in 15 minutes after treatment (to make sure it is greater than 70 mg/dL). If your blood sugar is not greater than 70 mg/dL on recheck, call [phone] for further instructions.  NO ORAL diabetic medication morning of surgery   As of today, STOP taking any Aspirin (unless otherwise instructed by your surgeon), Aleve, Naproxen, Ibuprofen, Motrin, Advil, Goody's, BC's, all herbal medications, fish oil, and all vitamins.    The Morning of Surgery Do not wear jewelry, make-up or nail polish. Do not wear lotions, powders, or perfumes, or deodorant Do not shave 48 hours prior to surgery.   Do not bring valuables to the hospital. [facility] is not responsible for any belongings or  valuables.  If you are a smoker, DO NOT Smoke 24 hours prior to surgery  If you wear a CPAP at night please bring your mask the morning of surgery   Remember that you must have someone to transport you home after your surgery, and remain with you for 24 hours if you are discharged the same day.  Please bring cases for contacts, glasses, hearing aids, dentures or bridgework because it cannot be worn into surgery.   Patients discharged the day of surgery will not be allowed to drive home.   Please shower the NIGHT BEFORE/MORNING OF SURGERY (use antibacterial soap like DIAL soap if possible). Wear comfortable clothes the morning of surgery. Oral Hygiene is also important to reduce your risk of infection.  Remember - BRUSH YOUR TEETH THE MORNING OF SURGERY WITH YOUR REGULAR TOOTHPASTE  Patient denies shortness of breath, fever, cough and chest pain.

## 2021-03-20 NOTE — Telephone Encounter (Signed)
Called and spoke with patient. She stated that she was given her bronch instructions early last month but she had missplaced them. I reminded her of the appt time and when she needed to be at [facility]. Also reminded her to be NPO after midnight and she can resume her medications after her procedure. She is aware that she will need a driver after the procedure. She completed her CT on 03/16/21 and covid test today.    She verbalized understanding. Nothing further needed at time of call.

## 2021-03-20 NOTE — Anesthesia Preprocedure Evaluation (Addendum)
Anesthesia Evaluation  Patient identified by MRN, date of birth, ID band Patient awake    Reviewed: Allergy & Precautions, H&P , NPO status , Patient's Chart, lab work & pertinent test results  Airway Mallampati: II   Neck ROM: full    Dental   Pulmonary former smoker,  Pulmonary nodules   breath sounds clear to auscultation       Cardiovascular hypertension, + dysrhythmias Atrial Fibrillation  Rhythm:irregular Rate:Normal     Neuro/Psych    GI/Hepatic   Endo/Other  diabetes, Type 2  Renal/GU      Musculoskeletal  (+) Arthritis ,   Abdominal   Peds  Hematology   Anesthesia Other Findings   Reproductive/Obstetrics                            Anesthesia Physical Anesthesia Plan  ASA: 3  Anesthesia Plan: General   Post-op Pain Management:    Induction: Intravenous  PONV Risk Score and Plan: 3 and Ondansetron, Dexamethasone and Treatment may vary due to age or medical condition  Airway Management Planned: Oral ETT  Additional Equipment:   Intra-op Plan:   Post-operative Plan: Extubation in OR  Informed Consent: I have reviewed the patients History and Physical, chart, labs and discussed the procedure including the risks, benefits and alternatives for the proposed anesthesia with the patient or authorized representative who has indicated his/her understanding and acceptance.     Dental advisory given  Plan Discussed with: CRNA, Anesthesiologist and Surgeon  Anesthesia Plan Comments: (PAT note by Karoline Caldwell, PA-C: PMH significant for paroxysmal atrial fibrillation, chronic systolic CHF with cardiomyopathy, hypertension, hypercholesterolemia, diabetes mellitus type 2 (last A1c in Care Everywhere 6.6 on 10/20/2020), obesity. The patient's most recent echocardiogram from 2017 revealed normal LV systolic function with estimated EF of >60%.  Followed by cardiologist Dr. Clayborn Bigness at [facility].   Last seen 11/03/2020.  Per note, she was doing well at that time, stable from cardiac standpoint.  Was discussed that she may need bronchoscopy due to lung nodule.  Per note, EKG at that time revealed sinus rhythm, rate 64.  No changes made to medical management.  Per telephone encounter 01/29/2021 she was cleared to hold Eliquis 5 days prior to bronchoscopy.  Patient will need day of surgery labs and evaluation.  EKG 11/03/2020 (narrative only, Care Everywhere): Normal sinus rhythm.  Cannot rule out anterior infarct, age undetermined.  T wave abnormality, consider inferior ischemia.  Rate 64.  Inferior T wave abnormality also appreciated on EKG 03/05/2020 in epic.  TTE 11/28/2015 (Care Everywhere): INTERPRETATION  NORMAL LEFT VENTRICULAR SYSTOLIC FUNCTION  NORMAL RIGHT VENTRICULAR SYSTOLIC FUNCTION  MILD VALVULAR REGURGITATION (See above)  NO VALVULAR STENOSIS  Ejection Fraction > 60%   )       Anesthesia Quick Evaluation

## 2021-03-23 ENCOUNTER — Ambulatory Visit (HOSPITAL_COMMUNITY): Payer: Medicare HMO | Admitting: Physician Assistant

## 2021-03-23 ENCOUNTER — Ambulatory Visit (HOSPITAL_COMMUNITY): Payer: Medicare HMO

## 2021-03-23 ENCOUNTER — Ambulatory Visit (HOSPITAL_COMMUNITY)
Admission: RE | Admit: 2021-03-23 | Discharge: 2021-03-23 | Disposition: A | Discharge status: Home / Self Care | Payer: Medicare HMO | Attending: Pulmonary Disease | Admitting: Pulmonary Disease

## 2021-03-23 ENCOUNTER — Encounter (HOSPITAL_COMMUNITY)
Admission: RE | Disposition: A | Discharge status: Home / Self Care | Payer: Self-pay | Source: Home / Self Care | Attending: Pulmonary Disease

## 2021-03-23 ENCOUNTER — Other Ambulatory Visit: Payer: Self-pay

## 2021-03-23 ENCOUNTER — Encounter (HOSPITAL_COMMUNITY): Payer: Self-pay | Admitting: Pulmonary Disease

## 2021-03-23 DIAGNOSIS — I429 Cardiomyopathy, unspecified: Secondary | ICD-10-CM | POA: Diagnosis not present

## 2021-03-23 DIAGNOSIS — Z87891 Personal history of nicotine dependence: Secondary | ICD-10-CM | POA: Diagnosis not present

## 2021-03-23 DIAGNOSIS — Z7901 Long term (current) use of anticoagulants: Secondary | ICD-10-CM | POA: Insufficient documentation

## 2021-03-23 DIAGNOSIS — Z419 Encounter for procedure for purposes other than remedying health state, unspecified: Secondary | ICD-10-CM

## 2021-03-23 DIAGNOSIS — I110 Hypertensive heart disease with heart failure: Secondary | ICD-10-CM | POA: Insufficient documentation

## 2021-03-23 DIAGNOSIS — I2510 Atherosclerotic heart disease of native coronary artery without angina pectoris: Secondary | ICD-10-CM | POA: Insufficient documentation

## 2021-03-23 DIAGNOSIS — Z79899 Other long term (current) drug therapy: Secondary | ICD-10-CM | POA: Diagnosis not present

## 2021-03-23 DIAGNOSIS — E119 Type 2 diabetes mellitus without complications: Secondary | ICD-10-CM | POA: Diagnosis not present

## 2021-03-23 DIAGNOSIS — R911 Solitary pulmonary nodule: Secondary | ICD-10-CM | POA: Diagnosis not present

## 2021-03-23 DIAGNOSIS — Z7984 Long term (current) use of oral hypoglycemic drugs: Secondary | ICD-10-CM | POA: Diagnosis not present

## 2021-03-23 DIAGNOSIS — I509 Heart failure, unspecified: Secondary | ICD-10-CM | POA: Insufficient documentation

## 2021-03-23 DIAGNOSIS — I440 Atrioventricular block, first degree: Secondary | ICD-10-CM | POA: Diagnosis not present

## 2021-03-23 DIAGNOSIS — R918 Other nonspecific abnormal finding of lung field: Secondary | ICD-10-CM | POA: Insufficient documentation

## 2021-03-23 DIAGNOSIS — C3411 Malignant neoplasm of upper lobe, right bronchus or lung: Secondary | ICD-10-CM | POA: Diagnosis present

## 2021-03-23 DIAGNOSIS — Z9889 Other specified postprocedural states: Secondary | ICD-10-CM

## 2021-03-23 DIAGNOSIS — I4891 Unspecified atrial fibrillation: Secondary | ICD-10-CM | POA: Insufficient documentation

## 2021-03-23 DIAGNOSIS — E785 Hyperlipidemia, unspecified: Secondary | ICD-10-CM | POA: Insufficient documentation

## 2021-03-23 HISTORY — PX: FIDUCIAL MARKER PLACEMENT: SHX6858

## 2021-03-23 HISTORY — PX: BRONCHIAL WASHINGS: SHX5105

## 2021-03-23 HISTORY — PX: VIDEO BRONCHOSCOPY WITH RADIAL ENDOBRONCHIAL ULTRASOUND: SHX6849

## 2021-03-23 HISTORY — PX: BRONCHIAL BIOPSY: SHX5109

## 2021-03-23 HISTORY — PX: BRONCHIAL BRUSHINGS: SHX5108

## 2021-03-23 HISTORY — PX: VIDEO BRONCHOSCOPY WITH ENDOBRONCHIAL NAVIGATION: SHX6175

## 2021-03-23 LAB — BASIC METABOLIC PANEL
Anion gap: 9 (ref 5–15)
BUN: 21 mg/dL (ref 8–23)
CO2: 23 mmol/L (ref 22–32)
Calcium: 9.2 mg/dL (ref 8.9–10.3)
Chloride: 108 mmol/L (ref 98–111)
Creatinine, Ser: 0.69 mg/dL (ref 0.44–1.00)
GFR, Estimated: >60 mL/min (ref >60)
Glucose, Bld: 121 mg/dL — ABNORMAL HIGH (ref 70–99)
Potassium: 4 mmol/L (ref 3.5–5.1)
Sodium: 140 mmol/L (ref 135–145)

## 2021-03-23 LAB — CBC
HCT: 39.5 % (ref 36.0–46.0)
Hemoglobin: 12.2 g/dL (ref 12.0–15.0)
MCH: 27.5 pg (ref 26.0–34.0)
MCHC: 30.9 g/dL (ref 30.0–36.0)
MCV: 89.2 fL (ref 80.0–100.0)
Platelets: 247 10*3/uL (ref 150–400)
RBC: 4.43 MIL/uL (ref 3.87–5.11)
RDW: 15.3 % (ref 11.5–15.5)
WBC: 7.4 10*3/uL (ref 4.0–10.5)
nRBC: 0 % (ref 0.0–0.2)

## 2021-03-23 LAB — GLUCOSE, CAPILLARY
Glucose-Capillary: 138 mg/dL — ABNORMAL HIGH (ref 70–99)
Glucose-Capillary: 120 mg/dL — ABNORMAL HIGH (ref 70–99)

## 2021-03-23 IMAGING — DX DG CHEST 1V PORT
1 series · 1 of 1 positions shown · non-contrast
Comparison: Chest CT [DATE]

CLINICAL DATA: Status post bronchoscopy with biopsy.

EXAM:
PORTABLE CHEST 1 VIEW

[chest ap]
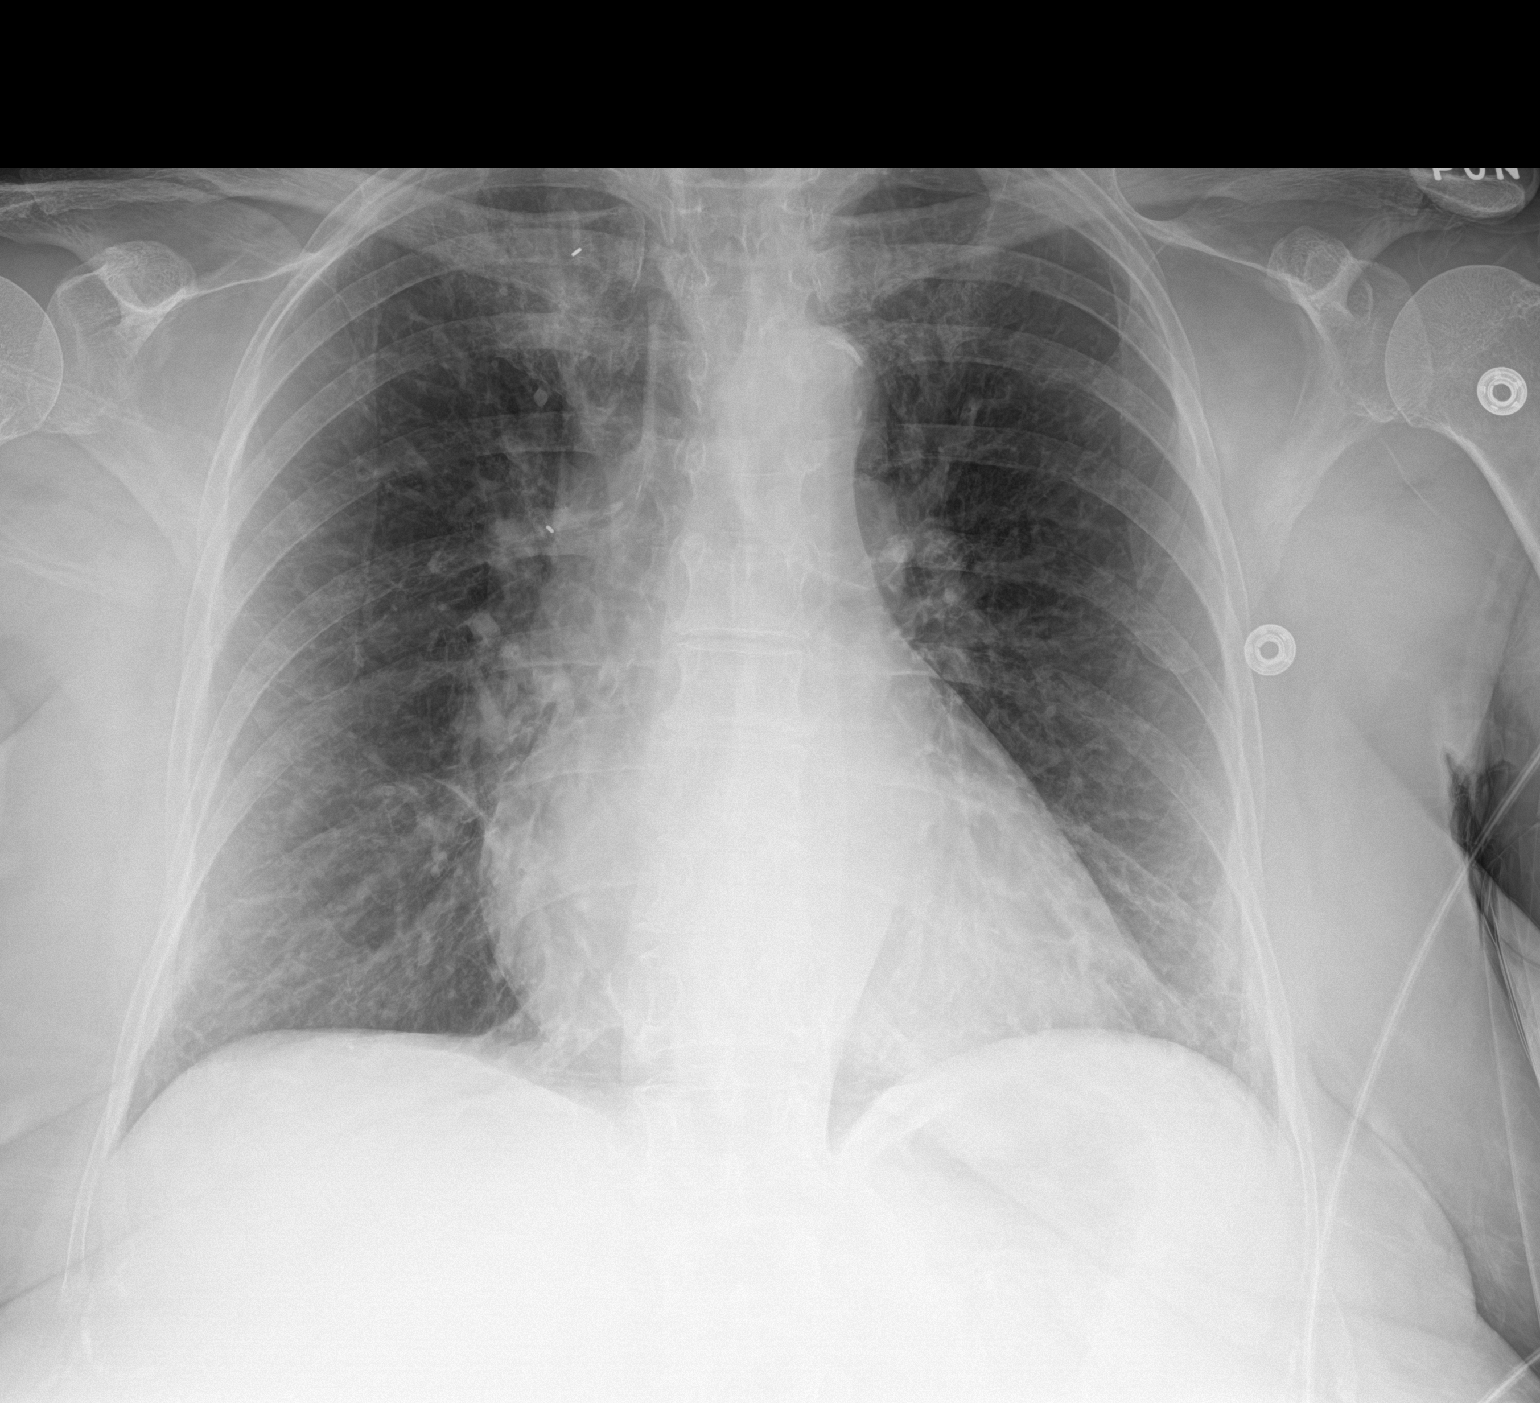

[1 of 1 positions shown; findings below may reference images not displayed]

FINDINGS: The cardiomediastinal silhouette is within normal limits for
portable AP technique. Aortic atherosclerosis is noted. The lungs
are well inflated. The interstitial markings are mildly prominent
without evidence of overt pulmonary edema, airspace consolidation,
sizeable pleural effusion, or pneumothorax. Two fiducials have been
placed in the right upper lobe. No acute osseous abnormality is
seen.
IMPRESSION: No active disease.

## 2021-03-23 SURGERY — VIDEO BRONCHOSCOPY WITH ENDOBRONCHIAL NAVIGATION
Anesthesia: General | Laterality: Right

## 2021-03-23 MED ORDER — FENTANYL CITRATE (PF) 100 MCG/2ML IJ SOLN
INTRAMUSCULAR | Status: DC | PRN
  Administered 2021-03-23: 100 ug via INTRAVENOUS

## 2021-03-23 MED ORDER — SUGAMMADEX SODIUM 200 MG/2ML IV SOLN
INTRAVENOUS | Status: DC | PRN
  Administered 2021-03-23: 200 mg via INTRAVENOUS

## 2021-03-23 MED ORDER — ONDANSETRON HCL 4 MG/2ML IJ SOLN
4.0000 mg | Freq: Four times a day (QID) | INTRAMUSCULAR | Status: DC | PRN
Start: 2021-03-23 — End: 2021-03-23

## 2021-03-23 MED ORDER — SUCCINYLCHOLINE CHLORIDE 200 MG/10ML IV SOSY
PREFILLED_SYRINGE | INTRAVENOUS | Status: DC | PRN
  Administered 2021-03-23: 120 mg via INTRAVENOUS

## 2021-03-23 MED ORDER — LIDOCAINE 2% (20 MG/ML) 5 ML SYRINGE
INTRAMUSCULAR | Status: DC | PRN
  Administered 2021-03-23: 60 mg via INTRAVENOUS

## 2021-03-23 MED ORDER — ONDANSETRON HCL 4 MG/2ML IJ SOLN
INTRAMUSCULAR | Status: DC | PRN
  Administered 2021-03-23: 4 mg via INTRAVENOUS

## 2021-03-23 MED ORDER — LACTATED RINGERS IV SOLN: INTRAVENOUS | Status: DC

## 2021-03-23 MED ORDER — EPHEDRINE SULFATE-NACL 50-0.9 MG/10ML-% IV SOSY
PREFILLED_SYRINGE | INTRAVENOUS | Status: DC | PRN
  Administered 2021-03-23 (×3): 10 mg via INTRAVENOUS

## 2021-03-23 MED ORDER — MIDAZOLAM HCL 2 MG/2ML IJ SOLN
INTRAMUSCULAR | Status: DC | PRN
  Administered 2021-03-23: 2 mg via INTRAVENOUS

## 2021-03-23 MED ORDER — CHLORHEXIDINE GLUCONATE 0.12 % MT SOLN
OROMUCOSAL | Status: AC
  Administered 2021-03-23: 15 mL
  Filled 2021-03-23: qty 15

## 2021-03-23 MED ORDER — DEXAMETHASONE SODIUM PHOSPHATE 10 MG/ML IJ SOLN
INTRAMUSCULAR | Status: DC | PRN
  Administered 2021-03-23: 5 mg via INTRAVENOUS

## 2021-03-23 MED ORDER — ROCURONIUM BROMIDE 10 MG/ML (PF) SYRINGE
PREFILLED_SYRINGE | INTRAVENOUS | Status: DC | PRN
  Administered 2021-03-23: 20 mg via INTRAVENOUS
  Administered 2021-03-23: 40 mg via INTRAVENOUS

## 2021-03-23 MED ORDER — PROPOFOL 10 MG/ML IV BOLUS
INTRAVENOUS | Status: DC | PRN
  Administered 2021-03-23: 140 mg via INTRAVENOUS

## 2021-03-23 SURGICAL SUPPLY — 45 items
ADAPTER BRONCH F/PENTAX (ADAPTER) ×3 IMPLANT
ADAPTER VALVE BIOPSY EBUS (MISCELLANEOUS) IMPLANT
ADPTR VALVE BIOPSY EBUS (MISCELLANEOUS)
BRUSH CYTOL CELLEBRITY 1.5X140 (MISCELLANEOUS) ×3 IMPLANT
BRUSH SUPERTRAX BIOPSY (INSTRUMENTS) IMPLANT
BRUSH SUPERTRAX NDL-TIP CYTO (INSTRUMENTS) ×3 IMPLANT
CANISTER SUCT 3000ML PPV (MISCELLANEOUS) ×3 IMPLANT
CHANNEL WORK EXTEND EDGE 180 (KITS) IMPLANT
CHANNEL WORK EXTEND EDGE 45 (KITS) IMPLANT
CHANNEL WORK EXTEND EDGE 90 (KITS) IMPLANT
CONT SPEC 4OZ CLIKSEAL STRL BL (MISCELLANEOUS) ×3 IMPLANT
COVER BACK TABLE 60X90IN (DRAPES) ×3 IMPLANT
FILTER STRAW FLUID ASPIR (MISCELLANEOUS) IMPLANT
FORCEPS BIOP SUPERTRX PREMAR (INSTRUMENTS) ×3 IMPLANT
GAUZE SPONGE 4X4 12PLY STRL (GAUZE/BANDAGES/DRESSINGS) ×3 IMPLANT
GLOVE SURG SS PI 7.5 STRL IVOR (GLOVE) ×6 IMPLANT
GOWN STRL REUS W/ TWL LRG LVL3 (GOWN DISPOSABLE) ×4 IMPLANT
GOWN STRL REUS W/TWL LRG LVL3 (GOWN DISPOSABLE) ×2
KIT CLEAN ENDO COMPLIANCE (KITS) ×3 IMPLANT
KIT LOCATABLE GUIDE (CANNULA) IMPLANT
KIT MARKER FIDUCIAL DELIVERY (KITS) IMPLANT
KIT PROCEDURE EDGE 180 (KITS) IMPLANT
KIT PROCEDURE EDGE 45 (KITS) IMPLANT
KIT PROCEDURE EDGE 90 (KITS) IMPLANT
KIT TURNOVER KIT B (KITS) ×3 IMPLANT
MARKER SKIN DUAL TIP RULER LAB (MISCELLANEOUS) ×3 IMPLANT
NEEDLE SUPERTRX PREMARK BIOPSY (NEEDLE) ×3 IMPLANT
NS IRRIG 1000ML POUR BTL (IV SOLUTION) ×3 IMPLANT
OIL SILICONE PENTAX (PARTS (SERVICE/REPAIRS)) ×3 IMPLANT
PAD ARMBOARD 7.5X6 YLW CONV (MISCELLANEOUS) ×6 IMPLANT
PATCHES PATIENT (LABEL) ×9 IMPLANT
SOL ANTI FOG 6CC (MISCELLANEOUS) ×2 IMPLANT
SOLUTION ANTI FOG 6CC (MISCELLANEOUS) ×1
SYR 20CC LL (SYRINGE) ×3 IMPLANT
SYR 20ML ECCENTRIC (SYRINGE) ×3 IMPLANT
SYR 50ML SLIP (SYRINGE) ×3 IMPLANT
TOWEL OR 17X24 6PK STRL BLUE (TOWEL DISPOSABLE) ×3 IMPLANT
TRAP SPECIMEN MUCOUS 40CC (MISCELLANEOUS) IMPLANT
TUBE CONNECTING 20X1/4 (TUBING) ×3 IMPLANT
UNDERPAD 30X30 (UNDERPADS AND DIAPERS) ×3 IMPLANT
VALVE BIOPSY  SINGLE USE (MISCELLANEOUS) ×1
VALVE BIOPSY SINGLE USE (MISCELLANEOUS) ×2 IMPLANT
VALVE SUCTION BRONCHIO DISP (MISCELLANEOUS) ×3 IMPLANT
WATER STERILE IRR 1000ML POUR (IV SOLUTION) ×3 IMPLANT
fiducial marker ×3 IMPLANT

## 2021-03-23 NOTE — Interval H&P Note (Signed)
History and Physical Interval Note:  03/23/2021 8:17 AM  Dorothy Gross  has presented today for surgery, with the diagnosis of Lung nodules.  The various methods of treatment have been discussed with the patient and family. After consideration of risks, benefits and other options for treatment, the patient has consented to  Procedure(s) with comments: [place] (Right) - ION Case  as a surgical intervention.  The patient's history has been reviewed, patient examined, no change in status, stable for surgery.  I have reviewed the patient's chart and labs.  Questions were answered to the patient's satisfaction.     [place]

## 2021-03-23 NOTE — Op Note (Addendum)
Video Bronchoscopy with Robotic Assisted Bronchoscopic Navigation   Date of Operation: 03/23/2021  Pre-op Diagnosis: Bilateral lung nodules  Post-op Diagnosis: Bilateral lung nodules  Surgeon: Garner Nash, DO   Assistants: None   Anesthesia: General endotracheal anesthesia  Operation: Flexible video fiberoptic bronchoscopy with robotic assistance and biopsies.  Estimated Blood Loss: Minimal  Complications: None  Indications and History: Dorothy Gross is a 70 y.o. female with history of bilateral lung nodules. The risks, benefits, complications, treatment options and expected outcomes were discussed with the patient.  The possibilities of pneumothorax, pneumonia, reaction to medication, pulmonary aspiration, perforation of a viscus, bleeding, failure to diagnose a condition and creating a complication requiring transfusion or operation were discussed with the patient who freely signed the consent.    Description of Procedure: The patient was seen in the Preoperative Area, was examined and was deemed appropriate to proceed.  The patient was taken to [facility] endoscopy room [facility] endoscopy room 2, identified as [facility] A [place] and the procedure verified as Flexible Video Fiberoptic Bronchoscopy.  A Time Out was held and the above information confirmed.   Prior to the date of the procedure a high-resolution CT scan of the chest was performed. Utilizing ION software program a virtual tracheobronchial tree was generated to allow the creation of distinct navigation pathways to the patient's parenchymal abnormalities. After being taken to the operating room general anesthesia was initiated and the patient  was orally intubated. The video fiberoptic bronchoscope was introduced via the endotracheal tube and a general inspection was performed which showed normal right and left lung anatomy, aspiration of the bilateral mainstems was completed to remove any remaining secretions. Robotic catheter  inserted into patient's endotracheal tube.   Target #1 right upper lobe apical: The distinct navigation pathways prepared prior to this procedure were then utilized to navigate to within 4 mm of patient's lesion(s) identified on CT scan. The robotic catheter was secured into place and the vision probe was withdrawn. Under fluoroscopic guidance transbronchial needle brushings, transbronchial Wang needle biopsies, and transbronchial forceps biopsies were performed to be sent for cytology and pathology.  Following tissue sampling a single fiducial was placed under fluoroscopic guidance using fiducial catheter and wire delivery kit  Target #2 right upper lobe: The distinct navigation pathways prepared prior to this procedure were then utilized to navigate to within 3 mm of patient's lesion(s) identified on CT scan. The robotic catheter was secured into place and the vision probe was withdrawn.  Location of lesion was approximated using radial endobronchial ultrasound. Under fluoroscopic guidance transbronchial needle brushings, transbronchial Wang needle biopsies, and transbronchial forceps biopsies were performed to be sent for cytology and pathology. A bronchioalveolar lavage was performed in the right upper lobe and sent for cytology.  Following tissue sampling a single fiducial was placed under fluoroscopic guidance using fiducial catheter and wire delivery kit  At the end of the procedure a general airway inspection was performed and there was no evidence of active bleeding. The bronchoscope was removed.  The patient tolerated the procedure well. There was no significant blood loss and there were no obvious complications. A post-procedural chest x-ray is pending.  Samples Target #1: 1. Transbronchial needle brushings from right upper lobe apical 2. Transbronchial Wang needle biopsies from right upper lobe apical 3. Transbronchial forceps biopsies from right upper lobe apical  Samples Target #1: 1.  Transbronchial needle brushings from right upper lobe 2. Transbronchial Wang needle biopsies from right upper lobe 3. Transbronchial forceps biopsies  from right upper lobe 4. Bronchoalveolar lavage from right upper lobe  Plans:  The patient will be discharged from the PACU to home when recovered from anesthesia and after chest x-ray is reviewed. We will review the cytology, pathology and microbiology results with the patient when they become available. Outpatient followup will be with Octavio Graves Kebin Maye, DO.

## 2021-03-23 NOTE — Discharge Instructions (Signed)
Flexible Bronchoscopy, Care After This sheet gives you information about how to care for yourself after your test. Your doctor may also give you more specific instructions. If you have problems or questions, contact your doctor. Follow these instructions at home: Eating and drinking Do not eat or drink anything (not even water) for 2 hours after your test, or until your numbing medicine (local anesthetic) wears off. When your numbness is gone and your cough and gag reflexes have come back, you may: Eat only soft foods. Slowly drink liquids. The day after the test, go back to your normal diet. Driving Do not drive for 24 hours if you were given a medicine to help you relax (sedative). Do not drive or use heavy machinery while taking prescription pain medicine. General instructions  Take over-the-counter and prescription medicines only as told by your doctor. Return to your normal activities as told. Ask what activities are safe for you. Do not use any products that have nicotine or tobacco in them. This includes cigarettes and e-cigarettes. If you need help quitting, ask your doctor. Keep all follow-up visits as told by your doctor. This is important. It is very important if you had a tissue sample (biopsy) taken. Get help right away if: You have shortness of breath that gets worse. You get light-headed. You feel like you are going to pass out (faint). You have chest pain. You cough up: More than a little blood. More blood than before. Summary Do not eat or drink anything (not even water) for 2 hours after your test, or until your numbing medicine wears off. Do not use cigarettes. Do not use e-cigarettes. Get help right away if you have chest pain.  This information is not intended to replace advice given to you by your health care provider. Make sure you discuss any questions you have with your health care provider. Document Released: 06/13/2009 Document Revised: 07/29/2017 Document  Reviewed: 09/03/2016 Elsevier Patient Education  2020 [employer].

## 2021-03-23 NOTE — H&P (Signed)
Synopsis: Referred in December 2021 for lung nodule by [place] Pink, MD   Subjective:    PATIENT ID: Dorothy Gross GENDER: female DOB: 1951-01-10, MRN: [id number]       Chief Complaint  Patient presents with   Follow-up      Lung nodule      70 year old female past medical history of atrial fibrillation, CAD, cardiomyopathy (?viral CM ~7 years ago), type 2 diabetes, hypertension, hyperlipidemia.  Patient referred in November following a CT abdomen and pelvis on 07/25/2020 which revealed stable bilateral adrenal nodules, new 4 mm right lower lobe pulmonary nodule which recommended follow-up.  Patient has no respiratory complaints today.  She states this entire story started with a diagnosis of kidney stones in which she had adrenal nodules found.  She had follow-up imaging of the adrenal nodules which revealed the abnormality on her CT scan.  Overall she currently works for [employer].  She is worked there for 47 years.  She is a former smoker.  She is smoked on and off since that about the age of 71.  At her heaviest was a half a pack a day.  There is several years in between which she quit at the time that she was pregnant and restarted after each pregnancy.  She quit after her last child and ultimately restarted smoking after her divorce.  She finally quit smoking in 2009.   OV 01/29/2021: Here today for follow-up regarding recent CT scan of the chest.  CT scan of the chest on 01/20/2021 multiple bilateral upper and midlung zone predominant groundglass nodules the largest within the posterior right upper lobe.  Multifocal adenocarcinoma cannot be excluded.  Patient has peripheral and basilar predominant subpleural densities as well concerning for possible underlying NSIP.  Patient has no respiratory complaints today.         Past Medical History:  Diagnosis Date   A-fib [facility])     Arthritis     CAD (coronary artery disease)     Cardiomyopathy ([place])     CHF (congestive  heart failure) (HCC)     Diabetes mellitus without complication ([place])     History of kidney stones     Hyperlipidemia     Hypertension             Family History  Problem Relation Age of Onset   Diabetes Mother     Diabetes Sister     Diabetes Maternal Aunt     Breast cancer Neg Hx             Past Surgical History:  Procedure Laterality Date   ABDOMINAL HYSTERECTOMY       APPENDECTOMY       BREAST EXCISIONAL BIOPSY Right [id number]    negative 1974 negative 2015   CHOLECYSTECTOMY N/A 12/05/2015    Procedure: LAPAROSCOPIC CHOLECYSTECTOMY WITH INTRAOPERATIVE CHOLANGIOGRAM;  Surgeon: Leonie Green, MD;  Location: [facility] ORS;  Service: General;  Laterality: N/A;   COLONOSCOPY WITH PROPOFOL N/A 10/18/2016    Procedure: COLONOSCOPY WITH PROPOFOL;  Surgeon: Manya Silvas, MD;  Location: [place];  Service: Endoscopy;  Laterality: N/A;   COLONOSCOPY WITH PROPOFOL N/A 03/17/2018    Procedure: COLONOSCOPY WITH PROPOFOL;  Surgeon: Manya Silvas, MD;  Location: [facility] ENDOSCOPY;  Service: Endoscopy;  Laterality: N/A;   CYSTOSCOPY W/ RETROGRADES Bilateral 03/07/2020    Procedure: CYSTOSCOPY WITH RETROGRADE PYELOGRAM;  Surgeon: Billey Co, MD;  Location: [facility] ORS;  Service: Urology;  Laterality: Bilateral;  CYSTOSCOPY/URETEROSCOPY/HOLMIUM LASER/STENT PLACEMENT Bilateral 03/07/2020    Procedure: CYSTOSCOPY/URETEROSCOPY/HOLMIUM LASER/STENT PLACEMENT;  Surgeon: Billey Co, MD;  Location: [facility] ORS;  Service: Urology;  Laterality: Bilateral;   JOINT REPLACEMENT        both knees   REPLACEMENT TOTAL KNEE BILATERAL       TONSILLECTOMY          Social History         Socioeconomic History   Marital status: Divorced      Spouse name: Not on file   Number of children: Not on file   Years of education: Not on file   Highest education level: Not on file  Occupational History   Not on file  Tobacco Use   Smoking status: Former Smoker      Packs/day: 0.25      Years:  15.00      Pack years: 3.75      Types: Cigarettes      Quit date: 08/31/2007      Years since quitting: 13.4   Smokeless tobacco: Never Used  Scientific laboratory technician Use: Never used  Substance and Sexual Activity   Alcohol use: No      Alcohol/week: 0.0 standard drinks   Drug use: No   Sexual activity: Not on file  Other Topics Concern   Not on file  Social History Narrative   Not on file    Social Determinants of Health    Financial Resource Strain: Not on file  Food Insecurity: Not on file  Transportation Needs: Not on file  Physical Activity: Not on file  Stress: Not on file  Social Connections: Not on file  Intimate Partner Violence: Not on file      No Known Allergies          Outpatient Medications Prior to Visit  Medication Sig Dispense Refill   amiodarone (PACERONE) 200 MG tablet Take 200 mg by mouth daily.       apixaban (ELIQUIS) 5 MG TABS tablet Take 5 mg by mouth 2 (two) times daily.       Biotin 1000 MCG tablet Take 1,000 mcg by mouth daily.       calcium carbonate (OS-CAL - DOSED IN MG OF ELEMENTAL CALCIUM) 1250 (500 Ca) MG tablet Take 2 tablets by mouth daily with breakfast.       carvedilol (COREG) 6.25 MG tablet Take 6.25 mg by mouth 2 (two) times daily with a meal.       Cholecalciferol (VITAMIN D3) 1000 units CAPS Take 1,000 Units by mouth daily.       cyanocobalamin 1000 MCG tablet Take 1,000 mcg by mouth daily.       digoxin (LANOXIN) 0.125 MG tablet Take 0.125 mg by mouth daily.       ferrous sulfate 325 (65 FE) MG tablet Take 325 mg by mouth daily with breakfast.       FIBER SELECT GUMMIES PO Take 2 tablets by mouth daily.       Flaxseed, Linseed, (FLAX SEED OIL PO) Take 2 capsules by mouth daily.       fluticasone (FLONASE) 50 MCG/ACT nasal spray Place 1 spray into both nostrils daily as needed for allergies.       furosemide (LASIX) 40 MG tablet Take 40 mg by mouth daily.       glucose blood (ONETOUCH ULTRA) test strip USE 1 EACH (1 STRIP TOTAL) 3  (THREE) TIMES DAILY       Lancets MISC  losartan (COZAAR) 50 MG tablet Take 50 mg by mouth daily.       metFORMIN (GLUCOPHAGE-XR) 500 MG 24 hr tablet Take 1,000 mg by mouth every evening.       Multiple Vitamins-Minerals (MULTIVITAMIN ADULT PO) Take 1 tablet by mouth daily.       rosuvastatin (CRESTOR) 10 MG tablet Take 10 mg by mouth daily.       zinc gluconate 50 MG tablet Take 50 mg by mouth daily.        No facility-administered medications prior to visit.      Review of Systems  Constitutional:  Negative for chills, fever, malaise/fatigue and weight loss.  HENT:  Negative for hearing loss, sore throat and tinnitus.   Eyes:  Negative for blurred vision and double vision.  Respiratory:  Negative for cough, hemoptysis, sputum production, shortness of breath, wheezing and stridor.   Cardiovascular:  Negative for chest pain, palpitations, orthopnea, leg swelling and PND.  Gastrointestinal:  Negative for abdominal pain, constipation, diarrhea, heartburn, nausea and vomiting.  Genitourinary:  Negative for dysuria, hematuria and urgency.  Musculoskeletal:  Negative for joint pain and myalgias.  Skin:  Negative for itching and rash.  Neurological:  Negative for dizziness, tingling, weakness and headaches.  Endo/Heme/Allergies:  Negative for environmental allergies. Does not bruise/bleed easily.  Psychiatric/Behavioral:  Negative for depression. The patient is not nervous/anxious and does not have insomnia.   All other systems reviewed and are negative.      Objective:   General appearance: 70 y.o., female, NAD, conversant  Eyes: anicteric sclerae, moist conjunctivae; no lid-lag; PERRLA, tracking appropriately HENT: NCAT; oropharynx, MMM, no mucosal ulcerations; normal hard and soft palate Neck: Trachea midline; FROM, supple, lymphadenopathy, no JVD Lungs: CTAB, no crackles, no wheeze, with normal respiratory effort and no intercostal retractions CV: RRR, S1, S2, no MRGs   Abdomen: Soft, non-tender; non-distended, BS present  Extremities: No peripheral edema, radial and DP pulses present bilaterally  Skin: Normal temperature, turgor and texture; no rash Psych: Appropriate affect Neuro: Alert and oriented to person and place, no focal deficit      BP (!) 154/46   Pulse 66   Temp (!) 97.5 F (36.4 C) (Oral)   Resp 18   Ht 5\' 5"  (1.651 m)   Wt 88.5 kg   SpO2 94%   BMI 32.45 kg/m       BMI Readings from Last 3 Encounters:  01/29/21 33.64 kg/m  11/24/20 33.21 kg/m  08/11/20 34.33 kg/m       Wt Readings from Last 3 Encounters:  01/29/21 196 lb (88.9 kg)  11/24/20 193 lb 8 oz (87.8 kg)  08/11/20 200 lb (90.7 kg)        CBC Labs (Brief)          Component Value Date/Time    WBC 8.8 03/05/2020 0827    RBC 4.88 03/05/2020 0827    HGB 13.6 03/05/2020 0827    HGB 14.4 02/10/2013 0555    HCT 42.3 03/05/2020 0827    HCT 43.8 02/10/2013 0555    PLT 298 03/05/2020 0827    PLT 345 02/10/2013 0555    MCV 86.7 03/05/2020 0827    MCV 83 02/10/2013 0555    MCH 27.9 03/05/2020 0827    MCHC 32.2 03/05/2020 0827    RDW 15.6 (H) 03/05/2020 0827    RDW 14.5 02/10/2013 0555    LYMPHSABS 3.2 02/10/2013 0555    MONOABS 0.8 02/10/2013 0555    EOSABS 0.1  02/10/2013 0555    BASOSABS 0.1 02/10/2013 0555          Chest Imaging: CT Abd Pelvis Small 55mm RLL pulmonary nodule The patient's images have been independently reviewed by me.     09/25/2020 CT chest: 13mm groundglass nodule within the right upper lobe other small groundglass lesions identified. The patient's images have been independently reviewed by me.     01/20/2021: Super D CT chest: Persistent bilateral groundglass nodules upper and midlung zone predominant.  Multifocal adenocarcinoma not excluded. The patient's images have been independently reviewed by me.     Pulmonary Functions Testing Results: No flowsheet data found.   FeNO:    Pathology:    Echocardiogram:    Heart  Catheterization:     Assessment & Plan:        ICD-10-CM    1. Lung nodule R91.1 Ambulatory referral to Pulmonology      CT Super D Chest Wo Contrast      Procedural/ Surgical Case Request: VIDEO BRONCHOSCOPY WITH ENDOBRONCHIAL NAVIGATION  2. Ground glass opacity present on imaging of lung R91.8 Ambulatory referral to Pulmonology      CT Super D Chest Wo Contrast      Procedural/ Surgical Case Request: VIDEO BRONCHOSCOPY WITH ENDOBRONCHIAL NAVIGATION  3. Former smoker Z87.891 Ambulatory referral to Pulmonology      CT Super D Chest Wo Contrast  4. Multiple pulmonary nodules R91.8 Ambulatory referral to Pulmonology      CT Super D Chest Wo Contrast      Procedural/ Surgical Case Request: VIDEO BRONCHOSCOPY WITH ENDOBRONCHIAL NAVIGATION      Discussion:   This is a 70 year old female, here for follow-up after CT imaging for bilateral groundglass pulmonary nodules.  Initial nodules were found incidentally.  She is a former smoker, she does not have a greater than 20-pack-year history and was not qualify for lung cancer screening screening at the time.  She quit smoking in 2009.   Plan: Discussed risk benefits and alternatives of navigational bronchoscopy  Plans for RAB today  Patient is agreeable to proceed.    Garner Nash, DO [facility] 03/23/2021 8:16 AM

## 2021-03-23 NOTE — Anesthesia Procedure Notes (Signed)
Procedure Name: Intubation Date/Time: 03/23/2021 10:57 AM Performed by: Moshe Salisbury, CRNA Pre-anesthesia Checklist: Patient identified, Emergency Drugs available, Suction available and Patient being monitored Patient Re-evaluated:Patient Re-evaluated prior to induction Oxygen Delivery Method: Circle System Utilized Preoxygenation: Pre-oxygenation with 100% oxygen Induction Type: IV induction Ventilation: Mask ventilation without difficulty Laryngoscope Size: Mac and 3 Grade View: Grade II Tube type: Oral Tube size: 9.0 mm Number of attempts: 1 Airway Equipment and Method: Stylet Placement Confirmation: ETT inserted through vocal cords under direct vision, positive ETCO2 and breath sounds checked- equal and bilateral Secured at: 20 cm Tube secured with: Tape Dental Injury: Teeth and Oropharynx as per pre-operative assessment

## 2021-03-23 NOTE — Transfer of Care (Signed)
Immediate Anesthesia Transfer of Care Note  Patient: Dorothy Gross  Procedure(s) Performed: VIDEO BRONCHOSCOPY WITH ENDOBRONCHIAL NAVIGATION (Right) BRONCHIAL BRUSHINGS BRONCHIAL BIOPSIES FIDUCIAL MARKER PLACEMENT VIDEO BRONCHOSCOPY WITH RADIAL ENDOBRONCHIAL ULTRASOUND BRONCHIAL WASHINGS  Patient Location: PACU  Anesthesia Type:General  Level of Consciousness: drowsy and patient cooperative  Airway & Oxygen Therapy: Patient Spontanous Breathing and Patient connected to nasal cannula oxygen  Post-op Assessment: Report given to RN and Post -op Vital signs reviewed and stable  Post vital signs: Reviewed and stable  Last Vitals:  Vitals Value Taken Time  BP 154/58 03/23/21 1253  Temp    Pulse 74 03/23/21 1254  Resp 16 03/23/21 1254  SpO2 96 % 03/23/21 1254  Vitals shown include unvalidated device data.  Last Pain:  Vitals:   03/23/21 0652  TempSrc: Oral         Complications: No notable events documented.

## 2021-03-24 NOTE — Anesthesia Postprocedure Evaluation (Signed)
Anesthesia Post Note  Patient: Dorothy Gross  Procedure(s) Performed: VIDEO BRONCHOSCOPY WITH ENDOBRONCHIAL NAVIGATION (Right) BRONCHIAL BRUSHINGS BRONCHIAL BIOPSIES FIDUCIAL MARKER PLACEMENT VIDEO BRONCHOSCOPY WITH RADIAL ENDOBRONCHIAL ULTRASOUND BRONCHIAL WASHINGS     Patient location during evaluation: PACU Anesthesia Type: General Level of consciousness: awake Pain management: pain level controlled Vital Signs Assessment: post-procedure vital signs reviewed and stable Respiratory status: spontaneous breathing Cardiovascular status: stable Postop Assessment: no apparent nausea or vomiting Anesthetic complications: no   No notable events documented.  Last Vitals:  Vitals:   03/23/21 1339 03/23/21 1345  BP:  (!) 136/56  Pulse: 62 62  Resp: 18 13  Temp:    SpO2: 96% 96%    Last Pain:  Vitals:   03/23/21 1330  TempSrc:   PainSc: 0-No pain                 Kailynn Satterly

## 2021-03-25 LAB — CULTURE, BAL-QUANTITATIVE W GRAM STAIN: Culture: NO GROWTH

## 2021-03-25 LAB — ACID FAST SMEAR (AFB, MYCOBACTERIA): Acid Fast Smear: NEGATIVE

## 2021-03-25 LAB — CYTOLOGY - NON PAP

## 2021-03-26 ENCOUNTER — Encounter (HOSPITAL_COMMUNITY): Payer: Self-pay | Admitting: Pulmonary Disease

## 2021-03-28 LAB — AEROBIC/ANAEROBIC CULTURE W GRAM STAIN (SURGICAL/DEEP WOUND): Culture: NO GROWTH

## 2021-03-30 ENCOUNTER — Telehealth: Payer: Self-pay | Admitting: Pulmonary Disease

## 2021-03-30 DIAGNOSIS — R911 Solitary pulmonary nodule: Secondary | ICD-10-CM

## 2021-03-30 DIAGNOSIS — R918 Other nonspecific abnormal finding of lung field: Secondary | ICD-10-CM

## 2021-03-30 NOTE — Telephone Encounter (Signed)
Called and spoke with pt who states she had a bronch with biopsy performed 7/25 and said that she has received some of the results via mychart but does not know how to interpret the results.  Pt is wanting to know if we can help relay the results to her. Dr. Valeta Harms, please advise.

## 2021-03-30 NOTE — Addendum Note (Signed)
Addended by: Coralie Keens on: 03/30/2021 04:28 PM   Modules accepted: Orders

## 2021-03-30 NOTE — Telephone Encounter (Signed)
I called and spoke with patient regarding Dr. Oliver Hum. I have canceled appt on 04/17/21 and put in recall for 3 month f/u. I also put in order for CT w/o chest in 3 months before f/u appt. Patient verbalized understanding. Nothing further needed at this time

## 2021-03-30 NOTE — Telephone Encounter (Signed)
PCCM:  I called the patient to discuss results.   All bx results were negative for malignancy.   We will plan for 3 month CT chest and follow up with me afterwards.   Please reschedule appt on 19th and move out to 3 months.   Garner Nash, DO [facility] 03/30/2021 3:25 PM

## 2021-04-17 ENCOUNTER — Ambulatory Visit: Payer: Medicare HMO | Admitting: Pulmonary Disease

## 2021-05-01 LAB — FUNGUS CULTURE WITH STAIN

## 2021-05-01 LAB — FUNGAL ORGANISM REFLEX

## 2021-05-01 LAB — FUNGUS CULTURE RESULT

## 2021-05-07 LAB — ACID FAST CULTURE WITH REFLEXED SENSITIVITIES (MYCOBACTERIA): Acid Fast Culture: NEGATIVE

## 2021-05-14 ENCOUNTER — Other Ambulatory Visit: Payer: Self-pay

## 2021-05-14 DIAGNOSIS — N200 Calculus of kidney: Secondary | ICD-10-CM

## 2021-05-21 ENCOUNTER — Ambulatory Visit: Payer: Self-pay | Admitting: Urology

## 2021-05-27 ENCOUNTER — Other Ambulatory Visit: Payer: Self-pay

## 2021-05-27 ENCOUNTER — Encounter: Payer: Self-pay | Admitting: Urology

## 2021-05-27 ENCOUNTER — Ambulatory Visit
Admission: RE | Admit: 2021-05-27 | Discharge: 2021-05-27 | Disposition: A | Discharge status: Home / Self Care | Payer: Medicare HMO | Source: Ambulatory Visit | Attending: Urology | Admitting: Urology

## 2021-05-27 ENCOUNTER — Ambulatory Visit: Payer: Medicare HMO | Admitting: Urology

## 2021-05-27 VITALS — BP 158/77 | HR 65 | Ht 64.0 in | Wt 198.0 lb

## 2021-05-27 DIAGNOSIS — N200 Calculus of kidney: Secondary | ICD-10-CM | POA: Diagnosis present

## 2021-05-27 DIAGNOSIS — R310 Gross hematuria: Secondary | ICD-10-CM

## 2021-05-27 IMAGING — CR DG ABDOMEN 1V
1 series · 2 of 2 positions shown · non-contrast
Comparison: [DATE]

CLINICAL DATA: Kidney stones

EXAM:
ABDOMEN - 1 VIEW

[Series 1: t abdomen supine · 0.14mm/px · 2 of 2 slices shown]
[im 1/2]
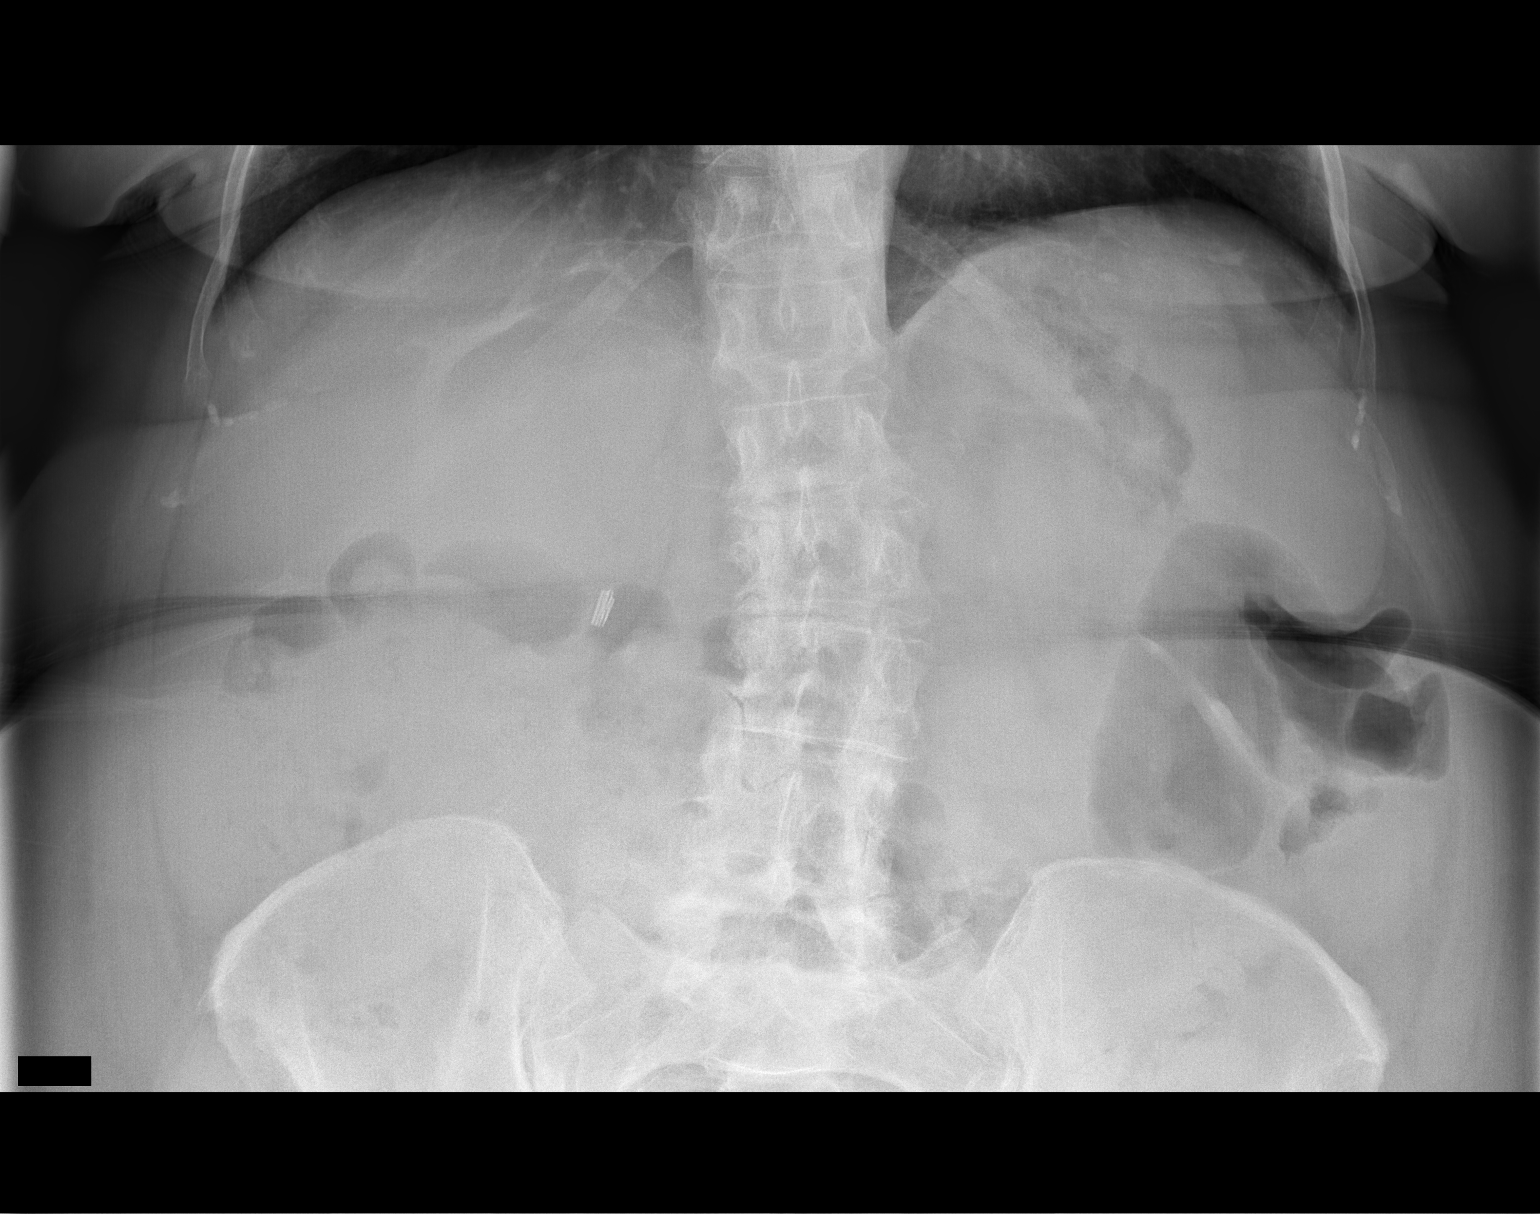
[im 2/2]
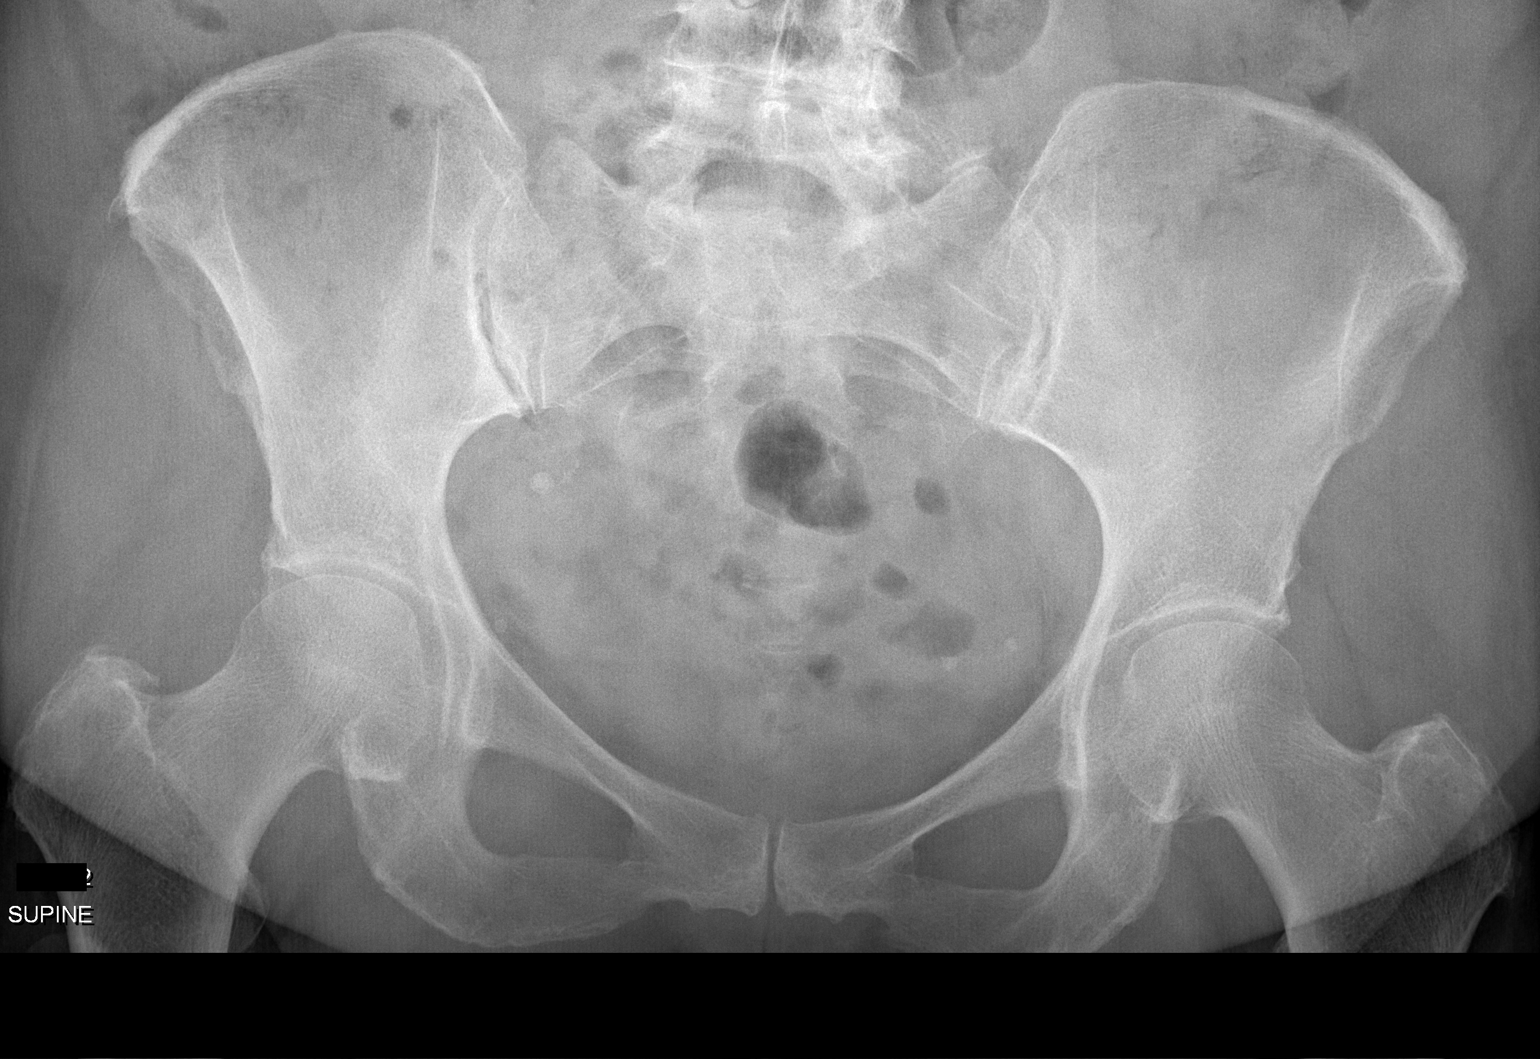

[2 of 2 positions shown; findings below may reference images not displayed]

FINDINGS: Clips in the right upper quadrant. Probable phleboliths in the
pelvis. Nonobstructed gas pattern. No definite radiopaque calculi
over the kidneys. Scoliosis.
IMPRESSION: Negative.

## 2021-05-27 NOTE — Patient Instructions (Signed)
Dietary Guidelines to Help Prevent Kidney Stones Kidney stones are deposits of minerals and salts that form inside your kidneys. Your risk of developing kidney stones may be greater depending on your diet, your lifestyle, the medicines you take, and whether you have certain medical conditions. Most people can lower their chances of developing kidney stones by following the instructions below. Your dietitian may give you more specific instructions depending on your overall health and the type of kidney stones you tend to develop. What are tips for following this plan? Reading food labels  Choose foods with "no salt added" or "low-salt" labels. Limit your salt (sodium) intake to less than 1,500 mg a day. Choose foods with calcium for each meal and snack. Try to eat about 300 mg of calcium at each meal. Foods that contain 200-500 mg of calcium a serving include: 8 oz (237 mL) of milk, calcium-fortifiednon-dairy milk, and calcium-fortifiedfruit juice. Calcium-fortified means that calcium has been added to these drinks. 8 oz (237 mL) of kefir, yogurt, and soy yogurt. 4 oz (114 g) of tofu. 1 oz (28 g) of cheese. 1 cup (150 g) of dried figs. 1 cup (91 g) of cooked broccoli. One 3 oz (85 g) can of sardines or mackerel. Most people need 1,000-1,500 mg of calcium a day. Talk to your dietitian about how much calcium is recommended for you. Shopping Buy plenty of fresh fruits and vegetables. Most people do not need to avoid fruits and vegetables, even if these foods contain nutrients that may contribute to kidney stones. When shopping for convenience foods, choose: Whole pieces of fruit. Pre-made salads with dressing on the side. Low-fat fruit and yogurt smoothies. Avoid buying frozen meals or prepared deli foods. These can be high in sodium. Look for foods with live cultures, such as yogurt and kefir. Choose high-fiber grains, such as whole-wheat breads, oat bran, and wheat cereals. Cooking Do not add  salt to food when cooking. Place a salt shaker on the table and allow each person to add his or her own salt to taste. Use vegetable protein, such as beans, textured vegetable protein (TVP), or tofu, instead of meat in pasta, casseroles, and soups. Meal planning Eat less salt, if told by your dietitian. To do this: Avoid eating processed or pre-made food. Avoid eating fast food. Eat less animal protein, including cheese, meat, poultry, or fish, if told by your dietitian. To do this: Limit the number of times you have meat, poultry, fish, or cheese each week. Eat a diet free of meat at least 2 days a week. Eat only one serving each day of meat, poultry, fish, or seafood. When you prepare animal protein, cut pieces into small portion sizes. For most meat and fish, one serving is about the size of the palm of your hand. Eat at least five servings of fresh fruits and vegetables each day. To do this: Keep fruits and vegetables on hand for snacks. Eat one piece of fruit or a handful of berries with breakfast. Have a salad and fruit at lunch. Have two kinds of vegetables at dinner. Limit foods that are high in a substance called oxalate. These include: Spinach (cooked), rhubarb, beets, sweet potatoes, and Swiss chard. Peanuts. Potato chips, french fries, and baked potatoes with skin on. Nuts and nut products. Chocolate. If you regularly take a diuretic medicine, make sure to eat at least 1 or 2 servings of fruits or vegetables that are high in potassium each day. These include: Avocado. Banana. Orange, prune,   carrot, or tomato juice. Baked potato. Cabbage. Beans and split peas. Lifestyle  Drink enough fluid to keep your urine pale yellow. This is the most important thing you can do. Spread your fluid intake throughout the day. If you drink alcohol: Limit how much you use to: 0-1 drink a day for women who are not pregnant. 0-2 drinks a day for men. Be aware of how much alcohol is in your  drink. In the [place] one drink equals one 12 oz bottle of beer (355 mL), one 5 oz glass of wine (148 mL), or one 1 oz glass of hard liquor (44 mL). Lose weight if told by your health care provider. Work with your dietitian to find an eating plan and weight loss strategies that work best for you. General information Talk to your health care provider and dietitian about taking daily supplements. You may be told the following depending on your health and the cause of your kidney stones: Not to take supplements with vitamin C. To take a calcium supplement. To take a daily probiotic supplement. To take other supplements such as magnesium, fish oil, or vitamin B6. Take over-the-counter and prescription medicines only as told by your health care provider. These include supplements. What foods should I limit? Limit your intake of the following foods, or eat them as told by your dietitian. Vegetables Spinach. Rhubarb. Beets. Canned vegetables. Pickles. Olives. Baked potatoes with skin. Grains Wheat bran. Baked goods. Salted crackers. Cereals high in sugar. Meats and other proteins Nuts. Nut butters. Large portions of meat, poultry, or fish. Salted, precooked, or cured meats, such as sausages, meat loaves, and hot dogs. Dairy Cheese. Beverages Regular soft drinks. Regular vegetable juice. Seasonings and condiments Seasoning blends with salt. Salad dressings. Soy sauce. Ketchup. Barbecue sauce. Other foods Canned soups. Canned pasta sauce. Casseroles. Pizza. Lasagna. Frozen meals. Potato chips. French fries. The items listed above may not be a complete list of foods and beverages you should limit. Contact a dietitian for more information. What foods should I avoid? Talk to your dietitian about specific foods you should avoid based on the type of kidney stones you have and your overall health. Fruits Grapefruit. The item listed above may not be a complete list of foods and beverages you should  avoid. Contact a dietitian for more information. Summary Kidney stones are deposits of minerals and salts that form inside your kidneys. You can lower your risk of kidney stones by making changes to your diet. The most important thing you can do is drink enough fluid. Drink enough fluid to keep your urine pale yellow. Talk to your dietitian about how much calcium you should have each day, and eat less salt and animal protein as told by your dietitian. This information is not intended to replace advice given to you by your health care provider. Make sure you discuss any questions you have with your health care provider. Document Revised: 08/09/2019 Document Reviewed: 08/09/2019 Elsevier Patient Education  2022 [employer] Inc[employer]  

## 2021-05-27 NOTE — Progress Notes (Signed)
   05/27/2021 8:58 AM   Dorothy Gross [birth year] [id number]  Reason for visit: Follow up gross hematuria, ureteral thickening, nephrolithiasis  HPI: 70 year old female who underwent a gross hematuria work-up last year and had abnormal ureteral thickening enhancement on the left side on CT, as well as bilateral moderate renal stone burden. She underwent an uncomplicated bilateral diagnostic ureteroscopy in July 2021, as well as laser lithotripsy of her stones.  Her bilateral stents have since been removed.  Follow-up renal ultrasound in September 2021 showed no hydronephrosis or nephrolithiasis.  She also had some adrenal thickening and underwent a negative metabolic work-up with endocrinology.  She denies any recurrent gross hematuria or stone episodes over the last year.  I personally viewed and interpreted her KUB today that shows no obvious evidence of stone disease.  We discussed general stone prevention strategies including adequate hydration with goal of producing 2.5 L of urine daily, increasing citric acid intake, increasing calcium intake during high oxalate meals, minimizing animal protein, and decreasing salt intake. Information about dietary recommendations given today.   RTC 1 year with KUB, if doing well at that time likely can follow-up as needed  [place], [place] [address], [place], [place] [address] [phone]

## 2021-05-27 NOTE — Addendum Note (Signed)
Addended by: Despina Hidden on: 05/27/2021 09:06 AM   Modules accepted: Orders

## 2021-07-02 ENCOUNTER — Ambulatory Visit
Admission: RE | Admit: 2021-07-02 | Discharge: 2021-07-02 | Disposition: A | Discharge status: Home / Self Care | Payer: Medicare HMO | Source: Ambulatory Visit | Attending: Pulmonary Disease | Admitting: Pulmonary Disease

## 2021-07-02 ENCOUNTER — Other Ambulatory Visit: Payer: Self-pay

## 2021-07-02 DIAGNOSIS — R918 Other nonspecific abnormal finding of lung field: Secondary | ICD-10-CM

## 2021-07-02 IMAGING — CT CT CHEST SUPER D W/O CM
2 of 5 series · 14 of 36 positions shown, 17 images · non-contrast
Comparison: [DATE]

CLINICAL DATA: Follow-up multiple ground-glass lung nodules, former
smoker

EXAM:
CT CHEST WITHOUT CONTRAST
TECHNIQUE: Multidetector CT imaging of the chest was performed using thin slice
collimation for electromagnetic bronchoscopy planning purposes,
without intravenous contrast.

[Series 4: chest 2.00 br40 s3 · coronal · 0.60mm/px · 3 of 153 slices shown]
[im 31/153  lung]
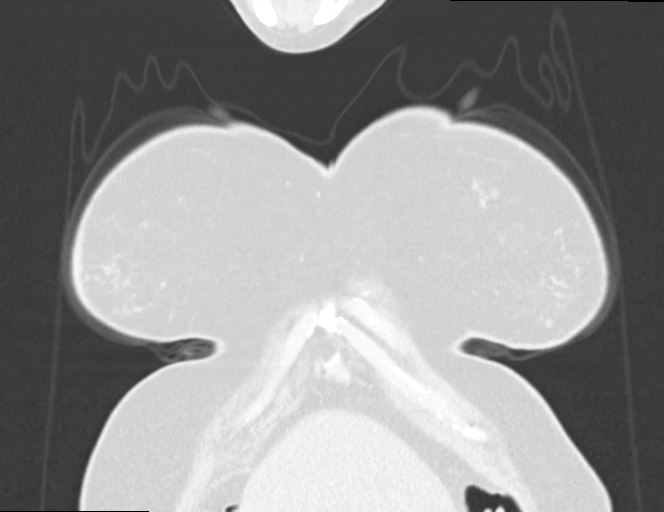
[im 61/153  lung]
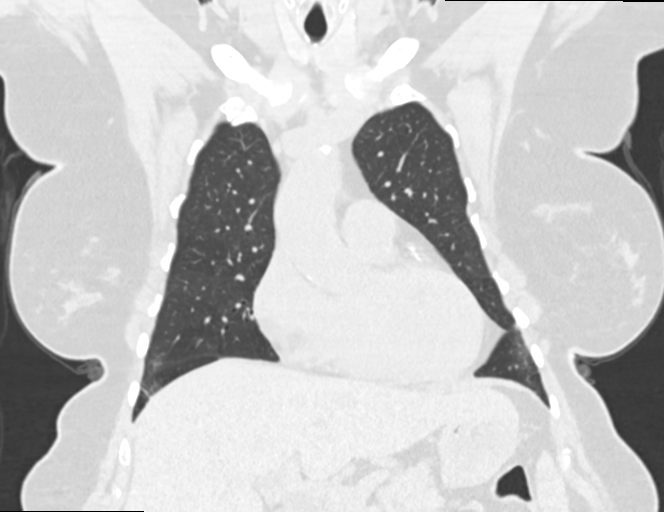
[im 92/153  lung]
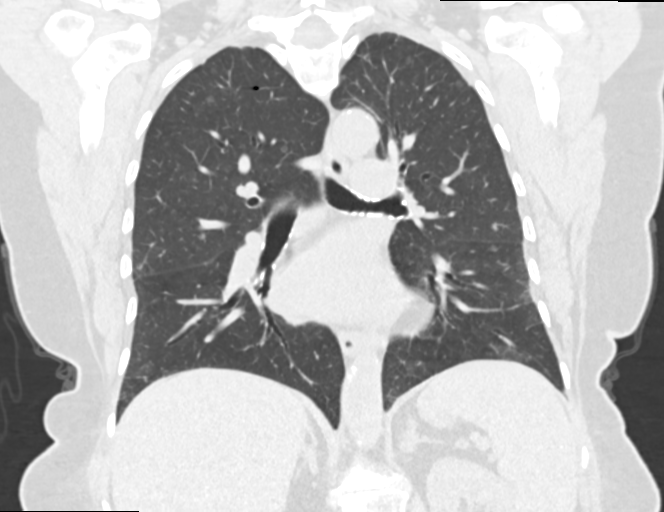

[Series 10: chest 1.00 br40 s3 super d · axial · 0.78mm/px · z∈[+1543,+1808]mm · 11 of 383 slices shown, 14 images]
[im 26/383  mediastinal]
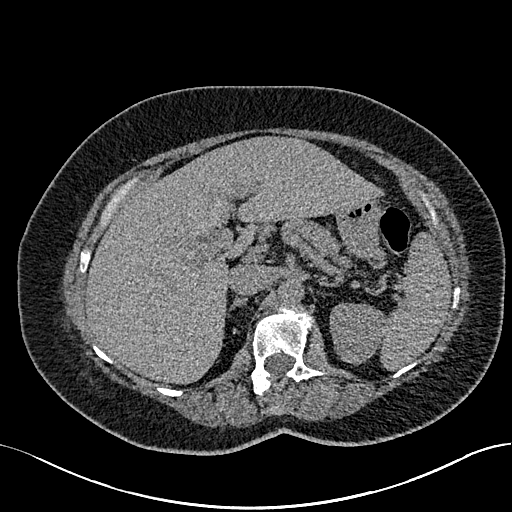
[im 26/383  lung]
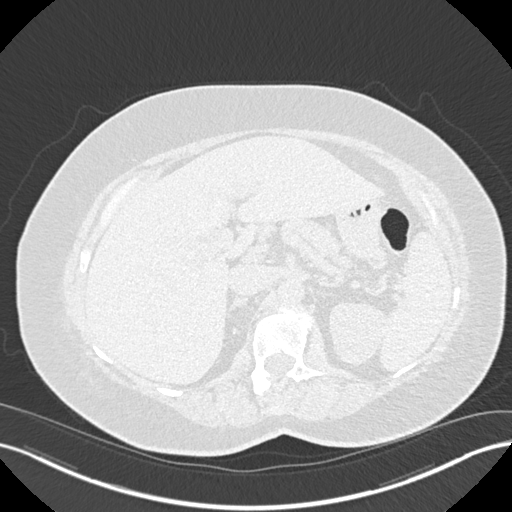
[im 51/383  lung]
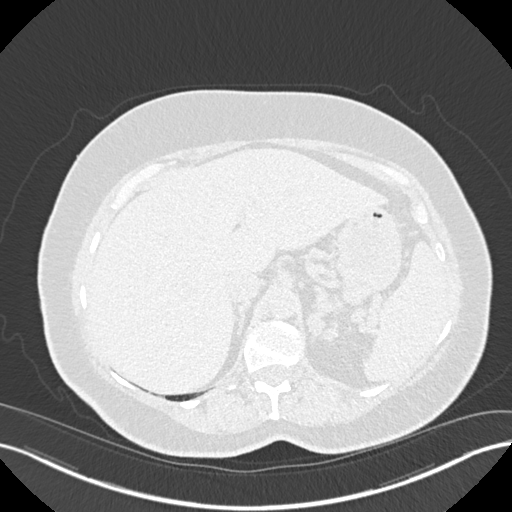
[im 102/383  lung]
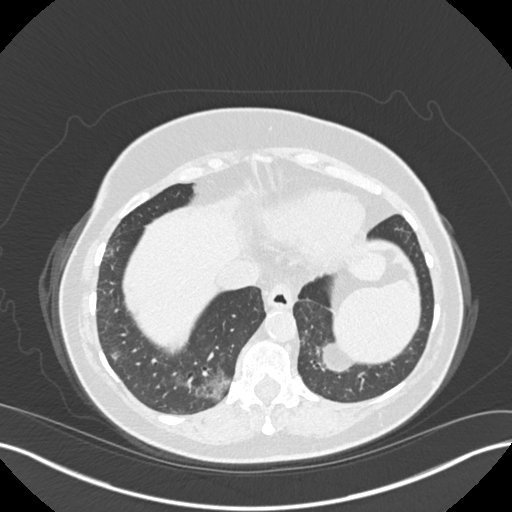
[im 128/383  lung]
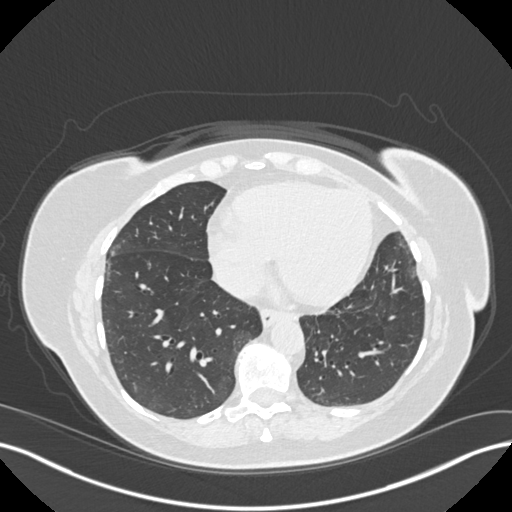
[im 153/383  mediastinal]
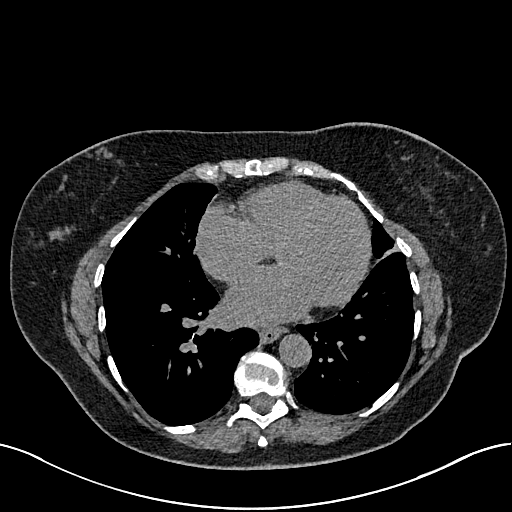
[im 153/383  lung]
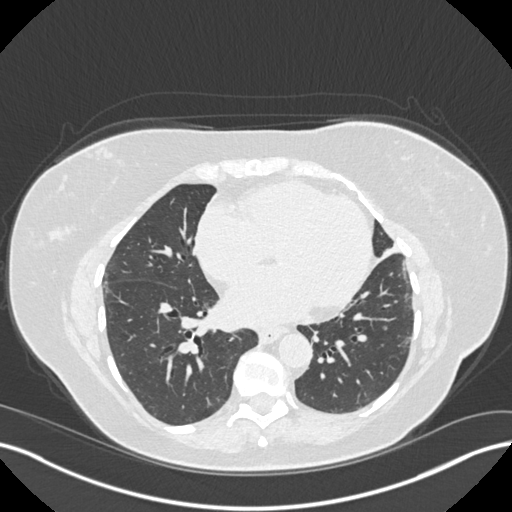
[im 204/383  lung]
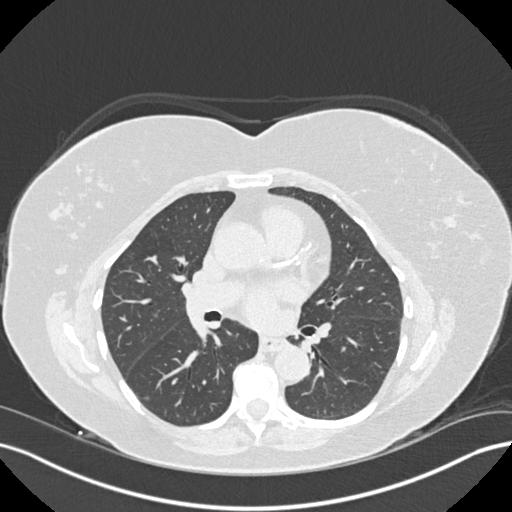
[im 230/383  lung]
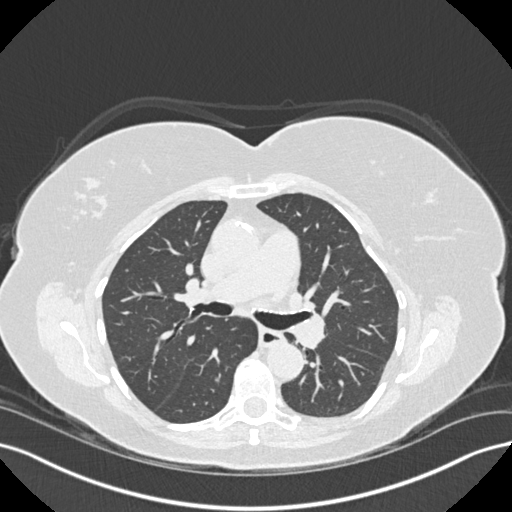
[im 255/383  lung]
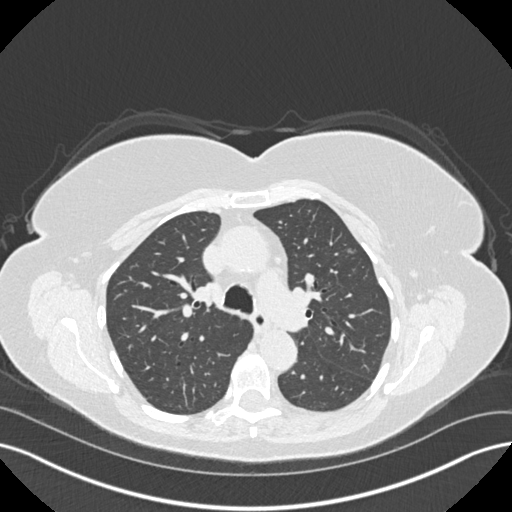
[im 281/383  mediastinal]
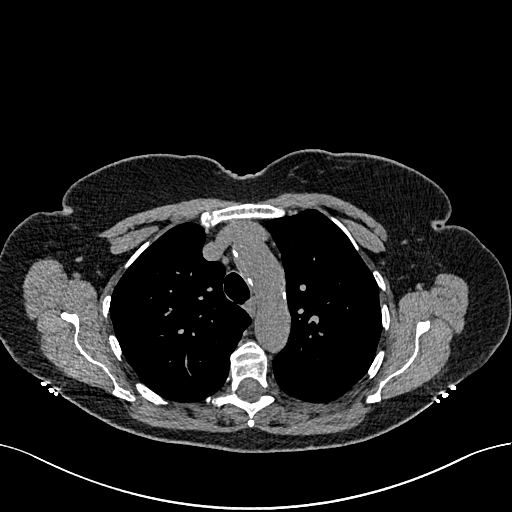
[im 281/383  lung]
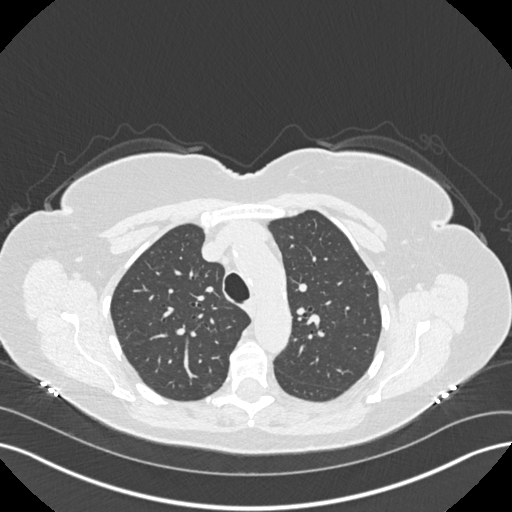
[im 332/383  lung]
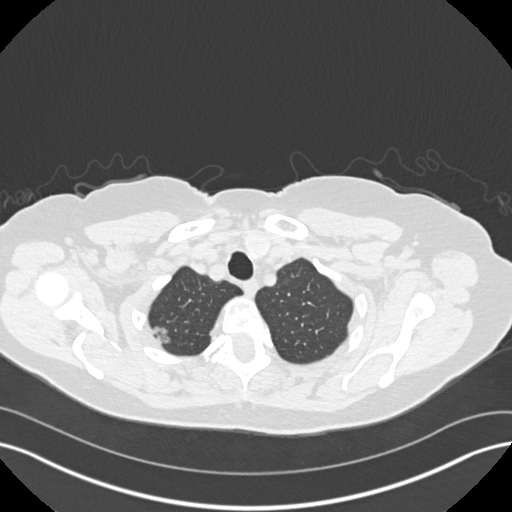
[im 357/383  lung]
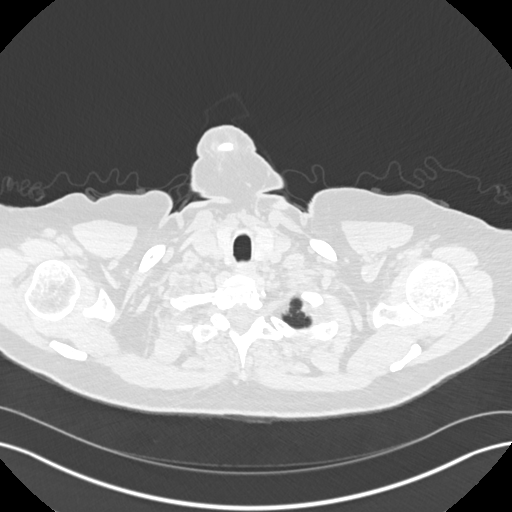

[14 of 36 positions shown; findings below may reference images not displayed]

FINDINGS: Cardiovascular: Aortic atherosclerosis. Normal heart size. Left and
right coronary artery calcifications. No pericardial effusion.

Mediastinum/Nodes: No enlarged mediastinal, hilar, or axillary lymph
nodes. Thyroid gland, trachea, and esophagus demonstrate no
significant findings.

Lungs/Pleura: Interval placement of a fiducial marker in the right
pulmonary apex (series 8, image 27). Multiple ground-glass pulmonary
nodules of varying sizes and solidity are unchanged compared to
prior examination. As on prior examination, the largest and
morphologically most worrisome nodule is a subsolid nodule of the
posterior right upper lobe, which abuts and tents the major fissure,
measuring 1.8 x 1.7 cm (series 8, image 59). An additional subsolid
index nodule of the posterior lateral right apex is unchanged,
measuring 1.5 x 1.1 cm (series 8, image 21). A mixed solid and
ground-glass nodule of the anterior right lower lobe measures 0.8 x
0.6 cm, central nodular component 0.5 cm (series 8, image 88).
Multiple additional small ground-glass nodules throughout the lungs
are unchanged. There is ill-defined ground-glass opacity of the
bilateral lung bases (series 8, image 117). No pleural effusion or
pneumothorax.

Upper Abdomen: No acute abnormality.

Musculoskeletal: No chest wall mass or suspicious bone lesions
identified.
IMPRESSION: 1. Multiple ground-glass pulmonary nodules of varying sizes and
solidity throughout the bilateral lungs are unchanged compared to
prior examination.
2. As on prior examination, the largest and morphologically most
worrisome nodule is a subsolid nodule of the posterior right upper
lobe, which abuts and tents the major fissure, measuring 1.8 x
cm. An additional subsolid index nodule of the posterior lateral
right apex is unchanged, measuring 1.5 x 1.1 cm. A mixed solid and
ground-glass nodule of the anterior right lower lobe measures 0.8 x
0.6 cm, central nodular component 0.5 cm. These nodules remain
particularly concerning for indolent adenocarcinoma. Several
nodules, however have been biopsied bronchoscopically without
evidence of malignancy.
3. Additional smaller, predominantly ground-glass nodules are
nonspecific.
4. There is additional ill-defined ground-glass opacity of the
bilateral lung bases, nonspecific and infectious or inflammatory.
5. Coronary artery disease.

Aortic Atherosclerosis ([KD]-[KD]).

## 2021-07-10 NOTE — Progress Notes (Signed)
Please let patient know I have reviewed her recent CT scan.  Some of her nodules are still persistent no real significant changes but due to some solid nature I think we need to meet and discuss next steps.  Please schedule patient for a follow-up appointment with me.  Next available 15-minute slot would be appropriate to discuss next steps at her convenience.  I do think we need to meet and discussed all the possibilities of what to do regarding her nodules.  Please message me back when this has occurred.  Hopefully sometime within the next 4 weeks or so.  Thanks,  BLI  Garner Nash, DO [facility] 07/10/2021 4:46 PM

## 2021-07-15 ENCOUNTER — Encounter: Payer: Self-pay | Admitting: *Deleted

## 2021-07-20 ENCOUNTER — Ambulatory Visit: Payer: Medicare HMO | Admitting: Pulmonary Disease

## 2021-07-20 ENCOUNTER — Encounter: Payer: Self-pay | Admitting: Pulmonary Disease

## 2021-07-20 ENCOUNTER — Telehealth: Payer: Self-pay | Admitting: Pulmonary Disease

## 2021-07-20 ENCOUNTER — Other Ambulatory Visit: Payer: Self-pay

## 2021-07-20 VITALS — BP 126/80 | HR 70 | Temp 98.0°F | Ht 64.0 in | Wt 198.2 lb

## 2021-07-20 DIAGNOSIS — Z87891 Personal history of nicotine dependence: Secondary | ICD-10-CM

## 2021-07-20 DIAGNOSIS — R918 Other nonspecific abnormal finding of lung field: Secondary | ICD-10-CM | POA: Diagnosis not present

## 2021-07-20 NOTE — Progress Notes (Signed)
Synopsis: Referred in December 2021 for lung nodule by Maryland Pink, MD  Subjective:   PATIENT ID: Dorothy Gross GENDER: female DOB: 12-29-1950, MRN: [id number]  Chief Complaint  Patient presents with   Follow-up    70 year old female past medical history of atrial fibrillation, CAD, cardiomyopathy (?viral CM ~7 years ago), type 2 diabetes, hypertension, hyperlipidemia.  Patient referred in November following a CT abdomen and pelvis on 07/25/2020 which revealed stable bilateral adrenal nodules, new 4 mm right lower lobe pulmonary nodule which recommended follow-up.  Patient has no respiratory complaints today.  She states this entire story started with a diagnosis of kidney stones in which she had adrenal nodules found.  She had follow-up imaging of the adrenal nodules which revealed the abnormality on her CT scan.  Overall she currently works for [employer].  She is worked there for 47 years.  She is a former smoker.  She is smoked on and off since that about the age of 39.  At her heaviest was a half a pack a day.  There is several years in between which she quit at the time that she was pregnant and restarted after each pregnancy.  She quit after her last child and ultimately restarted smoking after her divorce.  She finally quit smoking in 2009.  OV 01/29/2021: Here today for follow-up regarding recent CT scan of the chest.  CT scan of the chest on 01/20/2021 multiple bilateral upper and midlung zone predominant groundglass nodules the largest within the posterior right upper lobe.  Multifocal adenocarcinoma cannot be excluded.  Patient has peripheral and basilar predominant subpleural densities as well concerning for possible underlying NSIP.  Patient has no respiratory complaints today.  OV 07/20/2021: Here today for follow-up after recent CT scan of the chest.  He she has multiple right-sided and other smaller bilateral groundglass some solid lesions within the chest.  She had  bronchoscopy with tissue biopsy.  There was concern for multifocal adenocarcinoma.  All tissue biopsies however were negative for malignancy.  Patient was ultimately sent for repeat CT imaging.  This was done at the beginning of the month.  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  We reviewed them today in the office and discussed all of the following next steps.   Past Medical History:  Diagnosis Date   A-fib [facility])    Arthritis    CAD (coronary artery disease)    Cardiomyopathy ([place])    CHF (congestive heart failure) (HCC)    Diabetes mellitus without complication ([place])    History of kidney stones    Hyperlipidemia    Hypertension    Kidney stone      Family History  Problem Relation Age of Onset   Diabetes Mother    Diabetes Sister    Diabetes Maternal Aunt    Breast cancer Neg Hx      Past Surgical History:  Procedure Laterality Date   ABDOMINAL HYSTERECTOMY     APPENDECTOMY     BREAST EXCISIONAL BIOPSY Right [id number]   negative 1974 negative 2015   BRONCHIAL BIOPSY  03/23/2021   Procedure: BRONCHIAL BIOPSIES;  Surgeon: Garner Nash, DO;  Location: [facility];  Service: Pulmonary;;   BRONCHIAL BRUSHINGS  03/23/2021   Procedure: BRONCHIAL BRUSHINGS;  Surgeon: Garner Nash, DO;  Location: [place] [facility];  Service: Pulmonary;;   BRONCHIAL WASHINGS  03/23/2021   Procedure: BRONCHIAL WASHINGS;  Surgeon: Garner Nash, DO;  Location:  [place] [facility];  Service: Pulmonary;;   CHOLECYSTECTOMY N/A 12/05/2015   Procedure: LAPAROSCOPIC CHOLECYSTECTOMY WITH INTRAOPERATIVE CHOLANGIOGRAM;  Surgeon: Leonie Green, MD;  Location: [facility] ORS;  Service: General;  Laterality: N/A;   COLONOSCOPY WITH PROPOFOL N/A 10/18/2016   Procedure: COLONOSCOPY WITH PROPOFOL;  Surgeon: Manya Silvas, MD;  Location: [facility] ENDOSCOPY;  Service: Endoscopy;  Laterality: N/A;   COLONOSCOPY WITH PROPOFOL N/A 03/17/2018   Procedure:  COLONOSCOPY WITH PROPOFOL;  Surgeon: Manya Silvas, MD;  Location: [facility] ENDOSCOPY;  Service: Endoscopy;  Laterality: N/A;   CYSTOSCOPY W/ RETROGRADES Bilateral 03/07/2020   Procedure: CYSTOSCOPY WITH RETROGRADE PYELOGRAM;  Surgeon: Billey Co, MD;  Location: [facility] ORS;  Service: Urology;  Laterality: Bilateral;   CYSTOSCOPY/URETEROSCOPY/HOLMIUM LASER/STENT PLACEMENT Bilateral 03/07/2020   Procedure: CYSTOSCOPY/URETEROSCOPY/HOLMIUM LASER/STENT PLACEMENT;  Surgeon: Billey Co, MD;  Location: [facility] ORS;  Service: Urology;  Laterality: Bilateral;   FIDUCIAL MARKER PLACEMENT  03/23/2021   Procedure: FIDUCIAL MARKER PLACEMENT;  Surgeon: Garner Nash, DO;  Location: [place] [facility];  Service: Pulmonary;;   JOINT REPLACEMENT     both knees   REPLACEMENT TOTAL KNEE BILATERAL     TONSILLECTOMY     VIDEO BRONCHOSCOPY WITH ENDOBRONCHIAL NAVIGATION Right 03/23/2021   Procedure: VIDEO BRONCHOSCOPY WITH ENDOBRONCHIAL NAVIGATION;  Surgeon: Garner Nash, DO;  Location: [facility];  Service: Pulmonary;  Laterality: Right;  ION Case    VIDEO BRONCHOSCOPY WITH RADIAL ENDOBRONCHIAL ULTRASOUND  03/23/2021   Procedure: VIDEO BRONCHOSCOPY WITH RADIAL ENDOBRONCHIAL ULTRASOUND;  Surgeon: Garner Nash, DO;  Location: [facility] ENDOSCOPY;  Service: Pulmonary;;    Social History   Socioeconomic History   Marital status: Divorced    Spouse name: Not on file   Number of children: Not on file   Years of education: Not on file   Highest education level: Not on file  Occupational History   Not on file  Tobacco Use   Smoking status: Former    Packs/day: 0.25    Years: 15.00    Pack years: 3.75    Types: Cigarettes    Quit date: 08/31/2007    Years since quitting: 13.8   Smokeless tobacco: Never  Vaping Use   Vaping Use: Never used  Substance and Sexual Activity   Alcohol use: No    Alcohol/week: 0.0 standard drinks   Drug use: No   Sexual activity: Not Currently    Birth control/protection:  Post-menopausal  Other Topics Concern   Not on file  Social History Narrative   Not on file   Social Determinants of Health   Financial Resource Strain: Not on file  Food Insecurity: Not on file  Transportation Needs: Not on file  Physical Activity: Not on file  Stress: Not on file  Social Connections: Not on file  Intimate Partner Violence: Not on file     No Known Allergies   Outpatient Medications Prior to Visit  Medication Sig Dispense Refill   acetaminophen (TYLENOL) 500 MG tablet Take 1,000 mg by mouth every 6 (six) hours as needed.     amiodarone (PACERONE) 200 MG tablet Take 200 mg by mouth daily.      apixaban (ELIQUIS) 5 MG TABS tablet Take 5 mg by mouth 2 (two) times daily.      BIOTIN PO Take 1 capsule by mouth daily.     calcium carbonate (OS-CAL - DOSED IN MG OF ELEMENTAL CALCIUM) 1250 (500 Ca) MG tablet Take 1 tablet by mouth daily with breakfast.     carvedilol (  COREG) 6.25 MG tablet Take 6.25 mg by mouth 2 (two) times daily with a meal.      Cholecalciferol (VITAMIN D3) 1000 units CAPS Take 1,000 Units by mouth daily.      cyanocobalamin 1000 MCG tablet Take 1,000 mcg by mouth daily.      digoxin (LANOXIN) 0.125 MG tablet Take 0.125 mg by mouth daily.      ferrous sulfate 325 (65 FE) MG tablet Take 325 mg by mouth every other day.     Flaxseed, Linseed, (FLAX SEED OIL PO) Take 2 capsules by mouth daily.     fluticasone (FLONASE) 50 MCG/ACT nasal spray Place 1 spray into both nostrils daily as needed for allergies.      furosemide (LASIX) 40 MG tablet Take 40 mg by mouth daily.      glucose blood (ONETOUCH ULTRA) test strip USE 1 EACH (1 STRIP TOTAL) 3 (THREE) TIMES DAILY     Lancets MISC      losartan (COZAAR) 25 MG tablet Take 50 mg by mouth daily.     metFORMIN (GLUCOPHAGE-XR) 500 MG 24 hr tablet Take 1,000 mg by mouth daily with supper.     Multiple Vitamins-Minerals (MULTIVITAMIN ADULT PO) Take 1 tablet by mouth daily.     Polyethyl Glycol-Propyl Glycol  (SYSTANE OP) Place 1 drop into both eyes daily as needed (dry eyes).     rosuvastatin (CRESTOR) 10 MG tablet Take 10 mg by mouth daily.      zinc gluconate 50 MG tablet Take 50 mg by mouth daily.      No facility-administered medications prior to visit.    Review of Systems  Constitutional:  Negative for chills, fever, malaise/fatigue and weight loss.  HENT:  Negative for hearing loss, sore throat and tinnitus.   Eyes:  Negative for blurred vision and double vision.  Respiratory:  Negative for cough, hemoptysis, sputum production, shortness of breath, wheezing and stridor.   Cardiovascular:  Negative for chest pain, palpitations, orthopnea, leg swelling and PND.  Gastrointestinal:  Negative for abdominal pain, constipation, diarrhea, heartburn, nausea and vomiting.  Genitourinary:  Negative for dysuria, hematuria and urgency.  Musculoskeletal:  Negative for joint pain and myalgias.  Skin:  Negative for itching and rash.  Neurological:  Negative for dizziness, tingling, weakness and headaches.  Endo/Heme/Allergies:  Negative for environmental allergies. Does not bruise/bleed easily.  Psychiatric/Behavioral:  Negative for depression. The patient is not nervous/anxious and does not have insomnia.   All other systems reviewed and are negative.   Objective:  Physical Exam Vitals reviewed.  Constitutional:      General: She is not in acute distress.    Appearance: She is well-developed.  HENT:     Head: Normocephalic and atraumatic.  Eyes:     General: No scleral icterus.    Conjunctiva/sclera: Conjunctivae normal.     Pupils: Pupils are equal, round, and reactive to light.  Neck:     Vascular: No JVD.     Trachea: No tracheal deviation.  Cardiovascular:     Rate and Rhythm: Normal rate and regular rhythm.     Heart sounds: Normal heart sounds. No murmur heard. Pulmonary:     Effort: Pulmonary effort is normal. No tachypnea, accessory muscle usage or respiratory distress.      Breath sounds: No stridor. No wheezing, rhonchi or rales.  Abdominal:     General: There is no distension.     Palpations: Abdomen is soft.     Tenderness: There is no  abdominal tenderness.  Musculoskeletal:        General: No tenderness.     Cervical back: Neck supple.  Lymphadenopathy:     Cervical: No cervical adenopathy.  Skin:    General: Skin is warm and dry.     Capillary Refill: Capillary refill takes less than 2 seconds.     Findings: No rash.  Neurological:     Mental Status: She is alert and oriented to person, place, and time.  Psychiatric:        Behavior: Behavior normal.     Vitals:   07/20/21 1156  BP: 126/80  Pulse: 70  Temp: 98 F (36.7 C)  TempSrc: Oral  SpO2: 97%  Weight: 198 lb 3.2 oz (89.9 kg)  Height: 5\' 4"  (1.626 m)   97% on RA BMI Readings from Last 3 Encounters:  07/20/21 34.02 kg/m  05/27/21 33.99 kg/m  03/23/21 32.45 kg/m   Wt Readings from Last 3 Encounters:  07/20/21 198 lb 3.2 oz (89.9 kg)  05/27/21 198 lb (89.8 kg)  03/23/21 195 lb (88.5 kg)     CBC    Component Value Date/Time   WBC 7.4 03/23/2021 0738   RBC 4.43 03/23/2021 0738   HGB 12.2 03/23/2021 0738   HGB 14.4 02/10/2013 0555   HCT 39.5 03/23/2021 0738   HCT 43.8 02/10/2013 0555   PLT 247 03/23/2021 0738   PLT 345 02/10/2013 0555   MCV 89.2 03/23/2021 0738   MCV 83 02/10/2013 0555   MCH 27.5 03/23/2021 0738   MCHC 30.9 03/23/2021 0738   RDW 15.3 03/23/2021 0738   RDW 14.5 02/10/2013 0555   LYMPHSABS 3.2 02/10/2013 0555   MONOABS 0.8 02/10/2013 0555   EOSABS 0.1 02/10/2013 0555   BASOSABS 0.1 02/10/2013 0555     Chest Imaging: CT Abd Pelvis Small 80mm RLL pulmonary nodule The patient's images have been independently reviewed by me.    09/25/2020 CT chest: 60mm groundglass nodule within the right upper lobe other small groundglass lesions identified. The patient's images have been independently reviewed by me.    01/20/2021: Super D CT  chest: Persistent bilateral groundglass nodules upper and midlung zone predominant.  Multifocal adenocarcinoma not excluded. The patient's images have been independently reviewed by me.    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 The patient's images have been independently reviewed by me.    Pulmonary Functions Testing Results: No flowsheet data found.  FeNO:   Pathology:   Echocardiogram:   Heart Catheterization:     Assessment & Plan:     ICD-10-CM   1. Multiple lung nodules  R91.8 Procedural/ Surgical Case Request: ROBOTIC ASSISTED NAVIGATIONAL BRONCHOSCOPY    Ambulatory referral to Pulmonology    CT Super D Chest Wo Contrast    2. Ground glass opacity present on imaging of lung  R91.8     3. Former smoker  Z87.891       Discussion:  This is a 70 year old female, here for follow-up of CT imaging with bilateral groundglass of solid lesions.  She is a former smoker quit greater than 20 years ago.  She had incidental nodules and they have slowly been followed over time.  Already underwent 1 recent bronchoscopy with tissue sampling.  Biopsies were negative for malignancy at that time.  Plan: Repeat noncontrasted CT scan reviewed today in the office. 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 Patient is agreeable to proceed with repeat biopsy.  We talked about the importance of having a tissue diagnosis and the concern for potential for malignancy. These lesions are obviously slow-growing as they have relatively unchanged since previous. Therefore the patient would like to wait until after the holidays to have anything done. We will have a repeat bronchoscopy with tissue sampling. Since her repeat biopsy we now have the ability to view images of the lesion with three-dimensional cone beam CT to help prove to [place] with higher accuracy for tissue  biopsy. We also discussed potentially using the 1.1 mm cryotherapy probe for transbronchial cryobiopsies which does increase the risk for pneumothorax and bleeding associated with biopsy however does increase our diagnostic yield. Patient is agreeable for this as well if we decide is necessary.    Current Outpatient Medications:    acetaminophen (TYLENOL) 500 MG tablet, Take 1,000 mg by mouth every 6 (six) hours as needed., Disp: , Rfl:    amiodarone (PACERONE) 200 MG tablet, Take 200 mg by mouth daily. , Disp: , Rfl:    apixaban (ELIQUIS) 5 MG TABS tablet, Take 5 mg by mouth 2 (two) times daily. , Disp: , Rfl:    BIOTIN PO, Take 1 capsule by mouth daily., Disp: , Rfl:    calcium carbonate (OS-CAL - DOSED IN MG OF ELEMENTAL CALCIUM) 1250 (500 Ca) MG tablet, Take 1 tablet by mouth daily with breakfast., Disp: , Rfl:    carvedilol (COREG) 6.25 MG tablet, Take 6.25 mg by mouth 2 (two) times daily with a meal. , Disp: , Rfl:    Cholecalciferol (VITAMIN D3) 1000 units CAPS, Take 1,000 Units by mouth daily. , Disp: , Rfl:    cyanocobalamin 1000 MCG tablet, Take 1,000 mcg by mouth daily. , Disp: , Rfl:    digoxin (LANOXIN) 0.125 MG tablet, Take 0.125 mg by mouth daily. , Disp: , Rfl:    ferrous sulfate 325 (65 FE) MG tablet, Take 325 mg by mouth every other day., Disp: , Rfl:    Flaxseed, Linseed, (FLAX SEED OIL PO), Take 2 capsules by mouth daily., Disp: , Rfl:    fluticasone (FLONASE) 50 MCG/ACT nasal spray, Place 1 spray into both nostrils daily as needed for allergies. , Disp: , Rfl:    furosemide (LASIX) 40 MG tablet, Take 40 mg by mouth daily. , Disp: , Rfl:    glucose blood (ONETOUCH ULTRA) test strip, USE 1 EACH (1 STRIP TOTAL) 3 (THREE) TIMES DAILY, Disp: , Rfl:    Lancets MISC, , Disp: , Rfl:    losartan (COZAAR) 25 MG tablet, Take 50 mg by mouth daily., Disp: , Rfl:    metFORMIN (GLUCOPHAGE-XR) 500 MG 24 hr tablet, Take 1,000 mg by mouth daily with supper., Disp: , Rfl:    Multiple  Vitamins-Minerals (MULTIVITAMIN ADULT PO), Take 1 tablet by mouth daily., Disp: , Rfl:    Polyethyl Glycol-Propyl Glycol (SYSTANE OP), Place 1 drop into both eyes daily as needed (dry eyes)., Disp: , Rfl:    rosuvastatin (CRESTOR) 10 MG tablet, Take 10 mg by mouth daily. , Disp: , Rfl:    zinc gluconate 50 MG tablet, Take 50 mg by mouth daily. , Disp: , Rfl:   I spent 42 minutes dedicated to the care of this patient on the date of this encounter to include pre-visit review of records, face-to-face time with the patient discussing conditions above, post visit ordering of testing, clinical documentation with the electronic health record, making appropriate referrals as documented, and communicating necessary findings to members of  the patients care team.    Garner Nash, DO [facility] 07/20/2021 12:33 PM

## 2021-07-20 NOTE — Patient Instructions (Signed)
  Thank you for visiting Dr. Valeta Harms at [facility]. Today we recommend the following:  Orders Placed This Encounter  Procedures   Procedural/ Surgical Case Request: ROBOTIC ASSISTED NAVIGATIONAL BRONCHOSCOPY   CT Super D Chest Wo Contrast   Ambulatory referral to Pulmonology   Tentative bronchoscopy 09/07/2020 Last dose eliquis Friday 09/04/2020  Return in about 8 weeks (around 09/14/2021) for with Eric Form, NP.    Please do your part to reduce the spread of COVID-19.

## 2021-07-20 NOTE — Telephone Encounter (Signed)
Pt has been scheduled for 1/9 at [facility].  CT to be done at [facility] on 1/6 and will go for covid test on 1/6.  Gave appt info to pt.  Mentioned for her to stop Eliquis on 1/6.  She is going to follow up with cardiologist to see if should stop sooner.

## 2021-08-03 ENCOUNTER — Telehealth: Payer: Self-pay | Admitting: Pulmonary Disease

## 2021-08-04 ENCOUNTER — Other Ambulatory Visit: Payer: Medicare HMO

## 2021-08-04 NOTE — Telephone Encounter (Signed)
Pt CT was denied & cancelled.  I previously left a message for this patient & Nunzio Cobbs from [facility] spoke to the patient today to let her know.  Pt was very confused when speaking to [place].  I left a VM notifying pt that there is NO CT on 08/04/2021 & Dr. Valeta Harms has chosen not to have pt get another one at this time.

## 2021-08-05 ENCOUNTER — Telehealth: Payer: Self-pay | Admitting: Pulmonary Disease

## 2021-08-06 NOTE — Telephone Encounter (Signed)
As noted previously, pt's insurance denied the CT at this time.  Dr. Valeta Harms has chosen not to proceed with a CT at this time.  I have spoken to the patient, left her a message & Taryn from [facility] has also spoken to the patient about this.    I attempted to reach the patient, but she did not answer.  I will try to reach her again tomorrow.

## 2021-08-12 NOTE — Telephone Encounter (Signed)
Pt aware of Dr. Juline Patch recommendations.

## 2021-08-27 ENCOUNTER — Encounter: Payer: Self-pay | Admitting: Urology

## 2021-08-27 NOTE — Telephone Encounter (Signed)
I have rescheduled pt to 1/16 at 9:15 at [facility].  Pt will go for covid test on 1/13 and will need to stop Eliquis on 1/13.  Called pt to give her new info and had to leave vm for her to call me back.  I verified with Larene Beach at [facility] that they already have disc for pt.

## 2021-09-01 NOTE — Telephone Encounter (Signed)
Spoke to pt & gave her new appt info.

## 2021-09-07 DIAGNOSIS — IMO0001 Reserved for inherently not codable concepts without codable children: Secondary | ICD-10-CM | POA: Insufficient documentation

## 2021-09-09 ENCOUNTER — Telehealth: Payer: Self-pay | Admitting: Pulmonary Disease

## 2021-09-09 NOTE — Telephone Encounter (Signed)
Patient is calling because she's having a procedure on 09/14/21 with [facility] but she wants to let him know that her eye doctor has started her on an antibiotic Doxycycline momo 100mg  cap 2x's a day. She can't take on empty stomach has to eat before taking. Wants to know what she should do the day before and day of procedure with [facility]. Please advise

## 2021-09-09 NOTE — Telephone Encounter (Signed)
Dr. Valeta Harms, please advise on the message from pt.

## 2021-09-10 NOTE — Telephone Encounter (Signed)
I called the patient and she voices understanding. Nothing further needed.  °

## 2021-09-11 ENCOUNTER — Other Ambulatory Visit: Payer: Self-pay | Admitting: Pulmonary Disease

## 2021-09-11 ENCOUNTER — Other Ambulatory Visit: Payer: Self-pay

## 2021-09-11 ENCOUNTER — Encounter (HOSPITAL_COMMUNITY): Payer: Self-pay | Admitting: Pulmonary Disease

## 2021-09-11 LAB — SARS CORONAVIRUS 2 (TAT 6-24 HRS): SARS Coronavirus 2: NEGATIVE

## 2021-09-11 NOTE — Progress Notes (Signed)
Spoke with pt for pre-op call. Pt has hx of A-fib and is on Eliquis. She states she was told by her cardiologist to hold Eliquis 5 days prior to the procedure. Last dose was 08/1021 PM dose. Pt's cardiologist is Dr. Clayborn Bigness with [facility]. Pt states she has a hx of CHF but has not had a reoccurrence for 8 years. Pt is a type 2 diabetic. Pt's last A1C was 6.8 on 05/08/21. She does not check a fasting blood sugar.   Covid test done today.

## 2021-09-14 ENCOUNTER — Ambulatory Visit (HOSPITAL_COMMUNITY): Payer: Medicare HMO

## 2021-09-14 ENCOUNTER — Other Ambulatory Visit: Payer: Self-pay

## 2021-09-14 ENCOUNTER — Ambulatory Visit (HOSPITAL_COMMUNITY): Payer: Medicare HMO | Admitting: Anesthesiology

## 2021-09-14 ENCOUNTER — Encounter (HOSPITAL_COMMUNITY): Payer: Self-pay | Admitting: Pulmonary Disease

## 2021-09-14 ENCOUNTER — Encounter (HOSPITAL_COMMUNITY)
Admission: RE | Disposition: A | Discharge status: Home / Self Care | Payer: Self-pay | Source: Home / Self Care | Attending: Pulmonary Disease

## 2021-09-14 ENCOUNTER — Ambulatory Visit (HOSPITAL_COMMUNITY)
Admission: RE | Admit: 2021-09-14 | Discharge: 2021-09-14 | Disposition: A | Discharge status: Home / Self Care | Payer: Medicare HMO | Attending: Pulmonary Disease | Admitting: Pulmonary Disease

## 2021-09-14 ENCOUNTER — Ambulatory Visit: Payer: Medicare HMO | Admitting: Acute Care

## 2021-09-14 DIAGNOSIS — C3490 Malignant neoplasm of unspecified part of unspecified bronchus or lung: Secondary | ICD-10-CM

## 2021-09-14 DIAGNOSIS — R918 Other nonspecific abnormal finding of lung field: Secondary | ICD-10-CM

## 2021-09-14 DIAGNOSIS — R846 Abnormal cytological findings in specimens from respiratory organs and thorax: Secondary | ICD-10-CM | POA: Diagnosis not present

## 2021-09-14 DIAGNOSIS — Z87891 Personal history of nicotine dependence: Secondary | ICD-10-CM | POA: Diagnosis not present

## 2021-09-14 DIAGNOSIS — C3411 Malignant neoplasm of upper lobe, right bronchus or lung: Secondary | ICD-10-CM | POA: Insufficient documentation

## 2021-09-14 DIAGNOSIS — Z419 Encounter for procedure for purposes other than remedying health state, unspecified: Secondary | ICD-10-CM

## 2021-09-14 DIAGNOSIS — E119 Type 2 diabetes mellitus without complications: Secondary | ICD-10-CM | POA: Diagnosis not present

## 2021-09-14 DIAGNOSIS — Z9889 Other specified postprocedural states: Secondary | ICD-10-CM

## 2021-09-14 HISTORY — DX: Malignant neoplasm of unspecified part of unspecified bronchus or lung: C34.90

## 2021-09-14 HISTORY — PX: BRONCHIAL NEEDLE ASPIRATION BIOPSY: SHX5106

## 2021-09-14 HISTORY — PX: VIDEO BRONCHOSCOPY WITH RADIAL ENDOBRONCHIAL ULTRASOUND: SHX6849

## 2021-09-14 HISTORY — PX: BRONCHIAL BRUSHINGS: SHX5108

## 2021-09-14 HISTORY — PX: BRONCHIAL BIOPSY: SHX5109

## 2021-09-14 HISTORY — DX: Malignant (primary) neoplasm, unspecified: C80.1

## 2021-09-14 LAB — CBC
HCT: 41.9 % (ref 36.0–46.0)
Hemoglobin: 12.9 g/dL (ref 12.0–15.0)
MCH: 27.2 pg (ref 26.0–34.0)
MCHC: 30.8 g/dL (ref 30.0–36.0)
MCV: 88.2 fL (ref 80.0–100.0)
Platelets: 267 10*3/uL (ref 150–400)
RBC: 4.75 MIL/uL (ref 3.87–5.11)
RDW: 15.1 % (ref 11.5–15.5)
WBC: 8 10*3/uL (ref 4.0–10.5)
nRBC: 0 % (ref 0.0–0.2)

## 2021-09-14 LAB — BASIC METABOLIC PANEL
Anion gap: 12 (ref 5–15)
BUN: 13 mg/dL (ref 8–23)
CO2: 24 mmol/L (ref 22–32)
Calcium: 9.1 mg/dL (ref 8.9–10.3)
Chloride: 102 mmol/L (ref 98–111)
Creatinine, Ser: 0.72 mg/dL (ref 0.44–1.00)
GFR, Estimated: >60 mL/min (ref >60)
Glucose, Bld: 112 mg/dL — ABNORMAL HIGH (ref 70–99)
Potassium: 3.8 mmol/L (ref 3.5–5.1)
Sodium: 138 mmol/L (ref 135–145)

## 2021-09-14 LAB — GLUCOSE, CAPILLARY
Glucose-Capillary: 119 mg/dL — ABNORMAL HIGH (ref 70–99)
Glucose-Capillary: 132 mg/dL — ABNORMAL HIGH (ref 70–99)

## 2021-09-14 SURGERY — BRONCHOSCOPY, WITH BIOPSY USING ELECTROMAGNETIC NAVIGATION
Anesthesia: General | Laterality: Right

## 2021-09-14 MED ORDER — LACTATED RINGERS IV SOLN: INTRAVENOUS | Status: DC

## 2021-09-14 MED ORDER — ONDANSETRON HCL 4 MG/2ML IJ SOLN
INTRAMUSCULAR | Status: DC | PRN
Start: 2021-09-14 — End: 2021-09-14
  Administered 2021-09-14: 4 mg via INTRAVENOUS

## 2021-09-14 MED ORDER — MIDAZOLAM HCL 2 MG/2ML IJ SOLN
INTRAMUSCULAR | Status: DC | PRN
  Administered 2021-09-14: 1 mg via INTRAVENOUS

## 2021-09-14 MED ORDER — SUGAMMADEX SODIUM 200 MG/2ML IV SOLN
INTRAVENOUS | Status: DC | PRN
  Administered 2021-09-14: 200 mg via INTRAVENOUS

## 2021-09-14 MED ORDER — LIDOCAINE 2% (20 MG/ML) 5 ML SYRINGE
INTRAMUSCULAR | Status: DC | PRN
  Administered 2021-09-14: 80 mg via INTRAVENOUS

## 2021-09-14 MED ORDER — DEXAMETHASONE SODIUM PHOSPHATE 10 MG/ML IJ SOLN
INTRAMUSCULAR | Status: DC | PRN
Start: 2021-09-14 — End: 2021-09-14
  Administered 2021-09-14: 10 mg via INTRAVENOUS

## 2021-09-14 MED ORDER — PROPOFOL 10 MG/ML IV BOLUS
INTRAVENOUS | Status: DC | PRN
  Administered 2021-09-14: 20 mg via INTRAVENOUS
  Administered 2021-09-14: 120 mg via INTRAVENOUS

## 2021-09-14 MED ORDER — FENTANYL CITRATE (PF) 100 MCG/2ML IJ SOLN: 25.0000 ug | INTRAMUSCULAR | Status: DC | PRN

## 2021-09-14 MED ORDER — EPHEDRINE SULFATE-NACL 50-0.9 MG/10ML-% IV SOSY
PREFILLED_SYRINGE | INTRAVENOUS | Status: DC | PRN
  Administered 2021-09-14 (×2): 5 mg via INTRAVENOUS

## 2021-09-14 MED ORDER — ROCURONIUM BROMIDE 10 MG/ML (PF) SYRINGE
PREFILLED_SYRINGE | INTRAVENOUS | Status: DC | PRN
  Administered 2021-09-14: 50 mg via INTRAVENOUS
  Administered 2021-09-14: 10 mg via INTRAVENOUS

## 2021-09-14 MED ORDER — PHENYLEPHRINE 40 MCG/ML (10ML) SYRINGE FOR IV PUSH (FOR BLOOD PRESSURE SUPPORT)
PREFILLED_SYRINGE | INTRAVENOUS | Status: DC | PRN
  Administered 2021-09-14 (×5): 80 ug via INTRAVENOUS

## 2021-09-14 MED ORDER — CHLORHEXIDINE GLUCONATE 0.12 % MT SOLN
15.0000 mL | Freq: Once | OROMUCOSAL | Status: AC
  Administered 2021-09-14: 15 mL via OROMUCOSAL
  Filled 2021-09-14 (×2): qty 15

## 2021-09-14 MED ORDER — ACETAMINOPHEN 500 MG PO TABS
1000.0000 mg | ORAL_TABLET | Freq: Once | ORAL | Status: AC
  Administered 2021-09-14: 1000 mg via ORAL
  Filled 2021-09-14: qty 2

## 2021-09-14 MED ORDER — ONDANSETRON HCL 4 MG/2ML IJ SOLN: 4.0000 mg | Freq: Once | INTRAMUSCULAR | Status: DC | PRN

## 2021-09-14 MED ORDER — FENTANYL CITRATE (PF) 250 MCG/5ML IJ SOLN
INTRAMUSCULAR | Status: DC | PRN
Start: 2021-09-14 — End: 2021-09-14
  Administered 2021-09-14: 100 ug via INTRAVENOUS

## 2021-09-14 NOTE — Op Note (Addendum)
Video Bronchoscopy with Robotic Assisted Bronchoscopic Navigation   Date of Operation: 09/14/2021   Pre-op Diagnosis: Multiple pulmonary nodules  Post-op Diagnosis: Multiple pulmonary nodules  Surgeon: Garner Nash, DO   Assistants: None   Anesthesia: General endotracheal anesthesia  Operation: Flexible video fiberoptic bronchoscopy with robotic assistance and biopsies.  Estimated Blood Loss: Minimal  Complications: None  Indications and History: Dorothy Gross is a 71 y.o. female with history of multiple pulmonary nodules. The risks, benefits, complications, treatment options and expected outcomes were discussed with the patient.  The possibilities of pneumothorax, pneumonia, reaction to medication, pulmonary aspiration, perforation of a viscus, bleeding, failure to diagnose a condition and creating a complication requiring transfusion or operation were discussed with the patient who freely signed the consent.    Description of Procedure: The patient was seen in the Preoperative Area, was examined and was deemed appropriate to proceed.  The patient was taken to [facility] endoscopy room 3, identified as Dorothy Gross and the procedure verified as Flexible Video Fiberoptic Bronchoscopy.  A Time Out was held and the above information confirmed.   Prior to the date of the procedure a high-resolution CT scan of the chest was performed. Utilizing ION software program a virtual tracheobronchial tree was generated to allow the creation of distinct navigation pathways to the patient's parenchymal abnormalities. After being taken to the operating room general anesthesia was initiated and the patient  was orally intubated. The video fiberoptic bronchoscope was introduced via the endotracheal tube and a general inspection was performed which showed normal right and left lung anatomy, aspiration of the bilateral mainstems was completed to remove any remaining secretions. Robotic catheter inserted  into patient's endotracheal tube.   Target #1 right upper lobe posterior along the fissure: The distinct navigation pathways prepared prior to this procedure were then utilized to navigate to patient's lesion identified on CT scan. The robotic catheter was secured into place and the vision probe was withdrawn.  Lesion location was approximated using fluoroscopy, three-dimensional cone beam CT imaging, radial endobronchial ultrasound for peripheral targeting. Under fluoroscopic guidance transbronchial needle brushings, transbronchial needle biopsies, and transbronchial forceps biopsies were performed to be sent for cytology and pathology.   Following tissue biopsies from this posterior lesion there was a significant amount of bleeding into the airway that did require [place] to suction out both mainstem's.  The blood clotted well and there was no evidence of active bleeding coming from the segment that was biopsied.  We therefore had to reregistered the system after we needed to disconnect for airway clearance and therapeutic aspiration bilaterally.  And then we went after target to as described below.  Target #2 right upper lobe apical: The distinct navigation pathways prepared prior to this procedure were then utilized to navigate to patient's lesion identified on CT scan. The robotic catheter was secured into place and the vision probe was withdrawn.  Lesion location was approximated using fluoroscopy, three-dimensional cone beam CT imaging, radial endobronchial ultrasound for peripheral targeting. Under fluoroscopic guidance transbronchial needle brushings, transbronchial needle biopsies, and transbronchial forceps biopsies were performed to be sent for cytology and pathology.   At the end of the procedure a general airway inspection was performed and there was no evidence of active bleeding. The bronchoscope was removed.  The patient tolerated the procedure well. There was no significant blood loss and  there were no obvious complications. A post-procedural chest x-ray is pending.  Samples Target #1: 1. Transbronchial needle brushings from right upper  lobe posterior 2. Transbronchial Wang needle biopsies from right upper lobe posterior 3. Transbronchial forceps biopsies from right upper lobe posterior  Samples Target #2: 1. Transbronchial needle brushings from right upper lobe apical 2. Transbronchial Wang needle biopsies from right upper lobe apical 3. Transbronchial forceps biopsies from right upper lobe apical  Plans:  The patient will be discharged from the PACU to home when recovered from anesthesia and after chest x-ray is reviewed. We will review the cytology, pathology and microbiology results with the patient when they become available. Outpatient followup will be with Garner Nash, DO.   Garner Nash, DO [facility] 09/14/2021 11:17 AM

## 2021-09-14 NOTE — Interval H&P Note (Signed)
History and Physical Interval Note:  09/14/2021 9:14 AM  Dorothy Gross  has presented today for surgery, with the diagnosis of multiple lung nodules.  The various methods of treatment have been discussed with the patient and family. After consideration of risks, benefits and other options for treatment, the patient has consented to  Procedure(s) with comments: ROBOTIC ASSISTED NAVIGATIONAL BRONCHOSCOPY (Right) - ION w/ CIOS possible transbronchial cryobiopsies as a surgical intervention.  The patient's history has been reviewed, patient examined, no change in status, stable for surgery.  I have reviewed the patient's chart and labs.  Questions were answered to the patient's satisfaction.     [place]

## 2021-09-14 NOTE — Anesthesia Preprocedure Evaluation (Addendum)
Anesthesia Evaluation  Patient identified by MRN, date of birth, ID band Patient awake    Reviewed: Allergy & Precautions, NPO status , Patient's Chart, lab work & pertinent test results, reviewed documented beta blocker date and time   Airway Mallampati: III  TM Distance: >3 FB Neck ROM: Full    Dental  (+) Teeth Intact, Dental Advisory Given   Pulmonary former smoker,  multiple lung nodules   Pulmonary exam normal breath sounds clear to auscultation       Cardiovascular hypertension, Pt. on home beta blockers and Pt. on medications (-) angina+ CAD and +CHF  (-) Past MI and (-) Cardiac Stents Normal cardiovascular exam+ dysrhythmias Atrial Fibrillation  Rhythm:Regular Rate:Normal     Neuro/Psych negative neurological ROS  negative psych ROS   GI/Hepatic negative GI ROS, Neg liver ROS,   Endo/Other  diabetes, Type 2, Oral Hypoglycemic AgentsObesity   Renal/GU negative Renal ROS     Musculoskeletal  (+) Arthritis ,   Abdominal   Peds  Hematology  (+) Blood dyscrasia (Eliquis), ,   Anesthesia Other Findings Day of surgery medications reviewed with the patient.  Reproductive/Obstetrics                            Anesthesia Physical Anesthesia Plan  ASA: 3  Anesthesia Plan: General   Post-op Pain Management: Tylenol PO (pre-op)   Induction: Intravenous  PONV Risk Score and Plan: 3 and Dexamethasone and Ondansetron  Airway Management Planned: Oral ETT  Additional Equipment:   Intra-op Plan:   Post-operative Plan: Extubation in OR  Informed Consent: I have reviewed the patients History and Physical, chart, labs and discussed the procedure including the risks, benefits and alternatives for the proposed anesthesia with the patient or authorized representative who has indicated his/her understanding and acceptance.     Dental advisory given  Plan Discussed with:  CRNA  Anesthesia Plan Comments:       Anesthesia Quick Evaluation

## 2021-09-14 NOTE — Anesthesia Postprocedure Evaluation (Signed)
Anesthesia Post Note  Patient: Dorothy Gross  Procedure(s) Performed: ROBOTIC ASSISTED NAVIGATIONAL BRONCHOSCOPY (Right) BRONCHIAL BIOPSIES BRONCHIAL BRUSHINGS BRONCHIAL NEEDLE ASPIRATION BIOPSIES RADIAL ENDOBRONCHIAL ULTRASOUND     Patient location during evaluation: PACU Anesthesia Type: General Level of consciousness: awake and alert Pain management: pain level controlled Vital Signs Assessment: post-procedure vital signs reviewed and stable Respiratory status: spontaneous breathing, nonlabored ventilation, respiratory function stable and patient connected to nasal cannula oxygen Cardiovascular status: blood pressure returned to baseline and stable Postop Assessment: no apparent nausea or vomiting Anesthetic complications: no   No notable events documented.  Last Vitals:  Vitals:   09/14/21 1215 09/14/21 1230  BP: (!) 142/61 (!) 145/66  Pulse: 67 71  Resp: 18 (!) 25  Temp:    SpO2: 92% 92%    Last Pain:  Vitals:   09/14/21 1230  TempSrc:   PainSc: 0-No pain                 Catalina Gravel

## 2021-09-14 NOTE — Anesthesia Procedure Notes (Signed)
Procedure Name: Intubation Date/Time: 09/14/2021 9:30 AM Performed by: Carolan Clines, CRNA Pre-anesthesia Checklist: Patient identified, Emergency Drugs available, Suction available and Patient being monitored Patient Re-evaluated:Patient Re-evaluated prior to induction Oxygen Delivery Method: Circle System Utilized Preoxygenation: Pre-oxygenation with 100% oxygen Induction Type: IV induction Ventilation: Mask ventilation without difficulty Laryngoscope Size: Mac and 3 Grade View: Grade III Tube type: Oral Tube size: 8.5 mm Number of attempts: 1 Airway Equipment and Method: Bougie stylet Placement Confirmation: ETT inserted through vocal cords under direct vision, positive ETCO2 and breath sounds checked- equal and bilateral Secured at: 23 cm Tube secured with: Tape Dental Injury: Teeth and Oropharynx as per pre-operative assessment  Comments: DL x 1 with Mac 3, grade III view. Bougie stylet utilized with successful intubation.

## 2021-09-14 NOTE — H&P (Signed)
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  Patient referred in November following a CT abdomen and pelvis on 07/25/2020 which revealed stable bilateral adrenal nodules, new 4 mm right lower lobe pulmonary nodule which recommended follow-up.  Patient has no respiratory complaints today.  She states this entire story started with a diagnosis of kidney stones in which she had adrenal nodules found.  She had follow-up imaging of the adrenal nodules which revealed the abnormality on her CT scan.  Overall she currently works for [employer].  She is worked there for 47 years.  She is a former smoker.  She is smoked on and off since that about the age of 59.  At her heaviest was a half a pack a day.  There is several years in between which she quit at the time that she was pregnant and restarted after each pregnancy.  She quit after her last child and ultimately restarted smoking after her divorce.  She finally quit smoking in 2009.   OV 01/29/2021: Here today for follow-up regarding recent CT scan of the chest.  CT scan of the chest on 01/20/2021 multiple bilateral upper and midlung zone predominant groundglass nodules the largest within the posterior right upper lobe.  Multifocal adenocarcinoma cannot be excluded.  Patient has peripheral and basilar predominant subpleural densities as well concerning for possible underlying NSIP.  Patient has no respiratory complaints today.   OV 07/20/2021: Here today for follow-up after recent CT scan of the chest.  He she has multiple right-sided and other smaller bilateral groundglass some solid lesions within the chest.   She had bronchoscopy with tissue biopsy.  There was concern for multifocal adenocarcinoma.  All tissue biopsies however were negative for malignancy.  Patient was ultimately sent for repeat CT imaging.  This was done at the beginning of the month.  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  We reviewed them today in the office and discussed all of the following next steps.   09/14/2020: here today for planned outpatient bronchoscopy         Past Medical History:  Diagnosis Date   A-fib [facility])     Arthritis     CAD (coronary artery disease)     Cardiomyopathy ([place])     CHF (congestive heart failure) ([place])     Diabetes mellitus without complication ([place])     History of kidney stones     Hyperlipidemia     Hypertension     Kidney stone             Family History  Problem Relation Age of Onset   Diabetes Mother     Diabetes Sister     Diabetes Maternal Aunt     Breast cancer Neg Hx             Past Surgical History:  Procedure Laterality Date   ABDOMINAL HYSTERECTOMY       APPENDECTOMY       BREAST EXCISIONAL BIOPSY Right [id number]    negative 1974 negative 2015   BRONCHIAL BIOPSY   03/23/2021  Procedure: BRONCHIAL BIOPSIES;  Surgeon: Garner Nash, DO;  Location: [facility]COPY;  Service: Pulmonary;;   BRONCHIAL BRUSHINGS   03/23/2021    Procedure: BRONCHIAL BRUSHINGS;  Surgeon: Garner Nash, DO;  Location: [place] [facility]COPY;  Service: Pulmonary;;   BRONCHIAL WASHINGS   03/23/2021    Procedure: BRONCHIAL WASHINGS;  Surgeon: Garner Nash, DO;  Location: [facility];  Service: Pulmonary;;   CHOLECYSTECTOMY N/A 12/05/2015    Procedure: LAPAROSCOPIC CHOLECYSTECTOMY WITH INTRAOPERATIVE CHOLANGIOGRAM;  Surgeon: Leonie Green, MD;  Location: [facility] ORS;  Service: General;  Laterality: N/A;   COLONOSCOPY WITH PROPOFOL N/A 10/18/2016    Procedure: COLONOSCOPY WITH PROPOFOL;  Surgeon: Manya Silvas, MD;   Location: [facility] ENDOSCOPY;  Service: Endoscopy;  Laterality: N/A;   COLONOSCOPY WITH PROPOFOL N/A 03/17/2018    Procedure: COLONOSCOPY WITH PROPOFOL;  Surgeon: Manya Silvas, MD;  Location: [facility] ENDOSCOPY;  Service: Endoscopy;  Laterality: N/A;   CYSTOSCOPY W/ RETROGRADES Bilateral 03/07/2020    Procedure: CYSTOSCOPY WITH RETROGRADE PYELOGRAM;  Surgeon: Billey Co, MD;  Location: [facility] ORS;  Service: Urology;  Laterality: Bilateral;   CYSTOSCOPY/URETEROSCOPY/HOLMIUM LASER/STENT PLACEMENT Bilateral 03/07/2020    Procedure: CYSTOSCOPY/URETEROSCOPY/HOLMIUM LASER/STENT PLACEMENT;  Surgeon: Billey Co, MD;  Location: [facility] ORS;  Service: Urology;  Laterality: Bilateral;   FIDUCIAL MARKER PLACEMENT   03/23/2021    Procedure: FIDUCIAL MARKER PLACEMENT;  Surgeon: Garner Nash, DO;  Location: [place] ENDOSCOPY;  Service: Pulmonary;;   JOINT REPLACEMENT        both knees   REPLACEMENT TOTAL KNEE BILATERAL       TONSILLECTOMY       VIDEO BRONCHOSCOPY WITH ENDOBRONCHIAL NAVIGATION Right 03/23/2021    Procedure: VIDEO BRONCHOSCOPY WITH ENDOBRONCHIAL NAVIGATION;  Surgeon: Garner Nash, DO;  Location: [place];  Service: Pulmonary;  Laterality: Right;  ION Case    VIDEO BRONCHOSCOPY WITH RADIAL ENDOBRONCHIAL ULTRASOUND   03/23/2021    Procedure: VIDEO BRONCHOSCOPY WITH RADIAL ENDOBRONCHIAL ULTRASOUND;  Surgeon: Garner Nash, DO;  Location: [facility] ENDOSCOPY;  Service: Pulmonary;;      Social History         Socioeconomic History   Marital status: Divorced      Spouse name: Not on file   Number of children: Not on file   Years of education: Not on file   Highest education level: Not on file  Occupational History   Not on file  Tobacco Use   Smoking status: Former      Packs/day: 0.25      Years: 15.00      Pack years: 3.75      Types: Cigarettes      Quit date: 08/31/2007      Years since quitting: 13.8   Smokeless tobacco: Never  Vaping Use   Vaping Use: Never used  Substance and  Sexual Activity   Alcohol use: No      Alcohol/week: 0.0 standard drinks   Drug use: No   Sexual activity: Not Currently      Birth control/protection: Post-menopausal  Other Topics Concern   Not on file  Social History Narrative   Not on file    Social Determinants of Health    Financial Resource Strain: Not on file  Food Insecurity: Not on file  Transportation Needs: Not on file  Physical Activity: Not on file  Stress: Not on file  Social Connections: Not on file  Intimate Partner Violence: Not on file      No Known Allergies  Outpatient Medications Prior to Visit  Medication Sig Dispense Refill   acetaminophen (TYLENOL) 500 MG tablet Take 1,000 mg by mouth every 6 (six) hours as needed.       amiodarone (PACERONE) 200 MG tablet Take 200 mg by mouth daily.        apixaban (ELIQUIS) 5 MG TABS tablet Take 5 mg by mouth 2 (two) times daily.        BIOTIN PO Take 1 capsule by mouth daily.       calcium carbonate (OS-CAL - DOSED IN MG OF ELEMENTAL CALCIUM) 1250 (500 Ca) MG tablet Take 1 tablet by mouth daily with breakfast.       carvedilol (COREG) 6.25 MG tablet Take 6.25 mg by mouth 2 (two) times daily with a meal.        Cholecalciferol (VITAMIN D3) 1000 units CAPS Take 1,000 Units by mouth daily.        cyanocobalamin 1000 MCG tablet Take 1,000 mcg by mouth daily.        digoxin (LANOXIN) 0.125 MG tablet Take 0.125 mg by mouth daily.        ferrous sulfate 325 (65 FE) MG tablet Take 325 mg by mouth every other day.       Flaxseed, Linseed, (FLAX SEED OIL PO) Take 2 capsules by mouth daily.       fluticasone (FLONASE) 50 MCG/ACT nasal spray Place 1 spray into both nostrils daily as needed for allergies.        furosemide (LASIX) 40 MG tablet Take 40 mg by mouth daily.        glucose blood (ONETOUCH ULTRA) test strip USE 1 EACH (1 STRIP TOTAL) 3 (THREE) TIMES DAILY       Lancets MISC         losartan (COZAAR) 25 MG tablet Take 50 mg by mouth daily.       metFORMIN  (GLUCOPHAGE-XR) 500 MG 24 hr tablet Take 1,000 mg by mouth daily with supper.       Multiple Vitamins-Minerals (MULTIVITAMIN ADULT PO) Take 1 tablet by mouth daily.       Polyethyl Glycol-Propyl Glycol (SYSTANE OP) Place 1 drop into both eyes daily as needed (dry eyes).       rosuvastatin (CRESTOR) 10 MG tablet Take 10 mg by mouth daily.        zinc gluconate 50 MG tablet Take 50 mg by mouth daily.         No facility-administered medications prior to visit.    Review of Systems  Constitutional:  Negative for chills, fever, malaise/fatigue and weight loss.  HENT:  Negative for hearing loss, sore throat and tinnitus.   Eyes:  Negative for blurred vision and double vision.  Respiratory:  Negative for cough, hemoptysis, sputum production, shortness of breath, wheezing and stridor.   Cardiovascular:  Negative for chest pain, palpitations, orthopnea, leg swelling and PND.  Gastrointestinal:  Negative for abdominal pain, constipation, diarrhea, heartburn, nausea and vomiting.  Genitourinary:  Negative for dysuria, hematuria and urgency.  Musculoskeletal:  Negative for joint pain and myalgias.  Skin:  Negative for itching and rash.  Neurological:  Negative for dizziness, tingling, weakness and headaches.  Endo/Heme/Allergies:  Negative for environmental allergies. Does not bruise/bleed easily.  Psychiatric/Behavioral:  Negative for depression. The patient is not nervous/anxious and does not have insomnia.   All other systems reviewed and are negative.    Objective:   General appearance: 71 y.o., female, NAD, conversant  Eyes: anicteric sclerae, moist  conjunctivae; no lid-lag; PERRLA, tracking appropriately HENT: NCAT; oropharynx, MMM, no mucosal ulcerations; normal hard and soft palate Neck: Trachea midline; FROM, supple, lymphadenopathy, no JVD Lungs: CTAB, no crackles, no wheeze, with normal respiratory effort and no intercostal retractions CV: RRR, S1, S2, no MRGs  Abdomen: Soft,  non-tender; non-distended, BS present  Extremities: No peripheral edema, radial and DP pulses present bilaterally  Skin: Normal temperature, turgor and texture; no rash Psych: Appropriate affect Neuro: Alert and oriented to person and place, no focal deficit     BP (!) 165/69    Pulse 67    Temp (!) 97.5 F (36.4 C) (Oral)    Resp 18    Ht 5\' 4"  (1.626 m)    Wt 89.4 kg    SpO2 95%    BMI 33.81 kg/m     CBC Labs (Brief)          Component Value Date/Time    WBC 7.4 03/23/2021 0738    RBC 4.43 03/23/2021 0738    HGB 12.2 03/23/2021 0738    HGB 14.4 02/10/2013 0555    HCT 39.5 03/23/2021 0738    HCT 43.8 02/10/2013 0555    PLT 247 03/23/2021 0738    PLT 345 02/10/2013 0555    MCV 89.2 03/23/2021 0738    MCV 83 02/10/2013 0555    MCH 27.5 03/23/2021 0738    MCHC 30.9 03/23/2021 0738    RDW 15.3 03/23/2021 0738    RDW 14.5 02/10/2013 0555    LYMPHSABS 3.2 02/10/2013 0555    MONOABS 0.8 02/10/2013 0555    EOSABS 0.1 02/10/2013 0555    BASOSABS 0.1 02/10/2013 0555          Chest Imaging: CT Abd Pelvis Small 75mm RLL pulmonary nodule The patient's images have been independently reviewed by me.     09/25/2020 CT chest: 42mm groundglass nodule within the right upper lobe other small groundglass lesions identified. The patient's images have been independently reviewed by me.     01/20/2021: Super D CT chest: Persistent bilateral groundglass nodules upper and midlung zone predominant.  Multifocal adenocarcinoma not excluded. The patient's images have been independently reviewed by me.     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 The patient's images have been independently reviewed by me.    07/02/2021:  Persistent subsolid GGO  The patient's images have been independently reviewed by me.     Pulmonary Functions Testing Results: No flowsheet data found.   FeNO:    Pathology:     Echocardiogram:    Heart Catheterization:     Assessment & Plan:        ICD-10-CM    1. Multiple lung nodules  R91.8 Procedural/ Surgical Case Request: ROBOTIC ASSISTED NAVIGATIONAL BRONCHOSCOPY      Ambulatory referral to Pulmonology      CT Super D Chest Wo Contrast     2. Ground glass opacity present on imaging of lung  R91.8       3. Former smoker  Z87.891           Discussion:   This is a 71 year old female, here for follow-up of CT imaging with bilateral groundglass of solid lesions.  She is a former smoker quit greater than 20 years ago.  She had incidental nodules and they have slowly been followed over time.  Already underwent 1 recent bronchoscopy with tissue sampling.  Biopsies were negative for malignancy at  that time.  Plan: Here today for planned bronchoscopy  Patient is agreeable to proceed  Garner Nash, DO [facility] 09/14/2021 9:12 AM

## 2021-09-14 NOTE — Discharge Instructions (Signed)
Flexible Bronchoscopy, Care After This sheet gives you information about how to care for yourself after your test. Your doctor may also give you more specific instructions. If you have problems or questions, contact your doctor. Follow these instructions at home: Eating and drinking Do not eat or drink anything (not even water) for 2 hours after your test, or until your numbing medicine (local anesthetic) wears off. When your numbness is gone and your cough and gag reflexes have come back, you may: Eat only soft foods. Slowly drink liquids. The day after the test, go back to your normal diet. Driving Do not drive for 24 hours if you were given a medicine to help you relax (sedative). Do not drive or use heavy machinery while taking prescription pain medicine. General instructions  Take over-the-counter and prescription medicines only as told by your doctor. Return to your normal activities as told. Ask what activities are safe for you. Do not use any products that have nicotine or tobacco in them. This includes cigarettes and e-cigarettes. If you need help quitting, ask your doctor. Keep all follow-up visits as told by your doctor. This is important. It is very important if you had a tissue sample (biopsy) taken. Get help right away if: You have shortness of breath that gets worse. You get light-headed. You feel like you are going to pass out (faint). You have chest pain. You cough up: More than a little blood. More blood than before. Summary Do not eat or drink anything (not even water) for 2 hours after your test, or until your numbing medicine wears off. Do not use cigarettes. Do not use e-cigarettes. Get help right away if you have chest pain.  This information is not intended to replace advice given to you by your health care provider. Make sure you discuss any questions you have with your health care provider. Document Released: 06/13/2009 Document Revised: 07/29/2017 Document  Reviewed: 09/03/2016 Elsevier Patient Education  2020 [employer].

## 2021-09-14 NOTE — Transfer of Care (Signed)
Immediate Anesthesia Transfer of Care Note  Patient: [name]  Procedure(s) Performed: ROBOTIC ASSISTED NAVIGATIONAL BRONCHOSCOPY (Right) BRONCHIAL BIOPSIES BRONCHIAL BRUSHINGS BRONCHIAL NEEDLE ASPIRATION BIOPSIES RADIAL ENDOBRONCHIAL ULTRASOUND  Patient Location: PACU  Anesthesia Type:General  Level of Consciousness: drowsy  Airway & Oxygen Therapy: Patient Spontanous Breathing and Patient connected to face mask oxygen  Post-op Assessment: Report given to RN and Post -op Vital signs reviewed and stable  Post vital signs: Reviewed and stable  Last Vitals:  Vitals Value Taken Time  BP 153/72 09/14/21 1117  Temp 36.9 C 09/14/21 1115  Pulse 72 09/14/21 1123  Resp 24 09/14/21 1123  SpO2 97 % 09/14/21 1123  Vitals shown include unvalidated device data.  Last Pain:  Vitals:   09/14/21 1115  TempSrc:   PainSc: 0-No pain         Complications: No notable events documented.

## 2021-09-15 ENCOUNTER — Telehealth: Payer: Self-pay | Admitting: Pulmonary Disease

## 2021-09-15 LAB — CYTOLOGY - NON PAP

## 2021-09-15 NOTE — Telephone Encounter (Signed)
Called and spoke with patient. She had her bronch yesterday and is scheduled for an OV with BI on Friday. She wanted to know if she needed to reschedule her appt. I advised her that it is possible that BI will her results by Friday. She would like to remain on the schedule and if the results are not back by Friday, she would like to reschedule.   Will send message to Ronney Asters as a FYI to check for her test results on Thursday during chart checks.

## 2021-09-16 ENCOUNTER — Encounter (HOSPITAL_COMMUNITY): Payer: Self-pay | Admitting: Pulmonary Disease

## 2021-09-18 ENCOUNTER — Other Ambulatory Visit: Payer: Self-pay

## 2021-09-18 ENCOUNTER — Encounter: Payer: Self-pay | Admitting: Pulmonary Disease

## 2021-09-18 ENCOUNTER — Telehealth: Payer: Self-pay | Admitting: Internal Medicine

## 2021-09-18 ENCOUNTER — Ambulatory Visit: Payer: Medicare HMO | Admitting: Pulmonary Disease

## 2021-09-18 VITALS — BP 158/76 | HR 64 | Temp 97.5°F | Ht 64.0 in | Wt 197.4 lb

## 2021-09-18 DIAGNOSIS — R918 Other nonspecific abnormal finding of lung field: Secondary | ICD-10-CM

## 2021-09-18 DIAGNOSIS — C3491 Malignant neoplasm of unspecified part of right bronchus or lung: Secondary | ICD-10-CM | POA: Diagnosis not present

## 2021-09-18 DIAGNOSIS — Z87891 Personal history of nicotine dependence: Secondary | ICD-10-CM

## 2021-09-18 NOTE — Patient Instructions (Addendum)
Thank you for visiting Dr. Valeta Harms at [facility]. Today we recommend the following:  Orders Placed This Encounter  Procedures   Ambulatory referral to Hematology / Oncology   Ambulatory referral to Radiation Oncology    Return in about 6 months (around 03/18/2022) for with APP or Dr. Valeta Harms.    Please do your part to reduce the spread of COVID-19.

## 2021-09-18 NOTE — Progress Notes (Signed)
Synopsis: Referred in December 2021 for lung nodule by Maryland Pink, MD  Subjective:   PATIENT ID: Dorothy Gross GENDER: female DOB: [birth year], MRN: [id number]  Chief Complaint  Patient presents with   Follow-up    Patient is here for follow up on biopsy.     72 year old female past medical history of atrial fibrillation, CAD, cardiomyopathy (?viral CM ~7 years ago), type 2 diabetes, hypertension, hyperlipidemia.  Patient referred in November following a CT abdomen and pelvis on 07/25/2020 which revealed stable bilateral adrenal nodules, new 4 mm right lower lobe pulmonary nodule which recommended follow-up.  Patient has no respiratory complaints today.  She states this entire story started with a diagnosis of kidney stones in which she had adrenal nodules found.  She had follow-up imaging of the adrenal nodules which revealed the abnormality on her CT scan.  Overall she currently works for [employer].  She is worked there for 47 years.  She is a former smoker.  She is smoked on and off since that about the age of 65.  At her heaviest was a half a pack a day.  There is several years in between which she quit at the time that she was pregnant and restarted after each pregnancy.  She quit after her last child and ultimately restarted smoking after her divorce.  She finally quit smoking in 2009.  OV 01/29/2021: Here today for follow-up regarding recent CT scan of the chest.  CT scan of the chest on 01/20/2021 multiple bilateral upper and midlung zone predominant groundglass nodules the largest within the posterior right upper lobe.  Multifocal adenocarcinoma cannot be excluded.  Patient has peripheral and basilar predominant subpleural densities as well concerning for possible underlying NSIP.  Patient has no respiratory complaints today.  OV 07/20/2021: Here today for follow-up after recent CT scan of the chest.  He she has multiple right-sided and other smaller bilateral groundglass  some solid lesions within the chest.  She had bronchoscopy with tissue biopsy.  There was concern for multifocal adenocarcinoma.  All tissue biopsies however were negative for malignancy.  Patient was ultimately sent for repeat CT imaging.  This was done at the beginning of the month.  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  We reviewed them today in the office and discussed all of the following next steps.  OV 09/18/2021: Here today for follow-up after recent bronchoscopy.Patient had a repeat CT scan of the chest in 07/02/2021.  This repeat showed persistence of the 1.8 x 1.7 cm sup solid lesion worrisome for primary malignancy with associated upper lobe groundglass lesion.  Previous bronchoscopy back in July was nondiagnostic for malignancy.  After reviewing images with the patient and discussing next best steps we made the decision for repeat bronchoscopy.  Patient was taken for repeat robotic assisted navigational bronchoscopy on 09/14/2021.  And is here today for follow-up.  We were able to establish diagnosis.  Cytology specimens for the right upper lobe lesion target #1 which was against the major fissure line was consistent with adenocarcinoma of the lung.  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   Cardiomyopathy ([place])    CHF (congestive heart failure) ([place])    Diabetes mellitus without complication ([place])  History of kidney stones    Hyperlipidemia    Hypertension      Family History  Problem Relation Age of Onset   Diabetes Mother    Diabetes Sister    Diabetes Maternal Aunt    Breast cancer Neg Hx      Past Surgical History:  Procedure Laterality Date   ABDOMINAL HYSTERECTOMY     APPENDECTOMY     BREAST EXCISIONAL BIOPSY Right [id number]    negative 1974 negative 2015   BRONCHIAL BIOPSY  03/23/2021   Procedure: BRONCHIAL BIOPSIES;  Surgeon: Garner Nash, DO;  Location: [place] [facility];  Service: Pulmonary;;   BRONCHIAL BIOPSY  09/14/2021   Procedure: BRONCHIAL BIOPSIES;  Surgeon: Garner Nash, DO;  Location: [place] [facility];  Service: Pulmonary;;   BRONCHIAL BRUSHINGS  03/23/2021   Procedure: BRONCHIAL BRUSHINGS;  Surgeon: Garner Nash, DO;  Location: [place] [facility];  Service: Pulmonary;;   BRONCHIAL BRUSHINGS  09/14/2021   Procedure: BRONCHIAL BRUSHINGS;  Surgeon: Garner Nash, DO;  Location: [place];  Service: Pulmonary;;   BRONCHIAL NEEDLE ASPIRATION BIOPSY  09/14/2021   Procedure: BRONCHIAL NEEDLE ASPIRATION BIOPSIES;  Surgeon: Garner Nash, DO;  Location: [place] [facility];  Service: Pulmonary;;   BRONCHIAL WASHINGS  03/23/2021   Procedure: BRONCHIAL WASHINGS;  Surgeon: Garner Nash, DO;  Location: [place] [facility];  Service: Pulmonary;;   CHOLECYSTECTOMY N/A 12/05/2015   Procedure: LAPAROSCOPIC CHOLECYSTECTOMY WITH INTRAOPERATIVE CHOLANGIOGRAM;  Surgeon: Leonie Green, MD;  Location: [facility];  Service: General;  Laterality: N/A;   COLONOSCOPY WITH PROPOFOL N/A 10/18/2016   Procedure: COLONOSCOPY WITH PROPOFOL;  Surgeon: Manya Silvas, MD;  Location[facility];  Service: Endoscopy;  Laterality: N/A;   COLONOSCOPY WITH PROPOFOL N/A 03/17/2018   Procedure: COLONOSCOPY WITH PROPOFOL;  Surgeon: Manya Silvas, MD;  Location: [facility]COPY;  Service: Endoscopy;  Laterality: N/A;   CYSTOSCOPY W/ RETROGRADES Bilateral 03/07/2020   Procedure: CYSTOSCOPY WITH RETROGRADE PYELOGRAM;  Surgeon: Billey Co, MD;  Location: [facility];  Service: Urology;  Laterality: Bilateral;   CYSTOSCOPY/URETEROSCOPY/HOLMIUM LASER/STENT PLACEMENT Bilateral 03/07/2020   Procedure: CYSTOSCOPY/URETEROSCOPY/HOLMIUM LASER/STENT PLACEMENT;  Surgeon: Billey Co, MD;  Location: [facility];  Service: Urology;  Laterality: Bilateral;    FIDUCIAL MARKER PLACEMENT  03/23/2021   Procedure: FIDUCIAL MARKER PLACEMENT;  Surgeon: Garner Nash, DO;  Location: [place] [facility]COPY;  Service: Pulmonary;;   JOINT REPLACEMENT     both knees   REPLACEMENT TOTAL KNEE BILATERAL     TONSILLECTOMY     VIDEO BRONCHOSCOPY WITH ENDOBRONCHIAL NAVIGATION Right 03/23/2021   Procedure: VIDEO BRONCHOSCOPY WITH ENDOBRONCHIAL NAVIGATION;  Surgeon: Garner Nash, DO;  Location: [place];  Service: Pulmonary;  Laterality: Right;  ION Case    VIDEO BRONCHOSCOPY WITH RADIAL ENDOBRONCHIAL ULTRASOUND  03/23/2021   Procedure: VIDEO BRONCHOSCOPY WITH RADIAL ENDOBRONCHIAL ULTRASOUND;  Surgeon: Garner Nash, DO;  Location: [place];  Service: Pulmonary;;   VIDEO BRONCHOSCOPY WITH RADIAL ENDOBRONCHIAL ULTRASOUND  09/14/2021   Procedure: RADIAL ENDOBRONCHIAL ULTRASOUND;  Surgeon: Garner Nash, DO;  Location: [facility];  Service: Pulmonary;;    Social History   Socioeconomic History   Marital status: Divorced    Spouse name: Not on file   Number of children: Not on file   Years of education: Not on file   Highest education level: Not on file  Occupational History   Not on file  Tobacco Use   Smoking status: Former    Packs/day: 0.25    Years: 15.00  Pack years: 3.75    Types: Cigarettes    Quit date: 08/31/2007    Years since quitting: 14.0   Smokeless tobacco: Never  Vaping Use   Vaping Use: Never used  Substance and Sexual Activity   Alcohol use: No    Alcohol/week: 0.0 standard drinks   Drug use: No   Sexual activity: Not Currently    Birth control/protection: Post-menopausal  Other Topics Concern   Not on file  Social History Narrative   Not on file   Social Determinants of Health   Financial Resource Strain: Not on file  Food Insecurity: Not on file  Transportation Needs: Not on file  Physical Activity: Not on file  Stress: Not on file  Social Connections: Not on file  Intimate Partner Violence: Not on file      No Known Allergies   Outpatient Medications Prior to Visit  Medication Sig Dispense Refill   acetaminophen (TYLENOL) 500 MG tablet Take 1,000 mg by mouth every 6 (six) hours as needed.     amiodarone (PACERONE) 200 MG tablet Take 200 mg by mouth daily.      apixaban (ELIQUIS) 5 MG TABS tablet Take 5 mg by mouth 2 (two) times daily.      BIOTIN PO Take 1 capsule by mouth daily.     calcium carbonate (OS-CAL - DOSED IN MG OF ELEMENTAL CALCIUM) 1250 (500 Ca) MG tablet Take 1 tablet by mouth daily with breakfast.     carvedilol (COREG) 6.25 MG tablet Take 6.25 mg by mouth 2 (two) times daily with a meal.      Cholecalciferol (VITAMIN D3) 1000 units CAPS Take 1,000 Units by mouth daily.      cyanocobalamin 1000 MCG tablet Take 1,000 mcg by mouth daily.      digoxin (LANOXIN) 0.125 MG tablet Take 0.125 mg by mouth daily.      doxycycline (MONODOX) 100 MG capsule Take 100 mg by mouth 2 (two) times daily.     ferrous sulfate 325 (65 FE) MG tablet Take 325 mg by mouth every other day.     Flaxseed, Linseed, (FLAX SEED OIL PO) Take 2 capsules by mouth daily.     furosemide (LASIX) 40 MG tablet Take 40 mg by mouth daily.      glucose blood (ONETOUCH ULTRA) test strip USE 1 EACH (1 STRIP TOTAL) 3 (THREE) TIMES DAILY     Lancets MISC      losartan (COZAAR) 25 MG tablet Take 50 mg by mouth daily.     metFORMIN (GLUCOPHAGE-XR) 500 MG 24 hr tablet Take 1,000 mg by mouth daily with supper.     Multiple Vitamins-Minerals (MULTIVITAMIN ADULT PO) Take 1 tablet by mouth daily.     Polyethyl Glycol-Propyl Glycol (SYSTANE OP) Place 1 drop into both eyes daily as needed (dry eyes).     rosuvastatin (CRESTOR) 10 MG tablet Take 10 mg by mouth daily.      sodium chloride (OCEAN) 0.65 % SOLN nasal spray Place 1 spray into both nostrils daily as needed (Dry nose).     zinc gluconate 50 MG tablet Take 50 mg by mouth daily.      No facility-administered medications prior to visit.    Review of Systems   Constitutional:  Negative for chills, fever, malaise/fatigue and weight loss.  HENT:  Negative for hearing loss, sore throat and tinnitus.   Eyes:  Negative for blurred vision and double vision.  Respiratory:  Negative for cough, hemoptysis, sputum production, shortness  of breath, wheezing and stridor.   Cardiovascular:  Negative for chest pain, palpitations, orthopnea, leg swelling and PND.  Gastrointestinal:  Negative for abdominal pain, constipation, diarrhea, heartburn, nausea and vomiting.  Genitourinary:  Negative for dysuria, hematuria and urgency.  Musculoskeletal:  Negative for joint pain and myalgias.  Skin:  Negative for itching and rash.  Neurological:  Negative for dizziness, tingling, weakness and headaches.  Endo/Heme/Allergies:  Negative for environmental allergies. Does not bruise/bleed easily.  Psychiatric/Behavioral:  Negative for depression. The patient is not nervous/anxious and does not have insomnia.   All other systems reviewed and are negative.   Objective:  Physical Exam Vitals reviewed.  Constitutional:      General: She is not in acute distress.    Appearance: She is well-developed. She is obese.  HENT:     Head: Normocephalic and atraumatic.  Eyes:     General: No scleral icterus.    Conjunctiva/sclera: Conjunctivae normal.     Pupils: Pupils are equal, round, and reactive to light.  Neck:     Vascular: No JVD.     Trachea: No tracheal deviation.  Cardiovascular:     Rate and Rhythm: Normal rate and regular rhythm.     Heart sounds: Normal heart sounds. No murmur heard. Pulmonary:     Effort: Pulmonary effort is normal. No tachypnea, accessory muscle usage or respiratory distress.     Breath sounds: No stridor. No wheezing, rhonchi or rales.  Abdominal:     General: There is no distension.     Palpations: Abdomen is soft.     Tenderness: There is no abdominal tenderness.  Musculoskeletal:        General: No tenderness.     Cervical back: Neck  supple.  Lymphadenopathy:     Cervical: No cervical adenopathy.  Skin:    General: Skin is warm and dry.     Capillary Refill: Capillary refill takes less than 2 seconds.     Findings: No rash.  Neurological:     Mental Status: She is alert and oriented to person, place, and time.  Psychiatric:        Behavior: Behavior normal.     Vitals:   09/18/21 0913  BP: (!) 158/76  Pulse: 64  Temp: (!) 97.5 F (36.4 C)  TempSrc: Oral  SpO2: 95%  Weight: 197 lb 6.4 oz (89.5 kg)  Height: 5\' 4"  (1.626 m)    95% on RA BMI Readings from Last 3 Encounters:  09/18/21 33.88 kg/m  09/14/21 33.81 kg/m  07/20/21 34.02 kg/m   Wt Readings from Last 3 Encounters:  09/18/21 197 lb 6.4 oz (89.5 kg)  09/14/21 197 lb (89.4 kg)  07/20/21 198 lb 3.2 oz (89.9 kg)     CBC    Component Value Date/Time   WBC 8.0 09/14/2021 0822   RBC 4.75 09/14/2021 0822   HGB 12.9 09/14/2021 0822   HGB 14.4 02/10/2013 0555   HCT 41.9 09/14/2021 0822   HCT 43.8 02/10/2013 0555   PLT 267 09/14/2021 0822   PLT 345 02/10/2013 0555   MCV 88.2 09/14/2021 0822   MCV 83 02/10/2013 0555   MCH 27.2 09/14/2021 0822   MCHC 30.8 09/14/2021 0822   RDW 15.1 09/14/2021 0822   RDW 14.5 02/10/2013 0555   LYMPHSABS 3.2 02/10/2013 0555   MONOABS 0.8 02/10/2013 0555   EOSABS 0.1 02/10/2013 0555   BASOSABS 0.1 02/10/2013 0555     Chest Imaging: CT Abd Pelvis Small 56mm RLL pulmonary nodule  The patient's images have been independently reviewed by me.    09/25/2020 CT chest: 12mm groundglass nodule within the right upper lobe other small groundglass lesions identified. The patient's images have been independently reviewed by me.    01/20/2021: Super D CT chest: Persistent bilateral groundglass nodules upper and midlung zone predominant.  Multifocal adenocarcinoma not excluded. The patient's images have been independently reviewed by me.    November 2022 CT chest: Persistent subsolid bilateral groundglass nodules  in the right lung concerning for multifocal adenocarcinoma. The patient's images have been independently reviewed by me.    Pulmonary Functions Testing Results: No flowsheet data found.  FeNO:   Pathology:   09/14/2021: FINAL MICROSCOPIC DIAGNOSIS:   A. LUNG, RUL, FINE NEEDLE ASPIRATION AND BIOPSIES:  - Malignant cells consistent with adenocarcinoma, see comment   B. LUNG, RUL, BRUSH:  - No malignant cells identified   C. LUNG, RUL TARGET#2, FINE NEEDLE ASPIRATION AND BIOPSY:  - Atypical cells present    Echocardiogram:   Heart Catheterization:     Assessment & Plan:     ICD-10-CM   1. Adenocarcinoma, lung, right (HCC)  C34.91     2. Multiple pulmonary nodules  R91.8     3. Ground glass opacity present on imaging of lung  R91.8     4. Former smoker  Z87.891        Discussion:  This is a 71 year old female here today for follow-up after recent bronchoscopy.  Tissue sampling was positive for adenocarcinoma of the right upper lobe.  Atypical cells present and target #2 for the apical groundglass opacity.  I do suspect we are dealing with the possibility of multifocal adenocarcinoma clinically.  She has other smaller groundglass lesions within the chest as well.  Plan: Discussed pathology results today in the office. Referral placed to medical oncology as well as radiation oncology. I think there is a possibility that we give her the benefit of the doubt and consider SBRT treatments to both targets in the right lung and watch the lesion in the left lung. She will need guardant molecular blood testing. I have sent a message to medical oncology to help get her set up with appointments.    Current Outpatient Medications:    acetaminophen (TYLENOL) 500 MG tablet, Take 1,000 mg by mouth every 6 (six) hours as needed., Disp: , Rfl:    amiodarone (PACERONE) 200 MG tablet, Take 200 mg by mouth daily. , Disp: , Rfl:    apixaban (ELIQUIS) 5 MG TABS tablet, Take 5 mg by  mouth 2 (two) times daily. , Disp: , Rfl:    BIOTIN PO, Take 1 capsule by mouth daily., Disp: , Rfl:    calcium carbonate (OS-CAL - DOSED IN MG OF ELEMENTAL CALCIUM) 1250 (500 Ca) MG tablet, Take 1 tablet by mouth daily with breakfast., Disp: , Rfl:    carvedilol (COREG) 6.25 MG tablet, Take 6.25 mg by mouth 2 (two) times daily with a meal. , Disp: , Rfl:    Cholecalciferol (VITAMIN D3) 1000 units CAPS, Take 1,000 Units by mouth daily. , Disp: , Rfl:    cyanocobalamin 1000 MCG tablet, Take 1,000 mcg by mouth daily. , Disp: , Rfl:    digoxin (LANOXIN) 0.125 MG tablet, Take 0.125 mg by mouth daily. , Disp: , Rfl:    doxycycline (MONODOX) 100 MG capsule, Take 100 mg by mouth 2 (two) times daily., Disp: , Rfl:    ferrous sulfate 325 (65 FE) MG tablet, Take 325 mg  by mouth every other day., Disp: , Rfl:    Flaxseed, Linseed, (FLAX SEED OIL PO), Take 2 capsules by mouth daily., Disp: , Rfl:    furosemide (LASIX) 40 MG tablet, Take 40 mg by mouth daily. , Disp: , Rfl:    glucose blood (ONETOUCH ULTRA) test strip, USE 1 EACH (1 STRIP TOTAL) 3 (THREE) TIMES DAILY, Disp: , Rfl:    Lancets MISC, , Disp: , Rfl:    losartan (COZAAR) 25 MG tablet, Take 50 mg by mouth daily., Disp: , Rfl:    metFORMIN (GLUCOPHAGE-XR) 500 MG 24 hr tablet, Take 1,000 mg by mouth daily with supper., Disp: , Rfl:    Multiple Vitamins-Minerals (MULTIVITAMIN ADULT PO), Take 1 tablet by mouth daily., Disp: , Rfl:    Polyethyl Glycol-Propyl Glycol (SYSTANE OP), Place 1 drop into both eyes daily as needed (dry eyes)., Disp: , Rfl:    rosuvastatin (CRESTOR) 10 MG tablet, Take 10 mg by mouth daily. , Disp: , Rfl:    sodium chloride (OCEAN) 0.65 % SOLN nasal spray, Place 1 spray into both nostrils daily as needed (Dry nose)., Disp: , Rfl:    zinc gluconate 50 MG tablet, Take 50 mg by mouth daily. , Disp: , Rfl:     Garner Nash, DO [facility] 09/18/2021 9:22 AM

## 2021-09-18 NOTE — Telephone Encounter (Signed)
Scheduled appt per 1/20 referral. Pt is aware of appt date and time.

## 2021-09-21 NOTE — Progress Notes (Signed)
Thoracic Location of Tumor / Histology: RUL NSCLC  Patient presented for follow-up after undergoing bronchoscopy.  She has been in surveillance for lung nodules noted on CT imaging.  Bronchoscopy 09/14/2021:   CT Chest 07/02/2021: Multiple ground-glass pulmonary nodules of varying sizes and solidity throughout the bilateral lungs are unchanged compared to prior examination.  As on prior examination, the largest and morphologically most worrisome nodule is a subsolid nodule of the posterior right upper lobe, which abuts and tents the major fissure, measuring 1.8 x 1.7 cm. An additional subsolid index nodule of the posterior lateral right apex is unchanged, measuring 1.5 x 1.1 cm. A mixed solid and ground-glass nodule of the anterior right lower lobe measures 0.8 x 0.6 cm, central nodular component 0.5 cm. These nodules remain particularly concerning for indolent adenocarcinoma. Several nodules, however have been biopsied bronchoscopically without evidence of malignancy.  CT Chest 03/16/2021: Multiple pulmonary nodules again noted, demonstrating some slow interval growth over prior examinations, with the largest of these in the posterior aspect of the right upper lobe currently measuring 1.8 x 1.8 cm with a central solid component of 8 mm, highly concerning for a primary bronchogenic adenocarcinoma. Given the persistence of numerous other ground-glass attenuation nodules, multicentric disease is suspected. Further evaluation with PET-CT is suggested at this time.  CT Chest 01/20/2021: Multiple bilateral upper and midlung zone predominant ground-glass pulmonary nodules with the largest in the posterior segment right upper lobe, as on 09/25/2020.  Adenocarcinoma cannot be  excluded.  Peripheral and basilar predominant subpleural reticular densities and ground-glass, findings which can be seen with interstitial lung disease such as nonspecific interstitial pneumonitis.  1.7 cm left thyroid nodule.   Biopsies of  RUL Lung Mass 09/14/2021    Tobacco/Marijuana/Snuff/ETOH use: Former Smoker, quit in 2009.  Past/Anticipated interventions by cardiothoracic surgery, if any:  -Not a surgical candidate  Past/Anticipated interventions by medical oncology, if any:    Signs/Symptoms Weight changes, if any: Fluctuates a couple of pounds.  Pretty steady overall. Respiratory complaints, if any: No complaints. Hemoptysis, if any: No overall.  Notes occasional cough since biopsy. Pain issues, if any: no   SAFETY ISSUES: Prior radiation? No Pacemaker/ICD?  No Possible current pregnancy? Hysterectomy Is the patient on methotrexate? No  Current Complaints / other details:

## 2021-09-21 NOTE — Progress Notes (Signed)
Radiation Oncology         [phone] ________________________________  Name: Dorothy Gross        MRN: [id number]  Date of Service: 09/22/2021 DOB: 02-17-1951  ZC:HYIFOYD, Jeneen Rinks, MD  Garner Nash, DO     REFERRING PHYSICIAN: Garner Nash, DO   DIAGNOSIS: The encounter diagnosis was Malignant neoplasm of upper lobe of right lung ([place]).   HISTORY OF PRESENT ILLNESS: Dorothy Gross is a 71 y.o. female seen at the request of Dr. 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 She has been followed for bilateral adrenal nodules and more recently, for a right lower lobe pulmonary nodule.  CTs in the summer 2022 showed multiple bilateral upper and midlung groundglass nodules the largest was in the posterior aspect of the right upper lobe.  She underwent bronchoscopy on 03/23/2021 and multiple brushings and biopsies of the 2 targets in the right upper lobe could not confirm malignancy but the second target showed atypia.  She was followed closely and repeat CTs in November 2022 again showed multiple groundglass pulmonary nodules of varying sizes and solidity but the most worrisome in the right upper lobe along the major fissure measured up to 1.8 cm and posteriorly measured 1.5 cm and was felt to be unchanged.  No evidence of adenopathy was noted.  She underwent repeat bronchoscopy on 09/14/2021, final cytologic assessment showed malignant cells in the fine-needle aspirate of the primary target no malignancy in the brushing, and target 2 again showed atypia.  Given these findings and her history of atrial fibrillation coronary artery disease cardiomyopathy and congestive heart failure complicated by diabetes and hypertension, she is not felt to be an appropriate surgical candidate, she is seen to discuss SBRT to the 2 right upper lobe lesions and for close follow-up of the lesion in the left lung.  She will also be seeing Dr. Julien Nordmann who will follow her long-term  following radiation.    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   Cardiomyopathy ([place])    CHF (congestive heart failure) (H[facility])    Diabetes mellitus without complication ([place])    History of kidney stones    Hyperlipidemia    Hypertension    Lung cancer ([place]) 09/14/2021       PAST SURGICAL HISTORY: Past Surgical History:  Procedure Laterality Date   ABDOMINAL HYSTERECTOMY     APPENDECTOMY     BREAST EXCISIONAL BIOPSY Right 7412,8786   negative 1974 negative 2015   BRONCHIAL BIOPSY  03/23/2021   Procedure: BRONCHIAL BIOPSIES;  Surgeon: Garner Nash, DO;  Location: [facility];  Service: Pulmonary;;   BRONCHIAL BIOPSY  09/14/2021   Procedure: BRONCHIAL BIOPSIES;  Surgeon: Garner Nash, DO;  Location: [place] [facility];  Service: Pulmonary;;   BRONCHIAL BRUSHINGS  03/23/2021   Procedure: BRONCHIAL BRUSHINGS;  Surgeon: Garner Nash, DO;  Location: [place] [facility];  Service: Pulmonary;;   BRONCHIAL BRUSHINGS  09/14/2021   Procedure: BRONCHIAL BRUSHINGS;  Surgeon: Garner Nash, DO;  Location: [place];  Service: Pulmonary;;   BRONCHIAL NEEDLE ASPIRATION BIOPSY  09/14/2021   Procedure: BRONCHIAL NEEDLE ASPIRATION BIOPSIES;  Surgeon: Garner Nash, DO;  Location: [place];  Service: Pulmonary;;   BRONCHIAL WASHINGS  03/23/2021   Procedure:  BRONCHIAL WASHINGS;  Surgeon: Garner Nash, DO;  Location: [place] [facility];  Service: Pulmonary;;   CHOLECYSTECTOMY N/A 12/05/2015   Procedure: LAPAROSCOPIC CHOLECYSTECTOMY WITH INTRAOPERATIVE CHOLANGIOGRAM;  Surgeon: Leonie Green, MD;  Location: [facility];  Service: General;  Laterality: N/A;   COLONOSCOPY WITH PROPOFOL N/A 10/18/2016   Procedure: COLONOSCOPY WITH PROPOFOL;  Surgeon: Manya Silvas, MD;  Location: [facility];  Service: Endoscopy;  Laterality: N/A;    COLONOSCOPY WITH PROPOFOL N/A 03/17/2018   Procedure: COLONOSCOPY WITH PROPOFOL;  Surgeon: Manya Silvas, MD;  Location[facility];  Service: Endoscopy;  Laterality: N/A;   CYSTOSCOPY W/ RETROGRADES Bilateral 03/07/2020   Procedure: CYSTOSCOPY WITH RETROGRADE PYELOGRAM;  Surgeon: Billey Co, MD;  Location: [facility];  Service: Urology;  Laterality: Bilateral;   CYSTOSCOPY/URETEROSCOPY/HOLMIUM LASER/STENT PLACEMENT Bilateral 03/07/2020   Procedure: CYSTOSCOPY/URETEROSCOPY/HOLMIUM LASER/STENT PLACEMENT;  Surgeon: Billey Co, MD;  Location: [facility];  Service: Urology;  Laterality: Bilateral;   FIDUCIAL MARKER PLACEMENT  03/23/2021   Procedure: FIDUCIAL MARKER PLACEMENT;  Surgeon: Garner Nash, DO;  Location: [place] [facility];  Service: Pulmonary;;   JOINT REPLACEMENT     both knees   REPLACEMENT TOTAL KNEE BILATERAL     TONSILLECTOMY     VIDEO BRONCHOSCOPY WITH ENDOBRONCHIAL NAVIGATION Right 03/23/2021   Procedure: VIDEO BRONCHOSCOPY WITH ENDOBRONCHIAL NAVIGATION;  Surgeon: Garner Nash, DO;  Location: [place];  Service: Pulmonary;  Laterality: Right;  ION Case    VIDEO BRONCHOSCOPY WITH RADIAL ENDOBRONCHIAL ULTRASOUND  03/23/2021   Procedure: VIDEO BRONCHOSCOPY WITH RADIAL ENDOBRONCHIAL ULTRASOUND;  Surgeon: Garner Nash, DO;  Location: [place];  Service: Pulmonary;;   VIDEO BRONCHOSCOPY WITH RADIAL ENDOBRONCHIAL ULTRASOUND  09/14/2021   Procedure: RADIAL ENDOBRONCHIAL ULTRASOUND;  Surgeon: Garner Nash, DO;  Location: [facility];  Service: Pulmonary;;     FAMILY HISTORY:  Family History  Problem Relation Age of Onset   Diabetes Mother    Diabetes Sister    Diabetes Maternal Aunt    Cancer Maternal Uncle    Cancer Maternal Uncle    Cancer Maternal Uncle    Cancer Maternal Uncle    Cancer Maternal Uncle    Cancer Maternal Uncle    Cancer Maternal Uncle    Cancer Maternal Uncle    Cancer Maternal Uncle    Breast cancer Neg Hx      SOCIAL  HISTORY:  reports that she quit smoking about 14 years ago. Her smoking use included cigarettes. She has a 3.75 pack-year smoking history. She has never used smokeless tobacco. She reports that she does not drink alcohol and does not use drugs. The patient is divorced and lives in [place]. She is accompanied by her son. 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   ALLERGIES: Patient has no known allergies.   MEDICATIONS:  Current Outpatient Medications  Medication Sig Dispense Refill   acetaminophen (TYLENOL) 500 MG tablet Take 1,000 mg by mouth every 6 (six) hours as needed.     amiodarone (PACERONE) 200 MG tablet Take 200 mg by mouth daily.      apixaban (ELIQUIS) 5 MG TABS tablet Take 5 mg by mouth 2 (two) times daily.      BIOTIN PO Take 1 capsule by mouth daily.     calcium carbonate (OS-CAL - DOSED IN MG OF ELEMENTAL CALCIUM) 1250 (500 Ca) MG tablet Take 1 tablet by mouth daily with breakfast.  carvedilol (COREG) 6.25 MG tablet Take 6.25 mg by mouth 2 (two) times daily with a meal.      Cholecalciferol (VITAMIN D3) 1000 units CAPS Take 1,000 Units by mouth daily.      cyanocobalamin 1000 MCG tablet Take 1,000 mcg by mouth daily.      digoxin (LANOXIN) 0.125 MG tablet Take 0.125 mg by mouth daily.      doxycycline (MONODOX) 100 MG capsule Take 100 mg by mouth 2 (two) times daily.     ferrous sulfate 325 (65 FE) MG tablet Take 325 mg by mouth every other day.     Flaxseed, Linseed, (FLAX SEED OIL PO) Take 2 capsules by mouth daily.     furosemide (LASIX) 40 MG tablet Take 40 mg by mouth daily.      glucose blood (ONETOUCH ULTRA) test strip USE 1 EACH (1 STRIP TOTAL) 3 (THREE) TIMES DAILY     Lancets MISC      losartan (COZAAR) 25 MG tablet Take 50 mg by mouth daily.     metFORMIN (GLUCOPHAGE-XR) 500 MG 24 hr tablet Take 1,000 mg by mouth daily with supper.     Multiple  Vitamins-Minerals (MULTIVITAMIN ADULT PO) Take 1 tablet by mouth daily.     Polyethyl Glycol-Propyl Glycol (SYSTANE OP) Place 1 drop into both eyes daily as needed (dry eyes).     rosuvastatin (CRESTOR) 10 MG tablet Take 10 mg by mouth daily.      sodium chloride (OCEAN) 0.65 % SOLN nasal spray Place 1 spray into both nostrils daily as needed (Dry nose).     zinc gluconate 50 MG tablet Take 50 mg by mouth daily.      No current facility-administered medications for this encounter.     REVIEW OF SYSTEMS: On review of systems, the patient reports that she is doing okay but navigating this diagnosis has been overwhelming. She reports she has not had significant changes in weight unintentionally. She denies any shortness of breath, but has had an occasional cough since her biopsy. No hemoptysis is reported. No other complaints are verbalized.    PHYSICAL EXAM:  Wt Readings from Last 3 Encounters:  09/22/21 195 lb 12.8 oz (88.8 kg)  09/18/21 197 lb 6.4 oz (89.5 kg)  09/14/21 197 lb (89.4 kg)   Temp Readings from Last 3 Encounters:  09/22/21 98.8 F (37.1 C)  09/18/21 (!) 97.5 F (36.4 C) (Oral)  09/14/21 98.5 F (36.9 C)   BP Readings from Last 3 Encounters:  09/22/21 (!) 175/70  09/18/21 (!) 158/76  09/14/21 (!) 145/66   Pulse Readings from Last 3 Encounters:  09/22/21 (!) 58  09/18/21 64  09/14/21 71   Pain Assessment Pain Score: 0-No pain/10  In general this is a well appearing caucasian female in no acute distress. She's alert and oriented x4 and appropriate throughout the examination. Cardiopulmonary assessment is negative for acute distress and she exhibits normal effort.    ECOG = 0  0 - Asymptomatic (Fully active, able to carry on all predisease activities without restriction)  1 - Symptomatic but completely ambulatory (Restricted in physically strenuous activity but ambulatory and able to carry out work of a light or sedentary nature. For example, light housework,  office work)  2 - Symptomatic, <50% in bed during the day (Ambulatory and capable of all self care but unable to carry out any work activities. Up and about more than 50% of waking hours)  3 - Symptomatic, >50% in bed, but not  bedbound (Capable of only limited self-care, confined to bed or chair 50% or more of waking hours)  4 - Bedbound (Completely disabled. Cannot carry on any self-care. Totally confined to bed or chair)  5 - Death   Eustace Pen MM, Creech RH, Tormey DC, et al. [phone]). "Toxicity and response criteria of the [facility] Group". [place] Oncol. 5 (6): 649-55    LABORATORY DATA:  Lab Results  Component Value Date   WBC 8.0 09/14/2021   HGB 12.9 09/14/2021   HCT 41.9 09/14/2021   MCV 88.2 09/14/2021   PLT 267 09/14/2021   Lab Results  Component Value Date   NA 138 09/14/2021   K 3.8 09/14/2021   CL 102 09/14/2021   CO2 24 09/14/2021   No results found for: ALT, AST, GGT, ALKPHOS, BILITOT    RADIOGRAPHY: DG Chest Port 1 View  Result Date: 09/14/2021 CLINICAL DATA:  Status post bronchoscopy and biopsy. EXAM: PORTABLE CHEST 1 VIEW COMPARISON:  Chest x-ray dated March 23, 2021. FINDINGS: The heart size and mediastinal contours are within normal limits. Normal pulmonary vascularity. Unchanged fiducial markers in the right upper lobe. New 4.0 cm round mass-like opacity in the parahilar right upper lobe. Chronic mildly coarsened interstitial markings at the peripheral lung bases. Mild right basilar atelectasis. No pleural effusion or pneumothorax. No acute osseous abnormality. IMPRESSION: 1. New 4.0 cm round mass-like opacity in the parahilar right upper lobe likely reflects a small amount of post biopsy hemorrhage. No pneumothorax. Electronically Signed   By: Titus Dubin M.D.   On: 09/14/2021 11:49   DG C-Arm 1-60 Min-No Report  Result Date: 09/14/2021 Fluoroscopy was utilized by the requesting physician.  No radiographic interpretation.   DG C-ARM  BRONCHOSCOPY  Result Date: 09/14/2021 C-ARM BRONCHOSCOPY: Fluoroscopy was utilized by the requesting physician.  No radiographic interpretation.       IMPRESSION/PLAN: 1. Multifocal Stage IA2, cT1bN0M0, NSCLC, adenocarcinoma of the RUL nodules. Dr. Lisbeth Renshaw discusses the pathology findings and reviews the nature of early stage lung cancer.  Dr. Lisbeth Renshaw reviews  the standard of care is for surgical resection. However for patients who are not medical candidates to undergo surgery, or who choose to forgo surgery, stereotactic body radiotherapy (SBRT) is an appropriate alternative.  We discussed the risks, benefits, short, and long term effects of radiotherapy, as well as the curative intent, and the patient is interested in proceeding. Dr. Lisbeth Renshaw discusses the delivery and logistics of radiotherapy and anticipates a course of 3-5 fractions, likely 5 fractions based on location of disease. Written consent is obtained and placed in the chart, a copy was provided to the patient. She will be contacted to coordinate simulation by our staff. 2. Left lung nodules. She will be followed closely with Dr. Julien Nordmann in surveillance.   In a visit lasting 60 minutes, greater than 50% of the time was spent face to face discussing the patient's condition, in preparation for the discussion, and coordinating the patient's care.   The above documentation reflects my direct findings during this shared patient visit. Please see the separate note by Dr. Lisbeth Renshaw on this date for the remainder of the patient's plan of care.    Carola Rhine, [facility]   **Disclaimer: This note was dictated with voice recognition software. Similar sounding words can inadvertently be transcribed and this note may contain transcription errors which may not have been corrected upon publication of note.**

## 2021-09-22 ENCOUNTER — Other Ambulatory Visit: Payer: Self-pay

## 2021-09-22 ENCOUNTER — Encounter: Payer: Self-pay | Admitting: Radiation Oncology

## 2021-09-22 ENCOUNTER — Ambulatory Visit
Admission: RE | Admit: 2021-09-22 | Discharge: 2021-09-22 | Disposition: A | Discharge status: Home / Self Care | Payer: Medicare HMO | Source: Ambulatory Visit | Attending: Radiation Oncology | Admitting: Radiation Oncology

## 2021-09-22 VITALS — BP 175/70 | HR 58 | Temp 98.8°F | Resp 20 | Ht 64.0 in | Wt 195.8 lb

## 2021-09-22 DIAGNOSIS — R918 Other nonspecific abnormal finding of lung field: Secondary | ICD-10-CM | POA: Diagnosis not present

## 2021-09-22 DIAGNOSIS — E785 Hyperlipidemia, unspecified: Secondary | ICD-10-CM | POA: Insufficient documentation

## 2021-09-22 DIAGNOSIS — I110 Hypertensive heart disease with heart failure: Secondary | ICD-10-CM | POA: Insufficient documentation

## 2021-09-22 DIAGNOSIS — I509 Heart failure, unspecified: Secondary | ICD-10-CM | POA: Insufficient documentation

## 2021-09-22 DIAGNOSIS — Z7901 Long term (current) use of anticoagulants: Secondary | ICD-10-CM | POA: Diagnosis not present

## 2021-09-22 DIAGNOSIS — Z7984 Long term (current) use of oral hypoglycemic drugs: Secondary | ICD-10-CM | POA: Diagnosis not present

## 2021-09-22 DIAGNOSIS — I429 Cardiomyopathy, unspecified: Secondary | ICD-10-CM | POA: Diagnosis not present

## 2021-09-22 DIAGNOSIS — C3411 Malignant neoplasm of upper lobe, right bronchus or lung: Secondary | ICD-10-CM | POA: Diagnosis present

## 2021-09-22 DIAGNOSIS — E119 Type 2 diabetes mellitus without complications: Secondary | ICD-10-CM | POA: Diagnosis not present

## 2021-09-22 DIAGNOSIS — I4891 Unspecified atrial fibrillation: Secondary | ICD-10-CM | POA: Diagnosis not present

## 2021-09-22 DIAGNOSIS — Z79899 Other long term (current) drug therapy: Secondary | ICD-10-CM | POA: Diagnosis not present

## 2021-09-22 DIAGNOSIS — Z87891 Personal history of nicotine dependence: Secondary | ICD-10-CM | POA: Diagnosis not present

## 2021-09-22 DIAGNOSIS — I2510 Atherosclerotic heart disease of native coronary artery without angina pectoris: Secondary | ICD-10-CM | POA: Diagnosis not present

## 2021-09-22 NOTE — Addendum Note (Signed)
Encounter addended by: Hayden Pedro, PA-C on: 09/22/2021 12:24 PM  Actions taken: In Basket message sent

## 2021-09-29 ENCOUNTER — Telehealth: Payer: Self-pay | Admitting: Radiation Oncology

## 2021-09-29 NOTE — Telephone Encounter (Signed)
I called the patient to let her know our plans were still in place for SBRT simulation this week. Her case was discussed in thoracic oncology conference. While our plan we reviewed with pulmonary was for close surveillance after SBRT, Dr. Julien Nordmann may also offer additional treatment at the conclusion of radiation. She is in agreement to discuss this with him during her visit with him next week.

## 2021-10-02 ENCOUNTER — Other Ambulatory Visit: Payer: Self-pay

## 2021-10-02 ENCOUNTER — Ambulatory Visit
Admission: RE | Admit: 2021-10-02 | Discharge: 2021-10-02 | Disposition: A | Discharge status: Home / Self Care | Payer: Medicare HMO | Source: Ambulatory Visit | Attending: Radiation Oncology | Admitting: Radiation Oncology

## 2021-10-02 DIAGNOSIS — C3411 Malignant neoplasm of upper lobe, right bronchus or lung: Secondary | ICD-10-CM | POA: Insufficient documentation

## 2021-10-02 DIAGNOSIS — Z510 Encounter for antineoplastic radiation therapy: Secondary | ICD-10-CM | POA: Diagnosis not present

## 2021-10-06 ENCOUNTER — Inpatient Hospital Stay: Payer: Medicare HMO

## 2021-10-06 ENCOUNTER — Other Ambulatory Visit: Payer: Self-pay | Admitting: *Deleted

## 2021-10-06 ENCOUNTER — Encounter: Payer: Self-pay | Admitting: *Deleted

## 2021-10-06 ENCOUNTER — Other Ambulatory Visit: Payer: Self-pay

## 2021-10-06 ENCOUNTER — Inpatient Hospital Stay: Payer: Medicare HMO | Attending: Internal Medicine | Admitting: Internal Medicine

## 2021-10-06 VITALS — BP 180/71 | HR 64 | Temp 97.2°F | Resp 17 | Wt 195.1 lb

## 2021-10-06 DIAGNOSIS — R918 Other nonspecific abnormal finding of lung field: Secondary | ICD-10-CM

## 2021-10-06 DIAGNOSIS — E119 Type 2 diabetes mellitus without complications: Secondary | ICD-10-CM

## 2021-10-06 DIAGNOSIS — Z87891 Personal history of nicotine dependence: Secondary | ICD-10-CM

## 2021-10-06 DIAGNOSIS — C3411 Malignant neoplasm of upper lobe, right bronchus or lung: Secondary | ICD-10-CM | POA: Diagnosis present

## 2021-10-06 DIAGNOSIS — I10 Essential (primary) hypertension: Secondary | ICD-10-CM | POA: Diagnosis not present

## 2021-10-06 DIAGNOSIS — Z809 Family history of malignant neoplasm, unspecified: Secondary | ICD-10-CM

## 2021-10-06 DIAGNOSIS — Z90710 Acquired absence of both cervix and uterus: Secondary | ICD-10-CM

## 2021-10-06 DIAGNOSIS — C3490 Malignant neoplasm of unspecified part of unspecified bronchus or lung: Secondary | ICD-10-CM

## 2021-10-06 LAB — CBC WITH DIFFERENTIAL (CANCER CENTER ONLY)
Abs Immature Granulocytes: 0.03 10*3/uL (ref 0.00–0.07)
Basophils Absolute: 0.1 10*3/uL (ref 0.0–0.1)
Basophils Relative: 1 %
Eosinophils Absolute: 0.1 10*3/uL (ref 0.0–0.5)
Eosinophils Relative: 2 %
HCT: 41.2 % (ref 36.0–46.0)
Hemoglobin: 13.5 g/dL (ref 12.0–15.0)
Immature Granulocytes: 0 %
Lymphocytes Relative: 24 %
Lymphs Abs: 2.1 10*3/uL (ref 0.7–4.0)
MCH: 27.9 pg (ref 26.0–34.0)
MCHC: 32.8 g/dL (ref 30.0–36.0)
MCV: 85.1 fL (ref 80.0–100.0)
Monocytes Absolute: 0.6 10*3/uL (ref 0.1–1.0)
Monocytes Relative: 7 %
Neutro Abs: 5.8 10*3/uL (ref 1.7–7.7)
Neutrophils Relative %: 66 %
Platelet Count: 268 10*3/uL (ref 150–400)
RBC: 4.84 MIL/uL (ref 3.87–5.11)
RDW: 14.7 % (ref 11.5–15.5)
WBC Count: 8.7 10*3/uL (ref 4.0–10.5)
nRBC: 0 % (ref 0.0–0.2)

## 2021-10-06 LAB — CMP (CANCER CENTER ONLY)
ALT: 44 U/L (ref 0–44)
AST: 33 U/L (ref 15–41)
Albumin: 4.3 g/dL (ref 3.5–5.0)
Alkaline Phosphatase: 87 U/L (ref 38–126)
Anion gap: 8 (ref 5–15)
BUN: 10 mg/dL (ref 8–23)
CO2: 31 mmol/L (ref 22–32)
Calcium: 9.3 mg/dL (ref 8.9–10.3)
Chloride: 102 mmol/L (ref 98–111)
Creatinine: 0.71 mg/dL (ref 0.44–1.00)
GFR, Estimated: >60 mL/min (ref >60)
Glucose, Bld: 133 mg/dL — ABNORMAL HIGH (ref 70–99)
Potassium: 4 mmol/L (ref 3.5–5.1)
Sodium: 141 mmol/L (ref 135–145)
Total Bilirubin: 0.7 mg/dL (ref 0.3–1.2)
Total Protein: 7.4 g/dL (ref 6.5–8.1)

## 2021-10-06 NOTE — Patient Instructions (Signed)
Lung Cancer Lung cancer is an abnormal growth of cancerous cells that forms a mass (malignant tumor) in a lung. There are several types of lung cancer. The types are based on the appearance of the tumor cells. The two most common types are: Non-small cell lung cancer. This type of lung cancer is the most common type. Non-small cell lung cancers include squamous cell carcinoma, adenocarcinoma, and large cell carcinoma. Small cell lung cancer. In this type of lung cancer, abnormal cells are smaller than those of non-small cell lung cancer. Small cell lung cancer gets worse (progresses) faster than non-small cell lung cancer. What are the causes? The most common cause of lung cancer is smoking tobacco. The second most common cause is exposure to a chemical called radon. What increases the risk? You are more likely to develop this condition if: You smoke tobacco. You have been exposed to: Secondhand tobacco smoke. Radon gas. Uranium. Asbestos. Arsenic in drinking water. Air pollution and diesel exhaust. You have a family or personal history of lung cancer. You have had lung radiation therapy in the past. You are older than age 28. What are the signs or symptoms? In the early stages, you may not have any symptoms. As the cancer progresses, symptoms may include: A lasting cough, possibly with blood. Fatigue. Unexplained weight loss. Shortness of breath. High-pitched whistling sounds when you breathe, most often when you breathe out (wheezing). Chest pain. Loss of appetite. Symptoms of advanced lung cancer include: Hoarseness. Bone or joint pain. Weakness. Change in the structure of the fingernails (clubbing), so that the nail looks like an upside-down spoon. Swelling of the face or arms. Inability to move the face (paralysis). Drooping eyelids. How is this diagnosed? This condition may be diagnosed based on: Your symptoms and medical history. A physical exam. A chest X-ray. A CT  scan. Blood tests. Sputum tests. Removal of a sample of lung tissue (lung biopsy) for testing. Your cancer will be assessed (staged) to determine how severe it is and how much it has spread (metastasized). How is this treated? Treatment depends on the type and stage of your cancer. Treatment may include one or more of the following: Surgery to remove as much of the cancer as possible. Lymph nodes in the area may be removed and tested for cancer as well. Medicines that kill cancer cells (chemotherapy). High-energy rays that kill cancer cells (radiation therapy). Targeted therapy. This targets specific parts of cancer cells and the area around them to block the growth and spread of the cancer. Targeted therapy can help limit the damage to healthy cells. Immunotherapy. This treatment uses a person's own immune system to fight cancer by either boosting the immune system or changing how the immune system works. Follow these instructions at home:  Do not use any products that contain nicotine or tobacco. These products include cigarettes, chewing tobacco, and vaping devices, such as e-cigarettes. If you need help quitting, ask your health care provider. Do not drink alcohol. If you are admitted to the hospital, make sure your cancer specialist (oncologist) is aware. Your cancer may affect your treatment for other conditions. Take over-the-counter and prescription medicines only as told by your health care provider. Work with your health care provider to manage any side effects of treatment. Keep all follow-up visits. This is important. Where to find support Consider joining a local support group for people who have been diagnosed with lung cancer. Where to find more information American Cancer Society: www.cancer.[place] ([place]):  www.cancer.gov Contact a health care provider if you: Lose weight without trying. Have a persistent cough and wheezing. Feel short of breath. Get  tired easily. Have bone or joint pain. Have difficulty swallowing. Notice that your voice is changing or getting hoarse. Have pain that does not get better with medicine. Get help right away if you: Cough up blood. Have chest pain or new breathing problems. Have a fever. Have swelling in an ankle, leg, or arm, or the face or neck. Have paralysis in your face. Are very confused. Have a drooping eyelid. These symptoms may represent a serious problem that is an emergency. Do not wait to see if the symptoms will go away. Get medical help right away. Call your local emergency services (911 in the [place] Do not drive yourself to the hospital. Summary Lung cancer is an abnormal growth of cancerous cells that forms a mass (malignant tumor) in a lung. There are several types of lung cancer. The types are based on the appearance of the tumor cells. The two most common types are non-small cell and small cell. The most common cause of lung cancer is smoking tobacco. Early symptoms include a lasting cough, possibly with blood, and fatigue, unexplained weight loss, and shortness of breath. After diagnosis, treatment depends on the type and stage of your cancer. This information is not intended to replace advice given to you by your health care provider. Make sure you discuss any questions you have with your health care provider. Document Revised: 02/04/2021 Document Reviewed: 02/04/2021 Elsevier Patient Education  [place]. PET Scan A PET scan, or positron emission tomography, is an imaging test. A large scanner with a camera creates pictures of your organs and tissues. The pictures are used to study and detect diseases such as cancer. For this test, a tiny amount of radioactive material, called a tracer, will be injected into your blood. The tracer will travel through your bloodstream and create colors and brightness on the pictures if there are problems. On a PET scan image, for example,  cancer tissue appears brighter than normal tissue. Tell a health care provider about: Any allergies you have. All medicines you are taking, including vitamins, herbs, eye drops, creams, and over-the-counter medicines. Any blood disorders you have. Any surgeries you have had. Any medical conditions you have. Any fear of cramped spaces (claustrophobia). You may be given medicine to help you relax (sedative) or a medicine to treat anxiety if this is a problem. Any trouble you have staying still for long periods of time. Whether you are pregnant, may be pregnant, or are breastfeeding. What are the risks? Generally, this is a safe test. However, problems may occur, including: Bleeding, pain, or swelling at the IV site. Allergic reaction to the radioactive tracer. This is rare. Exposure to radiation (a small amount). What happens before the procedure? Follow instructions from your health care provider about eating or drinking restrictions. Ask your health care provider about: Changing or stopping your regular medicines. This is especially important if you are taking diabetes medicines or blood thinners. Taking medicines such as aspirin and ibuprofen. These medicines can thin your blood. Do not take these medicines unless your health care provider tells you to take them. Taking over-the-counter medicines, vitamins, herbs, and supplements. If you have diabetes, ask your health care provider for diet guidelines to control your blood sugar (glucose) levels on the day of the test. What happens during the procedure?  An IV will be inserted into one of  your veins. You may be given a sedative through the IV. A small amount of radioactive tracer will be injected through the IV. You will wait 30-60 minutes after the injection. This allows the tracer to travel through your body. You will lie on a cushioned table. The table will move through the center of the scanning machine. The scanning machine will  create pictures of your body. This can last for about 30-60 minutes. You will need to stay very still during this time. The procedure may vary among health care providers and hospitals. What can I expect after the procedure? You may be taken to a recovery area if sedation medicines were used. Your blood pressure, heart rate, breathing rate, and blood oxygen level will be monitored until you leave the hospital or clinic. It is up to you to get your test results. Ask your health care provider, or the department that is doing the test, when your results will be ready. Also ask: How will I get my results? What are my treatment options? What other tests do I need? What are my next steps? Follow these instructions at home: Drink 6-8 glasses of water after the test to flush the radioactive tracer out of your body. Drink enough fluid to keep your urine pale yellow. You may return to your normal diet and activities as told by your health care provider. Summary A PET scan, or positron emission tomography, is an imaging test that creates pictures of your organs and tissues. The pictures are used to study and detect diseases such as cancer. For this test, a small amount of radioactive tracer will be injected and travel through your bloodstream. You will lie flat on a table and be very still. The table will move through the scanning machine that creates pictures of your body. If the images show colors and brightness, that is a sign of a problem in that area. Ask your health care provider what your results mean. This information is not intended to replace advice given to you by your health care provider. Make sure you discuss any questions you have with your health care provider. Document Revised: 04/29/2021 Document Reviewed: 12/22/2019 Elsevier Patient Education  2022 [employer].

## 2021-10-06 NOTE — Progress Notes (Signed)
Oncology Nurse Navigator Documentation  Oncology Nurse Navigator Flowsheets 10/06/2021  Abnormal Finding Date 07/02/2021  Confirmed Diagnosis Date 09/14/2021  Diagnosis Status Pending Molecular Studies  Planned Course of Treatment Radiation  Phase of Treatment Radiation  Radiation Actual Start Date: 10/13/2021  Navigator Follow Up Date: 10/08/2021  Navigator Follow Up Reason: Pathology  Navigator Location [facility]  Navigator Encounter Type Clinic/MDC;Initial MedOnc  Treatment Initiated Date 10/13/2021  Patient Visit Type Initial;MedOnc  Barriers/Navigation Needs Education;Coordination of Care/I met Ms. Prosise today at her first visit to see medical oncology.  Patient is newly dx lung cancer.  She will be getting radiation and staging work up.  I helped to explain her plan of care with scans and tissue testing.  Dr. Julien Nordmann asked that I notify pathology to see if there is enough tissue to send for molecular and PDL 1 testing. I did explain to patient that she may need blood test if tissue is inadequate. She verbalized understanding.    Education Newly Diagnosed Cancer Education;Other  Interventions Coordination of Care;Education  Acuity Level 3-Moderate Needs (3-4 Barriers Identified)  Coordination of Care Pathology  Education Method Verbal;Other  Time Spent with Patient 15

## 2021-10-06 NOTE — Progress Notes (Signed)
[place] [phone]   [phone]  CONSULT NOTE  REFERRING PHYSICIAN: Dr. Leory Plowman Icard  REASON FOR CONSULTATION:  71 years old white female with lung cancer.  HPI Dorothy Gross is a 71 y.o. female with past medical history significant for atrial fibrillation, coronary artery disease, cardiomyopathy, hypertension, dyslipidemia as well as diabetes mellitus and history of smoking but quit in 2009.  The patient mentioned that in May 2021 she had hematuria and suspicious kidney stone.  She had CT scan of the abdomen and pelvis at that time that showed mild adrenal nodularity bilaterally.  This was followed by CT scan of the abdomen pelvis on July 25, 2020 and it showed stable appearance of the bilateral adrenal nodules suggesting a benign abnormality.  There was incidental finding of 0.4 cm nodule within the right lower lobe.  CT scan of the chest on September 25, 2020 showed bilateral groundglass pulmonary nodules suspicious for atypical infection or malignancy.  The patient was followed by repeat imaging studies of the chest and CT super D of the chest on 03/16/2021 showed multiple pulmonary nodules demonstrating some slow growth over prior examination with the largest of these in the posterior aspect of the right upper lobe measuring 1.8 x 1.8 cm with a central solid component of 0.8 cm highly concerning for primary bronchogenic adenocarcinoma.  The patient was seen by Dr. Valeta Harms and she underwent flexible video fiberoptic bronchoscopy with robotic assistance and biopsies on March 23, 2021.  The cytology at that time showed atypical cells in the right upper lobe but no other evidence of malignancy.  The patient was followed by observation.  Repeat CT super D of the chest on July 02, 2021 showed multiple ground glass pulmonary nodules of varying size and solid CT throughout the bilateral lungs unchanged compared to the prior exam.  The largest and  morphologically most worrisome nodule is a subsolid nodule of the posterior right upper lobe which abuts and tense the major fissure measuring 1.8 x 1.7 cm.  An additional subsolid the index nodule of the posterior lateral right apex is unchanged and measured 1.5 x 1.1 cm.  A mixed solid and groundglass nodule of the anterior right lower lobe measured 0.8 x 0.6 cm with central nodular component of 0.5 cm.  This nodule has remained concerning for indolent adenocarcinoma.  The patient had repeat flexible video fiberoptic bronchoscopy with robotic assistant and biopsies under the care of Dr. Valeta Harms on September 14, 2021.  The final pathology ([id number]) from the fine-needle aspiration of the right upper lobe showed malignant cells consistent with adenocarcinoma.  Other nodule in the right upper lobe showed atypical cells.  The patient was referred to Dr. Lisbeth Renshaw as well as to me for evaluation and recommendation regarding her condition. Dr. Lisbeth Renshaw is planning for SBRT to the suspicious pulmonary nodules in the right lung. When seen today the patient is feeling fine except for an anxiety about her condition.  She denied having any current chest pain, shortness of breath, cough or hemoptysis.  She has no nausea, vomiting, diarrhea or constipation.  She has no headache or visual changes.  She has no weight loss or night sweats. Family history significant for mother and sister with diabetes.  Father with heart disease.  Several uncles with cancer. The patient is divorced and has 2 children a son Dorothy Gross who accompanied her to the clinic today and a daughter Caryl Pina.  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  Patient has a history of smoking 1 pack/day for around 15 years but quit in 2009.  She has no history of alcohol or drug abuse.  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    Cardiomyopathy ([place])    CHF (congestive heart failure) ([facility])    Diabetes mellitus without complication ([place])    History of kidney stones    Hyperlipidemia    Hypertension    Lung cancer ([place]) 09/14/2021    Past Surgical History:  Procedure Laterality Date   ABDOMINAL HYSTERECTOMY     APPENDECTOMY     BREAST EXCISIONAL BIOPSY Right [id number]   negative 1974 negative 2015   BRONCHIAL BIOPSY  03/23/2021   Procedure: BRONCHIAL BIOPSIES;  Surgeon: Garner Nash, DO;  Location: [place] [facility]OSCOPY;  Service: Pulmonary;;   BRONCHIAL BIOPSY  09/14/2021   Procedure: BRONCHIAL BIOPSIES;  Surgeon: Garner Nash, DO;  Location: [place] [facility];  Service: Pulmonary;;   BRONCHIAL BRUSHINGS  03/23/2021   Procedure: BRONCHIAL BRUSHINGS;  Surgeon: Garner Nash, DO;  Location: [place] [facility];  Service: Pulmonary;;   BRONCHIAL BRUSHINGS  09/14/2021   Procedure: BRONCHIAL BRUSHINGS;  Surgeon: Garner Nash, DO;  Location: [facility];  Service: Pulmonary;;   BRONCHIAL NEEDLE ASPIRATION BIOPSY  09/14/2021   Procedure: BRONCHIAL NEEDLE ASPIRATION BIOPSIES;  Surgeon: Garner Nash, DO;  Location: [place] [facility];  Service: Pulmonary;;   BRONCHIAL WASHINGS  03/23/2021   Procedure: BRONCHIAL WASHINGS;  Surgeon: Garner Nash, DO;  Location: [place] [facility];  Service: Pulmonary;;   CHOLECYSTECTOMY N/A 12/05/2015   Procedure: LAPAROSCOPIC CHOLECYSTECTOMY WITH INTRAOPERATIVE CHOLANGIOGRAM;  Surgeon: Leonie Green, MD;  Location: [facility] ORS;  Service: General;  Laterality: N/A;   COLONOSCOPY WITH PROPOFOL N/A 10/18/2016   Procedure: COLONOSCOPY WITH PROPOFOL;  Surgeon: Manya Silvas, MD;  Location: [facility];  Service: Endoscopy;  Laterality: N/A;   COLONOSCOPY WITH PROPOFOL N/A 03/17/2018   Procedure: COLONOSCOPY WITH PROPOFOL;  Surgeon: Manya Silvas, MD;  Location: [facility] ENDOSCOPY;  Service: Endoscopy;  Laterality: N/A;   CYSTOSCOPY W/ RETROGRADES Bilateral 03/07/2020   Procedure: CYSTOSCOPY WITH  RETROGRADE PYELOGRAM;  Surgeon: Billey Co, MD;  Location: [facility] ORS;  Service: Urology;  Laterality: Bilateral;   CYSTOSCOPY/URETEROSCOPY/HOLMIUM LASER/STENT PLACEMENT Bilateral 03/07/2020   Procedure: CYSTOSCOPY/URETEROSCOPY/HOLMIUM LASER/STENT PLACEMENT;  Surgeon: Billey Co, MD;  Location: [facility] ORS;  Service: Urology;  Laterality: Bilateral;   FIDUCIAL MARKER PLACEMENT  03/23/2021   Procedure: FIDUCIAL MARKER PLACEMENT;  Surgeon: Garner Nash, DO;  Location: [place]OSCOPY;  Service: Pulmonary;;   JOINT REPLACEMENT     both knees   REPLACEMENT TOTAL KNEE BILATERAL     TONSILLECTOMY     VIDEO BRONCHOSCOPY WITH ENDOBRONCHIAL NAVIGATION Right 03/23/2021   Procedure: VIDEO BRONCHOSCOPY WITH ENDOBRONCHIAL NAVIGATION;  Surgeon: Garner Nash, DO;  Location: [place];  Service: Pulmonary;  Laterality: Right;  ION Case    VIDEO BRONCHOSCOPY WITH RADIAL ENDOBRONCHIAL ULTRASOUND  03/23/2021   Procedure: VIDEO BRONCHOSCOPY WITH RADIAL ENDOBRONCHIAL ULTRASOUND;  Surgeon: Garner Nash, DO;  Location: [place];  Service: Pulmonary;;   VIDEO BRONCHOSCOPY WITH RADIAL ENDOBRONCHIAL ULTRASOUND  09/14/2021   Procedure: RADIAL ENDOBRONCHIAL ULTRASOUND;  Surgeon: Garner Nash, DO;  Location: [facility];  Service: Pulmonary;;    Family History  Problem Relation Age of Onset   Diabetes Mother  Diabetes Sister    Diabetes Maternal Aunt    Cancer Maternal Uncle    Cancer Maternal Uncle    Cancer Maternal Uncle    Cancer Maternal Uncle    Cancer Maternal Uncle    Cancer Maternal Uncle    Cancer Maternal Uncle    Cancer Maternal Uncle    Cancer Maternal Uncle    Breast cancer Neg Hx     Social History Social History   Tobacco Use   Smoking status: Former    Packs/day: 0.25    Years: 15.00    Pack years: 3.75    Types: Cigarettes    Quit date: 08/31/2007    Years since quitting: 14.1   Smokeless tobacco: Never  Vaping Use   Vaping Use: Never used  Substance Use  Topics   Alcohol use: No    Alcohol/week: 0.0 standard drinks   Drug use: No    No Known Allergies  Current Outpatient Medications  Medication Sig Dispense Refill   acetaminophen (TYLENOL) 500 MG tablet Take 1,000 mg by mouth every 6 (six) hours as needed.     amiodarone (PACERONE) 200 MG tablet Take 200 mg by mouth daily.      apixaban (ELIQUIS) 5 MG TABS tablet Take 5 mg by mouth 2 (two) times daily.      BIOTIN PO Take 1 capsule by mouth daily.     calcium carbonate (OS-CAL - DOSED IN MG OF ELEMENTAL CALCIUM) 1250 (500 Ca) MG tablet Take 1 tablet by mouth daily with breakfast.     carvedilol (COREG) 6.25 MG tablet Take 6.25 mg by mouth 2 (two) times daily with a meal.      Cholecalciferol (VITAMIN D3) 1000 units CAPS Take 1,000 Units by mouth daily.      cyanocobalamin 1000 MCG tablet Take 1,000 mcg by mouth daily.      digoxin (LANOXIN) 0.125 MG tablet Take 0.125 mg by mouth daily.      doxycycline (MONODOX) 100 MG capsule Take 100 mg by mouth 2 (two) times daily.     ferrous sulfate 325 (65 FE) MG tablet Take 325 mg by mouth every other day.     Flaxseed, Linseed, (FLAX SEED OIL PO) Take 2 capsules by mouth daily.     furosemide (LASIX) 40 MG tablet Take 40 mg by mouth daily.      glucose blood (ONETOUCH ULTRA) test strip USE 1 EACH (1 STRIP TOTAL) 3 (THREE) TIMES DAILY     Lancets MISC      losartan (COZAAR) 25 MG tablet Take 50 mg by mouth daily.     metFORMIN (GLUCOPHAGE-XR) 500 MG 24 hr tablet Take 1,000 mg by mouth daily with supper.     Multiple Vitamins-Minerals (MULTIVITAMIN ADULT PO) Take 1 tablet by mouth daily.     Polyethyl Glycol-Propyl Glycol (SYSTANE OP) Place 1 drop into both eyes daily as needed (dry eyes).     rosuvastatin (CRESTOR) 10 MG tablet Take 10 mg by mouth daily.      sodium chloride (OCEAN) 0.65 % SOLN nasal spray Place 1 spray into both nostrils daily as needed (Dry nose).     zinc gluconate 50 MG tablet Take 50 mg by mouth daily.      No current  facility-administered medications for this visit.    Review of Systems  Constitutional: positive for fatigue Eyes: negative Ears, nose, mouth, throat, and face: negative Respiratory: negative Cardiovascular: negative Gastrointestinal: negative Genitourinary:negative Integument/breast: negative Hematologic/lymphatic: negative Musculoskeletal:negative Neurological: negative  Behavioral/Psych: positive for anxiety Endocrine: negative Allergic/Immunologic: negative  Physical Exam  XIP:JASNK, healthy, no distress, well nourished, well developed, and anxious SKIN: skin color, texture, turgor are normal, no rashes or significant lesions HEAD: Normocephalic, No masses, lesions, tenderness or abnormalities EYES: normal, PERRLA, Conjunctiva are pink and non-injected EARS: External ears normal, Canals clear OROPHARYNX:no exudate, no erythema, and lips, buccal mucosa, and tongue normal  NECK: supple, no adenopathy, no JVD LYMPH:  no palpable lymphadenopathy, no hepatosplenomegaly BREAST:not examined LUNGS: clear to auscultation , and palpation HEART: regular rate & rhythm, no murmurs, and no gallops ABDOMEN:abdomen soft, non-tender, normal bowel sounds, and no masses or organomegaly BACK: Back symmetric, no curvature., No CVA tenderness EXTREMITIES:no joint deformities, effusion, or inflammation, no edema  NEURO: alert & oriented x 3 with fluent speech, no focal motor/sensory deficits  PERFORMANCE STATUS: ECOG 1  LABORATORY DATA: Lab Results  Component Value Date   WBC 8.7 10/06/2021   HGB 13.5 10/06/2021   HCT 41.2 10/06/2021   MCV 85.1 10/06/2021   PLT 268 10/06/2021      Chemistry      Component Value Date/Time   NA 141 10/06/2021 1406   NA 136 02/12/2013 0606   K 4.0 10/06/2021 1406   K 4.4 02/12/2013 0606   CL 102 10/06/2021 1406   CL 101 02/12/2013 0606   CO2 31 10/06/2021 1406   CO2 30 02/12/2013 0606   BUN 10 10/06/2021 1406   BUN 25 (H) 02/12/2013 0606    CREATININE 0.71 10/06/2021 1406   CREATININE 0.94 02/12/2013 0606      Component Value Date/Time   CALCIUM 9.3 10/06/2021 1406   CALCIUM 8.8 02/12/2013 0606   ALKPHOS 87 10/06/2021 1406   AST 33 10/06/2021 1406   ALT 44 10/06/2021 1406   BILITOT 0.7 10/06/2021 1406       RADIOGRAPHIC STUDIES: DG Chest Port 1 View  Result Date: 09/14/2021 CLINICAL DATA:  Status post bronchoscopy and biopsy. EXAM: PORTABLE CHEST 1 VIEW COMPARISON:  Chest x-ray dated March 23, 2021. FINDINGS: The heart size and mediastinal contours are within normal limits. Normal pulmonary vascularity. Unchanged fiducial markers in the right upper lobe. New 4.0 cm round mass-like opacity in the parahilar right upper lobe. Chronic mildly coarsened interstitial markings at the peripheral lung bases. Mild right basilar atelectasis. No pleural effusion or pneumothorax. No acute osseous abnormality. IMPRESSION: 1. New 4.0 cm round mass-like opacity in the parahilar right upper lobe likely reflects a small amount of post biopsy hemorrhage. No pneumothorax. Electronically Signed   By: Titus Dubin M.D.   On: 09/14/2021 11:49   DG C-Arm 1-60 Min-No Report  Result Date: 09/14/2021 Fluoroscopy was utilized by the requesting physician.  No radiographic interpretation.   DG C-ARM BRONCHOSCOPY  Result Date: 09/14/2021 C-ARM BRONCHOSCOPY: Fluoroscopy was utilized by the requesting physician.  No radiographic interpretation.    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   PLAN: I had a lengthy discussion with the patient and her son today about her current condition and treatment options. I personally and independently reviewed the scan images and discussed the result and showed the images to the patient and her son. I recommended for the  patient to complete the staging work-up by ordering MRI of the brain and also a PET scan to rule out any other  metastatic disease.  I understand that many of these nodules may be suboptimal for evaluation by the PET scan but will also want to make sure that we are not missing any other additional lesions. I will also request her tissue [facility] for molecular studies.  If the material is insufficient for molecular studies, may consider her for blood test sent to [facility]. I recommended for the patient to proceed with the SBRT as recommended by Dr. Lisbeth Renshaw for the suspicious right upper lobe lung nodules. I will see her back for follow-up visit in 1 months for evaluation and further recommendation regarding her condition based on the final staging work-up and molecular studies. The patient was advised to call immediately if she has any other concerning symptoms in the interval.  The patient voices understanding of current disease status and treatment options and is in agreement with the current care plan.  All questions were answered. The patient knows to call the clinic with any problems, questions or concerns. We can certainly see the patient much sooner if necessary.  Thank you so much for allowing me to participate in the care of Jeyda A Walgren. I will continue to follow up the patient with you and assist in her care.  The total time spent in the appointment was 60 minutes.  Disclaimer: This note was dictated with voice recognition software. Similar sounding words can inadvertently be transcribed and may not be corrected upon review.   Eilleen Kempf October 06, 2021, 3:02 PM

## 2021-10-07 ENCOUNTER — Encounter: Payer: Self-pay | Admitting: *Deleted

## 2021-10-07 NOTE — Progress Notes (Signed)
Oncology Nurse Navigator Documentation  Oncology Nurse Navigator Flowsheets 10/07/2021 10/06/2021  Abnormal Finding Date - 07/02/2021  Confirmed Diagnosis Date - 09/14/2021  Diagnosis Status - Pending Molecular Studies  Planned Course of Treatment - Radiation  Phase of Treatment - Radiation  Radiation Actual Start Date: - 10/13/2021  Navigator Follow Up Date: - 10/08/2021  Navigator Follow Up Reason: - Pathology  Navigator Location [facility]  Navigator Encounter Type Other: Clinic/MDC;Initial MedOnc  Treatment Initiated Date - 10/13/2021  Patient Visit Type Other Initial;MedOnc  Barriers/Navigation Needs Coordination of Care/I followed up with pathology to check if tissue could be sent to [place]. Waiting for responds.  Education;Coordination of Care  Education - Newly Diagnosed Cancer Education;Other  Interventions Coordination of Care Coordination of Care;Education  Acuity Level 2-Minimal Needs (1-2 Barriers Identified) Level 3-Moderate Needs (3-4 Barriers Identified)  Coordination of Care Pathology Pathology  Education Method - Verbal;Other  Time Spent with Patient 90 90

## 2021-10-07 NOTE — Progress Notes (Signed)
I heard back from pathology, tissue has been sent for molecular and PDL 1 to [place]

## 2021-10-09 ENCOUNTER — Encounter: Payer: Self-pay | Admitting: *Deleted

## 2021-10-09 ENCOUNTER — Telehealth: Payer: Self-pay | Admitting: *Deleted

## 2021-10-09 ENCOUNTER — Encounter (HOSPITAL_COMMUNITY): Payer: Self-pay

## 2021-10-09 NOTE — Progress Notes (Signed)
Oncology Nurse Navigator Documentation  Oncology Nurse Navigator Flowsheets 10/09/2021 10/07/2021 10/06/2021  Abnormal Finding Date - - 07/02/2021  Confirmed Diagnosis Date - - 09/14/2021  Diagnosis Status Confirmed Diagnosis Complete - Pending Molecular Studies  Planned Course of Treatment - - Radiation  Phase of Treatment - - Radiation  Radiation Actual Start Date: - - 10/13/2021  Navigator Follow Up Date: 10/12/2021 - 10/08/2021  Navigator Follow Up Reason: Patient Call - Pathology  Navigator Location [facility]  Navigator Encounter Type Telephone Other: Clinic/MDC;Initial MedOnc  Telephone Outgoing Call/I followed up on Dorothy Gross schedule. She needed PET and MRI appts. I called radiology and was able to get an appt. I then called patient with an update on appt. I was unable to reach but did leave vm message with my name and phone number to call for an update. I also followed up on molecular status. This and PDL 1 testing are complete. I will place on Dr. Worthy Flank desk.  - -  Treatment Initiated Date - - 10/13/2021  Patient Visit Type Other Other Initial;MedOnc  Treatment Phase Pre-Tx/Tx Discussion - -  Barriers/Navigation Needs Coordination of Care;Education Coordination of Care Education;Coordination of Care  Education Other - Newly Diagnosed Cancer Education;Other  Interventions Coordination of Care;Education Coordination of Care Coordination of Care;Education  Acuity Level 2-Minimal Needs (1-2 Barriers Identified) Level 2-Minimal Needs (1-2 Barriers Identified) Level 3-Moderate Needs (3-4 Barriers Identified)  Coordination of Care Appts Pathology Pathology  Education Method Verbal - Verbal;Other  Time Spent with Patient 20 94 70

## 2021-10-09 NOTE — Progress Notes (Signed)
Oncology Nurse Navigator Documentation  Oncology Nurse Navigator Flowsheets 10/09/2021 10/07/2021 10/06/2021  Abnormal Finding Date - - 07/02/2021  Confirmed Diagnosis Date - - 09/14/2021  Diagnosis Status Confirmed Diagnosis Complete - Pending Molecular Studies  Planned Course of Treatment - - Radiation  Phase of Treatment - - Radiation  Radiation Actual Start Date: - - 10/13/2021  Navigator Follow Up Date: 10/12/2021 - 10/08/2021  Navigator Follow Up Reason: Patient Call - Pathology  Navigator Location [place]  Navigator Encounter Type Telephone Other: Clinic/MDC;Initial MedOnc  Telephone Outgoing Call - -  Treatment Initiated Date - - 10/13/2021  Patient Visit Type Other Other Initial;MedOnc  Treatment Phase Pre-Tx/Tx Discussion - -  Barriers/Navigation Needs Coordination of Care;Education/I followed up on Dorothy Gross's schedule. She needed a PET and MRI brain scan. I called radiology team and was able to get a time and date for her appts. I called Dorothy Gross. She called me right back and I updated her on appts and pre-procedure instructions for PET scan.  She verbalized understanding of appts.  Coordination of Care Education;Coordination of Care  Education Other - Newly Diagnosed Cancer Education;Other  Interventions Coordination of Care;Education Coordination of Care Coordination of Care;Education  Acuity Level 2-Minimal Needs (1-2 Barriers Identified) Level 2-Minimal Needs (1-2 Barriers Identified) Level 3-Moderate Needs (3-4 Barriers Identified)  Coordination of Care Appts Pathology Pathology  Education Method Verbal - Verbal;Other  Time Spent with Patient 35 78 97

## 2021-10-13 ENCOUNTER — Encounter (HOSPITAL_COMMUNITY): Payer: Self-pay

## 2021-10-13 ENCOUNTER — Other Ambulatory Visit: Payer: Self-pay

## 2021-10-13 ENCOUNTER — Ambulatory Visit
Admission: RE | Admit: 2021-10-13 | Discharge: 2021-10-13 | Disposition: A | Discharge status: Home / Self Care | Payer: Medicare HMO | Source: Ambulatory Visit | Attending: Radiation Oncology | Admitting: Radiation Oncology

## 2021-10-13 ENCOUNTER — Other Ambulatory Visit
Admission: RE | Admit: 2021-10-13 | Discharge: 2021-10-13 | Disposition: A | Discharge status: Home / Self Care | Payer: Medicare HMO | Source: Ambulatory Visit | Attending: Student | Admitting: Student

## 2021-10-13 DIAGNOSIS — Z510 Encounter for antineoplastic radiation therapy: Secondary | ICD-10-CM | POA: Diagnosis not present

## 2021-10-13 DIAGNOSIS — Z5181 Encounter for therapeutic drug level monitoring: Secondary | ICD-10-CM | POA: Diagnosis present

## 2021-10-13 DIAGNOSIS — Z79899 Other long term (current) drug therapy: Secondary | ICD-10-CM | POA: Diagnosis present

## 2021-10-13 LAB — DIGOXIN LEVEL: Digoxin Level: 0.4 ng/mL — ABNORMAL LOW (ref 0.8–2.0)

## 2021-10-14 ENCOUNTER — Ambulatory Visit: Payer: Medicare HMO | Admitting: Radiation Oncology

## 2021-10-15 ENCOUNTER — Other Ambulatory Visit: Payer: Self-pay

## 2021-10-15 ENCOUNTER — Ambulatory Visit
Admission: RE | Admit: 2021-10-15 | Discharge: 2021-10-15 | Disposition: A | Discharge status: Home / Self Care | Payer: Medicare HMO | Source: Ambulatory Visit | Attending: Radiation Oncology | Admitting: Radiation Oncology

## 2021-10-15 DIAGNOSIS — Z510 Encounter for antineoplastic radiation therapy: Secondary | ICD-10-CM | POA: Diagnosis not present

## 2021-10-16 ENCOUNTER — Ambulatory Visit: Payer: Medicare HMO | Admitting: Radiation Oncology

## 2021-10-19 ENCOUNTER — Ambulatory Visit
Admission: RE | Admit: 2021-10-19 | Discharge: 2021-10-19 | Disposition: A | Discharge status: Home / Self Care | Payer: Medicare HMO | Source: Ambulatory Visit | Attending: Radiation Oncology | Admitting: Radiation Oncology

## 2021-10-19 ENCOUNTER — Other Ambulatory Visit: Payer: Self-pay

## 2021-10-19 DIAGNOSIS — Z510 Encounter for antineoplastic radiation therapy: Secondary | ICD-10-CM | POA: Diagnosis not present

## 2021-10-21 ENCOUNTER — Ambulatory Visit
Admission: RE | Admit: 2021-10-21 | Discharge: 2021-10-21 | Disposition: A | Discharge status: Home / Self Care | Payer: Medicare HMO | Source: Ambulatory Visit | Attending: Radiation Oncology | Admitting: Radiation Oncology

## 2021-10-21 ENCOUNTER — Other Ambulatory Visit: Payer: Self-pay

## 2021-10-21 DIAGNOSIS — Z510 Encounter for antineoplastic radiation therapy: Secondary | ICD-10-CM | POA: Diagnosis not present

## 2021-10-23 ENCOUNTER — Other Ambulatory Visit: Payer: Self-pay

## 2021-10-23 ENCOUNTER — Encounter: Payer: Self-pay | Admitting: Radiation Oncology

## 2021-10-23 ENCOUNTER — Ambulatory Visit
Admission: RE | Admit: 2021-10-23 | Discharge: 2021-10-23 | Disposition: A | Discharge status: Home / Self Care | Payer: Medicare HMO | Source: Ambulatory Visit | Attending: Radiation Oncology | Admitting: Radiation Oncology

## 2021-10-23 DIAGNOSIS — Z510 Encounter for antineoplastic radiation therapy: Secondary | ICD-10-CM | POA: Diagnosis not present

## 2021-10-26 NOTE — Progress Notes (Signed)
° °                                                                                                                                                          °  Patient Name: Dorothy Gross MRN: [id number] DOB: 02/02/1951 Referring Physician: June Leap Date of Service: 10/23/2021 [facility] [place]                                                        End Of Treatment Note  Diagnoses: C34.11-Malignant neoplasm of upper lobe, right bronchus or lung  Cancer Staging: Multifocal Stage IA2, cT1bN0M0, NSCLC, adenocarcinoma of the RUL nodules  Intent: Curative  Radiation Treatment Dates: 10/13/2021 through 10/23/2021 Site Technique Total Dose (Gy) Dose per Fx (Gy) Completed Fx Beam Energies  Lung, Right: Lung_R_SBRT IMRT 60/60 12 5/5 [id number]   Narrative: The patient tolerated radiation therapy relatively well.    Plan: The patient will receive a call in about one month from the radiation oncology department. She will continue follow up with Dr. Julien Nordmann as well. Discussions in thoracic oncology conference have also been to possibly consider systemic therapy depending on her response to SBRT.  ________________________________________________    Carola Rhine, PAC

## 2021-11-02 ENCOUNTER — Ambulatory Visit (HOSPITAL_COMMUNITY)
Admission: RE | Admit: 2021-11-02 | Discharge: 2021-11-02 | Disposition: A | Discharge status: Home / Self Care | Payer: Medicare HMO | Source: Ambulatory Visit | Attending: Internal Medicine | Admitting: Internal Medicine

## 2021-11-02 ENCOUNTER — Inpatient Hospital Stay: Payer: Medicare HMO | Attending: Internal Medicine

## 2021-11-02 ENCOUNTER — Other Ambulatory Visit: Payer: Self-pay

## 2021-11-02 DIAGNOSIS — C3411 Malignant neoplasm of upper lobe, right bronchus or lung: Secondary | ICD-10-CM | POA: Diagnosis not present

## 2021-11-02 DIAGNOSIS — Z90710 Acquired absence of both cervix and uterus: Secondary | ICD-10-CM | POA: Diagnosis not present

## 2021-11-02 DIAGNOSIS — C3490 Malignant neoplasm of unspecified part of unspecified bronchus or lung: Secondary | ICD-10-CM | POA: Insufficient documentation

## 2021-11-02 DIAGNOSIS — N200 Calculus of kidney: Secondary | ICD-10-CM | POA: Insufficient documentation

## 2021-11-02 DIAGNOSIS — E119 Type 2 diabetes mellitus without complications: Secondary | ICD-10-CM | POA: Diagnosis not present

## 2021-11-02 DIAGNOSIS — I2510 Atherosclerotic heart disease of native coronary artery without angina pectoris: Secondary | ICD-10-CM | POA: Insufficient documentation

## 2021-11-02 DIAGNOSIS — I700 Atherosclerosis of aorta: Secondary | ICD-10-CM | POA: Diagnosis not present

## 2021-11-02 DIAGNOSIS — E279 Disorder of adrenal gland, unspecified: Secondary | ICD-10-CM | POA: Insufficient documentation

## 2021-11-02 DIAGNOSIS — I517 Cardiomegaly: Secondary | ICD-10-CM | POA: Diagnosis not present

## 2021-11-02 DIAGNOSIS — E042 Nontoxic multinodular goiter: Secondary | ICD-10-CM | POA: Insufficient documentation

## 2021-11-02 DIAGNOSIS — I110 Hypertensive heart disease with heart failure: Secondary | ICD-10-CM | POA: Insufficient documentation

## 2021-11-02 LAB — CBC WITH DIFFERENTIAL (CANCER CENTER ONLY)
Abs Immature Granulocytes: 0.02 10*3/uL (ref 0.00–0.07)
Basophils Absolute: 0.1 10*3/uL (ref 0.0–0.1)
Basophils Relative: 1 %
Eosinophils Absolute: 0.1 10*3/uL (ref 0.0–0.5)
Eosinophils Relative: 2 %
HCT: 41.3 % (ref 36.0–46.0)
Hemoglobin: 13.5 g/dL (ref 12.0–15.0)
Immature Granulocytes: 0 %
Lymphocytes Relative: 16 %
Lymphs Abs: 1.1 10*3/uL (ref 0.7–4.0)
MCH: 28 pg (ref 26.0–34.0)
MCHC: 32.7 g/dL (ref 30.0–36.0)
MCV: 85.5 fL (ref 80.0–100.0)
Monocytes Absolute: 0.6 10*3/uL (ref 0.1–1.0)
Monocytes Relative: 8 %
Neutro Abs: 5.1 10*3/uL (ref 1.7–7.7)
Neutrophils Relative %: 73 %
Platelet Count: 216 10*3/uL (ref 150–400)
RBC: 4.83 MIL/uL (ref 3.87–5.11)
RDW: 14.8 % (ref 11.5–15.5)
WBC Count: 7 10*3/uL (ref 4.0–10.5)
nRBC: 0 % (ref 0.0–0.2)

## 2021-11-02 LAB — CMP (CANCER CENTER ONLY)
ALT: 36 U/L (ref 0–44)
AST: 26 U/L (ref 15–41)
Albumin: 4.3 g/dL (ref 3.5–5.0)
Alkaline Phosphatase: 83 U/L (ref 38–126)
Anion gap: 8 (ref 5–15)
BUN: 15 mg/dL (ref 8–23)
CO2: 30 mmol/L (ref 22–32)
Calcium: 9.6 mg/dL (ref 8.9–10.3)
Chloride: 104 mmol/L (ref 98–111)
Creatinine: 0.84 mg/dL (ref 0.44–1.00)
GFR, Estimated: >60 mL/min (ref >60)
Glucose, Bld: 114 mg/dL — ABNORMAL HIGH (ref 70–99)
Potassium: 4.2 mmol/L (ref 3.5–5.1)
Sodium: 142 mmol/L (ref 135–145)
Total Bilirubin: 0.6 mg/dL (ref 0.3–1.2)
Total Protein: 7.5 g/dL (ref 6.5–8.1)

## 2021-11-02 LAB — GLUCOSE, CAPILLARY: Glucose-Capillary: 116 mg/dL — ABNORMAL HIGH (ref 70–99)

## 2021-11-02 IMAGING — CT NM PET TUM IMG INITIAL (PI) SKULL BASE T - THIGH
1 of 7 series · 1 of 25 positions shown · non-contrast
Comparison: Chest CT [DATE]

CLINICAL DATA: Subsequent treatment strategy for non-small cell
lung cancer.

EXAM:
NUCLEAR MEDICINE PET SKULL BASE TO THIGH
TECHNIQUE: 9.7 mCi F-18 FDG was injected intravenously. Full-ring PET imaging
was performed from the skull base to thigh after the radiotracer. CT
data was obtained and used for attenuation correction and anatomic
localization.
Fasting blood glucose: 116 mg/dl

[Series 4: ct sk_thigh 5.0 bf37 · axial · 5.0mm · 0.98mm/px · 1 of 211 slices shown]
[im 211/211  brain]
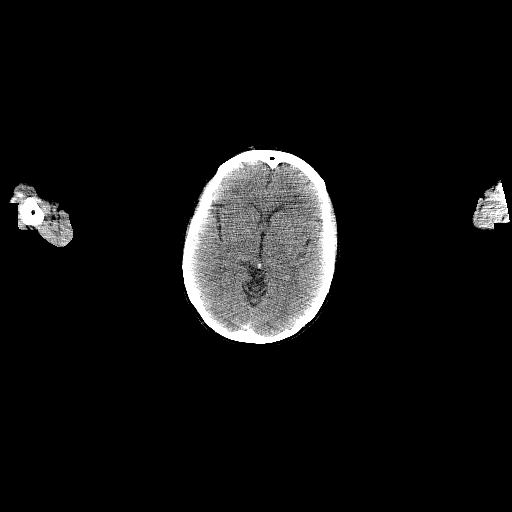

[1 of 25 positions shown; findings below may reference images not displayed]

FINDINGS: Mediastinal blood pool activity: SUV max

Liver activity: SUV max N/A

NECK: Left thyroid nodules including a 1.6 cm hypodense nodule and a
1.5 cm hypodense nodule, with metabolic activity similar to the rest
of the thyroid gland.

Incidental CT findings: Bilateral common carotid atherosclerotic
calcification.

CHEST: Fiducial near the dominant right lower lobe nodule sitting
just anterior to the major fissure. This sub solid nodule measures
1.3 by 1.0 cm on image 64 series 4 has a maximum SUV of 2.1.

The subpleural 1.5 by 0.9 cm nodule at the right lung apex has a
maximum SUV of 1.7. The remaining sub solid nodules are too small to
characterize.

Low-grade activity along the hazy bilateral nonfocal subpleural
reticulation primarily in the lower lobes.

Incidental CT findings: Coronary, aortic arch, and branch vessel
atherosclerotic vascular disease. Mild cardiomegaly.

ABDOMEN/PELVIS: Lobulated left adrenal mass 2.4 by 1.5 cm on image
103 of series 4, previously 2.6 by 1.6 cm back on [DATE], with
only low-grade metabolic activity, maximum SUV 3.5 (the
contralateral morphologically normal appearing right adrenal gland
has a maximum SUV of 3.7). This is probably an adenoma based on
prior absolute washout of 67%. Current noncontrast density
measurements are nonspecific.

Widespread accentuated bowel activity is likely physiologic.

Incidental CT findings: Cholecystectomy. Nonobstructive
nephrolithiasis on the right. Residual contrast in the renal
collecting systems and urinary bladder. Atherosclerosis is present,
including aortoiliac atherosclerotic disease.

SKELETON: No significant abnormal hypermetabolic activity in this
region.

Incidental CT findings: Mild levoconvex lumbar scoliosis.
IMPRESSION: 1. The dominant subsolid right lower lobe nodule just anterior to
the major fissure has a maximum SUV of 2.1, compatible with
low-grade adenocarcinoma based on positive biopsy result. The
subpleural 1.5 by 0.9 cm nodule at the right lung apex has a maximum
SUV of 1.7. The other sub solid pulmonary nodules are too small to
characterize.
2. Low-grade activity along hazy bilateral nonfocal subpleural
reticulations in the lower lobes, likely inflammatory.
3. Lobulated left adrenal mass is not hypermetabolic compared to the
contralateral morphologically normal right adrenal gland, and is
probably an adenoma based on current and prior imaging.
4. Left thyroid nodules including a 1.6 cm nodule. These were worked
up on prior thyroid ultrasound of [DATE] and biopsy of
[DATE], which was benign. This has been evaluated on previous
imaging. (ref: [HOSPITAL]. [DATE]): 143-50).
5. Other imaging findings of potential clinical significance: Aortic
Atherosclerosis ([00]-[00]). Coronary atherosclerosis. Mild
cardiomegaly. Nonobstructive nephrolithiasis. Levoconvex lumbar
scoliosis.

## 2021-11-02 IMAGING — MR MR HEAD WO/W CM
13 series · 48 of 48 positions shown · IV contrast (gadavist)
Comparison: No pertinent prior exams available for comparison.

CLINICAL DATA: Malignant neoplasm of unspecified part of
unspecified bronchus or lung. Non-small cell lung cancer, staging.

EXAM:
MRI HEAD WITHOUT AND WITH CONTRAST
TECHNIQUE: Multiplanar, multiecho pulse sequences of the brain and surrounding
structures were obtained without and with intravenous contrast.
CONTRAST:  8mL GADAVIST GADOBUTROL 1 MMOL/ML IV SOLN

[Series 5: T1 · sagittal · 5.0mm · 0.75mm/px · 2 of 24 slices shown (1 of 2)]
[im 1/24]
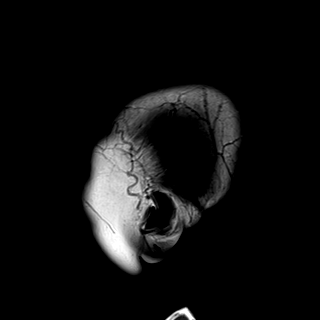
[im 24/24]
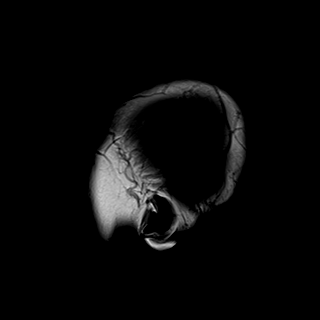

[Series 6: DWI · axial · 3.0mm · 1.36mm/px · z∈[-54,+97]mm · 6 of 104 slices shown (1 of 2)]
[im 1/104]
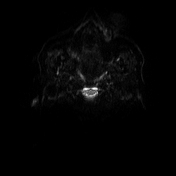
[im 21/104]
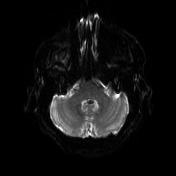
[im 42/104]
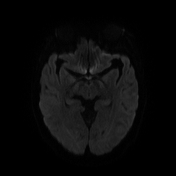
[im 62/104]
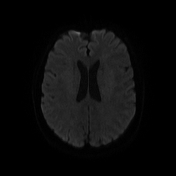
[im 83/104]
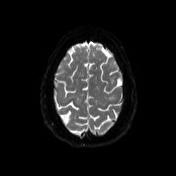
[im 104/104]
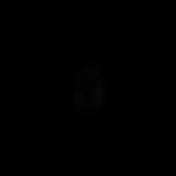

[Series 7: DWI · axial · 3.0mm · 1.36mm/px · z∈[-54,+97]mm · 3 of 52 slices shown (2 of 2)]
[im 1/52]
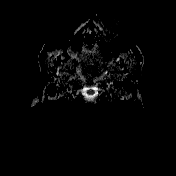
[im 26/52]
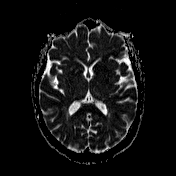
[im 52/52]
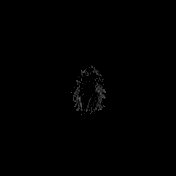

[Series 8: cor dwi_tracew · coronal · 5.0mm · 1.53mm/px · 4 of 62 slices shown]
[im 1/62]
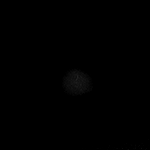
[im 21/62]
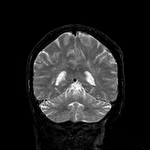
[im 41/62]
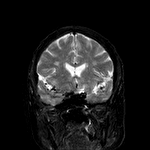
[im 62/62]
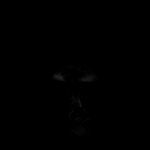

[Series 9: cor dwi_adc · coronal · 5.0mm · 1.53mm/px · 2 of 31 slices shown]
[im 1/31]
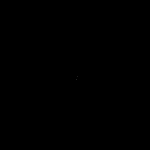
[im 31/31]
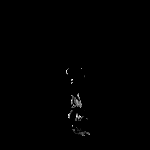

[Series 10: T2 · axial · 5.0mm · 0.62mm/px · z∈[-61,+99]mm · 2 of 26 slices shown]
[im 1/26]
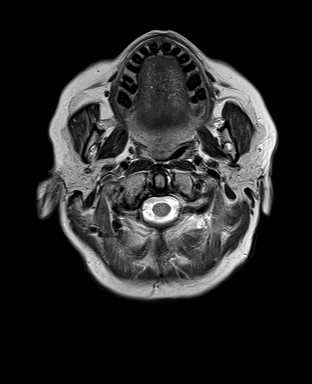
[im 26/26]
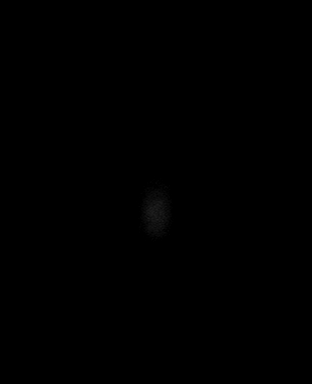

[Series 11: swi_images · axial · 3.0mm · 0.75mm/px · z∈[-63,+100]mm · 3 of 56 slices shown]
[im 1/56]
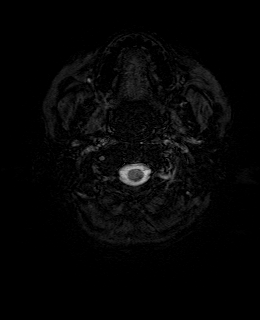
[im 28/56]
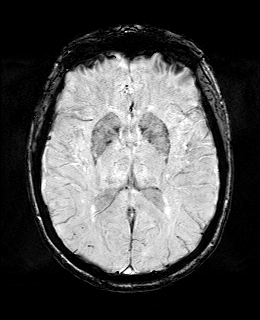
[im 56/56]
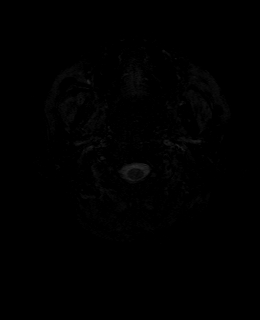

[Series 13: FLAIR · axial · 3.0mm · 0.75mm/px · z∈[-63,+100]mm · 3 of 56 slices shown]
[im 1/56]
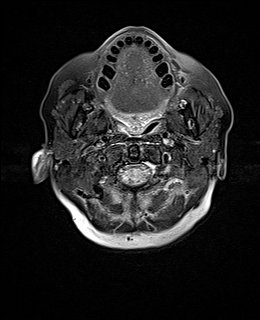
[im 28/56]
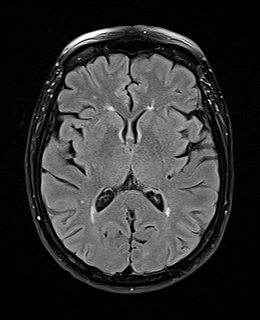
[im 56/56]
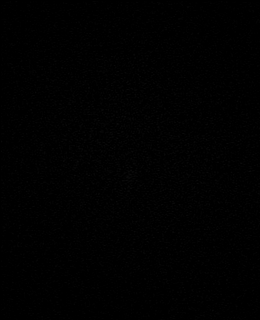

[Series 14: T1 · axial · 1.0mm · 0.94mm/px · z∈[-60,+97]mm · 9 of 160 slices shown (2 of 2)]
[im 1/160]
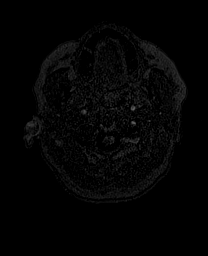
[im 20/160]
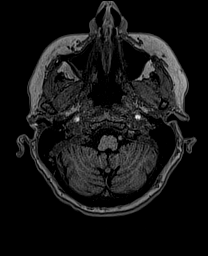
[im 40/160]
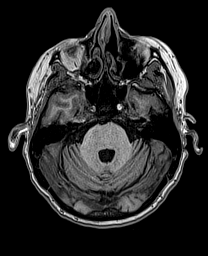
[im 60/160]
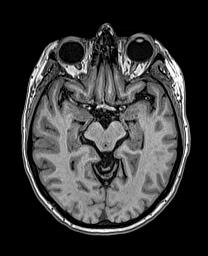
[im 80/160]
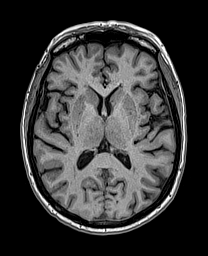
[im 100/160]
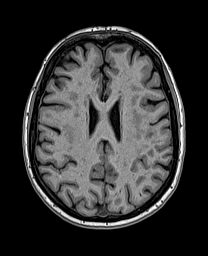
[im 120/160]
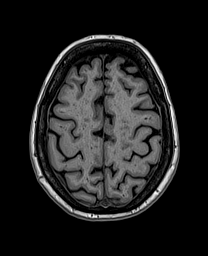
[im 140/160]
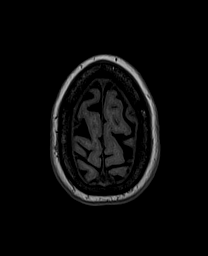
[im 160/160]
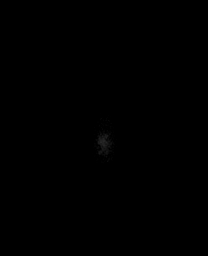

[Series 15: T2 post-contrast · coronal · 5.0mm · 0.57mm/px · 2 of 32 slices shown]
[im 1/32]
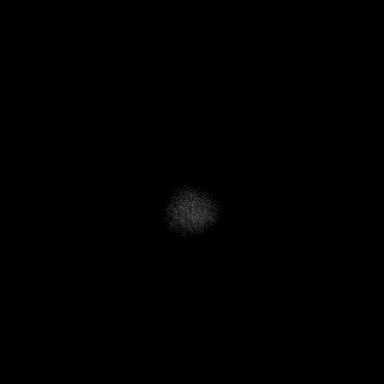
[im 32/32]
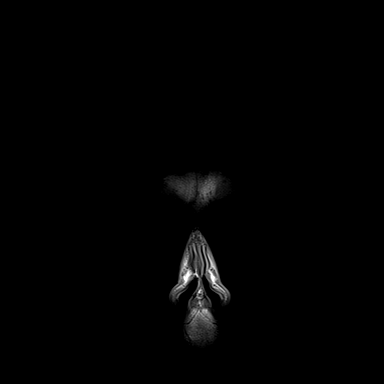

[Series 16: T1 post-contrast · axial · 1.0mm · 0.94mm/px · z∈[-60,+97]mm · 9 of 160 slices shown (1 of 3)]
[im 1/160]
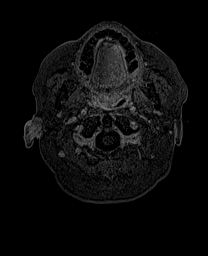
[im 20/160]
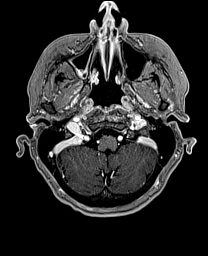
[im 40/160]
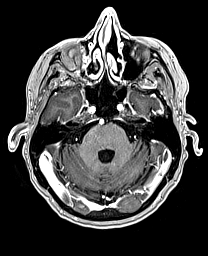
[im 60/160]
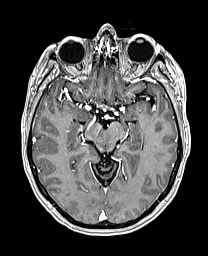
[im 80/160]
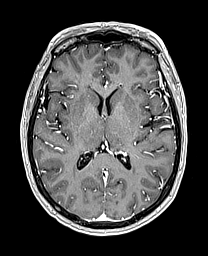
[im 100/160]
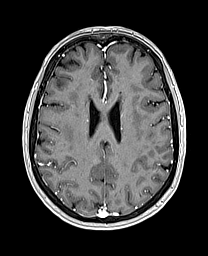
[im 120/160]
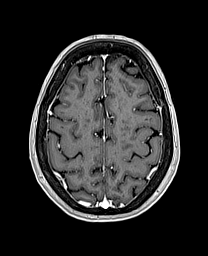
[im 140/160]
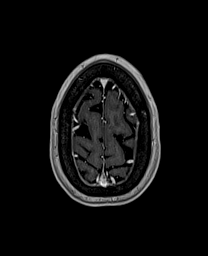
[im 160/160]
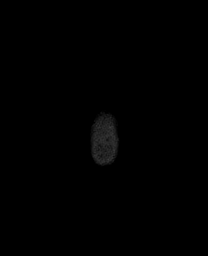

[Series 17: T1 post-contrast · coronal · 5.0mm · 0.43mm/px · 2 of 32 slices shown (2 of 3)]
[im 1/32]
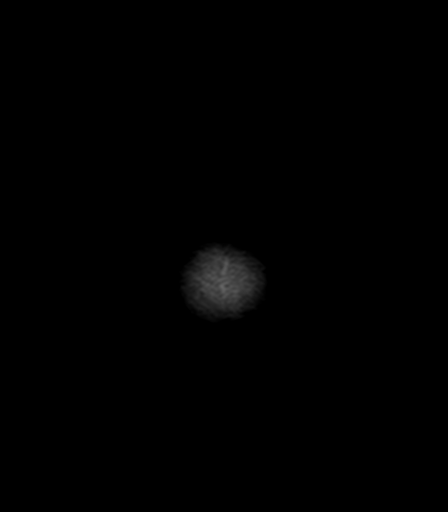
[im 32/32]
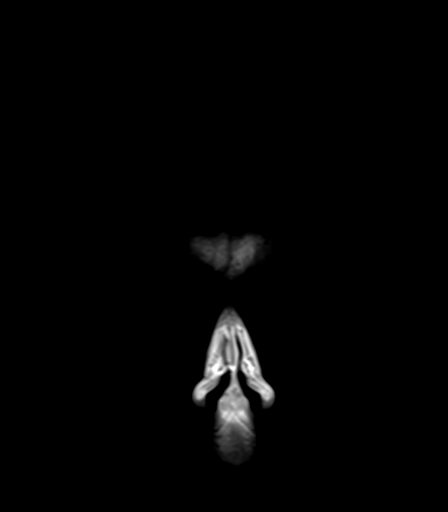

[Series 18: T1 post-contrast · sagittal · 5.0mm · 0.75mm/px · 1 of 24 slices shown (3 of 3)]
[im 1/24]
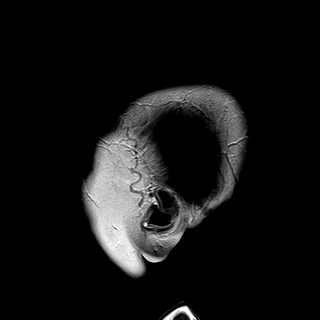

[48 of 48 positions shown; findings below may reference images not displayed]

FINDINGS: Brain:

Cerebral volume is normal. Mild cerebellar atrophy.

Moderate multifocal T2 FLAIR hyperintense signal abnormality within
the cerebral white matter, nonspecific but compatible chronic small
vessel ischemic disease.

There is no acute infarct.

No evidence of an intracranial mass.

No chronic intracranial blood products.

No extra-axial fluid collection.

No midline shift.

No pathologic intracranial enhancement identified.

Vascular: Maintained flow voids within the proximal large arterial
vessels.

Skull and upper cervical spine: No focal suspicious marrow lesion.
Incompletely assessed cervical spondylosis. Apparent subcentimeter
focus of enhancement within the spinal cord at the C4 level (series
18, image 13).

Sinuses/Orbits: Visualized orbits show no acute finding. Minimal
mucosal thickening within the bilateral ethmoid sinuses. Mild
mucosal thickening within the bilateral maxillary sinuses.
Superimposed small mucous retention cyst within the left maxillary
sinus.

Other: Trace fluid within the right mastoid air cells.

Impression #2 will be called to the ordering clinician or
representative by the Radiologist Assistant, and communication
documented in the PACS or [REDACTED].
IMPRESSION: No evidence of intracranial metastatic disease.

Apparent subcentimeter focus of enhancement within the spinal cord
at the C4 level. This may reflect artifact. However, an MRI of the
cervical spine with contrast is recommended to exclude a spinal cord
metastasis at this site.

Moderate chronic small vessel image changes within the cerebral
white matter.

Mild generalized cerebellar atrophy.

Paranasal sinus disease, as described.

## 2021-11-02 MED ORDER — FLUDEOXYGLUCOSE F - 18 (FDG) INJECTION
9.5000 | Freq: Once | INTRAVENOUS | Status: AC | PRN
  Administered 2021-11-02: 9.74 via INTRAVENOUS

## 2021-11-02 MED ORDER — GADOBUTROL 1 MMOL/ML IV SOLN
8.0000 mL | Freq: Once | INTRAVENOUS | Status: AC | PRN
  Administered 2021-11-02: 8 mL via INTRAVENOUS

## 2021-11-04 ENCOUNTER — Other Ambulatory Visit: Payer: Self-pay

## 2021-11-04 ENCOUNTER — Inpatient Hospital Stay: Payer: Medicare HMO | Admitting: Internal Medicine

## 2021-11-04 VITALS — BP 171/62 | HR 70 | Temp 98.0°F | Resp 19 | Ht 64.0 in | Wt 191.5 lb

## 2021-11-04 DIAGNOSIS — C3490 Malignant neoplasm of unspecified part of unspecified bronchus or lung: Secondary | ICD-10-CM

## 2021-11-04 DIAGNOSIS — C3411 Malignant neoplasm of upper lobe, right bronchus or lung: Secondary | ICD-10-CM | POA: Diagnosis not present

## 2021-11-04 NOTE — Progress Notes (Signed)
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 Molecular Study by [employer] [facility]: Positive EGFR V769M, G719S PDL1 Expression: 0%  PRIOR THERAPY: SBRT to the right upper lobe lung nodule under the care of Dr. Lisbeth Renshaw  CURRENT THERAPY: Observation  INTERVAL HISTORY: Dorothy Gross 71 y.o. female returns to the clinic today for follow-up visit accompanied by her daughter Caryl Pina.  The patient is feeling fine today with no concerning complaints except for anxiety about her condition and scans.  Her blood pressure is elevated today but usually normal at home.  She denied having any current chest pain but has shortness of breath with exertion with mild cough and no hemoptysis.  She denied having any fever or chills.  She has no nausea, vomiting, diarrhea or constipation.  She has no headache or visual changes.  She had a PET scan as well as MRI of the brain performed recently.  She also had molecular studies by [facility] and she is here for evaluation and discussion of her results and treatment options.  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  MEDICATIONS:  Current Outpatient Medications  Medication Sig Dispense Refill   acetaminophen (TYLENOL) 500 MG tablet Take 1,000 mg by mouth every 6 (six) hours as needed.      amiodarone (PACERONE) 200 MG tablet Take 200 mg by mouth daily.      apixaban (ELIQUIS) 5 MG TABS tablet Take 5 mg by mouth 2 (two) times daily.      BIOTIN PO Take 1 capsule by mouth daily.     calcium carbonate (OS-CAL - DOSED IN MG OF ELEMENTAL CALCIUM) 1250 (500 Ca) MG tablet Take 1 tablet by mouth daily with breakfast.     carvedilol (COREG) 6.25 MG tablet Take 6.25 mg by mouth 2 (two) times daily with a meal.      Cholecalciferol (VITAMIN D3) 1000 units CAPS Take 1,000 Units by mouth daily.      cyanocobalamin 1000 MCG tablet Take 1,000 mcg by mouth daily.      digoxin (LANOXIN) 0.125 MG tablet Take 0.125 mg by mouth daily.      doxycycline (MONODOX) 100 MG capsule Take 100 mg by mouth 2 (two) times daily.     ferrous sulfate 325 (65 FE) MG tablet Take 325 mg by mouth every other day.     Flaxseed, Linseed, (FLAX SEED OIL PO) Take 2 capsules by mouth daily.     furosemide (LASIX) 40 MG tablet Take 40 mg by mouth daily.      glucose blood (ONETOUCH ULTRA) test strip USE 1 EACH (1 STRIP TOTAL) 3 (THREE) TIMES DAILY     Lancets MISC      losartan (COZAAR) 25 MG tablet Take 50 mg by mouth daily.  metFORMIN (GLUCOPHAGE-XR) 500 MG 24 hr tablet Take 1,000 mg by mouth daily with supper.     Multiple Vitamins-Minerals (MULTIVITAMIN ADULT PO) Take 1 tablet by mouth daily.     Polyethyl Glycol-Propyl Glycol (SYSTANE OP) Place 1 drop into both eyes daily as needed (dry eyes).     rosuvastatin (CRESTOR) 10 MG tablet Take 10 mg by mouth daily.      sodium chloride (OCEAN) 0.65 % SOLN nasal spray Place 1 spray into both nostrils daily as needed (Dry nose).     zinc gluconate 50 MG tablet Take 50 mg by mouth daily.      No current facility-administered medications for this visit.    SURGICAL HISTORY:  Past Surgical History:  Procedure Laterality Date   ABDOMINAL HYSTERECTOMY     APPENDECTOMY     BREAST EXCISIONAL BIOPSY Right [id number]   negative 1974 negative 2015   BRONCHIAL BIOPSY   03/23/2021   Procedure: BRONCHIAL BIOPSIES;  Surgeon: Garner Nash, DO;  Location: [facility];  Service: Pulmonary;;   BRONCHIAL BIOPSY  09/14/2021   Procedure: BRONCHIAL BIOPSIES;  Surgeon: Garner Nash, DO;  Location: [facility];  Service: Pulmonary;;   BRONCHIAL BRUSHINGS  03/23/2021   Procedure: BRONCHIAL BRUSHINGS;  Surgeon: Garner Nash, DO;  Location: [place];  Service: Pulmonary;;   BRONCHIAL BRUSHINGS  09/14/2021   Procedure: BRONCHIAL BRUSHINGS;  Surgeon: Garner Nash, DO;  Location: [place];  Service: Pulmonary;;   BRONCHIAL NEEDLE ASPIRATION BIOPSY  09/14/2021   Procedure: BRONCHIAL NEEDLE ASPIRATION BIOPSIES;  Surgeon: Garner Nash, DO;  Location: [place];  Service: Pulmonary;;   BRONCHIAL WASHINGS  03/23/2021   Procedure: BRONCHIAL WASHINGS;  Surgeon: Garner Nash, DO;  Location: [place];  Service: Pulmonary;;   CHOLECYSTECTOMY N/A 12/05/2015   Procedure: LAPAROSCOPIC CHOLECYSTECTOMY WITH INTRAOPERATIVE CHOLANGIOGRAM;  Surgeon: Leonie Green, MD;  Location: [facility];  Service: General;  Laterality: N/A;   COLONOSCOPY WITH PROPOFOL N/A 10/18/2016   Procedure: COLONOSCOPY WITH PROPOFOL;  Surgeon: Manya Silvas, MD;  Location[facility];  Service: Endoscopy;  Laterality: N/A;   COLONOSCOPY WITH PROPOFOL N/A 03/17/2018   Procedure: COLONOSCOPY WITH PROPOFOL;  Surgeon: Manya Silvas, MD;  Location: [facility];  Service: Endoscopy;  Laterality: N/A;   CYSTOSCOPY W/ RETROGRADES Bilateral 03/07/2020   Procedure: CYSTOSCOPY WITH RETROGRADE PYELOGRAM;  Surgeon: Billey Co, MD;  Location: [facility];  Service: Urology;  Laterality: Bilateral;   CYSTOSCOPY/URETEROSCOPY/HOLMIUM LASER/STENT PLACEMENT Bilateral 03/07/2020   Procedure: CYSTOSCOPY/URETEROSCOPY/HOLMIUM LASER/STENT PLACEMENT;  Surgeon: Billey Co, MD;  Location: [facility];  Service: Urology;  Laterality: Bilateral;   FIDUCIAL MARKER PLACEMENT  03/23/2021   Procedure:  FIDUCIAL MARKER PLACEMENT;  Surgeon: Garner Nash, DO;  Location: [place] [facility];  Service: Pulmonary;;   JOINT REPLACEMENT     both knees   REPLACEMENT TOTAL KNEE BILATERAL     TONSILLECTOMY     VIDEO BRONCHOSCOPY WITH ENDOBRONCHIAL NAVIGATION Right 03/23/2021   Procedure: VIDEO BRONCHOSCOPY WITH ENDOBRONCHIAL NAVIGATION;  Surgeon: Garner Nash, DO;  Location: [place];  Service: Pulmonary;  Laterality: Right;  ION Case    VIDEO BRONCHOSCOPY WITH RADIAL ENDOBRONCHIAL ULTRASOUND  03/23/2021   Procedure: VIDEO BRONCHOSCOPY WITH RADIAL ENDOBRONCHIAL ULTRASOUND;  Surgeon: Garner Nash, DO;  Location: [facility];  Service: Pulmonary;;   VIDEO BRONCHOSCOPY WITH RADIAL ENDOBRONCHIAL ULTRASOUND  09/14/2021   Procedure: RADIAL ENDOBRONCHIAL ULTRASOUND;  Surgeon: Garner Nash, DO;  Location: [facility];  Service: Pulmonary;;    REVIEW  OF SYSTEMS:  Constitutional: positive for fatigue Eyes: negative Ears, nose, mouth, throat, and face: negative Respiratory: positive for cough and dyspnea on exertion Cardiovascular: negative Gastrointestinal: negative Genitourinary:negative Integument/breast: negative Hematologic/lymphatic: negative Musculoskeletal:negative Neurological: negative Behavioral/Psych: positive for anxiety Endocrine: negative Allergic/Immunologic: negative   PHYSICAL EXAMINATION: General appearance: alert, cooperative, fatigued, and no distress Head: Normocephalic, without obvious abnormality, atraumatic Neck: no adenopathy, no JVD, supple, symmetrical, trachea midline, and thyroid not enlarged, symmetric, no tenderness/mass/nodules Lymph nodes: Cervical, supraclavicular, and axillary nodes normal. Resp: clear to auscultation bilaterally Back: symmetric, no curvature. ROM normal. No CVA tenderness. Cardio: regular rate and rhythm, S1, S2 normal, no murmur, click, rub or gallop GI: soft, non-tender; bowel sounds normal; no masses,  no organomegaly Extremities:  extremities normal, atraumatic, no cyanosis or edema Neurologic: Alert and oriented X 3, normal strength and tone. Normal symmetric reflexes. Normal coordination and gait  ECOG PERFORMANCE STATUS: 1 - Symptomatic but completely ambulatory  Blood pressure (!) 171/62, pulse 70, temperature 98 F (36.7 C), temperature source Tympanic, resp. rate 19, height 5' 4"  (1.626 m), weight 191 lb 8 oz (86.9 kg), SpO2 95 %.  LABORATORY DATA: Lab Results  Component Value Date   WBC 7.0 11/02/2021   HGB 13.5 11/02/2021   HCT 41.3 11/02/2021   MCV 85.5 11/02/2021   PLT 216 11/02/2021      Chemistry      Component Value Date/Time   NA 142 11/02/2021 0828   NA 136 02/12/2013 0606   K 4.2 11/02/2021 0828   K 4.4 02/12/2013 0606   CL 104 11/02/2021 0828   CL 101 02/12/2013 0606   CO2 30 11/02/2021 0828   CO2 30 02/12/2013 0606   BUN 15 11/02/2021 0828   BUN 25 (H) 02/12/2013 0606   CREATININE 0.84 11/02/2021 0828   CREATININE 0.94 02/12/2013 0606      Component Value Date/Time   CALCIUM 9.6 11/02/2021 0828   CALCIUM 8.8 02/12/2013 0606   ALKPHOS 83 11/02/2021 0828   AST 26 11/02/2021 0828   ALT 36 11/02/2021 0828   BILITOT 0.6 11/02/2021 0828       RADIOGRAPHIC STUDIES: MR BRAIN W WO CONTRAST  Result Date: 11/02/2021 CLINICAL DATA:  Malignant neoplasm of unspecified part of unspecified bronchus or lung. Non-small cell lung cancer, staging. EXAM: MRI HEAD WITHOUT AND WITH CONTRAST TECHNIQUE: Multiplanar, multiecho pulse sequences of the brain and surrounding structures were obtained without and with intravenous contrast. CONTRAST:  91m GADAVIST GADOBUTROL 1 MMOL/ML IV SOLN COMPARISON:  No pertinent prior exams available for comparison. FINDINGS: Brain: Cerebral volume is normal. Mild cerebellar atrophy. Moderate multifocal T2 FLAIR hyperintense signal abnormality within the cerebral white matter, nonspecific but compatible chronic small vessel ischemic disease. There is no acute infarct. No  evidence of an intracranial mass. No chronic intracranial blood products. No extra-axial fluid collection. No midline shift. No pathologic intracranial enhancement identified. Vascular: Maintained flow voids within the proximal large arterial vessels. Skull and upper cervical spine: No focal suspicious marrow lesion. Incompletely assessed cervical spondylosis. Apparent subcentimeter focus of enhancement within the spinal cord at the C4 level (series 18, image 13). Sinuses/Orbits: Visualized orbits show no acute finding. Minimal mucosal thickening within the bilateral ethmoid sinuses. Mild mucosal thickening within the bilateral maxillary sinuses. Superimposed small mucous retention cyst within the left maxillary sinus. Other: Trace fluid within the right mastoid air cells. Impression #2 will be called to the ordering clinician or representative by the Radiologist Assistant, and communication documented in the PACS or  [facility] Dashboard. IMPRESSION: No evidence of intracranial metastatic disease. Apparent subcentimeter focus of enhancement within the spinal cord at the C4 level. This may reflect artifact. However, an MRI of the cervical spine with contrast is recommended to exclude a spinal cord metastasis at this site. Moderate chronic small vessel image changes within the cerebral white matter. Mild generalized cerebellar atrophy. Paranasal sinus disease, as described. Electronically Signed   By: Kellie Simmering D.O.   On: 11/02/2021 09:32   NM PET Image Initial (PI) Skull Base To Thigh (F-18 FDG)  Result Date: 11/02/2021 CLINICAL DATA:  Subsequent treatment strategy for non-small cell lung cancer. EXAM: NUCLEAR MEDICINE PET SKULL BASE TO THIGH TECHNIQUE: 9.7 mCi F-18 FDG was injected intravenously. Full-ring PET imaging was performed from the skull base to thigh after the radiotracer. CT data was obtained and used for attenuation correction and anatomic localization. Fasting blood glucose: 116 mg/dl COMPARISON:   Chest CT 07/02/2021 FINDINGS: Mediastinal blood pool activity: SUV max 2.3 Liver activity: SUV max N/A NECK: Left thyroid nodules including a 1.6 cm hypodense nodule and a 1.5 cm hypodense nodule, with metabolic activity similar to the rest of the thyroid gland. Incidental CT findings: Bilateral common carotid atherosclerotic calcification. CHEST: Fiducial near the dominant right lower lobe nodule sitting just anterior to the major fissure. This sub solid nodule measures 1.3 by 1.0 cm on image 64 series 4 has a maximum SUV of 2.1. The subpleural 1.5 by 0.9 cm nodule at the right lung apex has a maximum SUV of 1.7. The remaining sub solid nodules are too small to characterize. Low-grade activity along the hazy bilateral nonfocal subpleural reticulation primarily in the lower lobes. Incidental CT findings: Coronary, aortic arch, and branch vessel atherosclerotic vascular disease. Mild cardiomegaly. ABDOMEN/PELVIS: Lobulated left adrenal mass 2.4 by 1.5 cm on image 103 of series 4, previously 2.6 by 1.6 cm back on 01/09/2020, with only low-grade metabolic activity, maximum SUV 3.5 (the contralateral morphologically normal appearing right adrenal gland has a maximum SUV of 3.7). This is probably an adenoma based on prior absolute washout of 67%. Current noncontrast density measurements are nonspecific. Widespread accentuated bowel activity is likely physiologic. Incidental CT findings: Cholecystectomy. Nonobstructive nephrolithiasis on the right. Residual contrast in the renal collecting systems and urinary bladder. Atherosclerosis is present, including aortoiliac atherosclerotic disease. SKELETON: No significant abnormal hypermetabolic activity in this region. Incidental CT findings: Mild levoconvex lumbar scoliosis. IMPRESSION: 1. The dominant subsolid right lower lobe nodule just anterior to the major fissure has a maximum SUV of 2.1, compatible with low-grade adenocarcinoma based on positive biopsy result. The  subpleural 1.5 by 0.9 cm nodule at the right lung apex has a maximum SUV of 1.7. The other sub solid pulmonary nodules are too small to characterize. 2. Low-grade activity along hazy bilateral nonfocal subpleural reticulations in the lower lobes, likely inflammatory. 3. Lobulated left adrenal mass is not hypermetabolic compared to the contralateral morphologically normal right adrenal gland, and is probably an adenoma based on current and prior imaging. 4. Left thyroid nodules including a 1.6 cm nodule. These were worked up on prior thyroid ultrasound of 06/25/2020 and biopsy of 07/18/2020, which was benign. This has been evaluated on previous imaging. (ref: J Am Coll Radiol. 2015 Feb;12(2): 143-50). 5. Other imaging findings of potential clinical significance: Aortic Atherosclerosis (ICD10-I70.0). Coronary atherosclerosis. Mild cardiomegaly. Nonobstructive nephrolithiasis. Levoconvex lumbar scoliosis. Electronically Signed   By: Van Clines M.D.   On: 11/02/2021 15:16    ASSESSMENT AND PLAN: This is a  very pleasant 71 years old white female with multifocal adenocarcinoma involving the right and left lung mainly presented with  biopsy confirmed right upper lobe lesion diagnosed in January 2023. Molecular Study by [facility] showed Positive EGFR V769M, G719S PDL1 Expression: 0% The patient is status post SBRT to right upper lobe lung nodule under the care of Dr. Lisbeth Renshaw. I had a lengthy discussion with the patient and her daughter today about her current condition and treatment options. I personally and independently reviewed the PET scan and MRI images.  Her PET scan showed no hypermetabolic activity in the dominant lesion in the right lower lobe lung nodule but the other pulmonary nodules were too small to be characterized on the PET scan. MRI of the brain showed no metastatic disease to the brain but there was an area of subcentimeter focus of enhancement within the spinal cord at the C4  level.  This could be artifact but MRI of the cervical spine was recommended to rule out metastatic disease. I will arrange for the patient to have MRI of the cervical spine performed in the next 1-2 weeks. I discussed with the patient her treatment options and in the presence of a GFR mutation, she would be a candidate for treatment with targeted therapy with Tagrisso if she has evidence for disease progression or worsening disease.  She was given the option of starting treatment now versus continuous observation and monitoring and treatment if there is any disease progression.  The patient and her daughter would like to continue on observation for now. I will see her back for follow-up visit in 3 months for evaluation and repeat CT scan of the chest for restaging of her disease. I will call the patient if there is any concerning finding on the MRI of the cervical spine. She was also advised to call immediately if she has any other concerning symptoms in the interval. The patient voices understanding of current disease status and treatment options and is in agreement with the current care plan.  All questions were answered. The patient knows to call the clinic with any problems, questions or concerns. We can certainly see the patient much sooner if necessary. The total time spent in the appointment was 40 minutes.  Disclaimer: This note was dictated with voice recognition software. Similar sounding words can inadvertently be transcribed and may not be corrected upon review.

## 2021-11-12 ENCOUNTER — Encounter: Payer: Self-pay | Admitting: *Deleted

## 2021-11-12 NOTE — Progress Notes (Signed)
Oncology Nurse Navigator Documentation ? ?Oncology Nurse Navigator Flowsheets 11/12/2021 10/09/2021 10/07/2021 10/06/2021  ?Abnormal Finding Date - - - 07/02/2021  ?Confirmed Diagnosis Date - - - 09/14/2021  ?Diagnosis Status - Confirmed Diagnosis Complete - Pending Molecular Studies  ?Planned Course of Treatment - - - Radiation  ?Phase of Treatment - - - Radiation  ?Radiation Actual Start Date: - - - 10/13/2021  ?Navigator Follow Up Date: 02/02/2022 10/12/2021 - 10/08/2021  ?Navigator Follow Up Reason: Follow-up Appointment Patient Call - Pathology  ?Navigator Location [facility]  ?Navigator Encounter Type Appt/Treatment Plan Review Telephone Other: Clinic/MDC;Initial MedOnc  ?Telephone - Outgoing Call - -  ?Treatment Initiated Date - - - 10/13/2021  ?Patient Visit Type - Other Other Initial;MedOnc  ?Treatment Phase Post-Tx Follow-up Pre-Tx/Tx Discussion - -  ?Barriers/Navigation Needs Coordination of Care/I followed up on Ms. Woodham's schedule. She is set up for a follow up in June with Dr. Julien Nordmann post radiation tx. Coordination of Care;Education Coordination of Care Education;Coordination of Care  ?Education - Other - Newly Diagnosed Cancer Education;Other  ?Interventions Coordination of Care Coordination of Care;Education Coordination of Care Coordination of Care;Education  ?Acuity Level 2-Minimal Needs (1-2 Barriers Identified) Level 2-Minimal Needs (1-2 Barriers Identified) Level 2-Minimal Needs (1-2 Barriers Identified) Level 3-Moderate Needs (3-4 Barriers Identified)  ?Coordination of Care Other Appts Pathology Pathology  ?Education Method - Verbal - Verbal;Other  ?Time Spent with Patient 30 30 15  45  ?  ?

## 2021-11-13 ENCOUNTER — Ambulatory Visit: Admit: 2021-11-13 | Payer: Medicare HMO

## 2021-11-13 SURGERY — COLONOSCOPY WITH PROPOFOL
Anesthesia: General

## 2021-11-26 ENCOUNTER — Ambulatory Visit: Payer: Medicare HMO | Admitting: Internal Medicine

## 2021-11-26 ENCOUNTER — Encounter: Payer: Self-pay | Admitting: Internal Medicine

## 2021-11-26 VITALS — BP 132/70 | HR 80 | Ht 64.0 in | Wt 193.0 lb

## 2021-11-26 DIAGNOSIS — E042 Nontoxic multinodular goiter: Secondary | ICD-10-CM | POA: Diagnosis not present

## 2021-11-26 DIAGNOSIS — D3500 Benign neoplasm of unspecified adrenal gland: Secondary | ICD-10-CM | POA: Diagnosis not present

## 2021-11-26 DIAGNOSIS — R7989 Other specified abnormal findings of blood chemistry: Secondary | ICD-10-CM | POA: Diagnosis not present

## 2021-11-26 LAB — BASIC METABOLIC PANEL
BUN: 16 mg/dL (ref 6–23)
CO2: 28 mEq/L (ref 19–32)
Calcium: 9.4 mg/dL (ref 8.4–10.5)
Chloride: 101 mEq/L (ref 96–112)
Creatinine, Ser: 0.78 mg/dL (ref 0.40–1.20)
GFR: 78.05 mL/min (ref >60.00)
Glucose, Bld: 141 mg/dL — ABNORMAL HIGH (ref 70–99)
Potassium: 4.1 mEq/L (ref 3.5–5.1)
Sodium: 142 mEq/L (ref 135–145)

## 2021-11-26 LAB — T4, FREE: Free T4: 1.29 ng/dL (ref 0.60–1.60)

## 2021-11-26 LAB — TSH: TSH: 7.34 u[IU]/mL — ABNORMAL HIGH (ref 0.35–5.50)

## 2021-11-26 NOTE — Progress Notes (Signed)
? ?Name: Dorothy Gross  ?MRN/ DOB: [id number], 04/29/1951    ?Age/ Sex: 71 y.o., female   ? ? ?PCP: [place] Pink, MD   ?Reason for Endocrinology Evaluation: Adrenal adenoma   ?   ?Initial Endocrinology Clinic Visit: 05/26/2020  ? ? ?PATIENT IDENTIFIER: [name] is a 71 y.o., female with a past medical history of A. Fib, CHF, HTN and nephrolithiasis. She has followed with [facility] since 05/26/2020  for consultative assistance with management of her adrenal adenoma ? ?HISTORICAL SUMMARY:  ? ?Pt had an incidental finding of bilateral adrenal nodularity bilaterally L>R, with a HFU of 40 and 95 post-contrast on CT scan in 12/2019 during painless hematuria workup.  ? ?Her Aldo , renin , urinary free cortisol were all normal. Metanephrines were slightly elevated at less then 2x the upper limit of normal, considered not clinically significant.  ?  ?NO FH of adrenal gland issues, has strong FH of HTN ?NO FH of renal stones  ? ? ? ?THYROID HISTORY: ?She was noted with left thyroid nodule on exam in 04/2020, this was confirmed by a thyroid ultrasound revealing a left inferior 2.8 cm meeting FNA criteria. ? ?She is S/P FNA of the left thyroid nodule on 06/2020 with benign cytology  ? ?SUBJECTIVE:  ? ? ?Today (11/26/2021):  Dorothy Gross is here for adrenal adenoma. She is accompanied by her son Dorothy Gross ? ? ?Patient has been diagnosed with lung adenocarcinoma 08/2021.  She has been following up with oncology, recent brain MRI was negative for any metastases, PET scan was also done which showed stability of adrenal adenoma ? ?She is S/P radiation treatment  ? ?Adrenal incidentaloma: ?Substantial weight gain- no ?Centripetal obesity- no ?Easy bruisbility-on Eliquis  ?Severe hypertension- no ?DM-yes  ?Proximal muscle weakness- no ?Fatigue- yes  ?Sudden/ severe headaches- no ?Anxiety attacks- no ?Sweating- no ?Cardiac arrhythmias- A. Fib  ?Palpitations- no ?Fluid retention- occasionally since knee sx   ?Hypokalemia- no ?  ? ?Denies local neck symptoms , dysphagia  or pain  ?Denies constipation or diarrhea  ? ? ? ? ?HISTORY:  ?Past Medical History:  ?Past Medical History:  ?Diagnosis Date  ? A-fib ([place])   ? Arthritis   ? CAD (coronary artery disease)   ? Cancer [facility])   ? 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 Cardiomyopathy ([place])   ? CHF (congestive heart failure) ([place])   ? Diabetes mellitus without complication ([place])   ? History of kidney stones   ? Hyperlipidemia   ? Hypertension   ? Lung cancer ([place]) 09/14/2021  ? ?Past Surgical History:  ?Past Surgical History:  ?Procedure Laterality Date  ? ABDOMINAL HYSTERECTOMY    ? APPENDECTOMY    ? BREAST EXCISIONAL BIOPSY Right [id number]  ? negative 1974 negative 2015  ? BRONCHIAL BIOPSY  03/23/2021  ? Procedure: BRONCHIAL BIOPSIES;  Surgeon: Garner Nash, DO;  Location: [facility] [facility];  Service: Pulmonary;;  ? BRONCHIAL BIOPSY  09/14/2021  ? Procedure: BRONCHIAL BIOPSIES;  Surgeon: Garner Nash, DO;  Location: [facility];  Service: Pulmonary;;  ? BRONCHIAL BRUSHINGS  03/23/2021  ? Procedure: BRONCHIAL BRUSHINGS;  Surgeon: Garner Nash, DO;  Location: [place] [facility];  Service: Pulmonary;;  ? BRONCHIAL BRUSHINGS  09/14/2021  ? Procedure: BRONCHIAL BRUSHINGS;  Surgeon: Garner Nash, DO;  Location: [place];  Service: Pulmonary;;  ? BRONCHIAL NEEDLE ASPIRATION BIOPSY  09/14/2021  ? Procedure: BRONCHIAL NEEDLE ASPIRATION BIOPSIES;  Surgeon: Garner Nash, DO;  Location: [place];  Service: Pulmonary;;  ? BRONCHIAL WASHINGS  03/23/2021  ? Procedure: BRONCHIAL WASHINGS;  Surgeon: Garner Nash, DO;  Location: [place];  Service: Pulmonary;;  ? CHOLECYSTECTOMY N/A 12/05/2015  ? Procedure: LAPAROSCOPIC CHOLECYSTECTOMY WITH INTRAOPERATIVE CHOLANGIOGRAM;  Surgeon: Leonie Green, MD;  Location: [facility];  Service: General;  Laterality: N/A;  ? COLONOSCOPY WITH PROPOFOL N/A 10/18/2016  ? Procedure: COLONOSCOPY WITH PROPOFOL;  Surgeon: Manya Silvas, MD;   Location: [facility];  Service: Endoscopy;  Laterality: N/A;  ? COLONOSCOPY WITH PROPOFOL N/A 03/17/2018  ? Procedure: COLONOSCOPY WITH PROPOFOL;  Surgeon: Manya Silvas, MD;  Location: [facility];  Service: Endoscopy;  Laterality: N/A;  ? CYSTOSCOPY W/ RETROGRADES Bilateral 03/07/2020  ? Procedure: CYSTOSCOPY WITH RETROGRADE PYELOGRAM;  Surgeon: Billey Co, MD;  Location: [facility];  Service: Urology;  Laterality: Bilateral;  ? CYSTOSCOPY/URETEROSCOPY/HOLMIUM LASER/STENT PLACEMENT Bilateral 03/07/2020  ? Procedure: CYSTOSCOPY/URETEROSCOPY/HOLMIUM LASER/STENT PLACEMENT;  Surgeon: Billey Co, MD;  Location: [facility];  Service: Urology;  Laterality: Bilateral;  ? FIDUCIAL MARKER PLACEMENT  03/23/2021  ? Procedure: FIDUCIAL MARKER PLACEMENT;  Surgeon: Garner Nash, DO;  Location: [place] [facility];  Service: Pulmonary;;  ? JOINT REPLACEMENT    ? both knees  ? REPLACEMENT TOTAL KNEE BILATERAL    ? TONSILLECTOMY    ? VIDEO BRONCHOSCOPY WITH ENDOBRONCHIAL NAVIGATION Right 03/23/2021  ? Procedure: VIDEO BRONCHOSCOPY WITH ENDOBRONCHIAL NAVIGATION;  Surgeon: Garner Nash, DO;  Location: [place];  Service: Pulmonary;  Laterality: Right;  ION Case   ? VIDEO BRONCHOSCOPY WITH RADIAL ENDOBRONCHIAL ULTRASOUND  03/23/2021  ? Procedure: VIDEO BRONCHOSCOPY WITH RADIAL ENDOBRONCHIAL ULTRASOUND;  Surgeon: Garner Nash, DO;  Location: [place] [facility];  Service: Pulmonary;;  ? VIDEO BRONCHOSCOPY WITH RADIAL ENDOBRONCHIAL ULTRASOUND  09/14/2021  ? Procedure: RADIAL ENDOBRONCHIAL ULTRASOUND;  Surgeon: Garner Nash, DO;  Location: [place] [facility];  Service: Pulmonary;;  ? ?Social History:  reports that she quit smoking about 14 years ago. Her smoking use included cigarettes. She has a 3.75 pack-year smoking history. She has never used smokeless tobacco. She reports that she does not drink alcohol and does not use drugs. ?Family History:  ?Family History  ?Problem Relation Age of Onset  ? Diabetes Mother   ? Diabetes  Sister   ? Diabetes Maternal Aunt   ? Cancer Maternal Uncle   ? Cancer Maternal Uncle   ? Cancer Maternal Uncle   ? Cancer Maternal Uncle   ? Cancer Maternal Uncle   ? Cancer Maternal Uncle   ? Cancer Maternal Uncle   ? Cancer Maternal Uncle   ? Cancer Maternal Uncle   ? Breast cancer Neg Hx   ? ? ? ?HOME MEDICATIONS: ?Allergies as of 11/26/2021   ?No Known Allergies ?  ? ?  ?Medication List  ?  ? ?  ? Accurate as of November 26, 2021  4:29 PM. If you have any questions, ask your nurse or doctor.  ?  ?  ? ?  ? ?acetaminophen 500 MG tablet ?Commonly known as: TYLENOL ?Take 1,000 mg by mouth every 6 (six) hours as needed. ?  ?amiodarone 200 MG tablet ?Commonly known as: PACERONE ?Take 200 mg by mouth daily. ?  ?apixaban 5 MG Tabs tablet ?Commonly known as: ELIQUIS ?Take 5 mg by mouth 2 (two) times daily. ?  ?BIOTIN PO ?Take 1 capsule by mouth daily. ?  ?calcium carbonate 1250 (500 Ca) MG tablet ?Commonly known as: OS-CAL - dosed in mg of elemental calcium ?Take 1 tablet by  mouth daily with breakfast. ?  ?carvedilol 6.25 MG tablet ?Commonly known as: COREG ?Take 6.25 mg by mouth 2 (two) times daily with a meal. ?  ?cyanocobalamin 1000 MCG tablet ?Take 1,000 mcg by mouth daily. ?  ?digoxin 0.125 MG tablet ?Commonly known as: LANOXIN ?Take 0.125 mg by mouth daily. ?  ?doxycycline 100 MG capsule ?Commonly known as: MONODOX ?Take 100 mg by mouth 2 (two) times daily. ?  ?ferrous sulfate 325 (65 FE) MG tablet ?Take 325 mg by mouth every other day. ?  ?FLAX SEED OIL PO ?Take 2 capsules by mouth daily. ?  ?furosemide 40 MG tablet ?Commonly known as: LASIX ?Take 40 mg by mouth daily. ?  ?Lancets Misc ?  ?losartan 25 MG tablet ?Commonly known as: COZAAR ?Take 50 mg by mouth daily. ?  ?losartan 25 MG tablet ?Commonly known as: COZAAR ?Take 2 tablets by mouth daily. ?  ?metFORMIN 500 MG 24 hr tablet ?Commonly known as: GLUCOPHAGE-XR ?Take 1,000 mg by mouth daily with supper. ?  ?MULTIVITAMIN ADULT PO ?Take 1 tablet by mouth daily. ?   ?OneTouch Ultra test strip ?Generic drug: glucose blood ?USE 1 EACH (1 STRIP TOTAL) 3 (THREE) TIMES DAILY ?  ?rosuvastatin 10 MG tablet ?Commonly known as: CRESTOR ?Take 10 mg by mouth daily. ?  ?sodium c

## 2021-11-26 NOTE — Patient Instructions (Signed)

## 2021-11-30 ENCOUNTER — Ambulatory Visit
Admission: RE | Admit: 2021-11-30 | Discharge: 2021-11-30 | Disposition: A | Discharge status: Home / Self Care | Payer: Medicare HMO | Source: Ambulatory Visit | Attending: Radiation Oncology | Admitting: Radiation Oncology

## 2021-11-30 DIAGNOSIS — C3411 Malignant neoplasm of upper lobe, right bronchus or lung: Secondary | ICD-10-CM

## 2021-12-01 ENCOUNTER — Other Ambulatory Visit: Payer: Medicare HMO

## 2021-12-01 DIAGNOSIS — D3500 Benign neoplasm of unspecified adrenal gland: Secondary | ICD-10-CM

## 2021-12-03 ENCOUNTER — Ambulatory Visit
Admission: RE | Admit: 2021-12-03 | Discharge: 2021-12-03 | Disposition: A | Discharge status: Home / Self Care | Payer: Medicare HMO | Source: Ambulatory Visit | Attending: Internal Medicine | Admitting: Internal Medicine

## 2021-12-03 DIAGNOSIS — E042 Nontoxic multinodular goiter: Secondary | ICD-10-CM

## 2021-12-03 IMAGING — US US THYROID
1 series · 13 of 25 positions shown · non-contrast
Comparison: [DATE]

[DATE]

CLINICAL DATA: Please follow up on MAGDALENA . S/P benign FNA on the left

EXAM:
THYROID ULTRASOUND
TECHNIQUE: Ultrasound examination of the thyroid gland and adjacent soft
tissues was performed.

[Series 1: us thyroid · 0.08mm/px · 44 acquisitions, 13 frames shown]
[im 1/44]
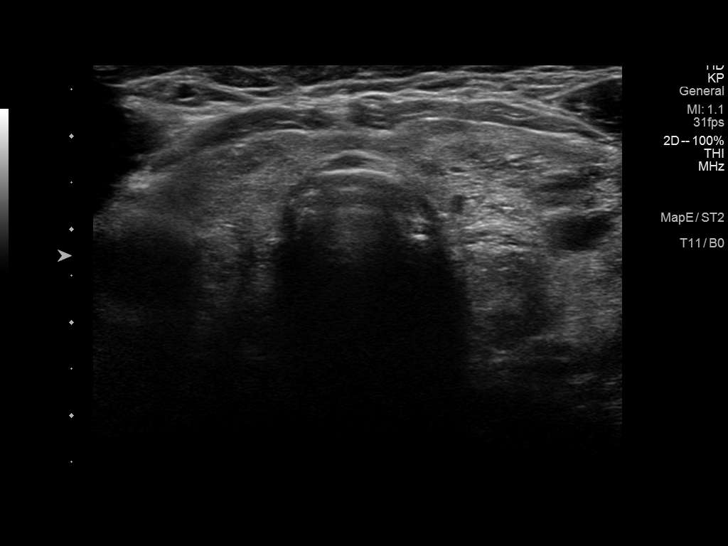
[im 4/44]
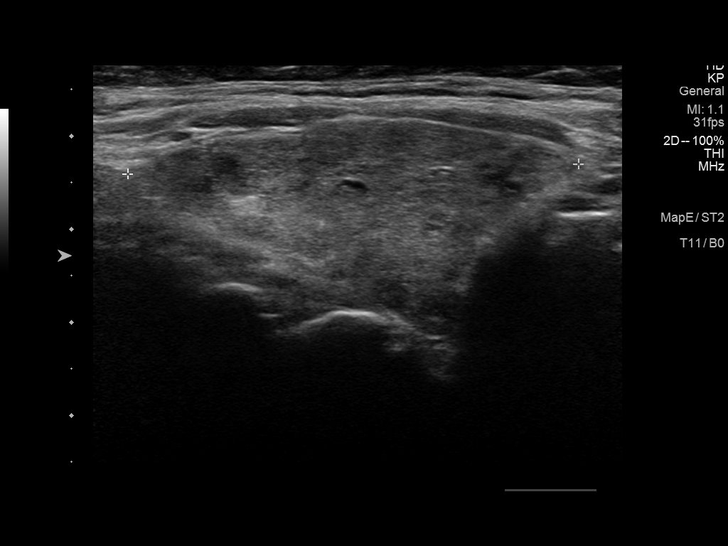
[im 8/44]
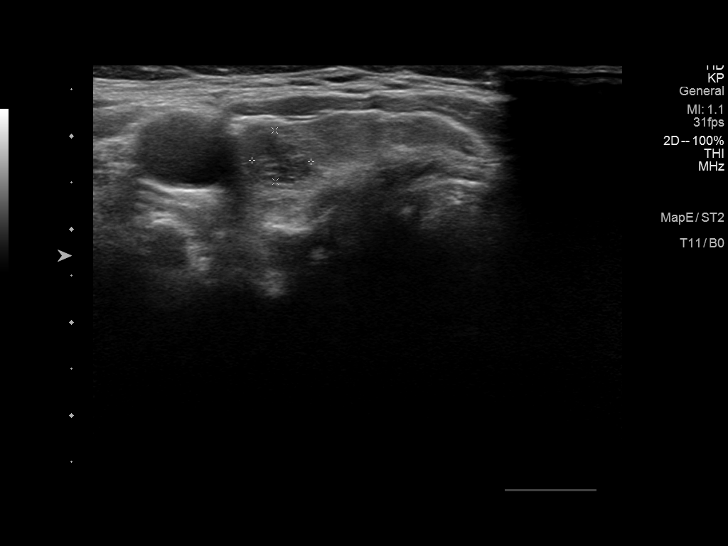
[im 11/44]
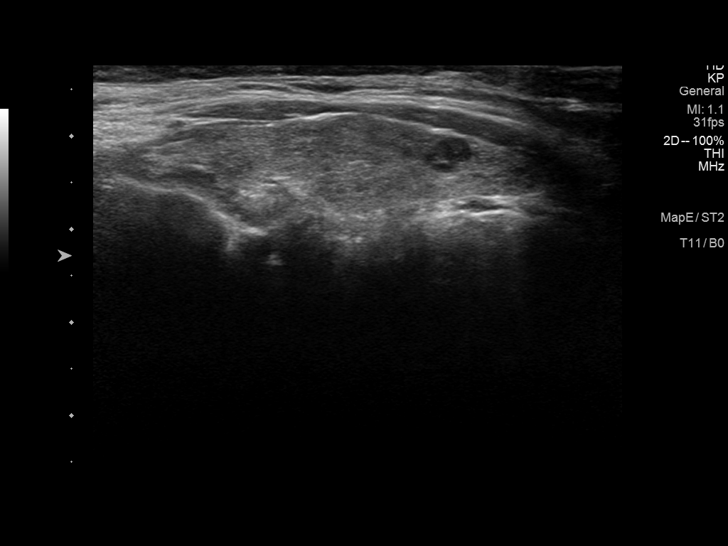
[im 15/44]
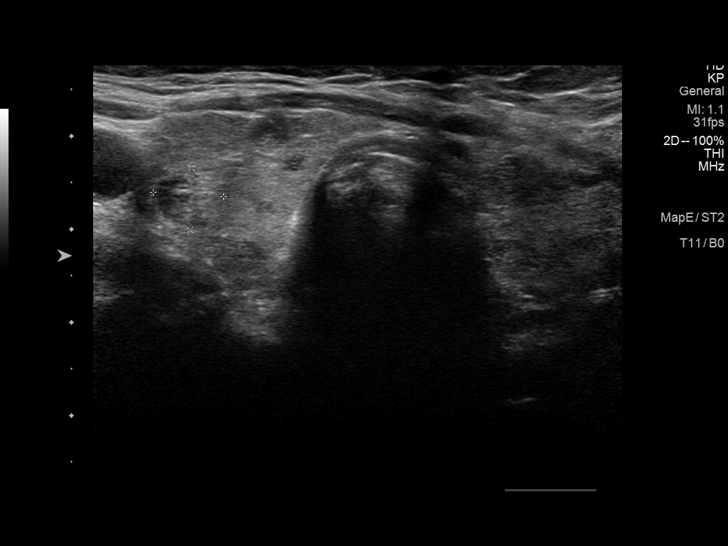
[im 18/44]
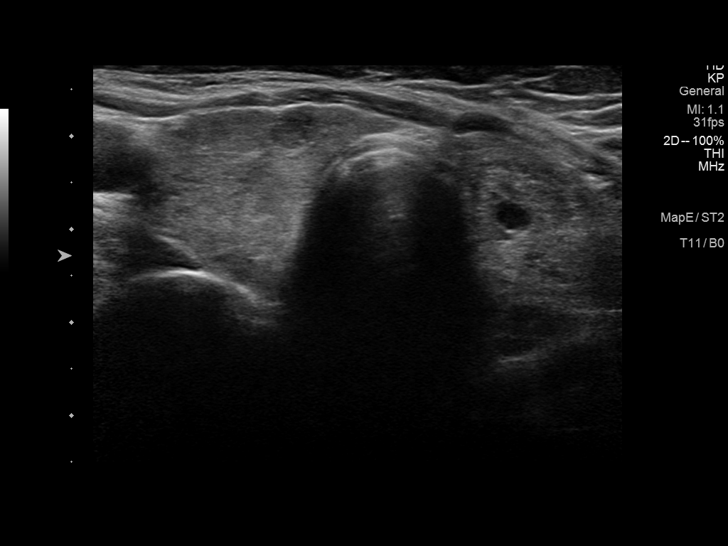
[im 22/44]
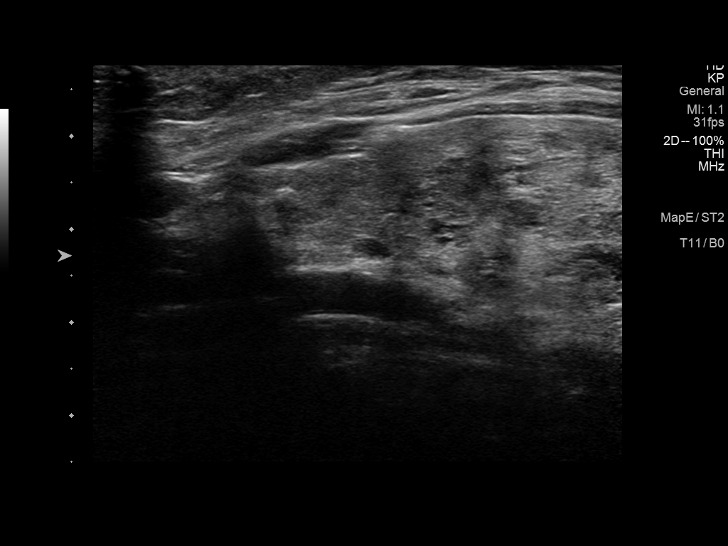
[im 26/44]
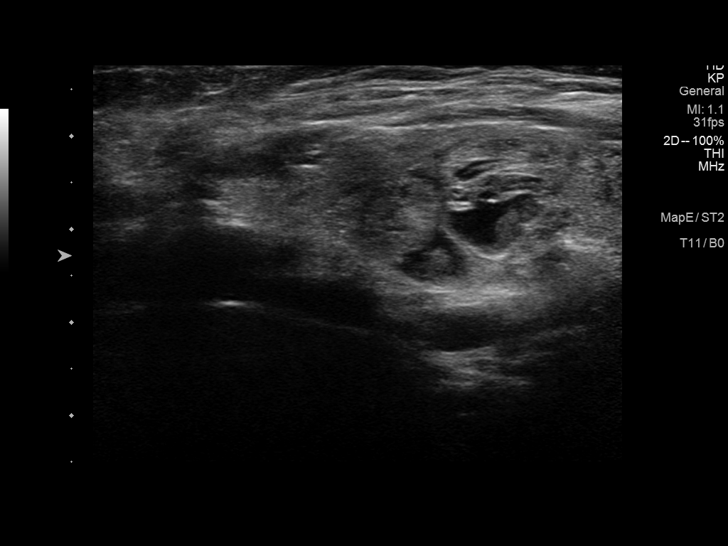
[im 29/44]
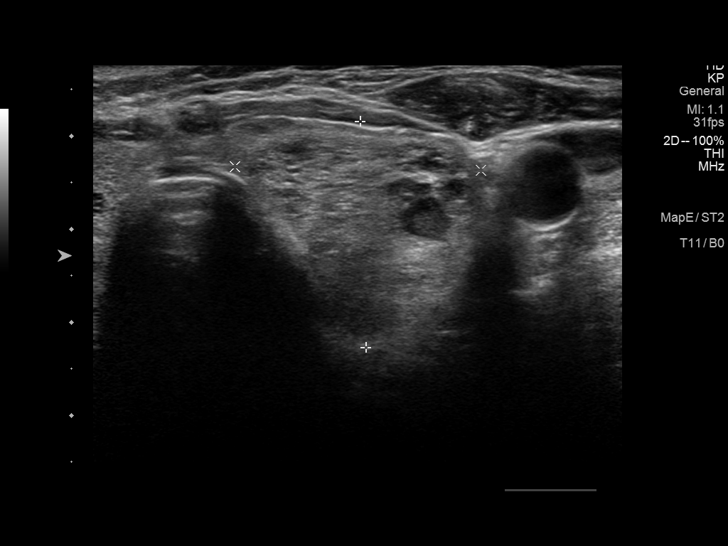
[im 33/44]
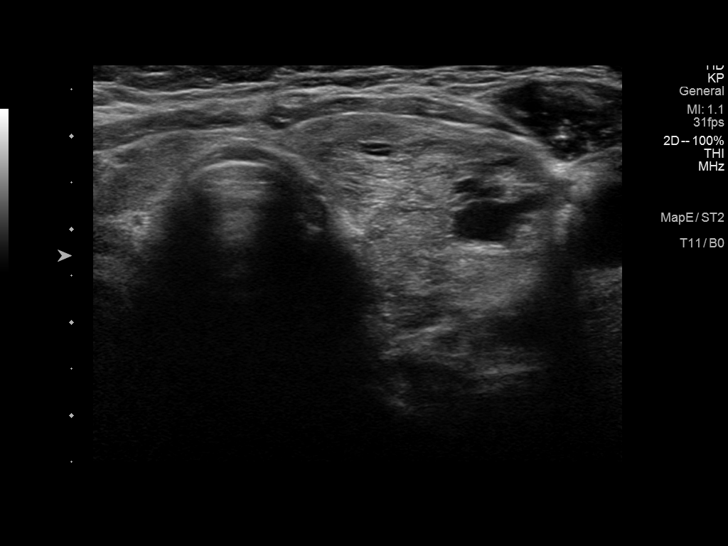
[im 36/44]
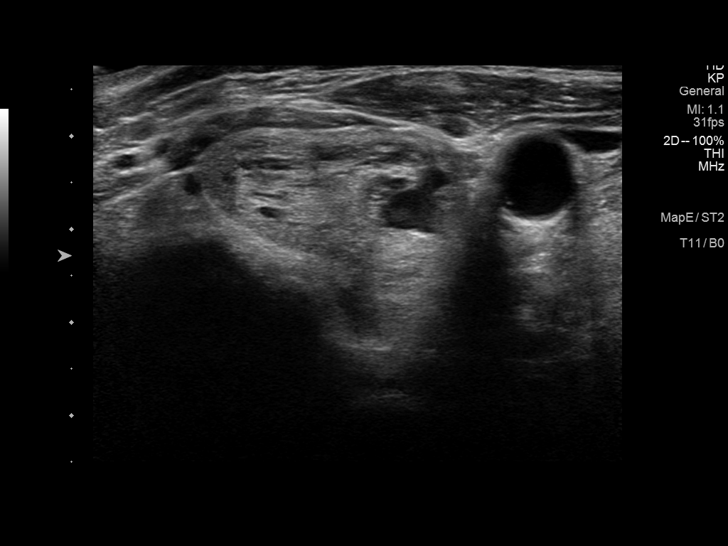
[im 40/44]
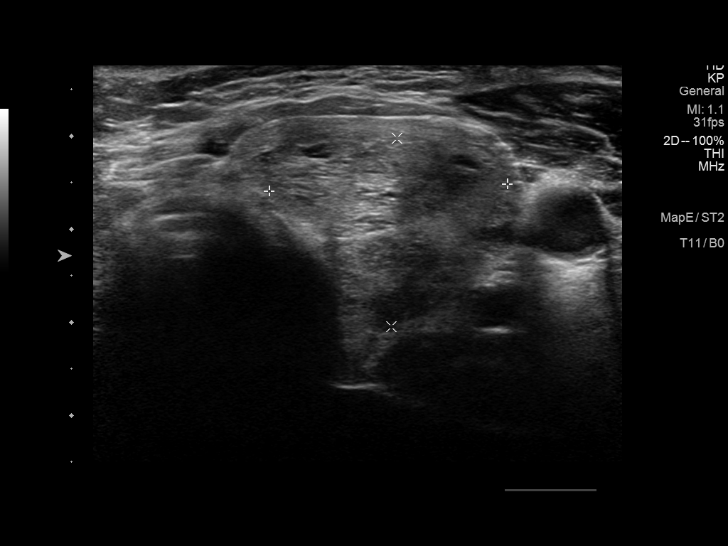
[im 44/44]
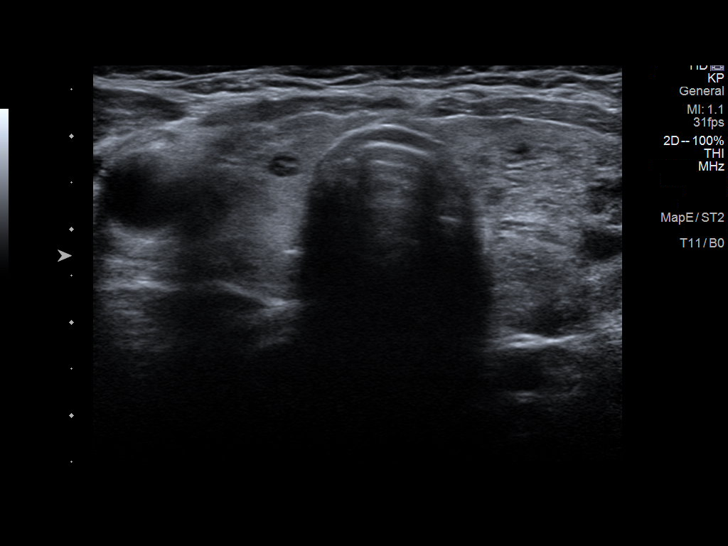

[13 of 25 positions shown; findings below may reference images not displayed]

FINDINGS: Parenchymal Echotexture: Mildly heterogenous

Isthmus: 0.3 cm

Right lobe: 4.8 x 1.9 x 2.2 cm

Left lobe: 4.9 x 2.4 x 2.6 cm

_________________________________________________________

Estimated total number of nodules >/= 1 cm: 2

Number of spongiform nodules >/=  2 cm not described below (TR1): 0

Number of mixed cystic and solid nodules >/= 1.5 cm not described
below (TR2): 0

_________________________________________________________

Nodule 1: 0.8 x 0.6 x 0.6 cm spongiform nodule in the superior right
thyroid lobe does not meet criteria for imaging surveillance or FNA.

_________________________________________________________

Nodule # 2:

Prior biopsy: No

Location: Right; inferior

Maximum size: 1.2 cm; Other 2 dimensions: 0.7 x 0.8 cm, previously,
1.1 x 0.8 x 0.8 cm

Composition: solid/almost completely solid (2)

Echogenicity: isoechoic (1)

Shape: not taller-than-wide (0)

Margins: ill-defined (0)

Echogenic foci: none (0)

ACR TI-RADS total points: 3.

ACR TI-RADS risk category:  TR3 (3 points).

Significant change in size (>/= 20% in two dimensions and minimal
increase of 2 mm): No

Change in features: No

Change in ACR TI-RADS risk category: No

ACR TI-RADS recommendations:

Given size (<1.4 cm) and appearance, this nodule does NOT meet
TI-RADS criteria for biopsy or dedicated follow-up.

_________________________________________________________

Nodule 3: Previously biopsy solid isoechoic left thyroid nodule
measuring 3.4 x 2.0 x 2.6 cm is not significantly changed in size
from prior examination where it measured 2.8 x 2.2 x 2.4 cm. Please
correlate with FNA results from [DATE].
IMPRESSION: 1. Previously biopsied left inferior thyroid nodule is unchanged in
size. Please correlate with FNA results from [DATE].
[DATE]. Right thyroid nodules do not meet criteria for FNA or imaging
surveillance.

The above is in keeping with the ACR TI-RADS recommendations - [HOSPITAL] [TJ];[DATE].

## 2021-12-05 LAB — EXTRA SPECIMEN

## 2021-12-05 LAB — ALDOSTERONE + RENIN ACTIVITY W/ RATIO
ALDO / PRA Ratio: 5.4 Ratio (ref 0.9–28.9)
Aldosterone: 3 ng/dL
Renin Activity: 0.56 ng/mL/h (ref 0.25–5.82)

## 2021-12-07 NOTE — Progress Notes (Unsigned)
Total volume 3,600 ?

## 2021-12-07 NOTE — Progress Notes (Signed)
?  Radiation Oncology         [phone] ?________________________________ ? ?Name: Dorothy Gross MRN: [id number]  ?Date of Service: 11/30/2021  DOB: 10/10/1950 ? ?Post Treatment Telephone Note ? ?Diagnosis:   Multifocal Stage IA2, cT1bN0M0, NSCLC, adenocarcinoma, EGFR mutated of the RUL nodules ? ?Intent: Curative ? ?Radiation Treatment Dates: 10/13/2021 through 10/23/2021 ?Site Technique Total Dose (Gy) Dose per Fx (Gy) Completed Fx Beam Energies  ?Lung, Right: Lung_R_SBRT IMRT 60/60 12 5/5 6XFFF  ? ?Narrative: The patient tolerated radiation therapy relatively well.   ? ? ? ? ?Impression/Plan: ?1. Multifocal Stage IA2, cT1bN0M0, NSCLC, adenocarcinoma, EGFR mutated of the RUL nodules.  I was unable to reach the patient but left a voicemail and on the message, I discussed that we would be happy to continue to follow her as needed, but she will also continue to follow up with Dr. Julien Nordmann.  ? ? ? ? ?Carola Rhine, PAC  ? ? ?  ?

## 2021-12-11 LAB — CATECHOLAMINES, FRACTIONATED, URINE, 24 HOUR
Calc Total (E+NE): 37 mcg/24 h (ref 26–121)
Creatinine, Urine mg/day-CATEUR: 1.05 g/(24.h) (ref 0.50–2.15)
Dopamine 24 Hr Urine: 150 mcg/24 h (ref 52–480)
Norepinephrine, 24H, Ur: 37 mcg/24 h (ref 15–100)
Total Volume: 3600 mL

## 2021-12-11 LAB — CORTISOL, URINE, 24 HOUR
24 Hour urine volume (VMAHVA): 3600 mL
CREATININE, URINE: 1.05 g/(24.h) (ref 0.50–2.15)
Cortisol (Ur), Free: 57.1 mcg/24 h — ABNORMAL HIGH (ref 4.0–50.0)

## 2021-12-16 ENCOUNTER — Other Ambulatory Visit: Payer: Self-pay | Admitting: Internal Medicine

## 2021-12-16 ENCOUNTER — Telehealth: Payer: Self-pay | Admitting: Internal Medicine

## 2021-12-16 DIAGNOSIS — R82998 Other abnormal findings in urine: Secondary | ICD-10-CM

## 2021-12-16 MED ORDER — DEXAMETHASONE 1 MG PO TABS: 1.0000 mg | ORAL_TABLET | Freq: Once | ORAL | 0 refills | Status: AC

## 2021-12-16 NOTE — Telephone Encounter (Signed)
Spoke to the patient on 12/16/2021 at 12:20 PM regarding slight elevation of 24-hour urinary cortisol ? ? ? Latest Reference Range & Units 12/01/21 08:52  ?Cortisol (Ur), Free 4.0 - 50.0 mcg/24 h 57.1 (H)  ? ? ? ?We will proceed with dexamethasone suppression test ? ? ?Patient was instructed to have cortisol check at 8 AM and will take dexamethasone 1 mg the prior night at 11:30 PM ? ? ?Patient expressed understanding ? ?I will also recheck her TFTs ? ? ? ?

## 2021-12-16 NOTE — Telephone Encounter (Signed)
Patient scheduled for 12/22/21 and will in at 8am. Patient aware to be fasting  ?

## 2021-12-22 ENCOUNTER — Other Ambulatory Visit (INDEPENDENT_AMBULATORY_CARE_PROVIDER_SITE_OTHER): Payer: Medicare HMO

## 2021-12-22 DIAGNOSIS — R82998 Other abnormal findings in urine: Secondary | ICD-10-CM | POA: Diagnosis not present

## 2021-12-22 LAB — T4, FREE: Free T4: 1.06 ng/dL (ref 0.60–1.60)

## 2021-12-22 LAB — TSH: TSH: 3.43 u[IU]/mL (ref 0.35–5.50)

## 2021-12-22 LAB — CORTISOL: Cortisol, Plasma: 2.9 ug/dL

## 2021-12-30 ENCOUNTER — Telehealth: Payer: Self-pay | Admitting: Internal Medicine

## 2021-12-30 DIAGNOSIS — R82998 Other abnormal findings in urine: Secondary | ICD-10-CM

## 2021-12-30 NOTE — Telephone Encounter (Signed)
Vm and my chart message sent  ?

## 2021-12-30 NOTE — Telephone Encounter (Signed)
Patient has been notified and scheduled.

## 2021-12-30 NOTE — Telephone Encounter (Signed)
Morey Hummingbird, ? ?Please let the patient know that both test for cortisol have been elevated, the elevation is slight. ? ? ?At this time I will need to do more testing so please bring her in for a fasting 8 AM labs ? ? ?Thanks ? ? ?Mack Guise, MD ? ?[facility]  ?[facility] ?[address] ?[place], [place] [address] ?Phone: [phone] ?FAX: [phone] ? ?

## 2021-12-31 ENCOUNTER — Other Ambulatory Visit (INDEPENDENT_AMBULATORY_CARE_PROVIDER_SITE_OTHER): Payer: Medicare HMO

## 2021-12-31 DIAGNOSIS — R82998 Other abnormal findings in urine: Secondary | ICD-10-CM | POA: Diagnosis not present

## 2021-12-31 LAB — CORTISOL: Cortisol, Plasma: 10.7 ug/dL

## 2022-01-01 ENCOUNTER — Telehealth: Payer: Self-pay | Admitting: Pulmonary Disease

## 2022-01-04 NOTE — Telephone Encounter (Signed)
Spoke with the pt ?I advised her to call billing at [phone] for itemized statements  ?I printed her path report from [facility] and mailed to her home address which I verified per pt req ?Nothing further needed ?

## 2022-01-05 ENCOUNTER — Ambulatory Visit
Admission: RE | Admit: 2022-01-05 | Discharge: 2022-01-05 | Disposition: A | Discharge status: Home / Self Care | Payer: Medicare HMO | Attending: Urology | Admitting: Urology

## 2022-01-05 ENCOUNTER — Other Ambulatory Visit
Admission: RE | Admit: 2022-01-05 | Discharge: 2022-01-05 | Disposition: A | Discharge status: Home / Self Care | Payer: Medicare HMO | Source: Home / Self Care | Attending: Urology | Admitting: Urology

## 2022-01-05 ENCOUNTER — Other Ambulatory Visit: Payer: Self-pay | Admitting: *Deleted

## 2022-01-05 ENCOUNTER — Ambulatory Visit
Admission: RE | Admit: 2022-01-05 | Discharge: 2022-01-05 | Disposition: A | Discharge status: Home / Self Care | Payer: Medicare HMO | Source: Ambulatory Visit | Attending: Urology | Admitting: Urology

## 2022-01-05 ENCOUNTER — Encounter: Payer: Self-pay | Admitting: Urology

## 2022-01-05 ENCOUNTER — Ambulatory Visit: Payer: Medicare HMO | Admitting: Urology

## 2022-01-05 VITALS — BP 131/77 | HR 83 | Ht 64.0 in | Wt 182.0 lb

## 2022-01-05 DIAGNOSIS — N200 Calculus of kidney: Secondary | ICD-10-CM

## 2022-01-05 DIAGNOSIS — R109 Unspecified abdominal pain: Secondary | ICD-10-CM

## 2022-01-05 LAB — URINALYSIS, COMPLETE (UACMP) WITH MICROSCOPIC
Bilirubin Urine: NEGATIVE
Glucose, UA: NEGATIVE mg/dL
Ketones, ur: NEGATIVE mg/dL
Leukocytes,Ua: NEGATIVE
Nitrite: NEGATIVE
Protein, ur: NEGATIVE mg/dL
Specific Gravity, Urine: <1.005 — ABNORMAL LOW (ref 1.005–1.030)
WBC, UA: NONE SEEN WBC/hpf (ref 0–5)
pH: 5.5 (ref 5.0–8.0)

## 2022-01-05 IMAGING — CR DG ABDOMEN 1V
2 series · 2 of 2 positions shown · non-contrast
Comparison: PET-CT [DATE]

CLINICAL DATA: History of flank pain.  Evaluate for renal stones.

EXAM:
ABDOMEN - 1 VIEW

[abdomen kub (1 of 2)]
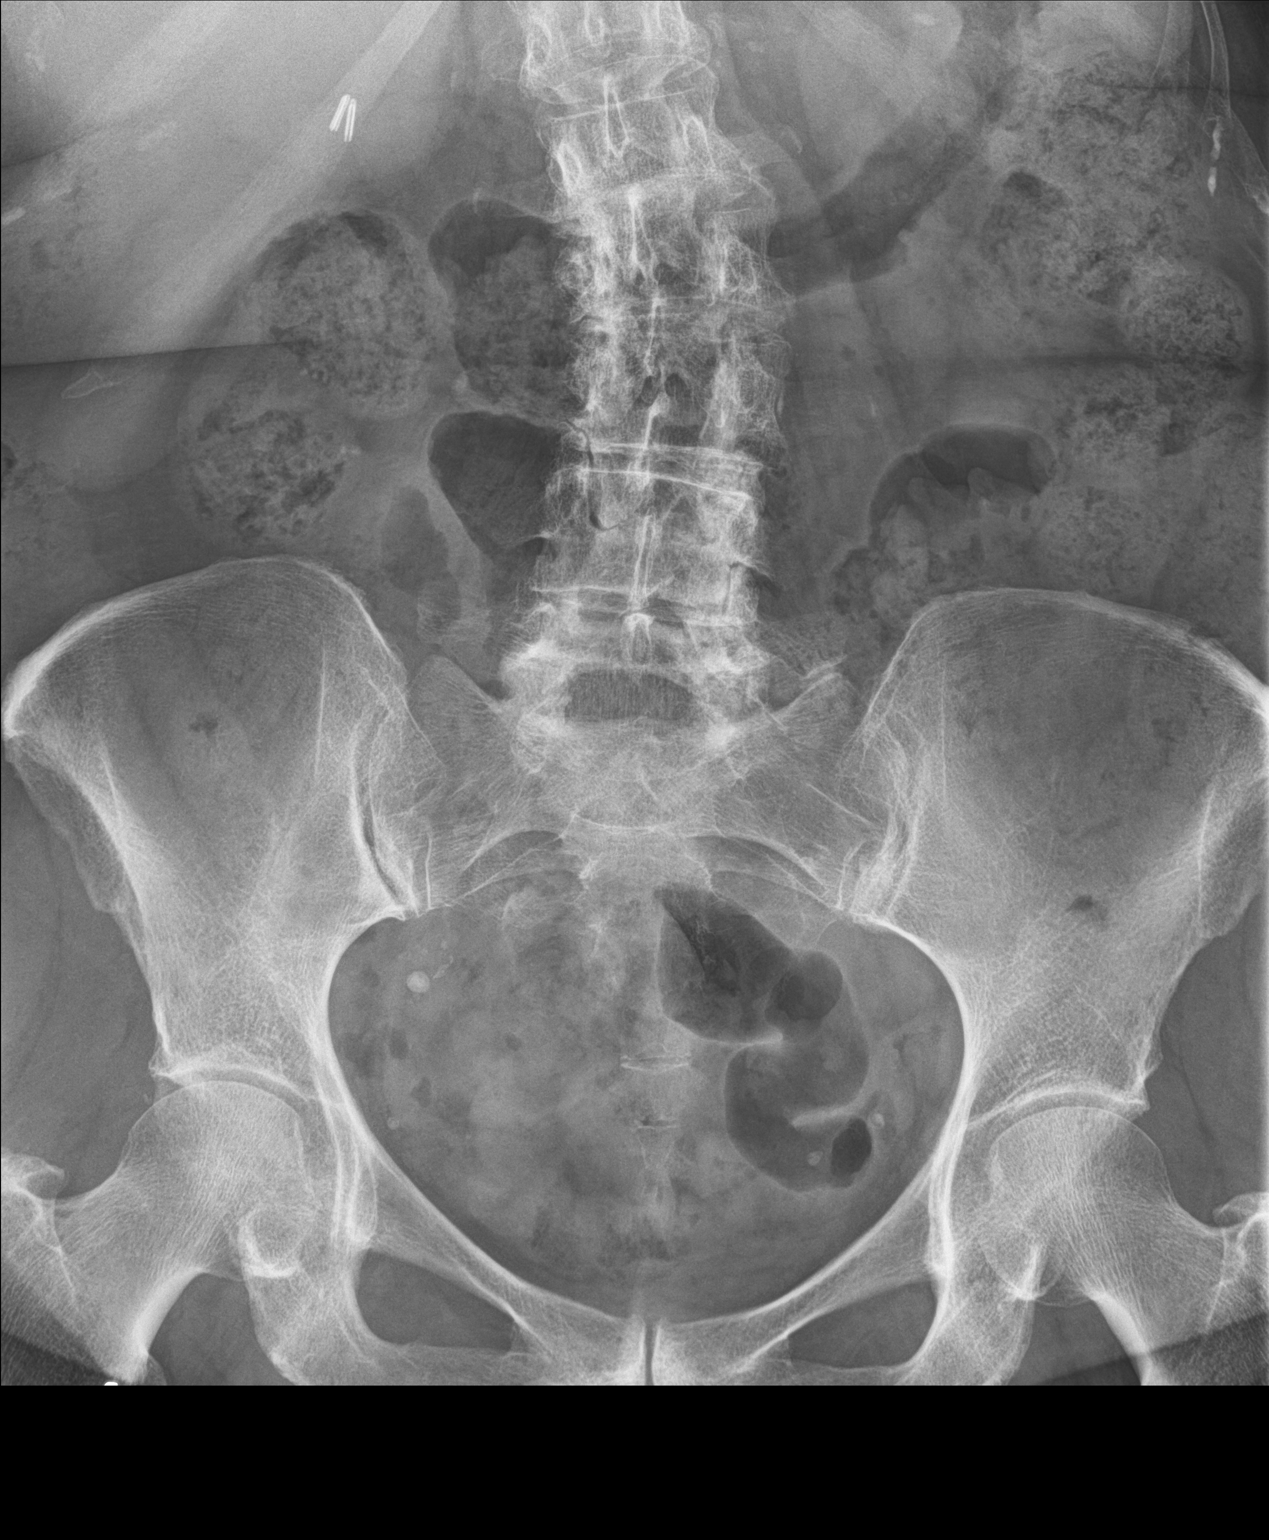

[abdomen kub (2 of 2)]
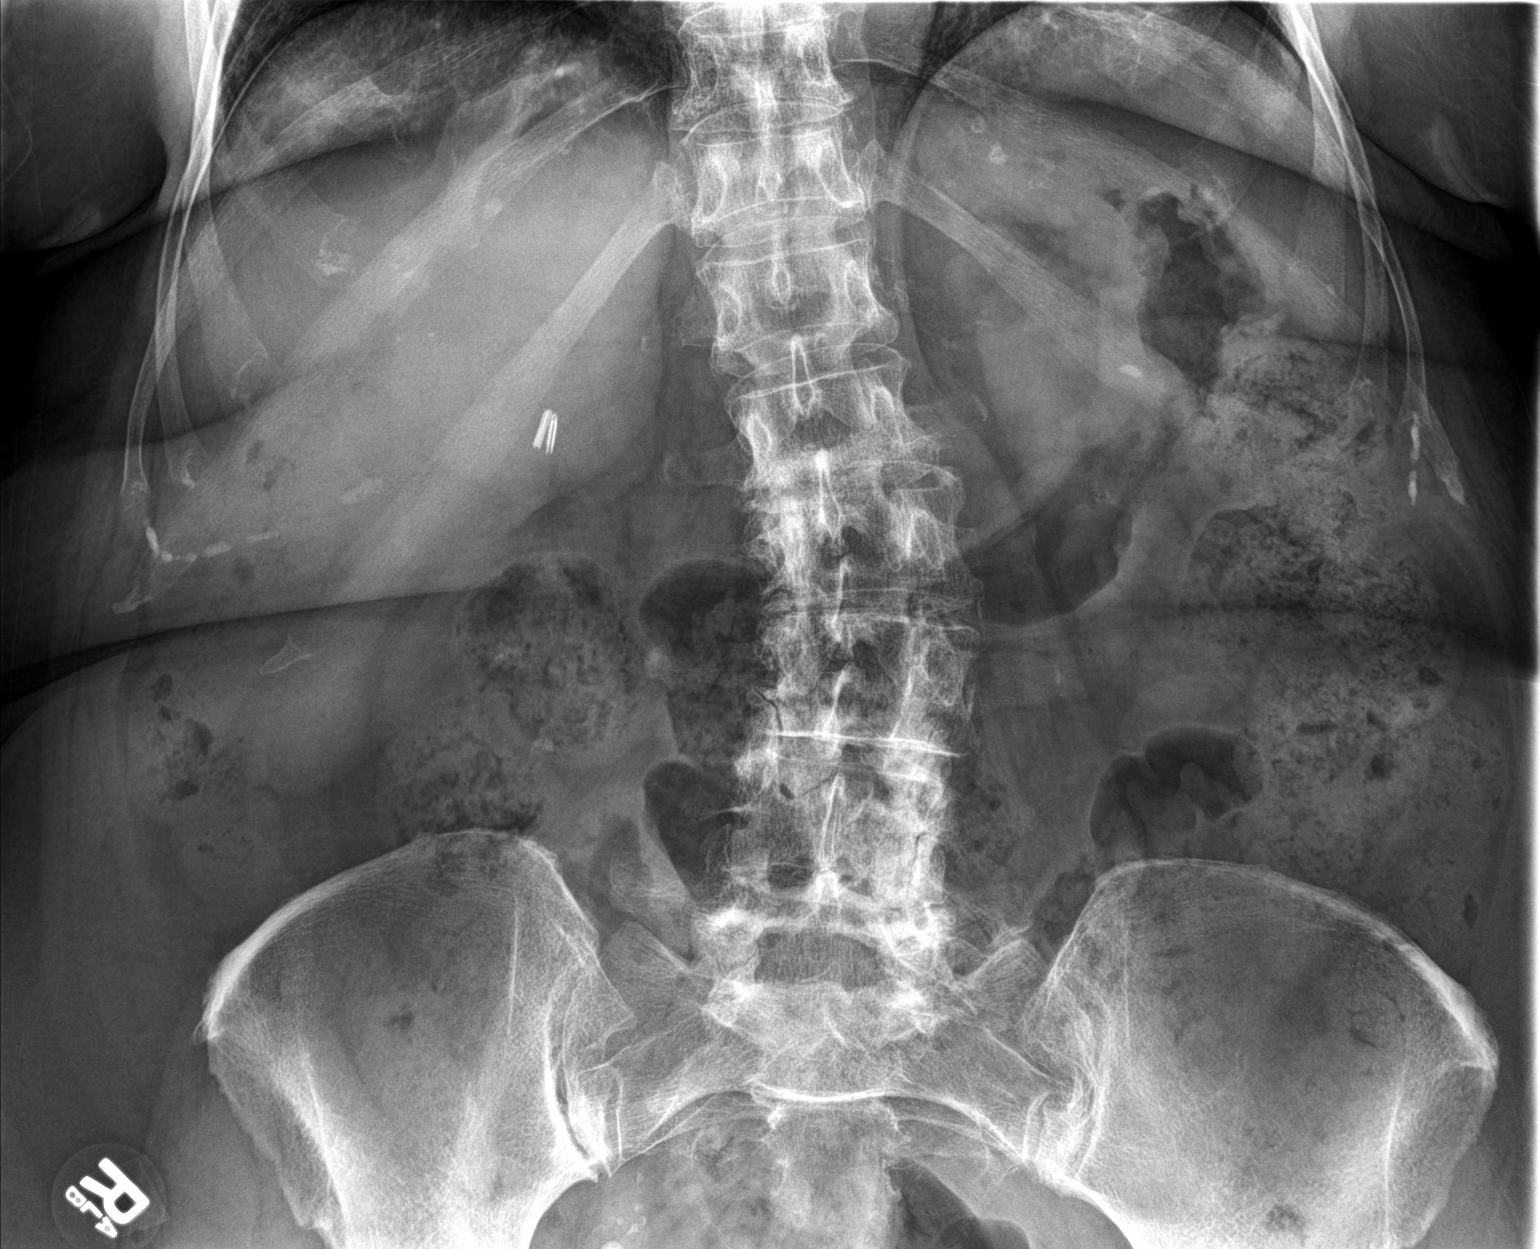

[2 of 2 positions shown; findings below may reference images not displayed]

FINDINGS: Multiple punctate calcifications projecting within the expected
location of the right kidney. Small punctate calcification
projecting in the expected location of the left kidney. Probable
phleboliths within the pelvis. Lumbar spine degenerative changes.
IMPRESSION: Findings most suggestive of right-greater-than-left nephrolithiasis.

## 2022-01-05 NOTE — Progress Notes (Signed)
? ?  01/05/2022 ?11:41 AM  ? ?Dorothy Gross ?06-15-1951 ?[id number] ? ?Reason for visit: Back pain, history of nephrolithiasis ? ?HPI: ?71 year old female with history of gross hematuria and some abnormal ureteral thickening on CT as well as bilateral moderate renal stone burden who underwent uncomplicated bilateral ureteroscopy and laser lithotripsy in July 2021.  Follow-up ultrasound in September 2021 showed no hydronephrosis or nephrolithiasis. ? ?She reports a few days of bilateral low back pain that may be a little bit worse on the right side.  She is worried this could potentially be another kidney stone.  She also was having some right-sided neck pain that has improved.  Urinalysis today is benign.   ? ?I personally viewed and interpreted the KUB today that shows no obvious evidence of ureteral stones.  There are some small nonobstructive appearing right lower pole stones that are stable from prior PET/CT. ? ?Discussed that with her KUB findings and benign urinalysis this is less likely to be related to kidney stones.  I recommended discussing with her PCP and considering further evaluation with either CT scan or even MRI if this is more of a disc/nerve issue.  Return precautions were discussed. ? ?Follow-up with urology as needed ? ? ?Billey Co, MD ? ?[facility] ?[address] ?[place], [place] [address] ?[phone] ? ?

## 2022-01-06 LAB — ACTH: C206 ACTH: 8 pg/mL (ref 6–50)

## 2022-01-08 ENCOUNTER — Telehealth: Payer: Self-pay | Admitting: Internal Medicine

## 2022-01-08 NOTE — Telephone Encounter (Signed)
Discussed lab results with the patient on 01/08/2022 ? ? ? ?She had a slightly elevated 24-hour urinary cortisol at 57 (reference max 50) ? ?Her dexamethasone suppression test also came back elevated at 2.9 (reference 1.8) ? ? ?Patient has no clinical symptoms of Cushing syndrome ? ?ACTH low end of normal with normal serum cortisol ? ? ?I discussed with the patient with slight elevation of cortisol and no clinical symptoms of Cushing syndrome this could be false reading due to stress, recent diagnosis of lung cancer, recent nephrolithiasis and pain ?She completed radiation treatment October 23, 2021 ? ?We have opted to retest in 4 months, if things have become more stable regarding her treatment ? ? ?Patient in agreement of this ? ? ?

## 2022-01-11 NOTE — Telephone Encounter (Signed)
Patient scheduled.

## 2022-01-12 ENCOUNTER — Telehealth: Payer: Self-pay | Admitting: Physician Assistant

## 2022-01-12 NOTE — Telephone Encounter (Signed)
Rescheduled 06/08 to 06/07 due to provider pal, called and left a voicemail. ?

## 2022-01-26 ENCOUNTER — Ambulatory Visit (HOSPITAL_COMMUNITY)
Admission: RE | Admit: 2022-01-26 | Discharge: 2022-01-26 | Disposition: A | Discharge status: Home / Self Care | Payer: Medicare HMO | Source: Ambulatory Visit | Attending: Internal Medicine | Admitting: Internal Medicine

## 2022-01-26 DIAGNOSIS — C3490 Malignant neoplasm of unspecified part of unspecified bronchus or lung: Secondary | ICD-10-CM | POA: Diagnosis present

## 2022-01-26 DIAGNOSIS — E119 Type 2 diabetes mellitus without complications: Secondary | ICD-10-CM | POA: Insufficient documentation

## 2022-01-26 LAB — POCT I-STAT CREATININE: Creatinine, Ser: 0.8 mg/dL (ref 0.44–1.00)

## 2022-01-26 IMAGING — CT CT CHEST W/ CM
2 of 4 series · 13 of 36 positions shown, 16 images · IV contrast (OMNIPAQUE)
Comparison: [DATE] PET-CT.  Most recent CT chest [DATE].

CLINICAL DATA: Primary Cancer Type: Lung
TECHNIQUE: Multidetector CT imaging of the chest was performed during
intravenous contrast administration.

[Series 2: axial st · axial · 0.74mm/px · z∈[-133,+133]mm · 10 of 159 slices shown, 13 images]
[im 13/159  mediastinal]
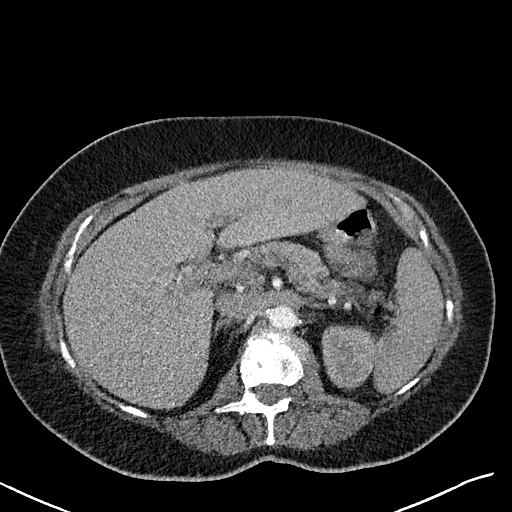
[im 13/159  lung]
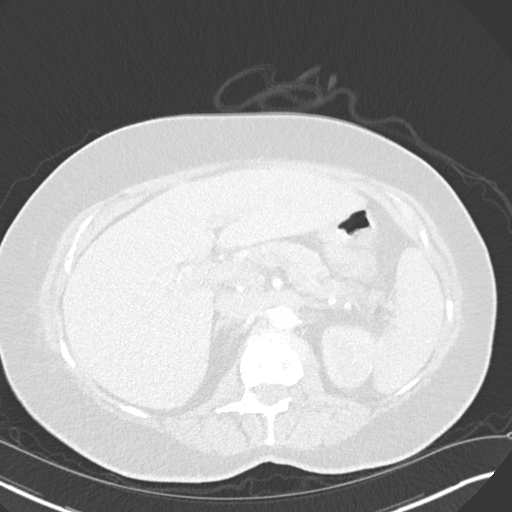
[im 25/159  lung]
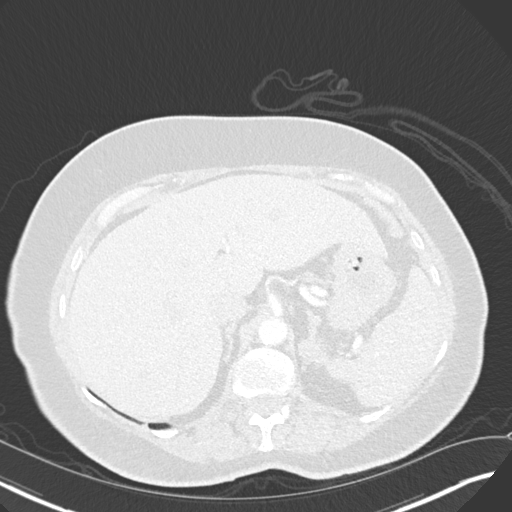
[im 49/159  lung]
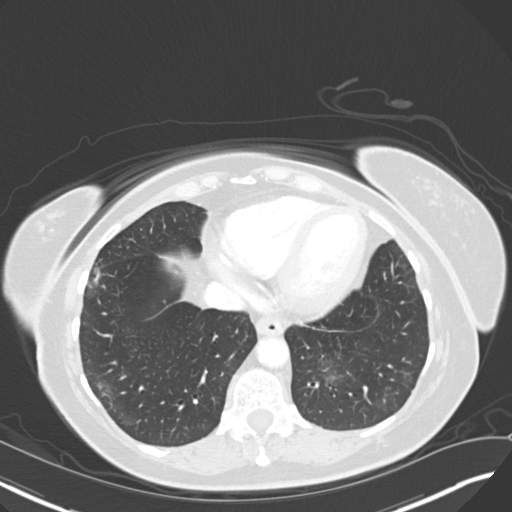
[im 61/159  lung]
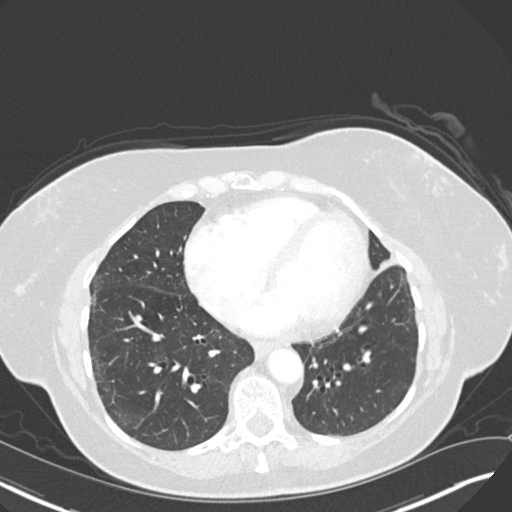
[im 73/159  mediastinal]
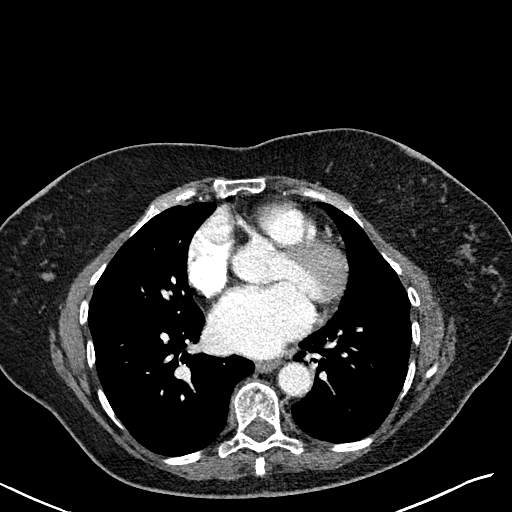
[im 73/159  lung]
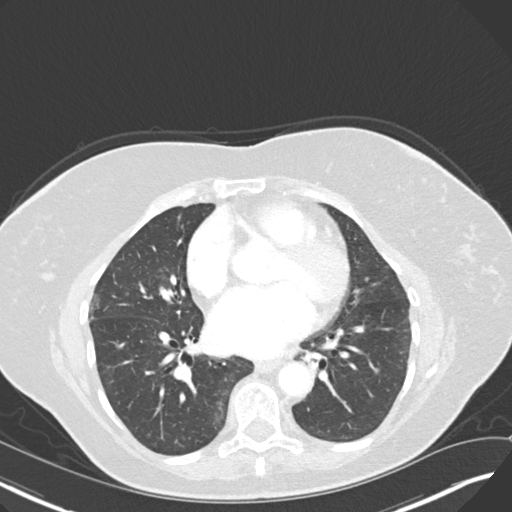
[im 86/159  lung]
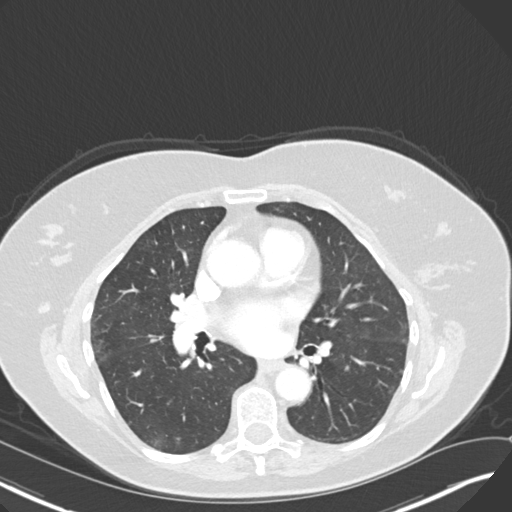
[im 98/159  lung]
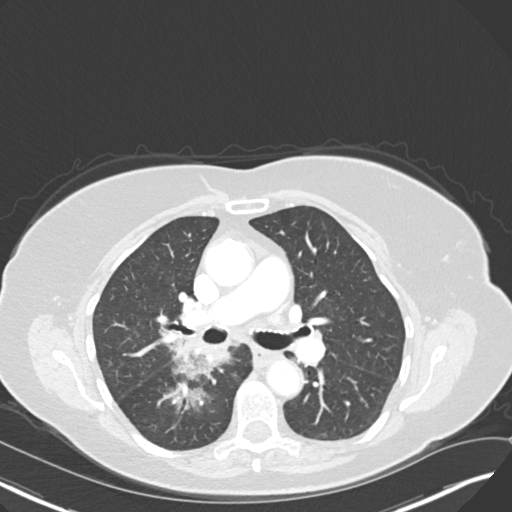
[im 122/159  lung]
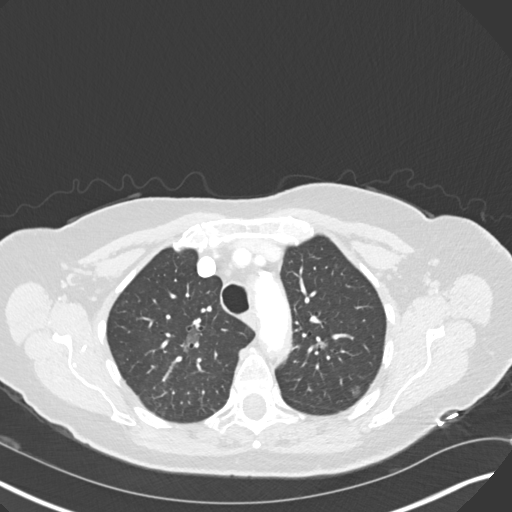
[im 134/159  mediastinal]
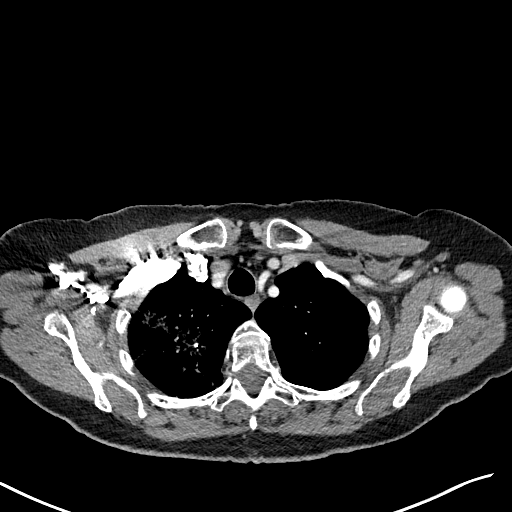
[im 134/159  lung]
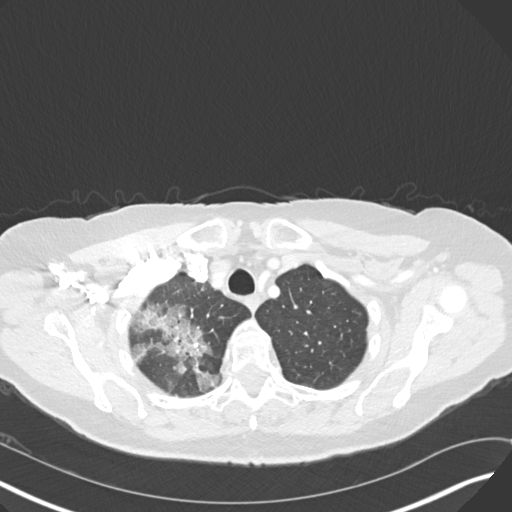
[im 146/159  lung]
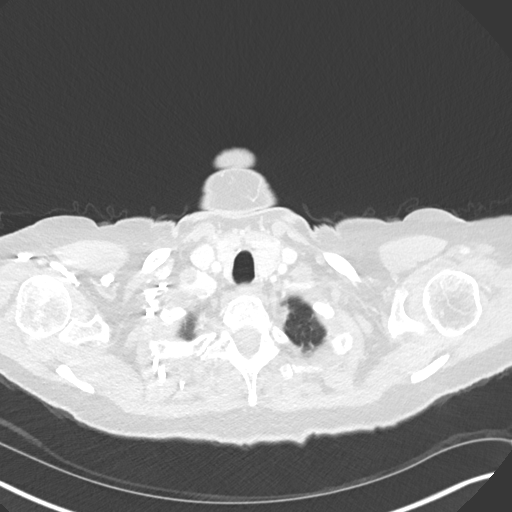

[Series 6: coronal · coronal · 0.67mm/px · 3 of 117 slices shown]
[im 24/117  lung]
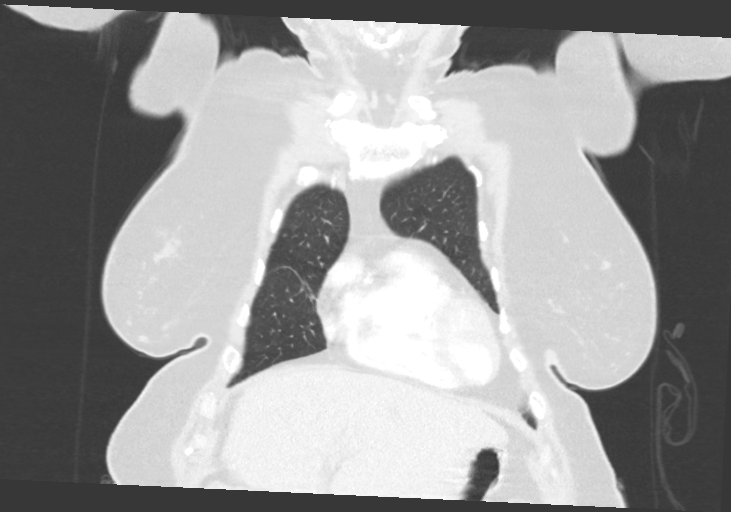
[im 47/117  lung]
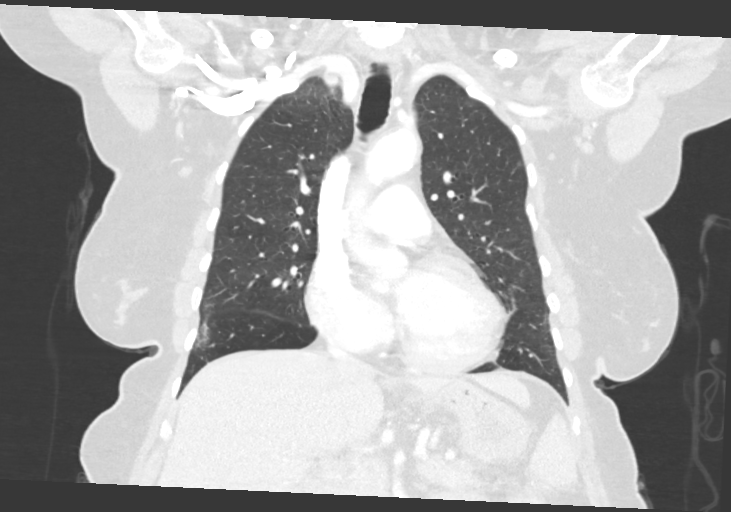
[im 70/117  lung]
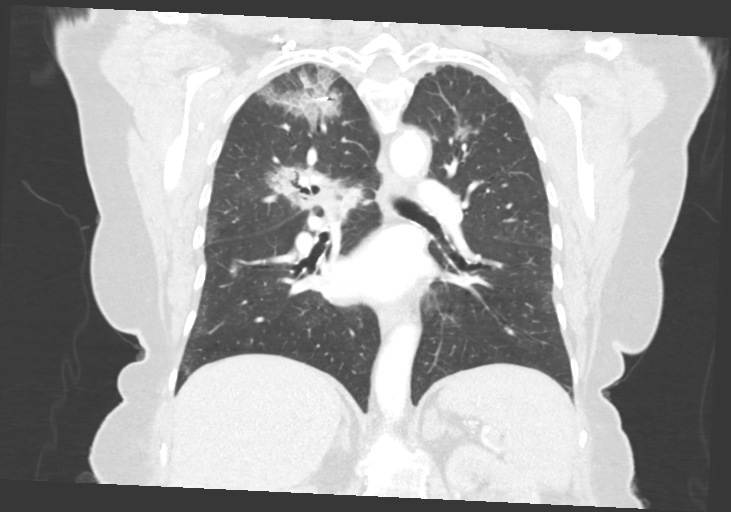

[13 of 36 positions shown; findings below may reference images not displayed]

Imaging Indication: Assess response to therapy

Interval therapy since last imaging? Yes

Initial Cancer Diagnosis

Date: [DATE]; Established by: Biopsy-proven

Detailed Pathology: Non-small cell lung cancer, adenocarcinoma.

Primary Tumor location: Right upper and lower lobe.

Surgeries: Cholecystectomy [7R].  Hysterectomy, appendectomy.

Chemotherapy: No

Immunotherapy? No

Radiation therapy? Yes; Date Range: [DATE] - [DATE]; Target:
Right lung

* Tracking Code: BO *

EXAM:
CT CHEST WITH CONTRAST
RADIATION DOSE REDUCTION: This exam was performed according to the
departmental dose-optimization program which includes automated
exposure control, adjustment of the mA and/or kV according to
patient size and/or use of iterative reconstruction technique.

CONTRAST:  75mL OMNIPAQUE IOHEXOL 300 MG/ML  SOLN
FINDINGS: Cardiovascular: Aortic atherosclerosis. Tortuous thoracic aorta.
Mild cardiomegaly, without pericardial effusion. Left main and LAD
coronary artery calcification.

Pulmonary artery enlargement, outflow tract 3.1 cm. No central
pulmonary embolism, on this non-dedicated study.

Mediastinum/Nodes: Left-sided thyroid nodule was detailed on prior
PET. No supraclavicular adenopathy.

No mediastinal or hilar adenopathy.

Lungs/Pleura: No pleural fluid. Radiation fiducials in the right
apex. Lateral right apical ground-glass and septal thickening are
new and presumably radiation induced. The right apical ground-glass
nodule on the prior CT may again be identified at 1.3 cm on [DATE].
Alternatively, this may simply represent lateral extension of the
presumed radiation change.

Airspace and ground-glass disease in the posterior right upper lobe,
presumably obscuring the previously described 1.8 cm mixed
attenuation nodule on [DATE]. This presumed radiation change
extends into the superior segment right lower lobe.

Scattered left-sided ground-glass nodules are similar, including the
posterior left upper lobe at 7 mm on 33/5. Smaller lesions
identified within the superior segment left lower lobe at 4 mm on
47/5 and anterior left upper lobe at 6 mm on 54/5.

Subpleural right upper lobe 3 mm pulmonary nodule is unchanged on
62/5.

The 7 mm right lower lobe ground-glass nodule is not significantly
changed on 81/5.

Upper Abdomen: Cholecystectomy. Minimal motion degradation. Normal
imaged portions of the liver, spleen, stomach, pancreas, kidneys.
Chronic bilateral adrenal thickening and nodularity are unchanged.

Musculoskeletal: Accentuation of expected thoracic kyphosis. Lower
thoracic spondylosis.
IMPRESSION: 1. Multifocal right-sided radiation change. This presumably obscures
the posterior right upper lobe lesion and may obscure the right
apical lesion. Other smaller ground-glass nodules are not
significantly changed and may represent multifocal adenocarcinoma.
2. No thoracic adenopathy.
3. Coronary artery atherosclerosis. Aortic Atherosclerosis
([7R]-[7R]).
4. Pulmonary artery enlargement suggests pulmonary arterial
hypertension.

## 2022-01-26 MED ORDER — IOHEXOL 300 MG/ML  SOLN
100.0000 mL | Freq: Once | INTRAMUSCULAR | Status: AC | PRN
  Administered 2022-01-26: 75 mL via INTRAVENOUS

## 2022-01-26 MED ORDER — SODIUM CHLORIDE (PF) 0.9 % IJ SOLN
INTRAMUSCULAR | Status: DC
Start: 2022-01-26 — End: 2022-01-27
  Filled 2022-01-26: qty 50

## 2022-01-29 ENCOUNTER — Encounter: Payer: Self-pay | Admitting: Internal Medicine

## 2022-01-29 ENCOUNTER — Encounter: Payer: Self-pay | Admitting: *Deleted

## 2022-01-29 NOTE — Progress Notes (Signed)
Oncology Nurse Navigator Documentation     01/29/2022    1:00 PM 11/12/2021   12:00 PM 10/09/2021    1:00 PM 10/07/2021    2:00 PM 10/06/2021    3:00 PM  Oncology Nurse Navigator Flowsheets  Abnormal Finding Date     07/02/2021  Confirmed Diagnosis Date     09/14/2021  Diagnosis Status   Confirmed Diagnosis Complete  Pending Molecular Studies  Planned Course of Treatment     Radiation  Phase of Treatment     Radiation  Radiation Actual Start Date:     10/13/2021  Navigator Follow Up Date: 02/03/2022 02/02/2022 10/12/2021  10/08/2021  Navigator Follow Up Reason: Follow-up Appointment Follow-up Appointment Patient Call  Pathology  Navigator Location [place]  Navigator Encounter Type Telephone Appt/Treatment Plan Review Telephone Other: Clinic/MDC;Initial MedOnc  Telephone Incoming Call;Outgoing Call  Outgoing Call    Treatment Initiated Date     10/13/2021  Patient Visit Type   Other Other Initial;MedOnc  Treatment Phase Other Post-Tx Follow-up Pre-Tx/Tx Discussion    Barriers/Navigation Needs Education;Coordination of Care Coordination of Care Coordination of Care;Education Coordination of Care Education;Coordination of Care  Education Other  Other  Newly Diagnosed Cancer Education;Other  Interventions Coordination of Care;Education;Psycho-Social Support/Dorothy Gross call me and had a few questions. I clarified. She also wants to let Dr. Julien Nordmann and Rubin Payor that she has a beach trip week of June 17th-24th and would like to start any treatments after that time. I explained that I will update Dr. Julien Nordmann and Rubin Payor. I will contact [employer] regarding her having questions about a cancer policy.  Coordination of Care Coordination of Care;Education Coordination of Care Coordination of Care;Education  Acuity Level 3-Moderate Needs (3-4 Barriers Identified) Level 2-Minimal Needs (1-2 Barriers Identified) Level 2-Minimal Needs (1-2  Barriers Identified) Level 2-Minimal Needs (1-2 Barriers Identified) Level 3-Moderate Needs (3-4 Barriers Identified)  Coordination of Care Other Other Appts Pathology Pathology  Education Method Verbal  Verbal  Verbal;Other  Time Spent with Patient 45 30 30 15  45

## 2022-01-29 NOTE — Progress Notes (Signed)
Received referral from navigator patient had questions regarding [place] Policy.  Called patient to introduce myself as Arboriculturist and to offer further assistance. Patient states she has a Science writer and wanted to go over everything and that she was doing it correctly with someone. Advised patient she would need to contact her carrier for specific questions regarding what is needed. Based on information she states she was needing, I provided her the contact number to billing at [phone]. I also advised her as far as records needed such as Pathology report she states is needed, she would need to appear in person at [facility] front desk to complete a release form and cover sheet on how she needed it to be returned. She would then be contacted when information is available for pick-up. She verbalized understanding and was very Patent attorney.

## 2022-01-29 NOTE — Progress Notes (Signed)
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 Molecular Study by [employer] [facility]: Positive EGFR V769M, G719S PDL1 Expression: 0%  PRIOR THERAPY: SBRT to the right upper lobe lung nodule under the care of Dr. Lisbeth Renshaw completed on 10/19/2021  CURRENT THERAPY: Observation   INTERVAL HISTORY: Dorothy Gross 71 y.o. female returns to the clinic today for a follow-up visit accompanied by her daughter.  The patient has a history of multifocal adeno carcinoma status post SBRT to the right upper lobe nodule under the care of Dr. Lisbeth Renshaw.  This was completed in February 2023.  The patient is feeling fairly well today without any concerning complaints. She denies any fever, chills, night sweats, or unexplained weight loss. Denies any chest pain, dyspnea on exertion, or hemoptysis.  She denies any significant cough. She may have a mild cough related to allergies. She denies any nausea, vomiting, diarrhea, or constipation.  Denies any headache or visual changes.  She recently had a restaging CT scan performed.  She also had an MRI of the cervical spine to follow up on an indeterminate area on C4 on her staging brain MRI. She is here today for evaluation and repeat blood work and to review her scan results.  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  MEDICATIONS:  Current Outpatient Medications   Medication Sig Dispense Refill   acetaminophen (TYLENOL) 500 MG tablet Take 1,000 mg by mouth every 6 (six) hours as needed.     amiodarone (PACERONE) 200 MG tablet Take 200 mg by mouth daily.      apixaban (ELIQUIS) 5 MG TABS tablet Take 5 mg by mouth 2 (two) times daily.      BIOTIN PO Take 1 capsule by mouth daily.     calcium carbonate (OS-CAL - DOSED IN MG OF ELEMENTAL CALCIUM) 1250 (500 Ca) MG tablet Take 1 tablet by mouth daily with breakfast.     carvedilol (COREG) 6.25 MG tablet Take 6.25 mg by mouth 2 (two) times daily with a meal.      Cholecalciferol (VITAMIN D3) 1000 units CAPS Take 1,000 Units by mouth daily.      cyanocobalamin 1000 MCG tablet Take 1,000 mcg by mouth daily.      digoxin (LANOXIN) 0.125 MG tablet Take 0.125 mg by mouth daily.      ferrous sulfate 325 (65 FE) MG tablet Take 325 mg by mouth every other day.     Flaxseed, Linseed, (FLAX SEED OIL PO) Take 2 capsules by mouth daily.     furosemide (LASIX) 40 MG tablet Take 40 mg by mouth daily.      glucose blood (ONETOUCH ULTRA) test strip USE 1 EACH (1 STRIP TOTAL) 3 (THREE) TIMES DAILY     Lancets MISC      losartan (COZAAR) 25 MG tablet Take 50 mg by  mouth daily.     losartan (COZAAR) 25 MG tablet Take 2 tablets by mouth daily.     metFORMIN (GLUCOPHAGE-XR) 500 MG 24 hr tablet Take 1,000 mg by mouth daily with supper.     Multiple Vitamins-Minerals (MULTIVITAMIN ADULT PO) Take 1 tablet by mouth daily.     Polyethyl Glycol-Propyl Glycol (SYSTANE OP) Place 1 drop into both eyes daily as needed (dry eyes).     rosuvastatin (CRESTOR) 10 MG tablet Take 10 mg by mouth daily.      sodium chloride (OCEAN) 0.65 % SOLN nasal spray Place 1 spray into both nostrils daily as needed (Dry nose).     zinc gluconate 50 MG tablet Take 50 mg by mouth daily.      No current facility-administered medications for this visit.    SURGICAL HISTORY:  Past Surgical History:  Procedure Laterality Date   ABDOMINAL HYSTERECTOMY      APPENDECTOMY     BREAST EXCISIONAL BIOPSY Right [id number]   negative 1974 negative 2015   BRONCHIAL BIOPSY  03/23/2021   Procedure: BRONCHIAL BIOPSIES;  Surgeon: Garner Nash, DO;  Location: [place];  Service: Pulmonary;;   BRONCHIAL BIOPSY  09/14/2021   Procedure: BRONCHIAL BIOPSIES;  Surgeon: Garner Nash, DO;  Location: [place];  Service: Pulmonary;;   BRONCHIAL BRUSHINGS  03/23/2021   Procedure: BRONCHIAL BRUSHINGS;  Surgeon: Garner Nash, DO;  Location: [place] [facility];  Service: Pulmonary;;   BRONCHIAL BRUSHINGS  09/14/2021   Procedure: BRONCHIAL BRUSHINGS;  Surgeon: Garner Nash, DO;  Location: [place];  Service: Pulmonary;;   BRONCHIAL NEEDLE ASPIRATION BIOPSY  09/14/2021   Procedure: BRONCHIAL NEEDLE ASPIRATION BIOPSIES;  Surgeon: Garner Nash, DO;  Location: [place];  Service: Pulmonary;;   BRONCHIAL WASHINGS  03/23/2021   Procedure: BRONCHIAL WASHINGS;  Surgeon: Garner Nash, DO;  Location: [place];  Service: Pulmonary;;   CHOLECYSTECTOMY N/A 12/05/2015   Procedure: LAPAROSCOPIC CHOLECYSTECTOMY WITH INTRAOPERATIVE CHOLANGIOGRAM;  Surgeon: Leonie Green, MD;  Location: [facility];  Service: General;  Laterality: N/A;   COLONOSCOPY WITH PROPOFOL N/A 10/18/2016   Procedure: COLONOSCOPY WITH PROPOFOL;  Surgeon: Manya Silvas, MD;  [facility];  Service: Endoscopy;  Laterality: N/A;   COLONOSCOPY WITH PROPOFOL N/A 03/17/2018   Procedure: COLONOSCOPY WITH PROPOFOL;  Surgeon: Manya Silvas, MD;  Location: [facility];  Service: Endoscopy;  Laterality: N/A;   CYSTOSCOPY W/ RETROGRADES Bilateral 03/07/2020   Procedure: CYSTOSCOPY WITH RETROGRADE PYELOGRAM;  Surgeon: Billey Co, MD;  Location: [facility];  Service: Urology;  Laterality: Bilateral;   CYSTOSCOPY/URETEROSCOPY/HOLMIUM LASER/STENT PLACEMENT Bilateral 03/07/2020   Procedure: CYSTOSCOPY/URETEROSCOPY/HOLMIUM LASER/STENT PLACEMENT;  Surgeon: Billey Co, MD;   Location: [facility];  Service: Urology;  Laterality: Bilateral;   FIDUCIAL MARKER PLACEMENT  03/23/2021   Procedure: FIDUCIAL MARKER PLACEMENT;  Surgeon: Garner Nash, DO;  Location: [place] [facility];  Service: Pulmonary;;   JOINT REPLACEMENT     both knees   REPLACEMENT TOTAL KNEE BILATERAL     TONSILLECTOMY     VIDEO BRONCHOSCOPY WITH ENDOBRONCHIAL NAVIGATION Right 03/23/2021   Procedure: VIDEO BRONCHOSCOPY WITH ENDOBRONCHIAL NAVIGATION;  Surgeon: Garner Nash, DO;  Location: [place];  Service: Pulmonary;  Laterality: Right;  ION Case    VIDEO BRONCHOSCOPY WITH RADIAL ENDOBRONCHIAL ULTRASOUND  03/23/2021   Procedure: VIDEO BRONCHOSCOPY WITH RADIAL ENDOBRONCHIAL ULTRASOUND;  Surgeon: Garner Nash, DO;  Location: [place] [facility];  Service: Pulmonary;;   VIDEO BRONCHOSCOPY WITH RADIAL ENDOBRONCHIAL ULTRASOUND  09/14/2021  Procedure: RADIAL ENDOBRONCHIAL ULTRASOUND;  Surgeon: Garner Nash, DO;  Location: [place] [facility];  Service: Pulmonary;;    REVIEW OF SYSTEMS:   Review of Systems  Constitutional: Negative for appetite change, chills, fatigue, fever and unexpected weight change.  HENT:   Negative for mouth sores, nosebleeds, sore throat and trouble swallowing.   Eyes: Negative for eye problems and icterus.  Respiratory: Negative for cough, hemoptysis, shortness of breath and wheezing.   Cardiovascular: Negative for chest pain and leg swelling.  Gastrointestinal: Negative for abdominal pain, constipation, diarrhea, nausea and vomiting.  Genitourinary: Negative for bladder incontinence, difficulty urinating, dysuria, frequency and hematuria.   Musculoskeletal: Negative for back pain, gait problem, neck pain and neck stiffness.  Skin: Negative for itching and rash.  Neurological: Negative for dizziness, extremity weakness, gait problem, headaches, light-headedness and seizures.  Hematological: Negative for adenopathy. Does not bruise/bleed easily.  Psychiatric/Behavioral: Negative  for confusion, depression and sleep disturbance. The patient is not nervous/anxious.     PHYSICAL EXAMINATION:  Blood pressure (!) 150/72, pulse 95, temperature 97.8 F (36.6 C), temperature source Oral, resp. rate 16, height 5' 4"  (1.626 m), weight 186 lb (84.4 kg), SpO2 95 %.  ECOG PERFORMANCE STATUS: 0 - Asymptomatic  Physical Exam  Constitutional: Oriented to person, place, and time and well-developed, well-nourished, and in no distress.  HENT:  Head: Normocephalic and atraumatic.  Mouth/Throat: Oropharynx is clear and moist. No oropharyngeal exudate.  Eyes: Conjunctivae are normal. Right eye exhibits no discharge. Left eye exhibits no discharge. No scleral icterus.  Neck: Normal range of motion. Neck supple.  Cardiovascular: Normal rate, regular rhythm, normal heart sounds and intact distal pulses.   Pulmonary/Chest: Effort normal and breath sounds normal. No respiratory distress. No wheezes. No rales.  Abdominal: Soft. Bowel sounds are normal. Exhibits no distension and no mass. There is no tenderness.  Musculoskeletal: Normal range of motion. Exhibits no edema.  Lymphadenopathy:    No cervical adenopathy.  Neurological: Alert and oriented to person, place, and time. Exhibits normal muscle tone. Gait normal. Coordination normal.  Skin: Skin is warm and dry. No rash noted. Not diaphoretic. No erythema. No pallor.  Psychiatric: Mood, memory and judgment normal.  Vitals reviewed.  LABORATORY DATA: Lab Results  Component Value Date   WBC 9.2 02/02/2022   HGB 13.2 02/02/2022   HCT 40.7 02/02/2022   MCV 85.0 02/02/2022   PLT 370 02/02/2022      Chemistry      Component Value Date/Time   NA 140 02/02/2022 0900   NA 136 02/12/2013 0606   K 4.3 02/02/2022 0900   K 4.4 02/12/2013 0606   CL 102 02/02/2022 0900   CL 101 02/12/2013 0606   CO2 31 02/02/2022 0900   CO2 30 02/12/2013 0606   BUN 13 02/02/2022 0900   BUN 25 (H) 02/12/2013 0606   CREATININE 0.78 02/02/2022 0900    CREATININE 0.94 02/12/2013 0606      Component Value Date/Time   CALCIUM 9.6 02/02/2022 0900   CALCIUM 8.8 02/12/2013 0606   ALKPHOS 101 02/02/2022 0900   AST 18 02/02/2022 0900   ALT 17 02/02/2022 0900   BILITOT 0.7 02/02/2022 0900       RADIOGRAPHIC STUDIES:  Abdomen 1 view (KUB)  Result Date: 01/06/2022 CLINICAL DATA:  History of flank pain.  Evaluate for renal stones. EXAM: ABDOMEN - 1 VIEW COMPARISON:  PET-CT 11/02/2021 FINDINGS: Multiple punctate calcifications projecting within the expected location of the right kidney. Small punctate calcification projecting in  the expected location of the left kidney. Probable phleboliths within the pelvis. Lumbar spine degenerative changes. IMPRESSION: Findings most suggestive of right-greater-than-left nephrolithiasis. Electronically Signed   By: Lovey Newcomer M.D.   On: 01/06/2022 13:20   CT Chest W Contrast  Result Date: 01/27/2022 CLINICAL DATA:  Primary Cancer Type: Lung Imaging Indication: Assess response to therapy Interval therapy since last imaging? Yes Initial Cancer Diagnosis Date: 09/14/2021; Established by: Biopsy-proven Detailed Pathology: Non-small cell lung cancer, adenocarcinoma. Primary Tumor location: Right upper and lower lobe. Surgeries: Cholecystectomy 2017.  Hysterectomy, appendectomy. Chemotherapy: No Immunotherapy? No Radiation therapy? Yes; Date Range: 10/13/2021 - 10/23/2021; Target: Right lung * Tracking Code: BO * EXAM: CT CHEST WITH CONTRAST TECHNIQUE: Multidetector CT imaging of the chest was performed during intravenous contrast administration. RADIATION DOSE REDUCTION: This exam was performed according to the departmental dose-optimization program which includes automated exposure control, adjustment of the mA and/or kV according to patient size and/or use of iterative reconstruction technique. CONTRAST:  70m OMNIPAQUE IOHEXOL 300 MG/ML  SOLN COMPARISON:  11/02/2021 PET-CT.  Most recent CT chest 07/02/2021. FINDINGS:  Cardiovascular: Aortic atherosclerosis. Tortuous thoracic aorta. Mild cardiomegaly, without pericardial effusion. Left main and LAD coronary artery calcification. Pulmonary artery enlargement, outflow tract 3.1 cm. No central pulmonary embolism, on this non-dedicated study. Mediastinum/Nodes: Left-sided thyroid nodule was detailed on prior PET. No supraclavicular adenopathy. No mediastinal or hilar adenopathy. Lungs/Pleura: No pleural fluid. Radiation fiducials in the right apex. Lateral right apical ground-glass and septal thickening are new and presumably radiation induced. The right apical ground-glass nodule on the prior CT may again be identified at 1.3 cm on 17/5. Alternatively, this may simply represent lateral extension of the presumed radiation change. Airspace and ground-glass disease in the posterior right upper lobe, presumably obscuring the previously described 1.8 cm mixed attenuation nodule on 07/02/2021. This presumed radiation change extends into the superior segment right lower lobe. Scattered left-sided ground-glass nodules are similar, including the posterior left upper lobe at 7 mm on 33/5. Smaller lesions identified within the superior segment left lower lobe at 4 mm on 47/5 and anterior left upper lobe at 6 mm on 54/5. Subpleural right upper lobe 3 mm pulmonary nodule is unchanged on 62/5. The 7 mm right lower lobe ground-glass nodule is not significantly changed on 81/5. Upper Abdomen: Cholecystectomy. Minimal motion degradation. Normal imaged portions of the liver, spleen, stomach, pancreas, kidneys. Chronic bilateral adrenal thickening and nodularity are unchanged. Musculoskeletal: Accentuation of expected thoracic kyphosis. Lower thoracic spondylosis. IMPRESSION: 1. Multifocal right-sided radiation change. This presumably obscures the posterior right upper lobe lesion and may obscure the right apical lesion. Other smaller ground-glass nodules are not significantly changed and may represent  multifocal adenocarcinoma. 2. No thoracic adenopathy. 3. Coronary artery atherosclerosis. Aortic Atherosclerosis (ICD10-I70.0). 4. Pulmonary artery enlargement suggests pulmonary arterial hypertension. Electronically Signed   By: KAbigail MiyamotoM.D.   On: 01/27/2022 10:34   MR CERVICAL SPINE W WO CONTRAST  Result Date: 02/03/2022 CLINICAL DATA:  Lung cancer, possible cervical cord lesion (versus artifact) at the C4 vertical level, for further investigation. EXAM: MRI CERVICAL SPINE WITHOUT AND WITH CONTRAST TECHNIQUE: Multiplanar and multiecho pulse sequences of the cervical spine, to include the craniocervical junction and cervicothoracic junction, were obtained without and with intravenous contrast. CONTRAST:  858mGADAVIST GADOBUTROL 1 MMOL/ML IV SOLN COMPARISON:  Brain MRI 11/02/2021 FINDINGS: Alignment: No vertebral subluxation is observed. Vertebrae: Posteriorly in the central T3 vertebral body there a 5 mm focus of enhancement which demonstrates high T2 and  low T1 signal characteristics. This corresponds with a notch in the characteristic location for basivertebral vein shown unchanged back through 09/25/2020, and accordingly is thought to simply represent a prominent basivertebral vein. Intervertebral disc desiccation is observed at all levels in the cervical spine with loss of disc height at C5-6 and C6-7. Type 2 degenerative endplate findings noted at C5-6 with lesser degenerative endplate findings at O8-4. No specific worrisome bony lesion for metastatic disease is identified. Cord: The cord signal specifically at the C4 vertebral level, and elsewhere in the cervical spine, appears normal. There is no abnormal enhancement or underlying lesion visible at C4. Accordingly, the finding on image 13 of series 18 on the prior MRI brain is compatible with artifact. Posterior Fossa, vertebral arteries, paraspinal tissues: Unremarkable Disc levels: C2-3: No impingement.  Mild uncinate spurring. C3-4: Moderate left  and borderline right foraminal stenosis and mild central narrowing of the thecal sac due to disc bulge, uncinate spurring, and facet arthropathy. C4-5: Mild left and borderline right foraminal stenosis along with mild central narrowing of the thecal sac due to disc bulge and uncinate spurring. C5-6: Moderate to prominent bilateral foraminal stenosis and mild right eccentric central narrowing of the thecal sac due to disc osteophyte complex and facet arthropathy. C6-7: Borderline bilateral foraminal stenosis due to uncinate spurring. C7-T1: No impingement. Degenerative left facet arthropathy. Left perineural cyst. T1-2: No impingement.  Small bilateral perineural cysts. IMPRESSION: 1. There is no cervical cord lesion at C4 or elsewhere. The finding on the prior brain MRI is ascribed artifact. 2. Cervical spondylosis and degenerative disc disease causing moderate to prominent impingement at C5-6; moderate impingement at C3-4; and mild impingement at C4-5, as detailed above. 3. Small focus of enhancement and accentuated T2 signal in the posterior midline T3 vertebral body corresponds to the characteristic location for the basivertebral vein, and this location is demonstrated exaggerated notching on prior CT chest exams back through 09/25/2020 further suggesting that this is simply a prominent basivertebral vein. Electronically Signed   By: Van Clines M.D.   On: 02/03/2022 08:35     ASSESSMENT/PLAN:  This is a very pleasant 71 year old Caucasian female with multifocal adenocarcinoma in the right and left lung.  She has biopsy confirmed right upper lobe adenocarcinoma.  She was diagnosed in January 2023.  Molecular studies by [facility] showed positive EGFR mutation.  Her PD-L1 expression 0%.  The patient completed SBRT to the right upper lobe lung nodule under the care of Dr. Lisbeth Renshaw which was completed in February 2023.  The patient recently had a restaging CT scan performed.  Dr. Julien Nordmann  personally independently reviewed the scan discussed results with the patient today.  The scan did not show any evidence of disease progression. The indeterminate area on staging brain MRI is likely artifact.   Dr. Julien Nordmann recommends that the patient continue on observation with a restaging CT scan in 4 months.  If the patient ever has evidence of disease progression in the future, Dr. Julien Nordmann would likely recommend treatment with targeted treatment with Tagrisso.  The patient was advised to call immediately if she has any concerning symptoms in the interval. The patient voices understanding of current disease status and treatment options and is in agreement with the current care plan. All questions were answered. The patient knows to call the clinic with any problems, questions or concerns. We can certainly see the patient much sooner if necessary   Orders Placed This Encounter  Procedures   CT Chest W Contrast  Standing Status:   Future    Standing Expiration Date:   02/03/2023    Order Specific Question:   If indicated for the ordered procedure, I authorize the administration of contrast media per Radiology protocol    Answer:   Yes    Order Specific Question:   Preferred imaging location?    Answer:   [facility]   CBC with Differential ([facility] Only)    Standing Status:   Future    Standing Expiration Date:   02/04/2023   CMP ([facility] only)    Standing Status:   Future    Standing Expiration Date:   02/04/2023      Dorothy Sos Nasri Boakye, PA-C 02/03/22  ADDENDUM: Hematology/Oncology Attending: I had a face-to-face encounter with the patient today.  I reviewed her records, lab, scan and recommended her care plan.  This is a very pleasant 71 years old white female with multifocal adenocarcinoma in the right and left lung diagnosed in January 2023 status post SBRT to the right upper lobe lung nodule under the care of Dr. Lisbeth Renshaw completed in February 2023.  The  patient had positive EGFR mutation and negative PD-L1 expression. She is currently on observation and feeling fine. She had repeat CT scan of the chest performed recently.  I personally and independently reviewed the scan images and discussed the result with the patient and her daughter. Her scan showed multifocal right-sided radiation changes obscuring the posterior right upper lobe lesion and may obscure right upper apical lesion with no significant change in the other smaller groundglass nodules representing multifocal adenocarcinoma and no thoracic adenopathy. The patient also had MRI of the cervical spine that was unremarkable. I recommended for the patient to continue on observation with repeat CT scan of the chest in 4 months but we will be happy to see the patient sooner if there is any concerning complaints. The patient was advised to call immediately if she has any other concerning symptoms in the interval.

## 2022-02-02 ENCOUNTER — Ambulatory Visit (HOSPITAL_COMMUNITY)
Admission: RE | Admit: 2022-02-02 | Discharge: 2022-02-02 | Disposition: A | Discharge status: Home / Self Care | Payer: Medicare HMO | Source: Ambulatory Visit | Attending: Internal Medicine | Admitting: Internal Medicine

## 2022-02-02 ENCOUNTER — Inpatient Hospital Stay: Payer: Medicare HMO | Attending: Internal Medicine

## 2022-02-02 DIAGNOSIS — C3492 Malignant neoplasm of unspecified part of left bronchus or lung: Secondary | ICD-10-CM | POA: Diagnosis not present

## 2022-02-02 DIAGNOSIS — M50321 Other cervical disc degeneration at C4-C5 level: Secondary | ICD-10-CM | POA: Diagnosis not present

## 2022-02-02 DIAGNOSIS — Z90710 Acquired absence of both cervix and uterus: Secondary | ICD-10-CM | POA: Insufficient documentation

## 2022-02-02 DIAGNOSIS — I110 Hypertensive heart disease with heart failure: Secondary | ICD-10-CM | POA: Insufficient documentation

## 2022-02-02 DIAGNOSIS — I509 Heart failure, unspecified: Secondary | ICD-10-CM | POA: Insufficient documentation

## 2022-02-02 DIAGNOSIS — M47812 Spondylosis without myelopathy or radiculopathy, cervical region: Secondary | ICD-10-CM | POA: Diagnosis not present

## 2022-02-02 DIAGNOSIS — C3411 Malignant neoplasm of upper lobe, right bronchus or lung: Secondary | ICD-10-CM | POA: Diagnosis not present

## 2022-02-02 DIAGNOSIS — E119 Type 2 diabetes mellitus without complications: Secondary | ICD-10-CM | POA: Diagnosis not present

## 2022-02-02 LAB — CBC WITH DIFFERENTIAL (CANCER CENTER ONLY)
Abs Immature Granulocytes: 0.04 10*3/uL (ref 0.00–0.07)
Basophils Absolute: 0.1 10*3/uL (ref 0.0–0.1)
Basophils Relative: 1 %
Eosinophils Absolute: 0.1 10*3/uL (ref 0.0–0.5)
Eosinophils Relative: 1 %
HCT: 40.7 % (ref 36.0–46.0)
Hemoglobin: 13.2 g/dL (ref 12.0–15.0)
Immature Granulocytes: 0 %
Lymphocytes Relative: 17 %
Lymphs Abs: 1.6 10*3/uL (ref 0.7–4.0)
MCH: 27.6 pg (ref 26.0–34.0)
MCHC: 32.4 g/dL (ref 30.0–36.0)
MCV: 85 fL (ref 80.0–100.0)
Monocytes Absolute: 0.7 10*3/uL (ref 0.1–1.0)
Monocytes Relative: 8 %
Neutro Abs: 6.7 10*3/uL (ref 1.7–7.7)
Neutrophils Relative %: 73 %
Platelet Count: 370 10*3/uL (ref 150–400)
RBC: 4.79 MIL/uL (ref 3.87–5.11)
RDW: 14.9 % (ref 11.5–15.5)
WBC Count: 9.2 10*3/uL (ref 4.0–10.5)
nRBC: 0 % (ref 0.0–0.2)

## 2022-02-02 LAB — CMP (CANCER CENTER ONLY)
ALT: 17 U/L (ref 0–44)
AST: 18 U/L (ref 15–41)
Albumin: 4.1 g/dL (ref 3.5–5.0)
Alkaline Phosphatase: 101 U/L (ref 38–126)
Anion gap: 7 (ref 5–15)
BUN: 13 mg/dL (ref 8–23)
CO2: 31 mmol/L (ref 22–32)
Calcium: 9.6 mg/dL (ref 8.9–10.3)
Chloride: 102 mmol/L (ref 98–111)
Creatinine: 0.78 mg/dL (ref 0.44–1.00)
GFR, Estimated: >60 mL/min (ref >60)
Glucose, Bld: 107 mg/dL — ABNORMAL HIGH (ref 70–99)
Potassium: 4.3 mmol/L (ref 3.5–5.1)
Sodium: 140 mmol/L (ref 135–145)
Total Bilirubin: 0.7 mg/dL (ref 0.3–1.2)
Total Protein: 7.8 g/dL (ref 6.5–8.1)

## 2022-02-02 IMAGING — MR MR CERVICAL SPINE WO/W CM
7 of 8 series · 33 of 48 positions shown · IV contrast (gadavist)
Comparison: Brain MRI [DATE]

CLINICAL DATA: Lung cancer, possible cervical cord lesion (versus
artifact) at the C4 vertical level, for further investigation.

EXAM:
MRI CERVICAL SPINE WITHOUT AND WITH CONTRAST
TECHNIQUE: Multiplanar and multiecho pulse sequences of the cervical spine, to
include the craniocervical junction and cervicothoracic junction,
were obtained without and with intravenous contrast.
CONTRAST:  8mL GADAVIST GADOBUTROL 1 MMOL/ML IV SOLN

[Series 5: T1 · sagittal · 3.0mm · 0.69mm/px · 4 of 15 slices shown (1 of 2)]
[im 1/15]
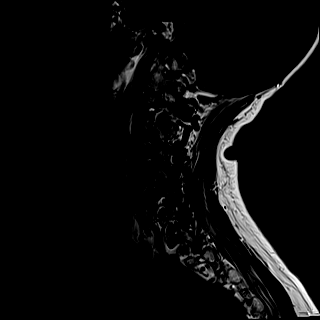
[im 5/15]
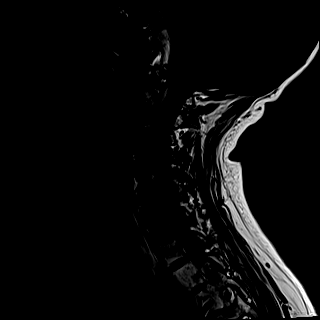
[im 10/15]
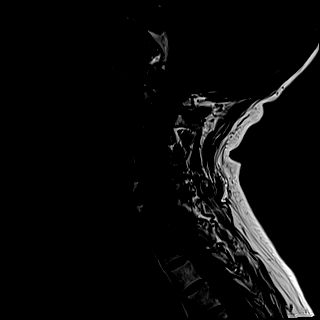
[im 15/15]
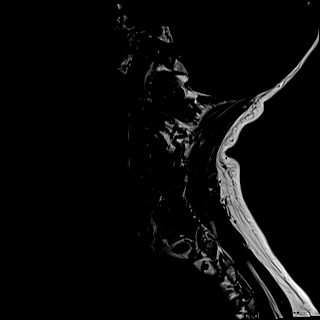

[Series 6: STIR · sagittal · 3.0mm · 0.86mm/px · 4 of 15 slices shown]
[im 1/15]
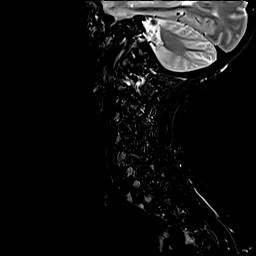
[im 5/15]
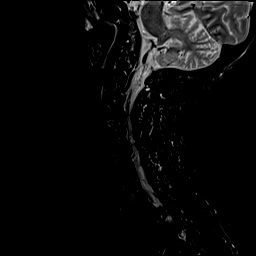
[im 10/15]
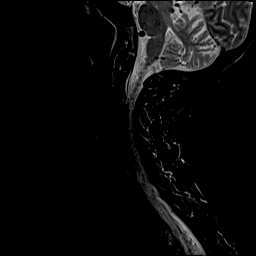
[im 15/15]
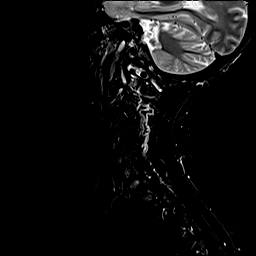

[Series 7: T2 · axial · 3.0mm · 0.70mm/px · z∈[-48,+48]mm · 8 of 29 slices shown]
[im 1/29]
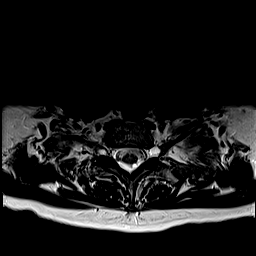
[im 5/29]
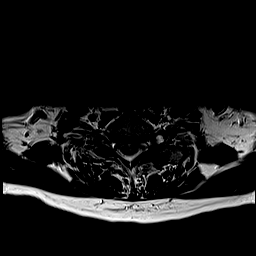
[im 9/29]
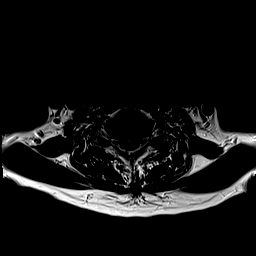
[im 13/29]
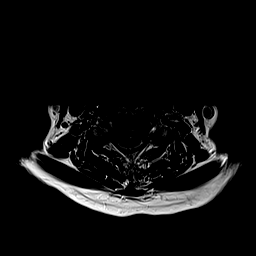
[im 17/29]
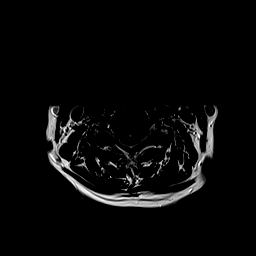
[im 21/29]
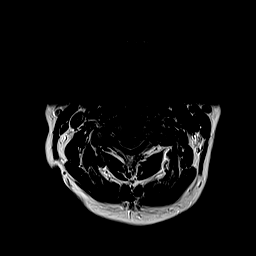
[im 25/29]
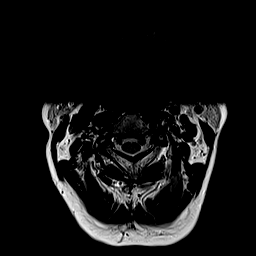
[im 29/29]
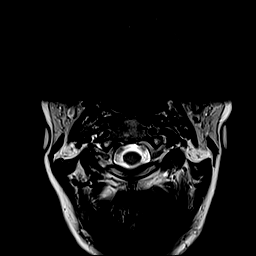

[Series 9: T1 · axial · 3.0mm · 0.35mm/px · z∈[-48,+48]mm · 8 of 29 slices shown (2 of 2)]
[im 1/29]
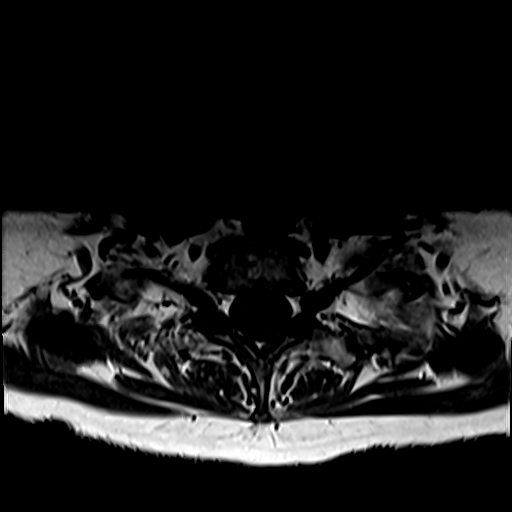
[im 5/29]
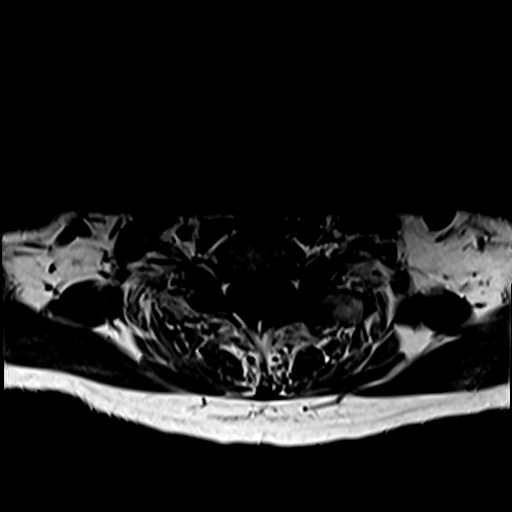
[im 9/29]
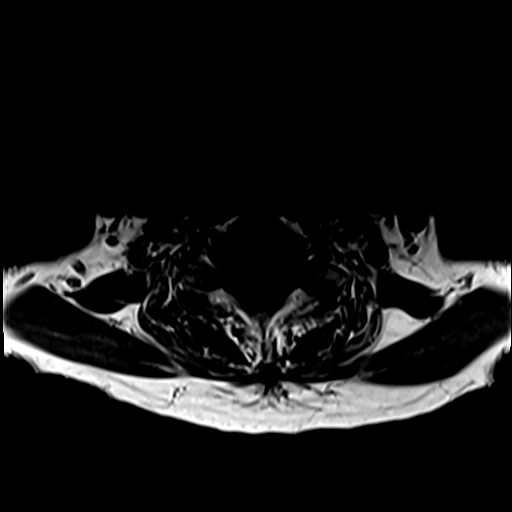
[im 13/29]
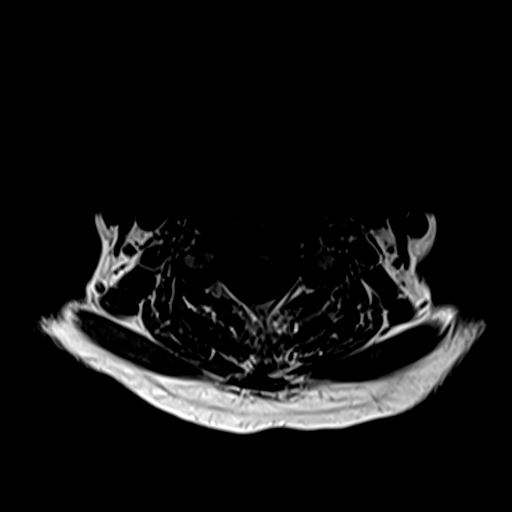
[im 17/29]
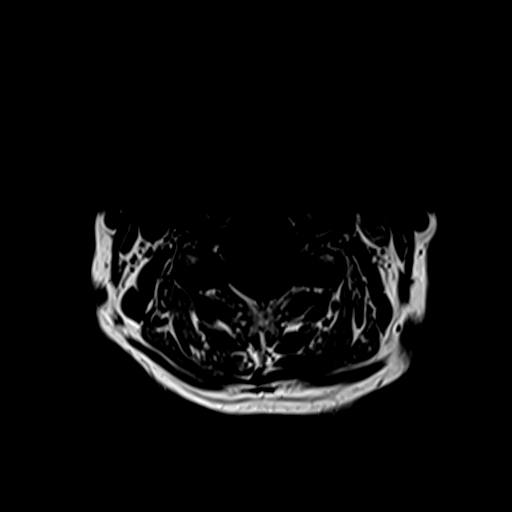
[im 21/29]
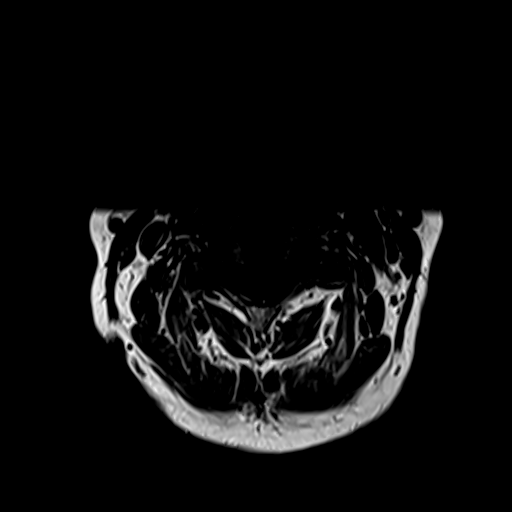
[im 25/29]
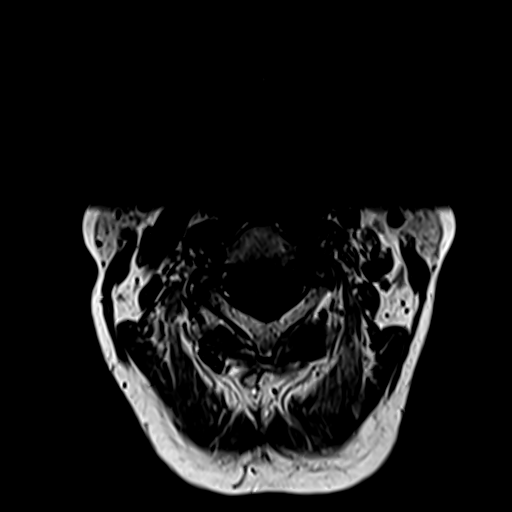
[im 29/29]
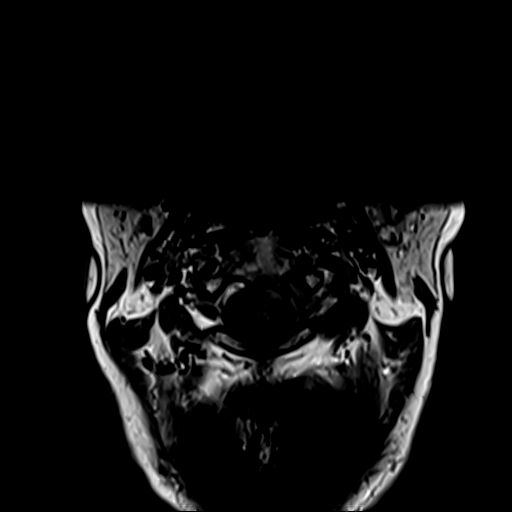

[Series 10: T2 post-contrast · sagittal · 3.0mm · 0.69mm/px · 4 of 15 slices shown]
[im 1/15]
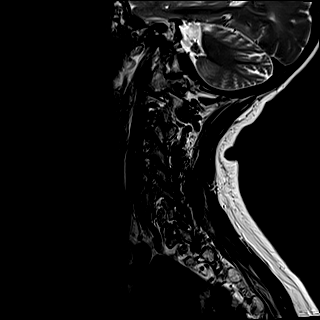
[im 5/15]
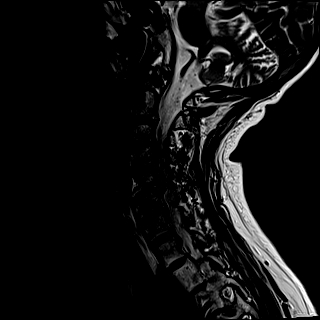
[im 10/15]
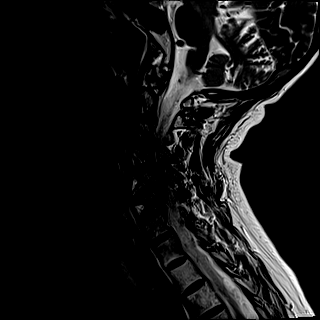
[im 15/15]
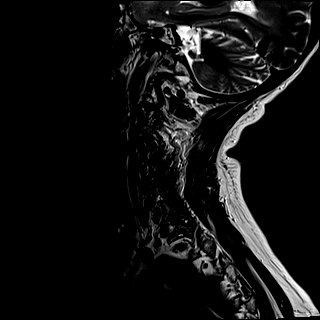

[Series 11: T1 fat-sat post-contrast · sagittal · 3.0mm · 0.69mm/px · 4 of 15 slices shown]
[im 1/15]
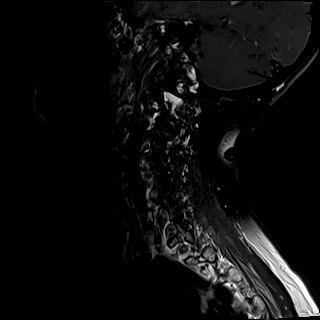
[im 5/15]
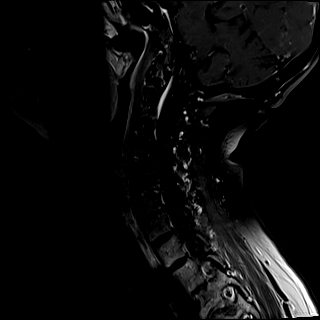
[im 10/15]
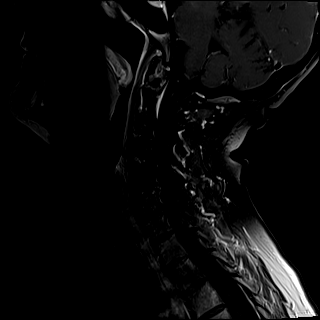
[im 15/15]
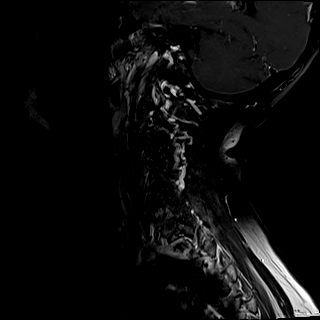

[Series 12: T1 post-contrast · axial · 3.0mm · 0.35mm/px · 1 of 29 slices shown]
[im 1/29]
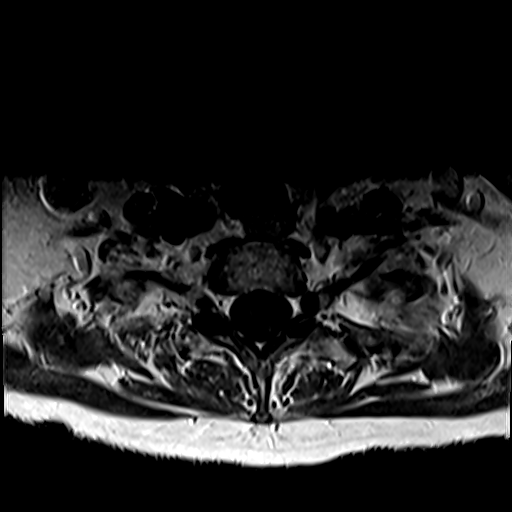

[33 of 48 positions shown; findings below may reference images not displayed]

FINDINGS: Alignment: No vertebral subluxation is observed.

Vertebrae: Posteriorly in the central T3 vertebral body there a 5 mm
focus of enhancement which demonstrates high T2 and low T1 signal
characteristics. This corresponds with a notch in the characteristic
location for basivertebral vein shown unchanged back through
[DATE], and accordingly is thought to simply represent a
prominent basivertebral vein.

Intervertebral disc desiccation is observed at all levels in the
cervical spine with loss of disc height at C5-6 and C6-7. Type 2
degenerative endplate findings noted at C5-6 with lesser
degenerative endplate findings at C6-7. No specific worrisome bony
lesion for metastatic disease is identified.

Cord: The cord signal specifically at the C4 vertebral level, and
elsewhere in the cervical spine, appears normal. There is no
abnormal enhancement or underlying lesion visible at C4.
Accordingly, the finding on image 13 of series 18 on the prior MRI
brain is compatible with artifact.

Posterior Fossa, vertebral arteries, paraspinal tissues:
Unremarkable

Disc levels:

C2-3: No impingement.  Mild uncinate spurring.

C3-4: Moderate left and borderline right foraminal stenosis and mild
central narrowing of the thecal sac due to disc bulge, uncinate
spurring, and facet arthropathy.

C4-5: Mild left and borderline right foraminal stenosis along with
mild central narrowing of the thecal sac due to disc bulge and
uncinate spurring.

C5-6: Moderate to prominent bilateral foraminal stenosis and mild
right eccentric central narrowing of the thecal sac due to disc
osteophyte complex and facet arthropathy.

C6-7: Borderline bilateral foraminal stenosis due to uncinate
spurring.

C7-T1: No impingement. Degenerative left facet arthropathy. Left
perineural cyst.

T1-2: No impingement.  Small bilateral perineural cysts.
IMPRESSION: 1. There is no cervical cord lesion at C4 or elsewhere. The finding
on the prior brain MRI is ascribed artifact.
2. Cervical spondylosis and degenerative disc disease causing
moderate to prominent impingement at C5-6; moderate impingement at
C3-4; and mild impingement at C4-5, as detailed above.
3. Small focus of enhancement and accentuated T2 signal in the
posterior midline T3 vertebral body corresponds to the
characteristic location for the basivertebral vein, and this
location is demonstrated exaggerated notching on prior CT chest
exams back through [DATE] further suggesting that this is simply
a prominent basivertebral vein.

## 2022-02-02 MED ORDER — GADOBUTROL 1 MMOL/ML IV SOLN
8.0000 mL | Freq: Once | INTRAVENOUS | Status: AC | PRN
  Administered 2022-02-02: 8 mL via INTRAVENOUS

## 2022-02-03 ENCOUNTER — Inpatient Hospital Stay: Payer: Medicare HMO | Admitting: Physician Assistant

## 2022-02-03 VITALS — BP 150/72 | HR 95 | Temp 97.8°F | Resp 16 | Ht 64.0 in | Wt 186.0 lb

## 2022-02-03 DIAGNOSIS — C3411 Malignant neoplasm of upper lobe, right bronchus or lung: Secondary | ICD-10-CM | POA: Diagnosis not present

## 2022-02-03 DIAGNOSIS — R918 Other nonspecific abnormal finding of lung field: Secondary | ICD-10-CM | POA: Diagnosis not present

## 2022-02-04 ENCOUNTER — Ambulatory Visit: Payer: Medicare HMO | Admitting: Physician Assistant

## 2022-02-04 ENCOUNTER — Ambulatory Visit: Payer: Medicare HMO | Admitting: Internal Medicine

## 2022-02-04 ENCOUNTER — Other Ambulatory Visit: Payer: Self-pay | Admitting: Physician Assistant

## 2022-02-22 ENCOUNTER — Encounter: Payer: Self-pay | Admitting: *Deleted

## 2022-05-14 ENCOUNTER — Ambulatory Visit: Payer: Medicare HMO | Admitting: Internal Medicine

## 2022-05-14 ENCOUNTER — Encounter: Payer: Self-pay | Admitting: Internal Medicine

## 2022-05-14 VITALS — BP 130/72 | HR 61 | Ht 64.0 in | Wt 182.0 lb

## 2022-05-14 DIAGNOSIS — E042 Nontoxic multinodular goiter: Secondary | ICD-10-CM

## 2022-05-14 DIAGNOSIS — R7989 Other specified abnormal findings of blood chemistry: Secondary | ICD-10-CM

## 2022-05-14 DIAGNOSIS — D3500 Benign neoplasm of unspecified adrenal gland: Secondary | ICD-10-CM | POA: Diagnosis not present

## 2022-05-14 NOTE — Progress Notes (Unsigned)
Name: Dorothy Gross  MRN/ DOB: [id number], Dec 29, 1950    Age/ Sex: 71 y.o., female     PCP: [place] Pink, MD   Reason for Endocrinology Evaluation: Adrenal adenoma      Initial Endocrinology Clinic Visit: 05/26/2020    PATIENT IDENTIFIER: Dorothy Gross is a 70 y.o., female with a past medical history of A. Fib, CHF, HTN and nephrolithiasis. She has followed with [facility] since 05/26/2020  for consultative assistance with management of her adrenal adenoma  HISTORICAL SUMMARY:   Pt had an incidental finding of bilateral adrenal nodularity bilaterally L>R, with a HFU of 40 and 95 post-contrast on CT scan in 12/2019 during painless hematuria workup.   Her Aldo , renin , urinary free cortisol were all normal. Metanephrines were slightly elevated at less then 2x the upper limit of normal, considered not clinically significant in 2021.  Patient was noted with elevated 24-hour urinary cortisol at 57.1 mcg(reference 50) in April 2023 Her dexamethasone suppression test also came back elevated at 2.9 (reference 1.8)      NO FH of adrenal gland issues, has strong FH of HTN NO FH of renal stones     THYROID HISTORY: She was noted with left thyroid nodule on exam in 04/2020, this was confirmed by a thyroid ultrasound revealing a left inferior 2.8 cm meeting FNA criteria.  She is S/P FNA of the left thyroid nodule on 06/2020 with benign cytology   SUBJECTIVE:    Today (05/14/2022):  Dorothy Gross is here for adrenal adenoma. 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   She is S/P radiation treatment completed February 2023  She has been noted with weight loss - appetite is good    She goes to be by 11 pm  No steroid or intra-articular injection   Adrenal incidentaloma: Substantial weight  gain- no Centripetal obesity- no Severe hypertension- no Proximal muscle weakness- no Sudden/ severe headaches- no Anxiety attacks- no Sweating- no Cardiac arrhythmias- A. 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   Cardiomyopathy (HCC)    CHF (congestive heart failure) (HCC)    Diabetes mellitus without complication ([place])    History of kidney stones    Hyperlipidemia    Hypertension    Lung cancer ([facility]) 09/14/2021   Past Surgical History:  Past Surgical History:  Procedure Laterality Date   ABDOMINAL HYSTERECTOMY     APPENDECTOMY     BREAST EXCISIONAL BIOPSY Right [id number]   negative 1974 negative 2015   BRONCHIAL BIOPSY  03/23/2021   Procedure: BRONCHIAL BIOPSIES;  Surgeon: Garner Nash, DO;  Location: [facility];  Service: Pulmonary;;   BRONCHIAL BIOPSY  09/14/2021   Procedure: BRONCHIAL BIOPSIES;  Surgeon: Garner Nash, DO;  Location: [facility];  Service: Pulmonary;;   BRONCHIAL BRUSHINGS  03/23/2021   Procedure: BRONCHIAL BRUSHINGS;  Surgeon: Garner Nash, DO;  Location: [place];  Service: Pulmonary;;   BRONCHIAL BRUSHINGS  09/14/2021  Procedure: BRONCHIAL BRUSHINGS;  Surgeon: Garner Nash, DO;  Location: [place] [facility];  Service: Pulmonary;;   BRONCHIAL NEEDLE ASPIRATION BIOPSY  09/14/2021   Procedure: BRONCHIAL NEEDLE ASPIRATION BIOPSIES;  Surgeon: Garner Nash, DO;  Location: [facility] [facility];  Service: Pulmonary;;   BRONCHIAL WASHINGS  03/23/2021   Procedure: BRONCHIAL WASHINGS;  Surgeon: Garner Nash, DO;  Location: [facility];  Service: Pulmonary;;   CHOLECYSTECTOMY N/A 12/05/2015   Procedure: LAPAROSCOPIC CHOLECYSTECTOMY WITH  INTRAOPERATIVE CHOLANGIOGRAM;  Surgeon: Leonie Green, MD;  Location: [facility];  Service: General;  Laterality: N/A;   COLONOSCOPY WITH PROPOFOL N/A 10/18/2016   Procedure: COLONOSCOPY WITH PROPOFOL;  Surgeon: Manya Silvas, MD;  Location: [facility];  Service: Endoscopy;  Laterality: N/A;   COLONOSCOPY WITH PROPOFOL N/A 03/17/2018   Procedure: COLONOSCOPY WITH PROPOFOL;  Surgeon: Manya Silvas, MD;  Location: [facility];  Service: Endoscopy;  Laterality: N/A;   CYSTOSCOPY W/ RETROGRADES Bilateral 03/07/2020   Procedure: CYSTOSCOPY WITH RETROGRADE PYELOGRAM;  Surgeon: Billey Co, MD;  Location: [facility];  Service: Urology;  Laterality: Bilateral;   CYSTOSCOPY/URETEROSCOPY/HOLMIUM LASER/STENT PLACEMENT Bilateral 03/07/2020   Procedure: CYSTOSCOPY/URETEROSCOPY/HOLMIUM LASER/STENT PLACEMENT;  Surgeon: Billey Co, MD;  Location: [facility];  Service: Urology;  Laterality: Bilateral;   FIDUCIAL MARKER PLACEMENT  03/23/2021   Procedure: FIDUCIAL MARKER PLACEMENT;  Surgeon: Garner Nash, DO;  Location: [facility];  Service: Pulmonary;;   JOINT REPLACEMENT     both knees   REPLACEMENT TOTAL KNEE BILATERAL     TONSILLECTOMY     VIDEO BRONCHOSCOPY WITH ENDOBRONCHIAL NAVIGATION Right 03/23/2021   Procedure: VIDEO BRONCHOSCOPY WITH ENDOBRONCHIAL NAVIGATION;  Surgeon: Garner Nash, DO;  Location: [place];  Service: Pulmonary;  Laterality: Right;  ION Case    VIDEO BRONCHOSCOPY WITH RADIAL ENDOBRONCHIAL ULTRASOUND  03/23/2021   Procedure: VIDEO BRONCHOSCOPY WITH RADIAL ENDOBRONCHIAL ULTRASOUND;  Surgeon: Garner Nash, DO;  Location: [place] [facility];  Service: Pulmonary;;   VIDEO BRONCHOSCOPY WITH RADIAL ENDOBRONCHIAL ULTRASOUND  09/14/2021   Procedure: RADIAL ENDOBRONCHIAL ULTRASOUND;  Surgeon: Garner Nash, DO;  Location: [facility] [facility];  Service: Pulmonary;;   Social History:  reports that she quit smoking about 14 years ago. Her smoking use included cigarettes. She  has a 3.75 pack-year smoking history. She has never used smokeless tobacco. She reports that she does not drink alcohol and does not use drugs. Family History:  Family History  Problem Relation Age of Onset   Diabetes Mother    Diabetes Sister    Diabetes Maternal Aunt    Cancer Maternal Uncle    Cancer Maternal Uncle    Cancer Maternal Uncle    Cancer Maternal Uncle    Cancer Maternal Uncle    Cancer Maternal Uncle    Cancer Maternal Uncle    Cancer Maternal Uncle    Cancer Maternal Uncle    Breast cancer Neg Hx      HOME MEDICATIONS: Allergies as of 05/14/2022   No Known Allergies      Medication List        Accurate as of May 14, 2022  8:03 AM. If you have any questions, ask your nurse or doctor.          acetaminophen 500 MG tablet Commonly known as: TYLENOL Take 1,000 mg by mouth every 6 (six) hours as needed.   amiodarone 200 MG tablet Commonly known as: PACERONE Take 200 mg by mouth daily.   apixaban 5 MG Tabs tablet Commonly known as: ELIQUIS Take  5 mg by mouth 2 (two) times daily.   BIOTIN PO Take 1 capsule by mouth daily.   calcium carbonate 1250 (500 Ca) MG tablet Commonly known as: OS-CAL - dosed in mg of elemental calcium Take 1 tablet by mouth daily with breakfast.   carvedilol 6.25 MG tablet Commonly known as: COREG Take 6.25 mg by mouth 2 (two) times daily with a meal.   cyanocobalamin 1000 MCG tablet Take 1,000 mcg by mouth daily.   digoxin 0.125 MG tablet Commonly known as: LANOXIN Take 0.125 mg by mouth daily.   ferrous sulfate 325 (65 FE) MG tablet Take 325 mg by mouth every other day.   FLAX SEED OIL PO Take 2 capsules by mouth daily.   furosemide 40 MG tablet Commonly known as: LASIX Take 40 mg by mouth daily.   Lancets Misc   losartan 25 MG tablet Commonly known as: COZAAR Take 50 mg by mouth daily.   losartan 25 MG tablet Commonly known as: COZAAR Take 2 tablets by mouth daily.   metFORMIN 500 MG 24 hr  tablet Commonly known as: GLUCOPHAGE-XR Take 1,000 mg by mouth daily with supper.   MULTIVITAMIN ADULT PO Take 1 tablet by mouth daily.   OneTouch Ultra test strip Generic drug: glucose blood USE 1 EACH (1 STRIP TOTAL) 3 (THREE) TIMES DAILY   rosuvastatin 10 MG tablet Commonly known as: CRESTOR Take 10 mg by mouth daily.   sodium chloride 0.65 % Soln nasal spray Commonly known as: OCEAN Place 1 spray into both nostrils daily as needed (Dry nose).   SYSTANE OP Place 1 drop into both eyes daily as needed (dry eyes).   Vitamin D3 25 MCG (1000 UT) Caps Take 1,000 Units by mouth daily.   zinc gluconate 50 MG tablet Take 50 mg by mouth daily.          OBJECTIVE:   PHYSICAL EXAM: VS: BP 130/72 (BP Location: Left Arm, Patient Position: Sitting, Cuff Size: Small)   Pulse 61   Ht 5\' 4"  (1.626 m)   Wt 182 lb (82.6 kg)   SpO2 95%   BMI 31.24 kg/m    EXAM: General: Pt appears well and is in NAD  Thyroid : Left thyroid nodule appreciated   Lungs: Clear with good BS bilat with no rales, rhonchi, or wheezes  Heart: Auscultation: RRR.  Abdomen: Normoactive bowel sounds, soft, nontender, without masses or organomegaly palpable  Extremities:  BL LE: No pretibial edema normal ROM and strength.  Mental Status: Judgment, insight: Intact Orientation: Oriented to time, place, and person Mood and affect: No depression, anxiety, or agitation     DATA REVIEWED:    Thyroid Ultrasound 12/03/2021  Estimated total number of nodules >/= 1 cm: 2   Number of spongiform nodules >/=  2 cm not described below (TR1): 0   Number of mixed cystic and solid nodules >/= 1.5 cm not described below ([place]): 0   _________________________________________________________   Nodule 1: 0.8 x 0.6 x 0.6 cm spongiform nodule in the superior right thyroid lobe does not meet criteria for imaging surveillance or FNA.   _________________________________________________________   Nodule # 2:    Prior biopsy: No   Location: Right; inferior   Maximum size: 1.2 cm; Other 2 dimensions: 0.7 x 0.8 cm, previously, 1.1 x 0.8 x 0.8 cm   Composition: solid/almost completely solid (2)   Echogenicity: isoechoic (1)   Shape: not taller-than-wide (0)   Margins: ill-defined (0)   Echogenic foci: none (0)  ACR TI-RADS total points: 3.   ACR TI-RADS risk category:  TR3 (3 points).   Significant change in size (>/= 20% in two dimensions and minimal increase of 2 mm): No   Change in features: No   Change in ACR TI-RADS risk category: No   ACR TI-RADS recommendations:   Given size (<1.4 cm) and appearance, this nodule does NOT meet TI-RADS criteria for biopsy or dedicated follow-up.   _________________________________________________________   Nodule 3: Previously biopsy solid isoechoic left thyroid nodule measuring 3.4 x 2.0 x 2.6 cm is not significantly changed in size from prior examination where it measured 2.8 x 2.2 x 2.4 cm. Please correlate with FNA results from 07/18/2020.   IMPRESSION: 1. Previously biopsied left inferior thyroid nodule is unchanged in size. Please correlate with FNA results from 07/18/2020. 2. Right thyroid nodules do not meet criteria for FNA or imaging surveillance.    FNA of the left  thyroid Nodule 07/18/2020  DIAGNOSIS:  A. THYROID GLAND, LEFT LOBE; ULTRASOUND-GUIDED FINE-NEEDLE ASPIRATION:  - BENIGN (BETHESDA CATEGORY II).   PET scan 11/02/2021 NECK: Left thyroid nodules including a 1.6 cm hypodense nodule and a 1.5 cm hypodense nodule, with metabolic activity similar to the rest of the thyroid gland.   Incidental CT findings: Bilateral common carotid atherosclerotic calcification.   CHEST: Fiducial near the dominant right lower lobe nodule sitting just anterior to the major fissure. This sub solid nodule measures 1.3 by 1.0 cm on image 64 series 4 has a maximum SUV of 2.1.   The subpleural 1.5 by 0.9 cm nodule at the right  lung apex has a maximum SUV of 1.7. The remaining sub solid nodules are too small to characterize.   Low-grade activity along the hazy bilateral nonfocal subpleural reticulation primarily in the lower lobes.   Incidental CT findings: Coronary, aortic arch, and branch vessel atherosclerotic vascular disease. Mild cardiomegaly.   ABDOMEN/PELVIS: Lobulated left adrenal mass 2.4 by 1.5 cm on image 103 of series 4, previously 2.6 by 1.6 cm back on 01/09/2020, with only low-grade metabolic activity, maximum SUV 3.5 (the contralateral morphologically normal appearing right adrenal gland has a maximum SUV of 3.7). This is probably an adenoma based on prior absolute washout of 67%. Current noncontrast density measurements are nonspecific.   Widespread accentuated bowel activity is likely physiologic.   Incidental CT findings: Cholecystectomy. Nonobstructive nephrolithiasis on the right. Residual contrast in the renal collecting systems and urinary bladder. Atherosclerosis is present, including aortoiliac atherosclerotic disease.   SKELETON: No significant abnormal hypermetabolic activity in this region.   Incidental CT findings: Mild levoconvex lumbar scoliosis.   IMPRESSION: 1. The dominant subsolid right lower lobe nodule just anterior to the major fissure has a maximum SUV of 2.1, compatible with low-grade adenocarcinoma based on positive biopsy result. The subpleural 1.5 by 0.9 cm nodule at the right lung apex has a maximum SUV of 1.7. The other sub solid pulmonary nodules are too small to characterize. 2. Low-grade activity along hazy bilateral nonfocal subpleural reticulations in the lower lobes, likely inflammatory. 3. Lobulated left adrenal mass is not hypermetabolic compared to the contralateral morphologically normal right adrenal gland, and is probably an adenoma based on current and prior imaging. 4. Left thyroid nodules including a 1.6 cm nodule. These were worked up on  prior thyroid ultrasound of 06/25/2020 and biopsy of 07/18/2020, which was benign. This has been evaluated on previous imaging. (ref: J Am Coll Radiol. 2015 Feb;12(2): 143-50). 5. Other imaging findings of potential clinical significance:  Aortic Atherosclerosis (ICD10-I70.0). Coronary atherosclerosis. Mild cardiomegaly. Nonobstructive nephrolithiasis. Levoconvex lumbar scoliosis.     ASSESSMENT / PLAN / RECOMMENDATIONS:    Adrenal Adenoma:    - B/L adrenal adenomas , stable PET scan 10/2021,  during evaluation for adenocarcinoma of the lung and adrenal adenoma was stable at 2.4 x 1.5 cm -We will proceed with renin, aldosterone, 24-hour urinary catecholamines and cortisol     2. Multinodular Goiter:    - Pt is clinically euthyroid  - No local neck symptoms - S/P benign FNA of the left thyroid nodule 06/2020 -Repeat thyroid ultrasound 11/2021 confirm stability   3.  Elevated Cortisol :   -Patient was noted with elevated 24-hour urinary cortisol at 57.1 mcg(reference 50) in April 2023 Her dexamethasone suppression test also came back elevated at 2.9 (reference 1.8) -Since abnormality was only slight, we wait and repeat on today's visit as her prior testing was within 2 months of completing radiation therapy -Previous ACTH was low-normal level -We will repeat ACTH, serum cortisol, 24-hour urinary cortisol and saliva cortisol testing x2 -We discussed that this could be adrenal in origin, lung cancer in origin, versus pituitary in origin.  The first 2 arm more likely given low ACTH in the past  F/U in 4 months     Signed electronically by: Mack Guise, MD  [facility] [address]  [facility] [address] [place],  [address] Phone: [phone] FAX: [phone]      CC: Maryland Pink, [facility] [place] [address] Phone: [phone]  Fax: [phone]   Return to Endocrinology clinic as  below: Future Appointments  Date Time Provider [place]  06/04/2022  8:00 AM [facility] None  06/04/2022  9:00 AM [facility]  06/08/2022  8:15 AM Curt Bears, MD [facility] None  06/23/2022  2:45 PM Billey Co, MD [facility] None  11/30/2022  7:30 AM Benji Poynter, Melanie Crazier, MD [facility] None

## 2022-05-14 NOTE — Patient Instructions (Signed)
24-Hour Urine Collection  You will be collecting your urine for a 24-hour period of time. Your timer starts with your first urine of the morning (For example - If you first pee at [place], your timer will start at [place]) [place] away your first urine of the morning Collect your urine every time you pee for the next 24 hours STOP your urine collection 24 hours after you started the collection (For example - You would stop at 9AM the day after you started)   SALIVARY CORTISOL COLLECTION INSTRUCTIONS    Precautions:  Please collect sample at [place] . You will need to do this on 2 nights  No food or fluids 30 minutes prior to collection.  Do not use any creams, lotions on hands, or use steroid inhalers 24- hours prior to collection.  Wash hands carefully.  Avoid any activity that could cause your gums to bleed: including flushing of brushing your teeth.  Kit must not be used in children less than 44 years of age, or a person that is at risk for choking on collection kit.  Instructions for saliva collection:   Rinse mouth thoroughly with water and discard. Do not swallow.  Hold the Salivette at the rim of the suspended insert and remove the stopper.  Remove the swab.  Place swab under tongue until well saturated, approximately 1 minute.   Return the saturated swab to the suspended insert and close the Salivette firmly with the stopper.  Do not remove the tube holding the insert. The Salivette should be sent to the lab with the swab.   Come to the lab and leave the Salivette kit for labeling with your identifying information.   Make sure you refrigerate sample if not bringing to the lab immediately. Try to use cold packs for transportation if available.

## 2022-05-18 ENCOUNTER — Other Ambulatory Visit: Payer: Medicare HMO

## 2022-05-18 ENCOUNTER — Other Ambulatory Visit: Payer: Self-pay | Admitting: Internal Medicine

## 2022-05-18 ENCOUNTER — Other Ambulatory Visit: Payer: Self-pay | Admitting: Family Medicine

## 2022-05-18 DIAGNOSIS — Z1231 Encounter for screening mammogram for malignant neoplasm of breast: Secondary | ICD-10-CM

## 2022-05-18 DIAGNOSIS — R7989 Other specified abnormal findings of blood chemistry: Secondary | ICD-10-CM

## 2022-05-18 DIAGNOSIS — D3500 Benign neoplasm of unspecified adrenal gland: Secondary | ICD-10-CM

## 2022-05-18 LAB — CORTISOL, URINE, FREE: Cortisol,F,ug/L,U: 2 ug/L

## 2022-05-18 NOTE — Progress Notes (Unsigned)
Total volmume 3,000.  Started 05-17-2022 at 6:00 am.  Ended 05-18-2022 at 6:00 am.

## 2022-05-20 LAB — CREATININE, URINE, 24 HOUR
Creatinine, 24H Ur: 921 mg/24 hr (ref 800–1800)
Creatinine, Urine: 30.7 mg/dL

## 2022-05-21 ENCOUNTER — Other Ambulatory Visit: Payer: Self-pay

## 2022-05-21 LAB — SPECIMEN STATUS REPORT

## 2022-05-21 LAB — T3, FREE: T3, Free: 2.4 pg/mL (ref 2.0–4.4)

## 2022-05-23 LAB — CORTISOL: Cortisol: 7 ug/dL (ref 6.2–19.4)

## 2022-05-23 LAB — BASIC METABOLIC PANEL
BUN/Creatinine Ratio: 25 (ref 12–28)
BUN: 18 mg/dL (ref 8–27)
CO2: 23 mmol/L (ref 20–29)
Calcium: 9.1 mg/dL (ref 8.7–10.3)
Chloride: 101 mmol/L (ref 96–106)
Creatinine, Ser: 0.72 mg/dL (ref 0.57–1.00)
Glucose: 110 mg/dL — ABNORMAL HIGH (ref 70–99)
Potassium: 4.3 mmol/L (ref 3.5–5.2)
Sodium: 143 mmol/L (ref 134–144)
eGFR: 91 mL/min/{1.73_m2} (ref >59)

## 2022-05-23 LAB — ALDOSTERONE + RENIN ACTIVITY W/ RATIO
ALDOS/RENIN RATIO: 18.3 (ref 0.0–30.0)
ALDOSTERONE: 7.8 ng/dL (ref 0.0–30.0)
Renin: 0.426 ng/mL/hr (ref 0.167–5.380)

## 2022-05-23 LAB — T4, FREE: Free T4: 1.81 ng/dL — ABNORMAL HIGH (ref 0.82–1.77)

## 2022-05-23 LAB — TSH: TSH: 5.96 u[IU]/mL — ABNORMAL HIGH (ref 0.450–4.500)

## 2022-05-23 LAB — ACTH: ACTH: 13 pg/mL (ref 7.2–63.3)

## 2022-05-24 ENCOUNTER — Other Ambulatory Visit: Payer: Self-pay | Admitting: Internal Medicine

## 2022-05-24 ENCOUNTER — Telehealth: Payer: Self-pay | Admitting: Internal Medicine

## 2022-05-24 LAB — CATECHOLAMINES, FRACTIONATED, URINE, 24 HOUR
Dopamine , 24H Ur: 456 ug/24 hr (ref 0–510)
Dopamine, Rand Ur: 152 ug/L
Epinephrine, 24H Ur: 15 ug/24 hr (ref 0–20)
Epinephrine, Rand Ur: 5 ug/L
Norepinephrine, 24H Ur: 171 ug/24 hr — ABNORMAL HIGH (ref 0–135)
Norepinephrine, Rand Ur: 57 ug/L

## 2022-05-24 LAB — SALIVARY CORTISOL X2, TIMED
Salivary Cortisol 2nd Specimen: 0.105 ug/dL
Salivary Cortisol Baseline: 0.061 ug/dL

## 2022-05-24 LAB — CREATININE, URINE, 24 HOUR

## 2022-05-24 NOTE — Progress Notes (Unsigned)
Total volume=3,000 Start date:05-17-22 6am End date:05-18-22 6am

## 2022-05-24 NOTE — Telephone Encounter (Signed)
Tammy,  Can you call the lab on this patient for the 24-hour urinary cortisol?  They wrote that there is no 24-hour urine volume documented and are unable to calculate the cortisol    Can you please follow-up on that?   thanks

## 2022-05-25 ENCOUNTER — Ambulatory Visit
Admission: RE | Admit: 2022-05-25 | Discharge: 2022-05-25 | Disposition: A | Discharge status: Home / Self Care | Payer: Medicare HMO | Source: Ambulatory Visit | Attending: Family Medicine | Admitting: Family Medicine

## 2022-05-25 DIAGNOSIS — Z1231 Encounter for screening mammogram for malignant neoplasm of breast: Secondary | ICD-10-CM | POA: Insufficient documentation

## 2022-05-27 ENCOUNTER — Ambulatory Visit: Payer: Medicare HMO | Admitting: Urology

## 2022-06-02 ENCOUNTER — Ambulatory Visit: Payer: Medicare HMO | Admitting: Urology

## 2022-06-04 ENCOUNTER — Ambulatory Visit (HOSPITAL_COMMUNITY)
Admission: RE | Admit: 2022-06-04 | Discharge: 2022-06-04 | Disposition: A | Discharge status: Home / Self Care | Payer: Medicare HMO | Source: Ambulatory Visit | Attending: Internal Medicine | Admitting: Internal Medicine

## 2022-06-04 ENCOUNTER — Inpatient Hospital Stay: Payer: Medicare HMO | Attending: Internal Medicine

## 2022-06-04 DIAGNOSIS — C3411 Malignant neoplasm of upper lobe, right bronchus or lung: Secondary | ICD-10-CM | POA: Diagnosis present

## 2022-06-04 DIAGNOSIS — I110 Hypertensive heart disease with heart failure: Secondary | ICD-10-CM | POA: Insufficient documentation

## 2022-06-04 DIAGNOSIS — E119 Type 2 diabetes mellitus without complications: Secondary | ICD-10-CM | POA: Insufficient documentation

## 2022-06-04 DIAGNOSIS — I509 Heart failure, unspecified: Secondary | ICD-10-CM | POA: Insufficient documentation

## 2022-06-04 DIAGNOSIS — Z90710 Acquired absence of both cervix and uterus: Secondary | ICD-10-CM | POA: Insufficient documentation

## 2022-06-04 LAB — CMP (CANCER CENTER ONLY)
ALT: 25 U/L (ref 0–44)
AST: 21 U/L (ref 15–41)
Albumin: 4.4 g/dL (ref 3.5–5.0)
Alkaline Phosphatase: 84 U/L (ref 38–126)
Anion gap: 10 (ref 5–15)
BUN: 14 mg/dL (ref 8–23)
CO2: 30 mmol/L (ref 22–32)
Calcium: 9.3 mg/dL (ref 8.9–10.3)
Chloride: 103 mmol/L (ref 98–111)
Creatinine: 0.75 mg/dL (ref 0.44–1.00)
GFR, Estimated: >60 mL/min (ref >60)
Glucose, Bld: 107 mg/dL — ABNORMAL HIGH (ref 70–99)
Potassium: 4 mmol/L (ref 3.5–5.1)
Sodium: 143 mmol/L (ref 135–145)
Total Bilirubin: 0.6 mg/dL (ref 0.3–1.2)
Total Protein: 8 g/dL (ref 6.5–8.1)

## 2022-06-04 LAB — CBC WITH DIFFERENTIAL (CANCER CENTER ONLY)
Abs Immature Granulocytes: 0.03 10*3/uL (ref 0.00–0.07)
Basophils Absolute: 0.1 10*3/uL (ref 0.0–0.1)
Basophils Relative: 1 %
Eosinophils Absolute: 0.2 10*3/uL (ref 0.0–0.5)
Eosinophils Relative: 2 %
HCT: 43.3 % (ref 36.0–46.0)
Hemoglobin: 14.2 g/dL (ref 12.0–15.0)
Immature Granulocytes: 0 %
Lymphocytes Relative: 24 %
Lymphs Abs: 2.2 10*3/uL (ref 0.7–4.0)
MCH: 26.9 pg (ref 26.0–34.0)
MCHC: 32.8 g/dL (ref 30.0–36.0)
MCV: 82.2 fL (ref 80.0–100.0)
Monocytes Absolute: 0.9 10*3/uL (ref 0.1–1.0)
Monocytes Relative: 9 %
Neutro Abs: 6.1 10*3/uL (ref 1.7–7.7)
Neutrophils Relative %: 64 %
Platelet Count: 274 10*3/uL (ref 150–400)
RBC: 5.27 MIL/uL — ABNORMAL HIGH (ref 3.87–5.11)
RDW: 16.3 % — ABNORMAL HIGH (ref 11.5–15.5)
WBC Count: 9.5 10*3/uL (ref 4.0–10.5)
nRBC: 0 % (ref 0.0–0.2)

## 2022-06-04 MED ORDER — SODIUM CHLORIDE (PF) 0.9 % IJ SOLN
INTRAMUSCULAR | Status: AC
  Filled 2022-06-04: qty 50

## 2022-06-04 MED ORDER — IOHEXOL 300 MG/ML  SOLN
75.0000 mL | Freq: Once | INTRAMUSCULAR | Status: AC | PRN
  Administered 2022-06-04: 75 mL via INTRAVENOUS

## 2022-06-08 ENCOUNTER — Inpatient Hospital Stay (HOSPITAL_BASED_OUTPATIENT_CLINIC_OR_DEPARTMENT_OTHER): Payer: Medicare HMO | Admitting: Internal Medicine

## 2022-06-08 VITALS — BP 160/76 | HR 73 | Temp 98.9°F | Resp 15 | Wt 184.1 lb

## 2022-06-08 DIAGNOSIS — I110 Hypertensive heart disease with heart failure: Secondary | ICD-10-CM | POA: Diagnosis not present

## 2022-06-08 DIAGNOSIS — Z90710 Acquired absence of both cervix and uterus: Secondary | ICD-10-CM | POA: Diagnosis not present

## 2022-06-08 DIAGNOSIS — I509 Heart failure, unspecified: Secondary | ICD-10-CM | POA: Diagnosis not present

## 2022-06-08 DIAGNOSIS — C3411 Malignant neoplasm of upper lobe, right bronchus or lung: Secondary | ICD-10-CM | POA: Diagnosis present

## 2022-06-08 DIAGNOSIS — C3490 Malignant neoplasm of unspecified part of unspecified bronchus or lung: Secondary | ICD-10-CM | POA: Diagnosis not present

## 2022-06-08 DIAGNOSIS — E119 Type 2 diabetes mellitus without complications: Secondary | ICD-10-CM | POA: Diagnosis not present

## 2022-06-08 NOTE — Progress Notes (Signed)
[place] [phone]   [phone]  OFFICE PROGRESS NOTE  Shamleffer, Melanie Crazier, MD [address] 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 Molecular Study by [employer] [facility]: Positive EGFR V769M, G719S PDL1 Expression: 0%  PRIOR THERAPY: SBRT to the right upper lobe lung nodule under the care of Dr. Lisbeth Renshaw  CURRENT THERAPY: Observation  INTERVAL HISTORY: Dorothy Gross 71 y.o. female returns to the clinic today for follow-up visit accompanied by her son Gerald Stabs.  The patient is feeling fine today with no concerning complaints except for fatigue.  She has a history of congestive heart failure and she is currently on multiple medications and she has been fatigued for several years.  She denied having any chest pain, shortness of breath, cough or hemoptysis.  She has no nausea, vomiting, diarrhea or constipation.  She has no headache or visual changes.  She denied having any significant weight loss or night sweats.  She is here today for evaluation with repeat CT scan of the chest for restaging of her disease.  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  MEDICATIONS:  Current Outpatient Medications  Medication Sig Dispense Refill   acetaminophen (TYLENOL) 500 MG tablet Take 1,000 mg by mouth every 6 (six) hours as needed.     amiodarone (PACERONE) 200 MG tablet Take 200 mg by mouth daily.      apixaban (ELIQUIS) 5  MG TABS tablet Take 5 mg by mouth 2 (two) times daily.      BIOTIN PO Take 1 capsule by mouth daily.     calcium carbonate (OS-CAL - DOSED IN MG OF ELEMENTAL CALCIUM) 1250 (500 Ca) MG tablet Take 1 tablet by mouth daily with breakfast.     carvedilol (COREG) 6.25 MG tablet Take 6.25 mg by mouth 2 (two) times daily with a meal.      Cholecalciferol (VITAMIN D3) 1000 units CAPS Take 1,000 Units by mouth daily.      cyanocobalamin 1000 MCG tablet Take 1,000 mcg by mouth daily.      digoxin (LANOXIN) 0.125 MG tablet Take 0.125 mg by mouth daily.      ferrous sulfate 325 (65 FE) MG tablet Take 325 mg by mouth every other day.     Flaxseed, Linseed, (FLAX SEED OIL PO) Take 2 capsules by mouth daily.     furosemide (LASIX) 40 MG tablet Take 40 mg by mouth daily.      glucose blood (ONETOUCH ULTRA) test strip USE 1 EACH (1 STRIP TOTAL) 3 (THREE) TIMES DAILY     Lancets MISC      losartan (COZAAR) 25 MG tablet Take 50 mg by mouth daily.     losartan (COZAAR) 25 MG tablet Take 2 tablets by mouth daily.     metFORMIN (GLUCOPHAGE-XR) 500 MG 24 hr tablet Take 1,000 mg by mouth daily with supper.  Multiple Vitamins-Minerals (MULTIVITAMIN ADULT PO) Take 1 tablet by mouth daily.     Polyethyl Glycol-Propyl Glycol (SYSTANE OP) Place 1 drop into both eyes daily as needed (dry eyes).     rosuvastatin (CRESTOR) 10 MG tablet Take 10 mg by mouth daily.      sodium chloride (OCEAN) 0.65 % SOLN nasal spray Place 1 spray into both nostrils daily as needed (Dry nose).     zinc gluconate 50 MG tablet Take 50 mg by mouth daily.      No current facility-administered medications for this visit.    SURGICAL HISTORY:  Past Surgical History:  Procedure Laterality Date   ABDOMINAL HYSTERECTOMY     APPENDECTOMY     BREAST EXCISIONAL BIOPSY Right [id number]   negative 1974 negative 2015   BRONCHIAL BIOPSY  03/23/2021   Procedure: BRONCHIAL BIOPSIES;  Surgeon: Garner Nash, DO;  Location: [place];  Service:  Pulmonary;;   BRONCHIAL BIOPSY  09/14/2021   Procedure: BRONCHIAL BIOPSIES;  Surgeon: Garner Nash, DO;  Location: [place] [facility];  Service: Pulmonary;;   BRONCHIAL BRUSHINGS  03/23/2021   Procedure: BRONCHIAL BRUSHINGS;  Surgeon: Garner Nash, DO;  Location: [place] [facility];  Service: Pulmonary;;   BRONCHIAL BRUSHINGS  09/14/2021   Procedure: BRONCHIAL BRUSHINGS;  Surgeon: Garner Nash, DO;  Location: [place];  Service: Pulmonary;;   BRONCHIAL NEEDLE ASPIRATION BIOPSY  09/14/2021   Procedure: BRONCHIAL NEEDLE ASPIRATION BIOPSIES;  Surgeon: Garner Nash, DO;  Location: [place];  Service: Pulmonary;;   BRONCHIAL WASHINGS  03/23/2021   Procedure: BRONCHIAL WASHINGS;  Surgeon: Garner Nash, DO;  Location: [place];  Service: Pulmonary;;   CHOLECYSTECTOMY N/A 12/05/2015   Procedure: LAPAROSCOPIC CHOLECYSTECTOMY WITH INTRAOPERATIVE CHOLANGIOGRAM;  Surgeon: Leonie Green, MD;  Location: [facility];  Service: General;  Laterality: N/A;   COLONOSCOPY WITH PROPOFOL N/A 10/18/2016   Procedure: COLONOSCOPY WITH PROPOFOL;  Surgeon: Manya Silvas, MD;  Location: [facility];  Service: Endoscopy;  Laterality: N/A;   COLONOSCOPY WITH PROPOFOL N/A 03/17/2018   Procedure: COLONOSCOPY WITH PROPOFOL;  Surgeon: Manya Silvas, MD;  Location: [facility];  Service: Endoscopy;  Laterality: N/A;   CYSTOSCOPY W/ RETROGRADES Bilateral 03/07/2020   Procedure: CYSTOSCOPY WITH RETROGRADE PYELOGRAM;  Surgeon: Billey Co, MD;  Location: [facility];  Service: Urology;  Laterality: Bilateral;   CYSTOSCOPY/URETEROSCOPY/HOLMIUM LASER/STENT PLACEMENT Bilateral 03/07/2020   Procedure: CYSTOSCOPY/URETEROSCOPY/HOLMIUM LASER/STENT PLACEMENT;  Surgeon: Billey Co, MD;  Location: [facility];  Service: Urology;  Laterality: Bilateral;   FIDUCIAL MARKER PLACEMENT  03/23/2021   Procedure: FIDUCIAL MARKER PLACEMENT;  Surgeon: Garner Nash, DO;  Location: [place] [facility];  Service: Pulmonary;;    JOINT REPLACEMENT     both knees   REPLACEMENT TOTAL KNEE BILATERAL     TONSILLECTOMY     VIDEO BRONCHOSCOPY WITH ENDOBRONCHIAL NAVIGATION Right 03/23/2021   Procedure: VIDEO BRONCHOSCOPY WITH ENDOBRONCHIAL NAVIGATION;  Surgeon: Garner Nash, DO;  Location: [place];  Service: Pulmonary;  Laterality: Right;  ION Case    VIDEO BRONCHOSCOPY WITH RADIAL ENDOBRONCHIAL ULTRASOUND  03/23/2021   Procedure: VIDEO BRONCHOSCOPY WITH RADIAL ENDOBRONCHIAL ULTRASOUND;  Surgeon: Garner Nash, DO;  Location: [place];  Service: Pulmonary;;   VIDEO BRONCHOSCOPY WITH RADIAL ENDOBRONCHIAL ULTRASOUND  09/14/2021   Procedure: RADIAL ENDOBRONCHIAL ULTRASOUND;  Surgeon: Garner Nash, DO;  Location: [place] [facility];  Service: Pulmonary;;    REVIEW OF SYSTEMS:  A comprehensive review of systems was negative except for: Constitutional: positive for fatigue   PHYSICAL  EXAMINATION: General appearance: alert, cooperative, fatigued, and no distress Head: Normocephalic, without obvious abnormality, atraumatic Neck: no adenopathy, no JVD, supple, symmetrical, trachea midline, and thyroid not enlarged, symmetric, no tenderness/mass/nodules Lymph nodes: Cervical, supraclavicular, and axillary nodes normal. Resp: clear to auscultation bilaterally Back: symmetric, no curvature. ROM normal. No CVA tenderness. Cardio: regular rate and rhythm, S1, S2 normal, no murmur, click, rub or gallop GI: soft, non-tender; bowel sounds normal; no masses,  no organomegaly Extremities: extremities normal, atraumatic, no cyanosis or edema  ECOG PERFORMANCE STATUS: 1 - Symptomatic but completely ambulatory  Blood pressure (!) 160/76, pulse 73, temperature 98.9 F (37.2 C), temperature source Oral, resp. rate 15, weight 184 lb 1.6 oz (83.5 kg), SpO2 93 %.  LABORATORY DATA: Lab Results  Component Value Date   WBC 9.5 06/04/2022   HGB 14.2 06/04/2022   HCT 43.3 06/04/2022   MCV 82.2 06/04/2022   PLT 274 06/04/2022       Chemistry      Component Value Date/Time   NA 143 06/04/2022 0757   NA 143 05/14/2022 0832   NA 136 02/12/2013 0606   K 4.0 06/04/2022 0757   K 4.4 02/12/2013 0606   CL 103 06/04/2022 0757   CL 101 02/12/2013 0606   CO2 30 06/04/2022 0757   CO2 30 02/12/2013 0606   BUN 14 06/04/2022 0757   BUN 18 05/14/2022 0832   BUN 25 (H) 02/12/2013 0606   CREATININE 0.75 06/04/2022 0757   CREATININE 0.94 02/12/2013 0606      Component Value Date/Time   CALCIUM 9.3 06/04/2022 0757   CALCIUM 8.8 02/12/2013 0606   ALKPHOS 84 06/04/2022 0757   AST 21 06/04/2022 0757   ALT 25 06/04/2022 0757   BILITOT 0.6 06/04/2022 0757       RADIOGRAPHIC STUDIES: CT Chest W Contrast  Result Date: 06/05/2022 CLINICAL DATA:  Non-small cell lung cancer, multifocal adenocarcinoma, assess treatment response * Tracking Code: BO * EXAM: CT CHEST WITH CONTRAST TECHNIQUE: Multidetector CT imaging of the chest was performed during intravenous contrast administration. RADIATION DOSE REDUCTION: This exam was performed according to the departmental dose-optimization program which includes automated exposure control, adjustment of the mA and/or kV according to patient size and/or use of iterative reconstruction technique. CONTRAST:  81m OMNIPAQUE IOHEXOL 300 MG/ML  SOLN COMPARISON:  01/26/2022 FINDINGS: Cardiovascular: Aortic atherosclerosis. Mild cardiomegaly. Left and right coronary artery calcifications. No pericardial effusion. Mediastinum/Nodes: No enlarged mediastinal, hilar, or axillary lymph nodes. Heterogeneous, multinodular thyroid, previously evaluated by dedicated thyroid ultrasound. This has been evaluated on previous imaging. (ref: J Am Coll Radiol. 2015 Feb;12(2): 143-50). Trachea, and esophagus demonstrate no significant findings. Lungs/Pleura: Interval evolution of radiation fibrosis with coalescing bandlike fibrotic scarring and volume loss about a biopsy marking clip of the right apex (series 5, image 20) as  well as an additional biopsy marking clip of the posterior right upper lobe (series 5, image 54). Unchanged small ground-glass nodules, for example of the peripheral right lower lobe measuring 0.8 cm (series 5, image 82), and of the posterior left upper lobe measuring 0.9 cm (series 5, image 37) and of the anterior left upper lobe measuring 0.7 cm (series 5, image 55). Background of innumerable tiny scattered ground-glass nodules, most concentrated in the lung apices. Minimal bandlike scarring and ground-glass of the bilateral lung bases, unchanged. Small, left-sided Bochdalek's hernia (series 5, image 112). No pleural effusion or pneumothorax. Upper Abdomen: No acute abnormality. Unchanged adenomatous thickening and nodularity of the bilateral adrenal glands, definitively  benign, not previously FDG avid (series 2, image 132). Musculoskeletal: No chest wall abnormality. No acute osseous findings. IMPRESSION: 1. Expected interval evolution of radiation fibrosis about biopsy marking clips of the right apex and posterior right upper lobe. 2. Unchanged small bilateral ground-glass nodules, nonspecific. Attention on follow-up. 3. Background of innumerable tiny scattered ground-glass nodules, most concentrated in the lung apices consistent with smoking-related respiratory bronchiolitis. 4. Coronary artery disease. Aortic Atherosclerosis (ICD10-I70.0). Electronically Signed   By: Delanna Ahmadi M.D.   On: 06/05/2022 20:09   MM 3D SCREEN BREAST BILATERAL  Result Date: 05/28/2022 CLINICAL DATA:  Screening. EXAM: DIGITAL SCREENING BILATERAL MAMMOGRAM WITH TOMOSYNTHESIS AND CAD TECHNIQUE: Bilateral screening digital craniocaudal and mediolateral oblique mammograms were obtained. Bilateral screening digital breast tomosynthesis was performed. The images were evaluated with computer-aided detection. Best images possible per technologist communication. COMPARISON:  Previous exam(s). ACR Breast Density Category c: The breast  tissue is heterogeneously dense, which may obscure small masses. FINDINGS: There are no findings suspicious for malignancy. IMPRESSION: No mammographic evidence of malignancy. A result letter of this screening mammogram will be mailed directly to the patient. RECOMMENDATION: Screening mammogram in one year. (Code:SM-B-01Y) BI-RADS CATEGORY  1: Negative. Electronically Signed   By: Valentino Saxon M.D.   On: 05/28/2022 09:51    ASSESSMENT AND PLAN: This is a very pleasant 71 years old white female with multifocal adenocarcinoma involving the right and left lung mainly presented with  biopsy confirmed right upper lobe lesion diagnosed in January 2023. Molecular Study by [facility] showed Positive EGFR V769M, G719S PDL1 Expression: 0% The patient is status post SBRT to right upper lobe lung nodule under the care of Dr. Lisbeth Renshaw. The patient is currently on observation and she is feeling fine with no concerning complaints. She had repeat CT scan of the chest performed recently.  I personally and independently reviewed the scan images and discussed the result with the patient and her son. Her scan showed no concerning findings for disease progression and she had the expected interval evolution of the radiation fibrosis. I recommended for her to continue on observation with repeat CT scan of the chest in 6 months. The patient was advised to call immediately if she has any other concerning symptoms in the interval. The patient voices understanding of current disease status and treatment options and is in agreement with the current care plan.  All questions were answered. The patient knows to call the clinic with any problems, questions or concerns. We can certainly see the patient much sooner if necessary. The total time spent in the appointment was 20 minutes.  Disclaimer: This note was dictated with voice recognition software. Similar sounding words can inadvertently be transcribed and may not be  corrected upon review.

## 2022-06-23 ENCOUNTER — Ambulatory Visit: Payer: Medicare HMO | Admitting: Urology

## 2022-06-30 ENCOUNTER — Ambulatory Visit: Payer: Medicare HMO | Admitting: Urology

## 2022-07-27 ENCOUNTER — Telehealth: Payer: Self-pay | Admitting: Medical Oncology

## 2022-07-27 NOTE — Telephone Encounter (Signed)
Dorothy Gross asking if ok for Dr Julien Nordmann for her to take the RSV vaccine. I instructed [name] to contact her PCP for her question.

## 2022-08-02 ENCOUNTER — Other Ambulatory Visit: Payer: Medicare HMO

## 2022-08-05 ENCOUNTER — Ambulatory Visit: Payer: Medicare HMO | Admitting: Internal Medicine

## 2022-09-28 ENCOUNTER — Encounter: Payer: Self-pay | Admitting: Internal Medicine

## 2022-09-28 ENCOUNTER — Ambulatory Visit: Payer: Medicare HMO | Admitting: Internal Medicine

## 2022-09-28 VITALS — BP 140/76 | HR 70 | Ht 64.0 in | Wt 189.0 lb

## 2022-09-28 DIAGNOSIS — E042 Nontoxic multinodular goiter: Secondary | ICD-10-CM | POA: Diagnosis not present

## 2022-09-28 DIAGNOSIS — R7989 Other specified abnormal findings of blood chemistry: Secondary | ICD-10-CM | POA: Diagnosis not present

## 2022-09-28 DIAGNOSIS — D3500 Benign neoplasm of unspecified adrenal gland: Secondary | ICD-10-CM

## 2022-09-28 NOTE — Progress Notes (Unsigned)
Name: Dorothy Gross  MRN/ DOB: [id number], 02-20-1951    Age/ Sex: 72 y.o., female     PCP: Ashantia Amaral, Melanie Crazier, MD   Reason for Endocrinology Evaluation: Adrenal adenoma      Initial Endocrinology Clinic Visit: 05/26/2020    PATIENT IDENTIFIER: Dorothy Gross is a 72 y.o., female with a past medical history of A. Fib, CHF, HTN and nephrolithiasis. She has followed with [facility] since 05/26/2020  for consultative assistance with management of her adrenal adenoma  HISTORICAL SUMMARY:   Pt had an incidental finding of bilateral adrenal nodularity bilaterally L>R, with a HFU of 40 and 95 post-contrast on CT scan in 12/2019 during painless hematuria workup.   Her Aldo , renin , urinary free cortisol were all normal. Metanephrines were slightly elevated at less then 2x the upper limit of normal, considered not clinically significant in 2021.  Patient was noted with elevated 24-hour urinary cortisol at 57.1 mcg(reference 50) in April 2023 Her dexamethasone suppression test also came back elevated at 2.9 (reference 1.8) Repeat 24-hour urinary cortisol came back normal 04/2022, one of the 2 saliva cortisol samples came back elevated      NO FH of adrenal gland issues, has strong FH of HTN NO FH of renal stones     THYROID HISTORY: She was noted with left thyroid nodule on exam in 04/2020, this was confirmed by a thyroid ultrasound revealing a left inferior 2.8 cm meeting FNA criteria.  She is S/P FNA of the left thyroid nodule on 06/2020 with benign cytology   SUBJECTIVE:    Today (09/28/2022):  Dorothy Gross is here for adrenal adenoma. 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   Cardiomyopathy ([facility])    CHF (congestive heart failure) ([place])    Diabetes mellitus without complication ([place])    History of kidney stones    Hyperlipidemia    Hypertension    Lung cancer ([place]) 09/14/2021   Past Surgical History:  Past Surgical History:  Procedure Laterality Date   ABDOMINAL HYSTERECTOMY     APPENDECTOMY     BREAST EXCISIONAL BIOPSY Right [id number]   negative 1974 negative 2015   BRONCHIAL BIOPSY  03/23/2021   Procedure: BRONCHIAL BIOPSIES;  Surgeon: Garner Nash, DO;  Location: [facility];  Service: Pulmonary;;   BRONCHIAL BIOPSY  09/14/2021   Procedure: BRONCHIAL BIOPSIES;  Surgeon: Garner Nash, DO;  Location: [facility];  Service: Pulmonary;;   BRONCHIAL BRUSHINGS  03/23/2021  Procedure: BRONCHIAL BRUSHINGS;  Surgeon: Garner Nash, DO;  Location: [facility];  Service: Pulmonary;;   BRONCHIAL BRUSHINGS  09/14/2021   Procedure: BRONCHIAL BRUSHINGS;  Surgeon: Garner Nash, DO;  Location: [facility];  Service: Pulmonary;;   BRONCHIAL NEEDLE ASPIRATION BIOPSY  09/14/2021   Procedure: BRONCHIAL NEEDLE ASPIRATION BIOPSIES;  Surgeon: Garner Nash, DO;  Location: [facility];  Service: Pulmonary;;   BRONCHIAL WASHINGS  03/23/2021   Procedure: BRONCHIAL WASHINGS;  Surgeon: Garner Nash,  DO;  Location: [facility];  Service: Pulmonary;;   CHOLECYSTECTOMY N/A 12/05/2015   Procedure: LAPAROSCOPIC CHOLECYSTECTOMY WITH INTRAOPERATIVE CHOLANGIOGRAM;  Surgeon: Leonie Green, MD;  Location: [facility];  Service: General;  Laterality: N/A;   COLONOSCOPY WITH PROPOFOL N/A 10/18/2016   Procedure: COLONOSCOPY WITH PROPOFOL;  Surgeon: Manya Silvas, MD;  Location[facility];  Service: Endoscopy;  Laterality: N/A;   COLONOSCOPY WITH PROPOFOL N/A 03/17/2018   Procedure: COLONOSCOPY WITH PROPOFOL;  Surgeon: Manya Silvas, MD;  Location: [facility];  Service: Endoscopy;  Laterality: N/A;   CYSTOSCOPY W/ RETROGRADES Bilateral 03/07/2020   Procedure: CYSTOSCOPY WITH RETROGRADE PYELOGRAM;  Surgeon: Billey Co, MD;  Location: [facility];  Service: Urology;  Laterality: Bilateral;   CYSTOSCOPY/URETEROSCOPY/HOLMIUM LASER/STENT PLACEMENT Bilateral 03/07/2020   Procedure: CYSTOSCOPY/URETEROSCOPY/HOLMIUM LASER/STENT PLACEMENT;  Surgeon: Billey Co, MD;  Location: [facility];  Service: Urology;  Laterality: Bilateral;   FIDUCIAL MARKER PLACEMENT  03/23/2021   Procedure: FIDUCIAL MARKER PLACEMENT;  Surgeon: Garner Nash, DO;  Location: [facility];  Service: Pulmonary;;   JOINT REPLACEMENT     both knees   REPLACEMENT TOTAL KNEE BILATERAL     TONSILLECTOMY     VIDEO BRONCHOSCOPY WITH ENDOBRONCHIAL NAVIGATION Right 03/23/2021   Procedure: VIDEO BRONCHOSCOPY WITH ENDOBRONCHIAL NAVIGATION;  Surgeon: Garner Nash, DO;  Location: [facility];  Service: Pulmonary;  Laterality: Right;  ION Case    VIDEO BRONCHOSCOPY WITH RADIAL ENDOBRONCHIAL ULTRASOUND  03/23/2021   Procedure: VIDEO BRONCHOSCOPY WITH RADIAL ENDOBRONCHIAL ULTRASOUND;  Surgeon: Garner Nash, DO;  Location: [facility];  Service: Pulmonary;;   VIDEO BRONCHOSCOPY WITH RADIAL ENDOBRONCHIAL ULTRASOUND  09/14/2021   Procedure: RADIAL ENDOBRONCHIAL ULTRASOUND;  Surgeon: Garner Nash, DO;  Location: [facility];   Service: Pulmonary;;   Social History:  reports that she quit smoking about 15 years ago. Her smoking use included cigarettes. She has a 3.75 pack-year smoking history. She has never used smokeless tobacco. She reports that she does not drink alcohol and does not use drugs. Family History:  Family History  Problem Relation Age of Onset   Diabetes Mother    Diabetes Sister    Diabetes Maternal Aunt    Cancer Maternal Uncle    Cancer Maternal Uncle    Cancer Maternal Uncle    Cancer Maternal Uncle    Cancer Maternal Uncle    Cancer Maternal Uncle    Cancer Maternal Uncle    Cancer Maternal Uncle    Cancer Maternal Uncle    Breast cancer Neg Hx      HOME MEDICATIONS: Allergies as of 09/28/2022   No Known Allergies      Medication List        Accurate as of September 28, 2022  7:03 AM. If you have any questions, ask your nurse or doctor.          acetaminophen 500 MG tablet Commonly known as: TYLENOL Take 1,000 mg by mouth every 6 (six) hours as needed.   amiodarone 200  MG tablet Commonly known as: PACERONE Take 200 mg by mouth daily.   apixaban 5 MG Tabs tablet Commonly known as: ELIQUIS Take 5 mg by mouth 2 (two) times daily.   BIOTIN PO Take 1 capsule by mouth daily.   calcium carbonate 1250 (500 Ca) MG tablet Commonly known as: OS-CAL - dosed in mg of elemental calcium Take 1 tablet by mouth daily with breakfast.   carvedilol 6.25 MG tablet Commonly known as: COREG Take 6.25 mg by mouth 2 (two) times daily with a meal.   cyanocobalamin 1000 MCG tablet Take 1,000 mcg by mouth daily.   digoxin 0.125 MG tablet Commonly known as: LANOXIN Take 0.125 mg by mouth daily.   ferrous sulfate 325 (65 FE) MG tablet Take 325 mg by mouth every other day.   FLAX SEED OIL PO Take 2 capsules by mouth daily.   furosemide 40 MG tablet Commonly known as: LASIX Take 40 mg by mouth daily.   Lancets Misc   losartan 25 MG tablet Commonly known as: COZAAR Take 50  mg by mouth daily.   losartan 25 MG tablet Commonly known as: COZAAR Take 2 tablets by mouth daily.   metFORMIN 500 MG 24 hr tablet Commonly known as: GLUCOPHAGE-XR Take 1,000 mg by mouth daily with supper.   MULTIVITAMIN ADULT PO Take 1 tablet by mouth daily.   OneTouch Ultra test strip Generic drug: glucose blood USE 1 EACH (1 STRIP TOTAL) 3 (THREE) TIMES DAILY   rosuvastatin 10 MG tablet Commonly known as: CRESTOR Take 10 mg by mouth daily.   sodium chloride 0.65 % Soln nasal spray Commonly known as: OCEAN Place 1 spray into both nostrils daily as needed (Dry nose).   SYSTANE OP Place 1 drop into both eyes daily as needed (dry eyes).   Vitamin D3 25 MCG (1000 UT) Caps Take 1,000 Units by mouth daily.   zinc gluconate 50 MG tablet Take 50 mg by mouth daily.          OBJECTIVE:   PHYSICAL EXAM: VS:BP (!) 140/76   Pulse 70   Ht 5\' 4"  (1.626 m)   Wt 189 lb (85.7 kg)   SpO2 96%   BMI 32.44 kg/m    EXAM: General: Pt appears well and is in NAD  Thyroid : Left thyroid nodule appreciated   Lungs: Clear with good BS bilat with no rales, rhonchi, or wheezes  Heart: Auscultation: RRR.  Abdomen: Normoactive bowel sounds, soft, nontender, without masses or organomegaly palpable  Extremities:  BL LE: No pretibial edema normal ROM and strength.  Mental Status: Judgment, insight: Intact Orientation: Oriented to time, place, and person Mood and affect: No depression, anxiety, or agitation     DATA REVIEWED:   Latest Reference Range & Units 09/28/22 08:20  TSH 0.40 - 4.50 mIU/L 8.62 (H)  T4,Free(Direct) 0.8 - 1.8 ng/dL 1.5  (H): Data is abnormally high    Latest Reference Range & Units 06/04/22 07:57  Sodium 135 - 145 mmol/L 143  Potassium 3.5 - 5.1 mmol/L 4.0  Chloride 98 - 111 mmol/L 103  CO2 22 - 32 mmol/L 30  Glucose 70 - 99 mg/dL 107 (H)  BUN 8 - 23 mg/dL 14  Creatinine 0.44 - 1.00 mg/dL 0.75  Calcium 8.9 - 10.3 mg/dL 9.3  Anion gap 5 - 15  10   Alkaline Phosphatase 38 - 126 U/L 84  Albumin 3.5 - 5.0 g/dL 4.4  AST 15 - 41 U/L 21  ALT 0 -  44 U/L 25  Total Protein 6.5 - 8.1 g/dL 8.0  Total Bilirubin 0.3 - 1.2 mg/dL 0.6  GFR, Est Non African American >60 mL/min >60  (H): Data is abnormally high   Latest Reference Range & Units 05/14/22 08:32  ACTH 7.2 - 63.3 pg/mL 13.0  ALDOSTERONE  WILL FOLLOW (P)  Cortisol 6.2 - 19.4 ug/dL 7.0  Renin  WILL FOLLOW (P)  ALDOS/RENIN RATIO  WILL FOLLOW (P)  Glucose 70 - 99 mg/dL 110 (H)  TSH 0.450 - 4.500 uIU/mL 5.960 (H)  T4,Free(Direct) 0.82 - 1.77 ng/dL 1.81 (H)    Thyroid Ultrasound 12/03/2021  Estimated total number of nodules >/= 1 cm: 2   Number of spongiform nodules >/=  2 cm not described below (TR1): 0   Number of mixed cystic and solid nodules >/= 1.5 cm not described below ([place]): 0   _________________________________________________________   Nodule 1: 0.8 x 0.6 x 0.6 cm spongiform nodule in the superior right thyroid lobe does not meet criteria for imaging surveillance or FNA.   _________________________________________________________   Nodule # 2:   Prior biopsy: No   Location: Right; inferior   Maximum size: 1.2 cm; Other 2 dimensions: 0.7 x 0.8 cm, previously, 1.1 x 0.8 x 0.8 cm   Composition: solid/almost completely solid (2)   Echogenicity: isoechoic (1)   Shape: not taller-than-wide (0)   Margins: ill-defined (0)   Echogenic foci: none (0)   ACR TI-RADS total points: 3.   ACR TI-RADS risk category:  TR3 (3 points).   Significant change in size (>/= 20% in two dimensions and minimal increase of 2 mm): No   Change in features: No   Change in ACR TI-RADS risk category: No   ACR TI-RADS recommendations:   Given size (<1.4 cm) and appearance, this nodule does NOT meet TI-RADS criteria for biopsy or dedicated follow-up.   _________________________________________________________   Nodule 3: Previously biopsy solid isoechoic left thyroid  nodule measuring 3.4 x 2.0 x 2.6 cm is not significantly changed in size from prior examination where it measured 2.8 x 2.2 x 2.4 cm. Please correlate with FNA results from 07/18/2020.   IMPRESSION: 1. Previously biopsied left inferior thyroid nodule is unchanged in size. Please correlate with FNA results from 07/18/2020. 2. Right thyroid nodules do not meet criteria for FNA or imaging surveillance.    FNA of the left  thyroid Nodule 07/18/2020  DIAGNOSIS:  A. THYROID GLAND, LEFT LOBE; ULTRASOUND-GUIDED FINE-NEEDLE ASPIRATION:  - BENIGN ([place] CATEGORY II).   PET scan 11/02/2021 NECK: Left thyroid nodules including a 1.6 cm hypodense nodule and a 1.5 cm hypodense nodule, with metabolic activity similar to the rest of the thyroid gland.   Incidental CT findings: Bilateral common carotid atherosclerotic calcification.   CHEST: Fiducial near the dominant right lower lobe nodule sitting just anterior to the major fissure. This sub solid nodule measures 1.3 by 1.0 cm on image 64 series 4 has a maximum SUV of 2.1.   The subpleural 1.5 by 0.9 cm nodule at the right lung apex has a maximum SUV of 1.7. The remaining sub solid nodules are too small to characterize.   Low-grade activity along the hazy bilateral nonfocal subpleural reticulation primarily in the lower lobes.   Incidental CT findings: Coronary, aortic arch, and branch vessel atherosclerotic vascular disease. Mild cardiomegaly.   ABDOMEN/PELVIS: Lobulated left adrenal mass 2.4 by 1.5 cm on image 103 of series 4, previously 2.6 by 1.6 cm back on 01/09/2020, with only low-grade metabolic  activity, maximum SUV 3.5 (the contralateral morphologically normal appearing right adrenal gland has a maximum SUV of 3.7). This is probably an adenoma based on prior absolute washout of 67%. Current noncontrast density measurements are nonspecific.   Widespread accentuated bowel activity is likely physiologic.   Incidental CT  findings: Cholecystectomy. Nonobstructive nephrolithiasis on the right. Residual contrast in the renal collecting systems and urinary bladder. Atherosclerosis is present, including aortoiliac atherosclerotic disease.   SKELETON: No significant abnormal hypermetabolic activity in this region.   Incidental CT findings: Mild levoconvex lumbar scoliosis.   IMPRESSION: 1. The dominant subsolid right lower lobe nodule just anterior to the major fissure has a maximum SUV of 2.1, compatible with low-grade adenocarcinoma based on positive biopsy result. The subpleural 1.5 by 0.9 cm nodule at the right lung apex has a maximum SUV of 1.7. The other sub solid pulmonary nodules are too small to characterize. 2. Low-grade activity along hazy bilateral nonfocal subpleural reticulations in the lower lobes, likely inflammatory. 3. Lobulated left adrenal mass is not hypermetabolic compared to the contralateral morphologically normal right adrenal gland, and is probably an adenoma based on current and prior imaging. 4. Left thyroid nodules including a 1.6 cm nodule. These were worked up on prior thyroid ultrasound of 06/25/2020 and biopsy of 07/18/2020, which was benign. This has been evaluated on previous imaging. (ref: J Am Coll Radiol. 2015 Feb;12(2): 143-50). 5. Other imaging findings of potential clinical significance: Aortic Atherosclerosis (ICD10-I70.0). Coronary atherosclerosis. Mild cardiomegaly. Nonobstructive nephrolithiasis. Levoconvex lumbar scoliosis.     ASSESSMENT / PLAN / RECOMMENDATIONS:    Adrenal Adenoma:    - B/L adrenal adenomas , stable PET scan 10/2021,  during evaluation for adenocarcinoma of the lung and adrenal adenoma was stable at 2.4 x 1.5 cm -Renin and aldo have been normal,  24-hour urinary catecholamines  showed slight elevated in norepinephrine (171) reference 135 ug) 04/2022 , epinephrine, and dopamine normal      2. Multinodular Goiter:    - Pt is clinically  euthyroid  - No local neck symptoms - S/P benign FNA of the left thyroid nodule 06/2020 -Repeat thyroid ultrasound 11/2021 confirm stability     3.  Elevated Cortisol :   -Patient was noted with elevated 24-hour urinary cortisol at 57.1 mcg(reference 50) in April 2023 Her dexamethasone suppression test also came back elevated at 2.9 (reference 1.8) -Previous ACTH was low-normal level -Repeat 24-hr urine cortisol was low at 2 ug (04/2022) with one of the salivary cortisol elevated at 0.105 04/2022, ACTH normal at 13 pg/mL  -We discussed that this could be due to stress, adrenal in origin, lung cancer in origin, versus pituitary in origin.  - Will repeat 24-hr urine cortisol    4. Subclinical Hypothyroidism:  -Patient on amiodarone chronically -She held biotin appropriately -Free T4 has normalized but TSH is trending up, I would recommend levothyroxine as below -Discussed lab results with the patient on 09/29/2022 at 1530 and she is in agreement  Medication Start levothyroxine 25 mcg daily  F/U in 4 months     Signed electronically by: Mack Guise, MD  [facility]  [address] [place], [place] [address] Phone: [phone] FAX: [phone]      CC: Regan, Melanie Crazier, MD [address] [place] [address] Phone: [phone]  Fax: [phone]   Return to Endocrinology clinic as below: Future Appointments  Date Time Provider [place]  09/28/2022  7:50 AM Marajade Lei, Mammie Lorenzo  Amedeo Kinsman, MD [facility] None  11/30/2022  7:30 AM Zaylin Pistilli, Melanie Crazier, MD [facility] None  12/06/2022 11:00 AM [facility] None  12/08/2022 11:15 AM Curt Bears, MD [facility] None

## 2022-09-28 NOTE — Patient Instructions (Signed)

## 2022-09-29 LAB — T4, FREE: Free T4: 1.5 ng/dL (ref 0.8–1.8)

## 2022-09-29 LAB — TSH: TSH: 8.62 mIU/L — ABNORMAL HIGH (ref 0.40–4.50)

## 2022-09-29 MED ORDER — LEVOTHYROXINE SODIUM 25 MCG PO TABS: 25.0000 ug | ORAL_TABLET | Freq: Every day | ORAL | 3 refills | Status: DC

## 2022-10-05 ENCOUNTER — Other Ambulatory Visit: Payer: Medicare HMO

## 2022-10-05 DIAGNOSIS — D3500 Benign neoplasm of unspecified adrenal gland: Secondary | ICD-10-CM

## 2022-10-05 DIAGNOSIS — R7989 Other specified abnormal findings of blood chemistry: Secondary | ICD-10-CM

## 2022-10-05 NOTE — Progress Notes (Unsigned)
Patient dropped off two 24 hour urine containers both with perservatives. The total volume of container one was 1752ml this was used for the catecholamines. The second container volume was 2000 ml and was used for the Cortisol  Start date: 10/04/2022 at 7am  End date 10/05/22 at 7am

## 2022-10-14 LAB — CATECHOLAMINES, FRACTIONATED, URINE, 24 HOUR
Calc Total (E+NE): 34 mcg/24 h (ref 26–121)
Creatinine, Urine mg/day-CATEUR: 0.94 g/(24.h) (ref 0.50–2.15)
Dopamine 24 Hr Urine: 136 mcg/24 h (ref 52–480)
Norepinephrine, 24H, Ur: 34 mcg/24 h (ref 15–100)
Total Volume: 2000 mL

## 2022-10-14 LAB — CORTISOL, URINE, 24 HOUR
24 Hour urine volume (VMAHVA): 2000 mL
CREATININE, URINE: 0.93 g/(24.h) (ref 0.50–2.15)
Cortisol (Ur), Free: 18 mcg/24 h (ref 4.0–50.0)

## 2022-10-19 ENCOUNTER — Other Ambulatory Visit: Payer: Self-pay

## 2022-11-16 ENCOUNTER — Telehealth: Payer: Self-pay | Admitting: Internal Medicine

## 2022-11-16 NOTE — Telephone Encounter (Signed)
Called patient regarding April appointments, left a voicemail.  

## 2022-11-29 NOTE — Progress Notes (Unsigned)
Name: Dorothy Gross  MRN/ DOB: [id number], 02-Dec-1950    Age/ Sex: 72 y.o., female     PCP: Kedrick Mcnamee, Melanie Crazier, MD   Reason for Endocrinology Evaluation: Adrenal adenoma      Initial Endocrinology Clinic Visit: 05/26/2020    PATIENT IDENTIFIER: Dorothy Gross is a 72 y.o., female with a past medical history of A. Fib, CHF, HTN and nephrolithiasis. She has followed with [facility] [place] [facility] since 05/26/2020  for consultative assistance with management of her adrenal adenoma  HISTORICAL SUMMARY:   Pt had an incidental finding of bilateral adrenal nodularity bilaterally L>R, with a HFU of 40 and 95 post-contrast on CT scan in 12/2019 during painless hematuria workup.   Her Aldo , renin , urinary free cortisol were all normal. Metanephrines were slightly elevated at less then 2x the upper limit of normal, considered not clinically significant in 2021.  Patient was noted with elevated 24-hour urinary cortisol at 57.1 mcg(reference 50) in April 2023 Her dexamethasone suppression test also came back elevated at 2.9 (reference 1.8) Repeat 24-hour urinary cortisol came back normal 04/2022, one of the 2 saliva cortisol samples came back elevated      NO FH of adrenal gland issues, has strong FH of HTN NO FH of renal stones     THYROID HISTORY: She was noted with left thyroid nodule on exam in 04/2020, this was confirmed by a thyroid ultrasound revealing a left inferior 2.8 cm meeting FNA criteria.  She is S/P FNA of the left thyroid nodule on 06/2020 with benign cytology   She was started on levothyroxine with a TSH 8.62 uIU/ml 08/2022  SUBJECTIVE:    Today (11/30/2022):  Dorothy Gross is here for adrenal adenoma. 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 Fib  Palpitations- no  She held Biotin 3 days ago  Levothyroxine 25 mcg daily      HISTORY:  Past Medical History:  Past Medical History:  Diagnosis Date   A-fib    Arthritis    CAD (coronary artery disease)    Cancer    squamous cell on forehead   Cardiomyopathy    CHF (congestive heart failure)    Diabetes mellitus without complication    History of kidney stones    Hyperlipidemia    Hypertension    Lung cancer 09/14/2021   Past Surgical History:  Past Surgical History:  Procedure Laterality Date   ABDOMINAL HYSTERECTOMY     APPENDECTOMY     BREAST EXCISIONAL BIOPSY Right [id number]   negative 1974 negative 2015   BRONCHIAL BIOPSY  03/23/2021   Procedure: BRONCHIAL BIOPSIES;  Surgeon: Garner Nash, DO;  Location: [place] [facility];  Service: Pulmonary;;   BRONCHIAL BIOPSY  09/14/2021   Procedure: BRONCHIAL BIOPSIES;  Surgeon: Garner Nash, DO;  Location: [place] [facility];  Service: Pulmonary;;   BRONCHIAL BRUSHINGS  03/23/2021   Procedure: BRONCHIAL BRUSHINGS;  Surgeon: Garner Nash, DO;  Location: [place] [facility];  Service: Pulmonary;;   BRONCHIAL BRUSHINGS  09/14/2021   Procedure: BRONCHIAL BRUSHINGS;  Surgeon: Garner Nash, DO;  Location: [place] [facility];  Service: Pulmonary;;   BRONCHIAL NEEDLE ASPIRATION BIOPSY  09/14/2021   Procedure: BRONCHIAL NEEDLE ASPIRATION BIOPSIES;  Surgeon: Garner Nash, DO;  Location: [place] [facility];  Service: Pulmonary;;   BRONCHIAL WASHINGS  03/23/2021   Procedure: BRONCHIAL WASHINGS;  Surgeon: Garner Nash, DO;  Location: [place] [facility];  Service: Pulmonary;;   CHOLECYSTECTOMY N/A 12/05/2015   Procedure: LAPAROSCOPIC CHOLECYSTECTOMY WITH INTRAOPERATIVE  CHOLANGIOGRAM;  Surgeon: Leonie Green, MD;  Location: [facility];  Service: General;  Laterality: N/A;   COLONOSCOPY WITH PROPOFOL N/A 10/18/2016   Procedure: COLONOSCOPY WITH PROPOFOL;  Surgeon: Manya Silvas, MD;  Location: [facility];  Service: Endoscopy;  Laterality: N/A;   COLONOSCOPY WITH PROPOFOL N/A 03/17/2018   Procedure: COLONOSCOPY WITH PROPOFOL;  Surgeon: Manya Silvas, MD;  Location: [facility];  Service: Endoscopy;  Laterality: N/A;   CYSTOSCOPY W/ RETROGRADES Bilateral 03/07/2020   Procedure: CYSTOSCOPY WITH RETROGRADE PYELOGRAM;  Surgeon: Billey Co, MD;  Location: [facility];  Service: Urology;  Laterality: Bilateral;   CYSTOSCOPY/URETEROSCOPY/HOLMIUM LASER/STENT PLACEMENT Bilateral 03/07/2020   Procedure: CYSTOSCOPY/URETEROSCOPY/HOLMIUM LASER/STENT PLACEMENT;  Surgeon: Billey Co, MD;  Location: [facility];  Service: Urology;  Laterality: Bilateral;   FIDUCIAL MARKER PLACEMENT  03/23/2021   Procedure: FIDUCIAL MARKER PLACEMENT;  Surgeon: Garner Nash, DO;  Location: [place] [facility];  Service: Pulmonary;;   JOINT REPLACEMENT     both knees   REPLACEMENT TOTAL KNEE BILATERAL     TONSILLECTOMY     VIDEO BRONCHOSCOPY WITH ENDOBRONCHIAL NAVIGATION Right 03/23/2021   Procedure: VIDEO BRONCHOSCOPY WITH ENDOBRONCHIAL NAVIGATION;  Surgeon: Garner Nash, DO;  Location: [place];  Service: Pulmonary;  Laterality: Right;  ION Case    VIDEO BRONCHOSCOPY WITH RADIAL ENDOBRONCHIAL ULTRASOUND  03/23/2021   Procedure: VIDEO BRONCHOSCOPY WITH RADIAL ENDOBRONCHIAL ULTRASOUND;  Surgeon: Garner Nash, DO;  Location: [place] [facility];  Service: Pulmonary;;   VIDEO BRONCHOSCOPY WITH RADIAL ENDOBRONCHIAL ULTRASOUND  09/14/2021   Procedure: RADIAL ENDOBRONCHIAL ULTRASOUND;  Surgeon: Garner Nash, DO;  Location: [place] [facility];  Service: Pulmonary;;   Social History:  reports that she quit smoking about 15 years ago. Her smoking use included cigarettes. She has a 3.75  pack-year smoking history. She has never used smokeless tobacco. She reports that she does not drink alcohol and does not use drugs. Family History:  Family History  Problem Relation Age of Onset   Diabetes Mother    Diabetes Sister    Diabetes Maternal Aunt    Cancer Maternal Uncle    Cancer Maternal Uncle    Cancer Maternal Uncle    Cancer Maternal Uncle    Cancer Maternal Uncle    Cancer Maternal Uncle    Cancer Maternal Uncle    Cancer Maternal Uncle    Cancer Maternal Uncle    Breast cancer Neg Hx      HOME MEDICATIONS: Allergies as of 11/30/2022   No Known Allergies      Medication List        Accurate as of November 30, 2022  4:40 PM. If you have any questions, ask your nurse or doctor.          acetaminophen 500 MG tablet Commonly known as: TYLENOL Take 1,000 mg by mouth every 6 (six) hours as needed.   amiodarone 200 MG tablet Commonly known as: PACERONE Take 200 mg by mouth daily.   apixaban 5 MG Tabs tablet Commonly known  asArne [place] Take 5 mg by mouth 2 (two) times daily.   BIOTIN PO Take 1 capsule by mouth daily.   calcium carbonate 1250 (500 Ca) MG tablet Commonly known as: OS-CAL - dosed in mg of elemental calcium Take 1 tablet by mouth daily with breakfast.   carvedilol 6.25 MG tablet Commonly known as: COREG Take 6.25 mg by mouth 2 (two) times daily with a meal.   cyanocobalamin 1000 MCG tablet Take 1,000 mcg by mouth daily.   digoxin 0.125 MG tablet Commonly known as: LANOXIN Take 0.125 mg by mouth daily.   ferrous sulfate 325 (65 FE) MG tablet Take 325 mg by mouth every other day.   FLAX SEED OIL PO Take 2 capsules by mouth daily.   furosemide 40 MG tablet Commonly known as: LASIX Take 40 mg by mouth daily.   Lancets Misc   levothyroxine 25 MCG tablet Commonly known as: SYNTHROID Take 1 tablet (25 mcg total) by mouth daily.   losartan 25 MG tablet Commonly known as: COZAAR Take 2 tablets by mouth daily.   metFORMIN 500  MG 24 hr tablet Commonly known as: GLUCOPHAGE-XR Take 1,000 mg by mouth daily with supper.   MULTIVITAMIN ADULT PO Take 1 tablet by mouth daily.   OneTouch Ultra test strip Generic drug: glucose blood USE 1 EACH (1 STRIP TOTAL) 3 (THREE) TIMES DAILY   rosuvastatin 10 MG tablet Commonly known as: CRESTOR Take 10 mg by mouth daily.   sodium chloride 0.65 % Soln nasal spray Commonly known as: OCEAN Place 1 spray into both nostrils daily as needed (Dry nose).   SYSTANE OP Place 1 drop into both eyes daily as needed (dry eyes).   Vitamin D3 25 MCG (1000 UT) capsule Generic drug: Cholecalciferol Take 1,000 Units by mouth daily.   zinc gluconate 50 MG tablet Take 50 mg by mouth daily.          OBJECTIVE:   PHYSICAL EXAM: VS:BP 130/78   Pulse 81   Wt 191 lb 3.2 oz (86.7 kg)   SpO2 95%   BMI 32.82 kg/m    EXAM: General: Pt appears well and is in NAD  Thyroid : Left thyroid nodule appreciated   Lungs: Clear with good BS bilat with no rales, rhonchi, or wheezes  Heart: Auscultation: RRR.  Abdomen: Normoactive bowel sounds, soft, nontender, without masses or organomegaly palpable  Extremities:  BL LE: No pretibial edema normal ROM and strength.  Mental Status: Judgment, insight: Intact Orientation: Oriented to time, place, and person Mood and affect: No depression, anxiety, or agitation     DATA REVIEWED:  Latest Reference Range & Units 11/30/22 08:08  Sodium 135 - 145 mEq/L 139  Potassium 3.5 - 5.1 mEq/L 4.4  Chloride 96 - 112 mEq/L 101  CO2 19 - 32 mEq/L 31  Glucose 70 - 99 mg/dL 143 (H)  BUN 6 - 23 mg/dL 14  Creatinine 0.40 - 1.20 mg/dL 0.78  Calcium 8.4 - 10.5 mg/dL 9.4  GFR >60.00 mL/min 77.49  VITD 30.00 - 100.00 ng/mL 19.32 (L)    Latest Reference Range & Units 11/30/22 08:08  TSH 0.35 - 5.50 uIU/mL 5.76 (H)  (H): Data is abnormally high    Thyroid Ultrasound 12/03/2021  Estimated total number of nodules >/= 1 cm: 2   Number of spongiform  nodules >/=  2 cm not described below (TR1): 0   Number of mixed cystic and solid nodules >/= 1.5 cm not described below ([place]): 0  _________________________________________________________   Nodule 1: 0.8 x 0.6 x 0.6 cm spongiform nodule in the superior right thyroid lobe does not meet criteria for imaging surveillance or FNA.   _________________________________________________________   Nodule # 2:   Prior biopsy: No   Location: Right; inferior   Maximum size: 1.2 cm; Other 2 dimensions: 0.7 x 0.8 cm, previously, 1.1 x 0.8 x 0.8 cm   Composition: solid/almost completely solid (2)   Echogenicity: isoechoic (1)   Shape: not taller-than-wide (0)   Margins: ill-defined (0)   Echogenic foci: none (0)   ACR TI-RADS total points: 3.   ACR TI-RADS risk category:  TR3 (3 points).   Significant change in size (>/= 20% in two dimensions and minimal increase of 2 mm): No   Change in features: No   Change in ACR TI-RADS risk category: No   ACR TI-RADS recommendations:   Given size (<1.4 cm) and appearance, this nodule does NOT meet TI-RADS criteria for biopsy or dedicated follow-up.   _________________________________________________________   Nodule 3: Previously biopsy solid isoechoic left thyroid nodule measuring 3.4 x 2.0 x 2.6 cm is not significantly changed in size from prior examination where it measured 2.8 x 2.2 x 2.4 cm. Please correlate with FNA results from 07/18/2020.   IMPRESSION: 1. Previously biopsied left inferior thyroid nodule is unchanged in size. Please correlate with FNA results from 07/18/2020. 2. Right thyroid nodules do not meet criteria for FNA or imaging surveillance.    FNA of the left  thyroid Nodule 07/18/2020  DIAGNOSIS:  A. THYROID GLAND, LEFT LOBE; ULTRASOUND-GUIDED FINE-NEEDLE ASPIRATION:  - BENIGN (BETHESDA CATEGORY II).   PET scan 11/02/2021 NECK: Left thyroid nodules including a 1.6 cm hypodense nodule and a 1.5 cm  hypodense nodule, with metabolic activity similar to the rest of the thyroid gland.   Incidental CT findings: Bilateral common carotid atherosclerotic calcification.   CHEST: Fiducial near the dominant right lower lobe nodule sitting just anterior to the major fissure. This sub solid nodule measures 1.3 by 1.0 cm on image 64 series 4 has a maximum SUV of 2.1.   The subpleural 1.5 by 0.9 cm nodule at the right lung apex has a maximum SUV of 1.7. The remaining sub solid nodules are too small to characterize.   Low-grade activity along the hazy bilateral nonfocal subpleural reticulation primarily in the lower lobes.   Incidental CT findings: Coronary, aortic arch, and branch vessel atherosclerotic vascular disease. Mild cardiomegaly.   ABDOMEN/PELVIS: Lobulated left adrenal mass 2.4 by 1.5 cm on image 103 of series 4, previously 2.6 by 1.6 cm back on 01/09/2020, with only low-grade metabolic activity, maximum SUV 3.5 (the contralateral morphologically normal appearing right adrenal gland has a maximum SUV of 3.7). This is probably an adenoma based on prior absolute washout of 67%. Current noncontrast density measurements are nonspecific.   Widespread accentuated bowel activity is likely physiologic.   Incidental CT findings: Cholecystectomy. Nonobstructive nephrolithiasis on the right. Residual contrast in the renal collecting systems and urinary bladder. Atherosclerosis is present, including aortoiliac atherosclerotic disease.   SKELETON: No significant abnormal hypermetabolic activity in this region.   Incidental CT findings: Mild levoconvex lumbar scoliosis.   IMPRESSION: 1. The dominant subsolid right lower lobe nodule just anterior to the major fissure has a maximum SUV of 2.1, compatible with low-grade adenocarcinoma based on positive biopsy result. The subpleural 1.5 by 0.9 cm nodule at the right lung apex has a maximum SUV of 1.7. The other sub solid pulmonary  nodules  are too small to characterize. 2. Low-grade activity along hazy bilateral nonfocal subpleural reticulations in the lower lobes, likely inflammatory. 3. Lobulated left adrenal mass is not hypermetabolic compared to the contralateral morphologically normal right adrenal gland, and is probably an adenoma based on current and prior imaging. 4. Left thyroid nodules including a 1.6 cm nodule. These were worked up on prior thyroid ultrasound of 06/25/2020 and biopsy of 07/18/2020, which was benign. This has been evaluated on previous imaging. (ref: J Am Coll Radiol. 2015 Feb;12(2): 143-50). 5. Other imaging findings of potential clinical significance: Aortic Atherosclerosis (ICD10-I70.0). Coronary atherosclerosis. Mild cardiomegaly. Nonobstructive nephrolithiasis. Levoconvex lumbar scoliosis.     ASSESSMENT / PLAN / RECOMMENDATIONS:    Adrenal Adenoma:    - B/L adrenal adenomas , stable PET scan 10/2021,  during evaluation for adenocarcinoma of the lung and adrenal adenoma was stable at 2.4 x 1.5 cm -Renin and aldo and  urinary catecholamines have been normal 04/2022     2. Multinodular Goiter:    - Pt is clinically euthyroid  - No local neck symptoms - S/P benign FNA of the left thyroid nodule 06/2020 -We will proceed with repeat thyroid ultrasound     3.  Elevated Cortisol :   -Patient was noted with elevated 24-hour urinary cortisol at 57.1 mcg(reference 50) in April 2023 Her dexamethasone suppression test also came back elevated at 2.9 (reference 1.8) -Previous ACTH was low-normal level -Repeat 24-hr urine cortisol was low at 2 ug (04/2022) with one of the salivary cortisol elevated at 0.105 04/2022, ACTH normal at 13 pg/mL  - Repeat 24-hr urine cortisol was normal 18 mcg 09/2022 -I suspect this was false positive given treatment for cancer and receiving radiation therapy close to the initial time of testing -There is no clinical evidence of Cushing syndrome -We will  continue to check annually    4. Subclinical Hypothyroidism:  -Patient on amiodarone chronically -She held biotin appropriately -TSH have improved but continues to be above goal, will increase levothyroxine as below  Medication Stop levothyroxine 25 mcg daily Start levothyroxine 50 mcg daily   5.  Vitamin D deficiency:  -Will start ergocalciferol 50,000 IU weekly  F/U in 6 months     Signed electronically by: Mack Guise, MD  [facility]  [facility] [address] [place], [place] [address] Phone: [phone] FAX: [phone]      CC: [place], Melanie Crazier, [place] [address] Phone: [phone]  Fax: [phone]   Return to Endocrinology clinic as below: Future Appointments  Date Time Provider [place]  12/06/2022  3:00 PM [facility] None  12/06/2022  4:00 PM [facility] 2 [facility] [place]  12/08/2022 11:15 AM Curt Bears, MD [facility] None  06/02/2023  7:30 AM Bucky Grigg, Melanie Crazier, MD [facility] None

## 2022-11-30 ENCOUNTER — Encounter: Payer: Self-pay | Admitting: Internal Medicine

## 2022-11-30 ENCOUNTER — Ambulatory Visit (INDEPENDENT_AMBULATORY_CARE_PROVIDER_SITE_OTHER): Payer: Medicare HMO | Admitting: Internal Medicine

## 2022-11-30 VITALS — BP 130/78 | HR 81 | Wt 191.2 lb

## 2022-11-30 DIAGNOSIS — D3500 Benign neoplasm of unspecified adrenal gland: Secondary | ICD-10-CM

## 2022-11-30 DIAGNOSIS — E042 Nontoxic multinodular goiter: Secondary | ICD-10-CM

## 2022-11-30 DIAGNOSIS — E039 Hypothyroidism, unspecified: Secondary | ICD-10-CM | POA: Diagnosis not present

## 2022-11-30 DIAGNOSIS — E559 Vitamin D deficiency, unspecified: Secondary | ICD-10-CM | POA: Diagnosis not present

## 2022-11-30 LAB — BASIC METABOLIC PANEL
BUN: 14 mg/dL (ref 6–23)
CO2: 31 mEq/L (ref 19–32)
Calcium: 9.4 mg/dL (ref 8.4–10.5)
Chloride: 101 mEq/L (ref 96–112)
Creatinine, Ser: 0.78 mg/dL (ref 0.40–1.20)
GFR: 77.49 mL/min (ref >60.00)
Glucose, Bld: 143 mg/dL — ABNORMAL HIGH (ref 70–99)
Potassium: 4.4 mEq/L (ref 3.5–5.1)
Sodium: 139 mEq/L (ref 135–145)

## 2022-11-30 LAB — TSH: TSH: 5.76 u[IU]/mL — ABNORMAL HIGH (ref 0.35–5.50)

## 2022-11-30 LAB — VITAMIN D 25 HYDROXY (VIT D DEFICIENCY, FRACTURES): VITD: 19.32 ng/mL — ABNORMAL LOW (ref 30.00–100.00)

## 2022-11-30 MED ORDER — LEVOTHYROXINE SODIUM 50 MCG PO TABS: 50.0000 ug | ORAL_TABLET | Freq: Every day | ORAL | 3 refills | Status: DC

## 2022-11-30 MED ORDER — VITAMIN D (ERGOCALCIFEROL) 1.25 MG (50000 UNIT) PO CAPS: 50000.0000 [IU] | ORAL_CAPSULE | ORAL | 3 refills | Status: DC

## 2022-11-30 NOTE — Patient Instructions (Signed)

## 2022-12-06 ENCOUNTER — Inpatient Hospital Stay: Payer: Medicare HMO | Attending: Internal Medicine

## 2022-12-06 ENCOUNTER — Ambulatory Visit (HOSPITAL_COMMUNITY)
Admission: RE | Admit: 2022-12-06 | Discharge: 2022-12-06 | Disposition: A | Discharge status: Home / Self Care | Payer: Medicare HMO | Source: Ambulatory Visit | Attending: Internal Medicine | Admitting: Internal Medicine

## 2022-12-06 DIAGNOSIS — E119 Type 2 diabetes mellitus without complications: Secondary | ICD-10-CM | POA: Insufficient documentation

## 2022-12-06 DIAGNOSIS — C3412 Malignant neoplasm of upper lobe, left bronchus or lung: Secondary | ICD-10-CM | POA: Insufficient documentation

## 2022-12-06 DIAGNOSIS — C3411 Malignant neoplasm of upper lobe, right bronchus or lung: Secondary | ICD-10-CM | POA: Insufficient documentation

## 2022-12-06 DIAGNOSIS — C3490 Malignant neoplasm of unspecified part of unspecified bronchus or lung: Secondary | ICD-10-CM | POA: Diagnosis present

## 2022-12-06 DIAGNOSIS — I110 Hypertensive heart disease with heart failure: Secondary | ICD-10-CM | POA: Insufficient documentation

## 2022-12-06 DIAGNOSIS — Z90710 Acquired absence of both cervix and uterus: Secondary | ICD-10-CM | POA: Insufficient documentation

## 2022-12-06 DIAGNOSIS — I509 Heart failure, unspecified: Secondary | ICD-10-CM | POA: Insufficient documentation

## 2022-12-06 LAB — CBC WITH DIFFERENTIAL (CANCER CENTER ONLY)
Abs Immature Granulocytes: 0.03 10*3/uL (ref 0.00–0.07)
Basophils Absolute: 0.1 10*3/uL (ref 0.0–0.1)
Basophils Relative: 1 %
Eosinophils Absolute: 0.1 10*3/uL (ref 0.0–0.5)
Eosinophils Relative: 1 %
HCT: 41.7 % (ref 36.0–46.0)
Hemoglobin: 13.3 g/dL (ref 12.0–15.0)
Immature Granulocytes: 0 %
Lymphocytes Relative: 20 %
Lymphs Abs: 1.7 10*3/uL (ref 0.7–4.0)
MCH: 27.7 pg (ref 26.0–34.0)
MCHC: 31.9 g/dL (ref 30.0–36.0)
MCV: 86.7 fL (ref 80.0–100.0)
Monocytes Absolute: 0.8 10*3/uL (ref 0.1–1.0)
Monocytes Relative: 9 %
Neutro Abs: 5.9 10*3/uL (ref 1.7–7.7)
Neutrophils Relative %: 69 %
Platelet Count: 287 10*3/uL (ref 150–400)
RBC: 4.81 MIL/uL (ref 3.87–5.11)
RDW: 15.9 % — ABNORMAL HIGH (ref 11.5–15.5)
WBC Count: 8.7 10*3/uL (ref 4.0–10.5)
nRBC: 0 % (ref 0.0–0.2)

## 2022-12-06 LAB — CMP (CANCER CENTER ONLY)
ALT: 22 U/L (ref 0–44)
AST: 19 U/L (ref 15–41)
Albumin: 4.2 g/dL (ref 3.5–5.0)
Alkaline Phosphatase: 81 U/L (ref 38–126)
Anion gap: 8 (ref 5–15)
BUN: 14 mg/dL (ref 8–23)
CO2: 30 mmol/L (ref 22–32)
Calcium: 9.4 mg/dL (ref 8.9–10.3)
Chloride: 102 mmol/L (ref 98–111)
Creatinine: 0.76 mg/dL (ref 0.44–1.00)
GFR, Estimated: >60 mL/min (ref >60)
Glucose, Bld: 85 mg/dL (ref 70–99)
Potassium: 4.2 mmol/L (ref 3.5–5.1)
Sodium: 140 mmol/L (ref 135–145)
Total Bilirubin: 0.5 mg/dL (ref 0.3–1.2)
Total Protein: 7.4 g/dL (ref 6.5–8.1)

## 2022-12-06 MED ORDER — IOHEXOL 300 MG/ML  SOLN
75.0000 mL | Freq: Once | INTRAMUSCULAR | Status: AC | PRN
  Administered 2022-12-06: 75 mL via INTRAVENOUS

## 2022-12-07 LAB — ALDOSTERONE + RENIN ACTIVITY W/ RATIO
Aldos/Renin Ratio: 12.6 (ref 0.0–30.0)
Aldosterone: 3.7 ng/dL (ref 0.0–30.0)
Renin Activity, Plasma: 0.293 ng/mL/hr (ref 0.167–5.380)

## 2022-12-08 ENCOUNTER — Inpatient Hospital Stay: Payer: Medicare HMO | Admitting: Internal Medicine

## 2022-12-08 VITALS — BP 160/63 | HR 73 | Temp 97.8°F | Resp 14 | Wt 190.9 lb

## 2022-12-08 DIAGNOSIS — Z90710 Acquired absence of both cervix and uterus: Secondary | ICD-10-CM | POA: Diagnosis not present

## 2022-12-08 DIAGNOSIS — C3490 Malignant neoplasm of unspecified part of unspecified bronchus or lung: Secondary | ICD-10-CM

## 2022-12-08 DIAGNOSIS — C3411 Malignant neoplasm of upper lobe, right bronchus or lung: Secondary | ICD-10-CM | POA: Diagnosis present

## 2022-12-08 DIAGNOSIS — I509 Heart failure, unspecified: Secondary | ICD-10-CM | POA: Diagnosis not present

## 2022-12-08 DIAGNOSIS — C3412 Malignant neoplasm of upper lobe, left bronchus or lung: Secondary | ICD-10-CM | POA: Diagnosis not present

## 2022-12-08 DIAGNOSIS — I110 Hypertensive heart disease with heart failure: Secondary | ICD-10-CM | POA: Diagnosis not present

## 2022-12-08 DIAGNOSIS — E119 Type 2 diabetes mellitus without complications: Secondary | ICD-10-CM | POA: Diagnosis not present

## 2022-12-08 NOTE — Progress Notes (Signed)
[facility] [phone]   [phone]  OFFICE PROGRESS NOTE  Jerl MinaHedrick, James, MD 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 Molecular Study by [facility]: Positive EGFR V769M, G719S PDL1 Expression: 0%  PRIOR THERAPY: SBRT to the right upper lobe lung nodule under the care of Dr. Mitzi HansenMoody  CURRENT THERAPY: Observation  INTERVAL HISTORY: Dorothy Gross 72 y.o. female returns to the clinic today for 6 months follow-up visit accompanied by her daughter, Morrie Sheldonshley.  The patient is feeling fine today with no concerning complaints.  She denied having any current chest pain, shortness of breath, cough or hemoptysis.  She has no nausea, vomiting, diarrhea or constipation.  She has no headache or visual changes.  She denied having any fever or chills.  She is here today for evaluation with repeat CT scan of the chest for restaging of her disease.  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  MEDICATIONS:  Current Outpatient Medications  Medication Sig Dispense Refill   acetaminophen (TYLENOL) 500 MG tablet Take 1,000 mg by mouth every 6 (six) hours as needed.     amiodarone (PACERONE) 200 MG tablet Take 200 mg by mouth daily.      apixaban (ELIQUIS) 5 MG TABS tablet Take 5 mg by mouth 2 (two) times daily.      BIOTIN PO Take 1 capsule by mouth daily.     calcium carbonate (OS-CAL - DOSED IN MG OF ELEMENTAL CALCIUM) 1250 (500 Ca) MG tablet  Take 1 tablet by mouth daily with breakfast.     carvedilol (COREG) 6.25 MG tablet Take 6.25 mg by mouth 2 (two) times daily with a meal.      Cholecalciferol (VITAMIN D3) 1000 units CAPS Take 1,000 Units by mouth daily.      cyanocobalamin 1000 MCG tablet Take 1,000 mcg by mouth daily.      digoxin (LANOXIN) 0.125 MG tablet Take 0.125 mg by mouth daily.      ferrous sulfate 325 (65 FE) MG tablet Take 325 mg by mouth every other day.     Flaxseed, Linseed, (FLAX SEED OIL PO) Take 2 capsules by mouth daily.     furosemide (LASIX) 40 MG tablet Take 40 mg by mouth daily.      glucose blood (ONETOUCH ULTRA) test strip USE 1 EACH (1 STRIP TOTAL) 3 (THREE) TIMES DAILY     Lancets MISC      levothyroxine (SYNTHROID) 50 MCG tablet Take 1 tablet (50 mcg total) by mouth daily. 90 tablet 3   losartan (COZAAR) 25 MG tablet Take 2 tablets by mouth daily.     metFORMIN (GLUCOPHAGE-XR) 500 MG 24 hr tablet Take 1,000 mg by mouth daily with supper.     Multiple Vitamins-Minerals (MULTIVITAMIN ADULT PO) Take 1 tablet by mouth daily.     Polyethyl Glycol-Propyl Glycol (SYSTANE OP) Place 1 drop into both eyes  daily as needed (dry eyes).     rosuvastatin (CRESTOR) 10 MG tablet Take 10 mg by mouth daily.      sodium chloride (OCEAN) 0.65 % SOLN nasal spray Place 1 spray into both nostrils daily as needed (Dry nose).     Vitamin D, Ergocalciferol, (DRISDOL) 1.25 MG (50000 UNIT) CAPS capsule Take 1 capsule (50,000 Units total) by mouth every 7 (seven) days. 12 capsule 3   zinc gluconate 50 MG tablet Take 50 mg by mouth daily.      No current facility-administered medications for this visit.    SURGICAL HISTORY:  Past Surgical History:  Procedure Laterality Date   ABDOMINAL HYSTERECTOMY     APPENDECTOMY     BREAST EXCISIONAL BIOPSY Right 1610,9604   negative 1974 negative 2015   BRONCHIAL BIOPSY  03/23/2021   Procedure: BRONCHIAL BIOPSIES;  Surgeon: Josephine Igo, DO;  Location: [facility];  Service:  Pulmonary;;   BRONCHIAL BIOPSY  09/14/2021   Procedure: BRONCHIAL BIOPSIES;  Surgeon: Josephine Igo, DO;  Location: [facility];  Service: Pulmonary;;   BRONCHIAL BRUSHINGS  03/23/2021   Procedure: BRONCHIAL BRUSHINGS;  Surgeon: Josephine Igo, DO;  Location: [facility];  Service: Pulmonary;;   BRONCHIAL BRUSHINGS  09/14/2021   Procedure: BRONCHIAL BRUSHINGS;  Surgeon: Josephine Igo, DO;  Location: [facility];  Service: Pulmonary;;   BRONCHIAL NEEDLE ASPIRATION BIOPSY  09/14/2021   Procedure: BRONCHIAL NEEDLE ASPIRATION BIOPSIES;  Surgeon: Josephine Igo, DO;  Location: [facility];  Service: Pulmonary;;   BRONCHIAL WASHINGS  03/23/2021   Procedure: BRONCHIAL WASHINGS;  Surgeon: Josephine Igo, DO;  Location: [facility];  Service: Pulmonary;;   CHOLECYSTECTOMY N/A 12/05/2015   Procedure: LAPAROSCOPIC CHOLECYSTECTOMY WITH INTRAOPERATIVE CHOLANGIOGRAM;  Surgeon: Nadeen Landau, MD;  Location: [facility];  Service: General;  Laterality: N/A;   COLONOSCOPY WITH PROPOFOL N/A 10/18/2016   Procedure: COLONOSCOPY WITH PROPOFOL;  Surgeon: Scot Jun, MD;  Location: [facility] ENDOSCOPY;  Service: Endoscopy;  Laterality: N/A;   COLONOSCOPY WITH PROPOFOL N/A 03/17/2018   Procedure: COLONOSCOPY WITH PROPOFOL;  Surgeon: Scot Jun, MD;  Location: [facility] ENDOSCOPY;  Service: Endoscopy;  Laterality: N/A;   CYSTOSCOPY W/ RETROGRADES Bilateral 03/07/2020   Procedure: CYSTOSCOPY WITH RETROGRADE PYELOGRAM;  Surgeon: Sondra Come, MD;  Location: [facility];  Service: Urology;  Laterality: Bilateral;   CYSTOSCOPY/URETEROSCOPY/HOLMIUM LASER/STENT PLACEMENT Bilateral 03/07/2020   Procedure: CYSTOSCOPY/URETEROSCOPY/HOLMIUM LASER/STENT PLACEMENT;  Surgeon: Sondra Come, MD;  Location: [facility];  Service: Urology;  Laterality: Bilateral;   FIDUCIAL MARKER PLACEMENT  03/23/2021   Procedure: FIDUCIAL MARKER PLACEMENT;  Surgeon: Josephine Igo, DO;  Location: [facility];  Service: Pulmonary;;    JOINT REPLACEMENT     both knees   REPLACEMENT TOTAL KNEE BILATERAL     TONSILLECTOMY     VIDEO BRONCHOSCOPY WITH ENDOBRONCHIAL NAVIGATION Right 03/23/2021   Procedure: VIDEO BRONCHOSCOPY WITH ENDOBRONCHIAL NAVIGATION;  Surgeon: Josephine Igo, DO;  Location: [facility];  Service: Pulmonary;  Laterality: Right;  ION Case    VIDEO BRONCHOSCOPY WITH RADIAL ENDOBRONCHIAL ULTRASOUND  03/23/2021   Procedure: VIDEO BRONCHOSCOPY WITH RADIAL ENDOBRONCHIAL ULTRASOUND;  Surgeon: Josephine Igo, DO;  Location: [facility];  Service: Pulmonary;;   VIDEO BRONCHOSCOPY WITH RADIAL ENDOBRONCHIAL ULTRASOUND  09/14/2021   Procedure: RADIAL ENDOBRONCHIAL ULTRASOUND;  Surgeon: Josephine Igo, DO;  Location: [facility];  Service: Pulmonary;;    REVIEW OF SYSTEMS:  A comprehensive review of systems was negative.   PHYSICAL EXAMINATION: General appearance: alert, cooperative,  and no distress Head: Normocephalic, without obvious abnormality, atraumatic Neck: no adenopathy, no JVD, supple, symmetrical, trachea midline, and thyroid not enlarged, symmetric, no tenderness/mass/nodules Lymph nodes: Cervical, supraclavicular, and axillary nodes normal. Resp: clear to auscultation bilaterally Back: symmetric, no curvature. ROM normal. No CVA tenderness. Cardio: regular rate and rhythm, S1, S2 normal, no murmur, click, rub or gallop GI: soft, non-tender; bowel sounds normal; no masses,  no organomegaly Extremities: extremities normal, atraumatic, no cyanosis or edema  ECOG PERFORMANCE STATUS: 1 - Symptomatic but completely ambulatory  Blood pressure (!) 160/63, pulse 73, temperature 97.8 F (36.6 C), temperature source Oral, resp. rate 14, weight 190 lb 14.4 oz (86.6 kg), SpO2 94 %.  LABORATORY DATA: Lab Results  Component Value Date   WBC 8.7 12/06/2022   HGB 13.3 12/06/2022   HCT 41.7 12/06/2022   MCV 86.7 12/06/2022   PLT 287 12/06/2022      Chemistry      Component Value Date/Time   NA 140  12/06/2022 1432   NA 143 05/14/2022 0832   NA 136 02/12/2013 0606   K 4.2 12/06/2022 1432   K 4.4 02/12/2013 0606   CL 102 12/06/2022 1432   CL 101 02/12/2013 0606   CO2 30 12/06/2022 1432   CO2 30 02/12/2013 0606   BUN 14 12/06/2022 1432   BUN 18 05/14/2022 0832   BUN 25 (H) 02/12/2013 0606   CREATININE 0.76 12/06/2022 1432   CREATININE 0.94 02/12/2013 0606      Component Value Date/Time   CALCIUM 9.4 12/06/2022 1432   CALCIUM 8.8 02/12/2013 0606   ALKPHOS 81 12/06/2022 1432   AST 19 12/06/2022 1432   ALT 22 12/06/2022 1432   BILITOT 0.5 12/06/2022 1432       RADIOGRAPHIC STUDIES: CT Chest W Contrast  Result Date: 12/07/2022 CLINICAL DATA:  Non-small cell lung cancer; * Tracking Code: BO * EXAM: CT CHEST WITH CONTRAST TECHNIQUE: Multidetector CT imaging of the chest was performed during intravenous contrast administration. RADIATION DOSE REDUCTION: This exam was performed according to the departmental dose-optimization program which includes automated exposure control, adjustment of the mA and/or kV according to patient size and/or use of iterative reconstruction technique. CONTRAST:  75mL OMNIPAQUE IOHEXOL 300 MG/ML  SOLN COMPARISON:  Multiple priors, most recent chest CT dated June 04, 2022 FINDINGS: Cardiovascular: Cardiomegaly. No pericardial effusion. Normal caliber thoracic aorta with moderate atherosclerotic disease. Severe coronary artery calcifications. Mediastinum/Nodes: Esophagus and thyroid are unremarkable. No enlarged lymph nodes seen in the chest. Lungs/Pleura: Central airways are patent. Stable right upper lobe postradiation change. Left upper lobe ground-glass nodule is increased in size when compared with the prior exam measuring 12 x 8 mm, previously 8 x 6 mm. Other previously seen ground-glass nodules are stable. Reference 8 mm nodule of the right lower lobe located on series 7, image 82, unchanged. Reference 10 mm nodule of the left upper lobe located on series 7,  image 41, unchanged. No pleural effusion. Upper Abdomen: Unchanged nodular thickening of the left adrenal gland. No acute abnormality. Musculoskeletal: No chest wall abnormality. No acute or significant osseous findings. IMPRESSION: 1. Stable right upper lobe postradiation change. No evidence of local recurrence. 2. Left upper lobe ground-glass nodule is increased in size when compared with the prior exam, concerning for multifocal adenocarcinoma. 3. Other previously seen subsolid nodules are stable. 4. Aortic Atherosclerosis (ICD10-I70.0). Electronically Signed   By: Allegra Lai M.D.   On: 12/07/2022 21:57    ASSESSMENT AND PLAN: This is a  very pleasant 72 years old white female with multifocal adenocarcinoma involving the right and left lung mainly presented with  biopsy confirmed right upper lobe lesion diagnosed in January 2023. Molecular Study by [facility] showed Positive EGFR V769M, G719S PDL1 Expression: 0% The patient is status post SBRT to right upper lobe lung nodule under the care of Dr. Mitzi Hansen. The patient is currently on observation and she is feeling fine with no concerning complaints. She had repeat CT scan of the chest performed recently.  I personally and independently reviewed the scan images and discussed the results with the patient and her daughter. Her scan showed a stable pulmonary nodules and as well as postradiation changes in the right upper lobe but there was increased size and a left upper lobe groundglass nodule suspicious for progressive disease in that area. I recommended for the patient to see Dr. Mitzi Hansen for consideration of SBRT to the enlarging left upper lobe lung nodule. I will continue the patient on observation with repeat CT scan of the chest in 6 months. For the hypertension she was advised to take her blood pressure medication as prescribed and to monitor it closely at home. She was advised to call immediately if she has any other concerning symptoms  in the interval. The patient voices understanding of current disease status and treatment options and is in agreement with the current care plan.  All questions were answered. The patient knows to call the clinic with any problems, questions or concerns. We can certainly see the patient much sooner if necessary. The total time spent in the appointment was 20 minutes.  Disclaimer: This note was dictated with voice recognition software. Similar sounding words can inadvertently be transcribed and may not be corrected upon review.

## 2022-12-13 DIAGNOSIS — C3412 Malignant neoplasm of upper lobe, left bronchus or lung: Secondary | ICD-10-CM | POA: Insufficient documentation

## 2022-12-13 NOTE — Progress Notes (Signed)
Radiation Oncology         [phone] ________________________________  Name: Dorothy Gross        MRN: [id number]  Date of Service: 12/16/2022 DOB: 1951-07-23  JX:[id number], Dorothy Fearing, MD  Si Gaul, MD     REFERRING PHYSICIAN: Si Gaul, MD   DIAGNOSIS: The encounter diagnosis was Malignant neoplasm of upper lobe of right lung.   HISTORY OF PRESENT ILLNESS: Dorothy Gross is a 72 y.o. female with a history of Multifocal Stage IA2, cT1bN0M0, NSCLC, adenocarcinoma, EGFR mutated of the RUL.  Patient was originally noted to have multiple bilateral pulmonary nodules in the summer 2022 and workup showed 2 targets in the right upper lobe.  She underwent bronchoscopy on 03/23/2021 and multiple brushings and biopsies of the 2 targets in the right upper lobe could not confirm malignancy but the second target showed atypia.  She was followed closely and repeat CTs in November 2022 again showed multiple groundglass pulmonary nodules of varying sizes and solidity but the most worrisome in the right upper lobe along the major fissure measured up to 1.8 cm and posteriorly measured 1.5 cm and was felt to be unchanged.  No evidence of adenopathy was noted.  She underwent repeat bronchoscopy on 09/14/2021, final cytologic assessment showed malignant cells in the fine-needle aspirate of the primary target no malignancy in the brushing, and target 2 again showed atypia.  Given these findings and her history of atrial fibrillation coronary artery disease cardiomyopathy and congestive heart failure complicated by diabetes and hypertension, she was not felt to be an appropriate surgical candidate.   Her case was discussed in multidisciplinary thoracic oncology conference, and it was concluded that she had multifocal Stage I disease.  She went on to receive SBRT to the 2 right upper lobe lesions with plans for close follow-up of the lesion in the left lung. Her most recent CT of the chest on 12/06/2022  showed stable postradiation changes in the areas of the right upper lobe that were treated.  In the left upper lobe, groundglass changes have increased, measuring 12 mm in comparison to 8 mm, a right lower lobe nodule is unchanged, and another 10 mm nodule in the left upper lobe is unchanged. Given this change, she's seen to consider additional SBRT to the LUL nodule.     PREVIOUS RADIATION THERAPY:    10/13/2021 through 10/23/2021 SBRT Treatment Site Technique Total Dose (Gy) Dose per Fx (Gy) Completed Fx Beam Energies  Lung, Right:  SBRT to Two RUL targets IMRT 60/60 12 5/5 6XFFF     PAST MEDICAL HISTORY:  Past Medical History:  Diagnosis Date   A-fib    Arthritis    CAD (coronary artery disease)    Cancer    squamous cell on forehead   Cardiomyopathy    CHF (congestive heart failure)    Diabetes mellitus without complication    History of kidney stones    Hyperlipidemia    Hypertension    Lung cancer 09/14/2021       PAST SURGICAL HISTORY: Past Surgical History:  Procedure Laterality Date   ABDOMINAL HYSTERECTOMY     APPENDECTOMY     BREAST EXCISIONAL BIOPSY Right 5621,3086   negative 1974 negative 2015   BRONCHIAL BIOPSY  03/23/2021   Procedure: BRONCHIAL BIOPSIES;  Surgeon: Josephine Igo, DO;  Location: [facility];  Service: Pulmonary;;   BRONCHIAL BIOPSY  09/14/2021   Procedure: BRONCHIAL BIOPSIES;  Surgeon: Josephine Igo, DO;  Location: [facility];  Service: Pulmonary;;   BRONCHIAL BRUSHINGS  03/23/2021   Procedure: BRONCHIAL BRUSHINGS;  Surgeon: Josephine Igo, DO;  Location: [facility];  Service: Pulmonary;;   BRONCHIAL BRUSHINGS  09/14/2021   Procedure: BRONCHIAL BRUSHINGS;  Surgeon: Josephine Igo, DO;  Location: [facility];  Service: Pulmonary;;   BRONCHIAL NEEDLE ASPIRATION BIOPSY  09/14/2021   Procedure: BRONCHIAL NEEDLE ASPIRATION BIOPSIES;  Surgeon: Josephine Igo, DO;  Location: [facility];  Service: Pulmonary;;   BRONCHIAL WASHINGS   03/23/2021   Procedure: BRONCHIAL WASHINGS;  Surgeon: Josephine Igo, DO;  Location: [facility];  Service: Pulmonary;;   CHOLECYSTECTOMY N/A 12/05/2015   Procedure: LAPAROSCOPIC CHOLECYSTECTOMY WITH INTRAOPERATIVE CHOLANGIOGRAM;  Surgeon: Nadeen Landau, MD;  Location: [facility];  Service: General;  Laterality: N/A;   COLONOSCOPY WITH PROPOFOL N/A 10/18/2016   Procedure: COLONOSCOPY WITH PROPOFOL;  Surgeon: Scot Jun, MD;  Location: [facility];  Service: Endoscopy;  Laterality: N/A;   COLONOSCOPY WITH PROPOFOL N/A 03/17/2018   Procedure: COLONOSCOPY WITH PROPOFOL;  Surgeon: Scot Jun, MD;  Location: [facility];  Service: Endoscopy;  Laterality: N/A;   CYSTOSCOPY W/ RETROGRADES Bilateral 03/07/2020   Procedure: CYSTOSCOPY WITH RETROGRADE PYELOGRAM;  Surgeon: Sondra Come, MD;  Location: [facility];  Service: Urology;  Laterality: Bilateral;   CYSTOSCOPY/URETEROSCOPY/HOLMIUM LASER/STENT PLACEMENT Bilateral 03/07/2020   Procedure: CYSTOSCOPY/URETEROSCOPY/HOLMIUM LASER/STENT PLACEMENT;  Surgeon: Sondra Come, MD;  Location: [facility];  Service: Urology;  Laterality: Bilateral;   FIDUCIAL MARKER PLACEMENT  03/23/2021   Procedure: FIDUCIAL MARKER PLACEMENT;  Surgeon: Josephine Igo, DO;  Location: [facility];  Service: Pulmonary;;   JOINT REPLACEMENT     both knees   REPLACEMENT TOTAL KNEE BILATERAL     TONSILLECTOMY     VIDEO BRONCHOSCOPY WITH ENDOBRONCHIAL NAVIGATION Right 03/23/2021   Procedure: VIDEO BRONCHOSCOPY WITH ENDOBRONCHIAL NAVIGATION;  Surgeon: Josephine Igo, DO;  Location: [facility];  Service: Pulmonary;  Laterality: Right;  ION Case    VIDEO BRONCHOSCOPY WITH RADIAL ENDOBRONCHIAL ULTRASOUND  03/23/2021   Procedure: VIDEO BRONCHOSCOPY WITH RADIAL ENDOBRONCHIAL ULTRASOUND;  Surgeon: Josephine Igo, DO;  Location: [facility];  Service: Pulmonary;;   VIDEO BRONCHOSCOPY WITH RADIAL ENDOBRONCHIAL ULTRASOUND  09/14/2021   Procedure: RADIAL ENDOBRONCHIAL  ULTRASOUND;  Surgeon: Josephine Igo, DO;  Location: [facility];  Service: Pulmonary;;     FAMILY HISTORY:  Family History  Problem Relation Age of Onset   Diabetes Mother    Diabetes Sister    Diabetes Maternal Aunt    Cancer Maternal Uncle    Cancer Maternal Uncle    Cancer Maternal Uncle    Cancer Maternal Uncle    Cancer Maternal Uncle    Cancer Maternal Uncle    Cancer Maternal Uncle    Cancer Maternal Uncle    Cancer Maternal Uncle    Breast cancer Neg Hx      SOCIAL HISTORY:  reports that she quit smoking about 15 years ago. Her smoking use included cigarettes. She has a 3.75 pack-year smoking history. She has never used smokeless tobacco. She reports that she does not drink alcohol and does not use drugs. The patient is divorced and lives in [place]. She is accompanied by her son. 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   ALLERGIES: Patient has no known allergies.   MEDICATIONS:  Current Outpatient Medications  Medication Sig Dispense Refill   acetaminophen (TYLENOL) 500 MG tablet Take 1,000  mg by mouth every 6 (six) hours as needed.     amiodarone (PACERONE) 200 MG tablet Take 200 mg by mouth daily.      apixaban (ELIQUIS) 5 MG TABS tablet Take 5 mg by mouth 2 (two) times daily.      BIOTIN PO Take 1 capsule by mouth daily.     calcium carbonate (OS-CAL - DOSED IN MG OF ELEMENTAL CALCIUM) 1250 (500 Ca) MG tablet Take 1 tablet by mouth daily with breakfast.     carvedilol (COREG) 6.25 MG tablet Take 6.25 mg by mouth 2 (two) times daily with a meal.      Cholecalciferol (VITAMIN D3) 1000 units CAPS Take 1,000 Units by mouth daily.      cyanocobalamin 1000 MCG tablet Take 1,000 mcg by mouth daily.      digoxin (LANOXIN) 0.125 MG tablet Take 0.125 mg by mouth daily.      ferrous sulfate 325 (65 FE) MG tablet Take 325 mg by mouth every other day.     Flaxseed,  Linseed, (FLAX SEED OIL PO) Take 2 capsules by mouth daily.     furosemide (LASIX) 40 MG tablet Take 40 mg by mouth daily.      glucose blood (ONETOUCH ULTRA) test strip USE 1 EACH (1 STRIP TOTAL) 3 (THREE) TIMES DAILY     Lancets MISC      levothyroxine (SYNTHROID) 50 MCG tablet Take 1 tablet (50 mcg total) by mouth daily. 90 tablet 3   losartan (COZAAR) 25 MG tablet Take 2 tablets by mouth daily.     metFORMIN (GLUCOPHAGE-XR) 500 MG 24 hr tablet Take 1,000 mg by mouth daily with supper.     Multiple Vitamins-Minerals (MULTIVITAMIN ADULT PO) Take 1 tablet by mouth daily.     Polyethyl Glycol-Propyl Glycol (SYSTANE OP) Place 1 drop into both eyes daily as needed (dry eyes).     rosuvastatin (CRESTOR) 10 MG tablet Take 10 mg by mouth daily.      sodium chloride (OCEAN) 0.65 % SOLN nasal spray Place 1 spray into both nostrils daily as needed (Dry nose).     Vitamin D, Ergocalciferol, (DRISDOL) 1.25 MG (50000 UNIT) CAPS capsule Take 1 capsule (50,000 Units total) by mouth every 7 (seven) days. 12 capsule 3   zinc gluconate 50 MG tablet Take 50 mg by mouth daily.      No current facility-administered medications for this visit.     REVIEW OF SYSTEMS: On review of systems, the patient reports that she is doing okay but navigating this diagnosis has been overwhelming. She reports she has not had significant changes in weight unintentionally. She denies any shortness of breath, but has had an occasional cough since her biopsy. No hemoptysis is reported. No other complaints are verbalized.    PHYSICAL EXAM:  Wt Readings from Last 3 Encounters:  12/08/22 190 lb 14.4 oz (86.6 kg)  11/30/22 191 lb 3.2 oz (86.7 kg)  09/28/22 189 lb (85.7 kg)   Temp Readings from Last 3 Encounters:  12/08/22 97.8 F (36.6 C) (Oral)  06/08/22 98.9 F (37.2 C) (Oral)  02/03/22 97.8 F (36.6 C) (Oral)   BP Readings from Last 3 Encounters:  12/08/22 (!) 160/63  11/30/22 130/78  09/28/22 (!) 140/76   Pulse  Readings from Last 3 Encounters:  12/08/22 73  11/30/22 81  09/28/22 70    /10  In general this is a well appearing caucasian female in no acute distress. She's alert and oriented x4 and appropriate  throughout the examination. Cardiopulmonary assessment is negative for acute distress and she exhibits normal effort.    ECOG = 0  0 - Asymptomatic (Fully active, able to carry on all predisease activities without restriction)  1 - Symptomatic but completely ambulatory (Restricted in physically strenuous activity but ambulatory and able to carry out work of a light or sedentary nature. For example, light housework, office work)  2 - Symptomatic, <50% in bed during the day (Ambulatory and capable of all self care but unable to carry out any work activities. Up and about more than 50% of waking hours)  3 - Symptomatic, >50% in bed, but not bedbound (Capable of only limited self-care, confined to bed or chair 50% or more of waking hours)  4 - Bedbound (Completely disabled. Cannot carry on any self-care. Totally confined to bed or chair)  5 - Death   Santiago Glad MM, Creech RH, Tormey DC, et al. [phone]). "Toxicity and response criteria of the [facility]". Am. Evlyn Clines. Oncol. 5 (6): 649-55    LABORATORY DATA:  Lab Results  Component Value Date   WBC 8.7 12/06/2022   HGB 13.3 12/06/2022   HCT 41.7 12/06/2022   MCV 86.7 12/06/2022   PLT 287 12/06/2022   Lab Results  Component Value Date   NA 140 12/06/2022   K 4.2 12/06/2022   CL 102 12/06/2022   CO2 30 12/06/2022   Lab Results  Component Value Date   ALT 22 12/06/2022   AST 19 12/06/2022   ALKPHOS 81 12/06/2022   BILITOT 0.5 12/06/2022      RADIOGRAPHY: CT Chest W Contrast  Result Date: 12/07/2022 CLINICAL DATA:  Non-small cell lung cancer; * Tracking Code: BO * EXAM: CT CHEST WITH CONTRAST TECHNIQUE: Multidetector CT imaging of the chest was performed during intravenous contrast administration. RADIATION  DOSE REDUCTION: This exam was performed according to the departmental dose-optimization program which includes automated exposure control, adjustment of the mA and/or kV according to patient size and/or use of iterative reconstruction technique. CONTRAST:  75mL OMNIPAQUE IOHEXOL 300 MG/ML  SOLN COMPARISON:  Multiple priors, most recent chest CT dated June 04, 2022 FINDINGS: Cardiovascular: Cardiomegaly. No pericardial effusion. Normal caliber thoracic aorta with moderate atherosclerotic disease. Severe coronary artery calcifications. Mediastinum/Nodes: Esophagus and thyroid are unremarkable. No enlarged lymph nodes seen in the chest. Lungs/Pleura: Central airways are patent. Stable right upper lobe postradiation change. Left upper lobe ground-glass nodule is increased in size when compared with the prior exam measuring 12 x 8 mm, previously 8 x 6 mm. Other previously seen ground-glass nodules are stable. Reference 8 mm nodule of the right lower lobe located on series 7, image 82, unchanged. Reference 10 mm nodule of the left upper lobe located on series 7, image 41, unchanged. No pleural effusion. Upper Abdomen: Unchanged nodular thickening of the left adrenal gland. No acute abnormality. Musculoskeletal: No chest wall abnormality. No acute or significant osseous findings. IMPRESSION: 1. Stable right upper lobe postradiation change. No evidence of local recurrence. 2. Left upper lobe ground-glass nodule is increased in size when compared with the prior exam, concerning for multifocal adenocarcinoma. 3. Other previously seen subsolid nodules are stable. 4. Aortic Atherosclerosis (ICD10-I70.0). Electronically Signed   By: Allegra Lai M.D.   On: 12/07/2022 21:57       IMPRESSION/PLAN: 1. Putative Stage IA2, cT1bN0M0, NSCLC of the LUL. .Dr. Mitzi Hansen discusses the patient's course and recent imaging findings in the LUL. He recommends definitive SBRT to the nodule and  close surveillance as with her other history  of early stage lung cancer. We discussed the risks, benefits, short, and long term effects of radiotherapy, as well as the curative intent, and the patient is interested in proceeding. Dr. Mitzi Hansen discusses the delivery and logistics of radiotherapy and anticipates a course of 3-5 fractions of SBRT to the LUL. Written consent is obtained and placed in the chart, a copy was provided to the patient. She will simulate ***   2. Multifocal Stage IA2, cT1bN0M0, NSCLC, adenocarcinoma of the RUL nodules. She will continue in surveillance with Dr. Arbutus Ped.     In a visit lasting *** minutes, greater than 50% of the time was spent face to face discussing the patient's condition, in preparation for the discussion, and coordinating the patient's care.   The above documentation reflects my direct findings during this shared patient visit. Please see the separate note by Dr. Mitzi Hansen on this date for the remainder of the patient's plan of care.    Osker Mason, [facility]   **Disclaimer: This note was dictated with voice recognition software. Similar sounding words can inadvertently be transcribed and this note may contain transcription errors which may not have been corrected upon publication of note.**

## 2022-12-15 ENCOUNTER — Ambulatory Visit: Payer: Medicare HMO | Admitting: Radiation Oncology

## 2022-12-15 ENCOUNTER — Ambulatory Visit: Payer: Medicare HMO

## 2022-12-16 ENCOUNTER — Ambulatory Visit
Admission: RE | Admit: 2022-12-16 | Discharge: 2022-12-16 | Disposition: A | Discharge status: Home / Self Care | Payer: Medicare HMO | Source: Ambulatory Visit | Attending: Radiation Oncology | Admitting: Radiation Oncology

## 2022-12-16 ENCOUNTER — Encounter: Payer: Self-pay | Admitting: Radiation Oncology

## 2022-12-16 VITALS — BP 146/59 | HR 72 | Temp 97.9°F | Resp 18 | Ht 64.0 in | Wt 193.0 lb

## 2022-12-16 DIAGNOSIS — Z79890 Hormone replacement therapy (postmenopausal): Secondary | ICD-10-CM | POA: Diagnosis not present

## 2022-12-16 DIAGNOSIS — Z7984 Long term (current) use of oral hypoglycemic drugs: Secondary | ICD-10-CM | POA: Diagnosis not present

## 2022-12-16 DIAGNOSIS — C3412 Malignant neoplasm of upper lobe, left bronchus or lung: Secondary | ICD-10-CM | POA: Insufficient documentation

## 2022-12-16 DIAGNOSIS — Z79899 Other long term (current) drug therapy: Secondary | ICD-10-CM | POA: Diagnosis not present

## 2022-12-16 DIAGNOSIS — I110 Hypertensive heart disease with heart failure: Secondary | ICD-10-CM | POA: Insufficient documentation

## 2022-12-16 DIAGNOSIS — Z87442 Personal history of urinary calculi: Secondary | ICD-10-CM | POA: Diagnosis not present

## 2022-12-16 DIAGNOSIS — I2510 Atherosclerotic heart disease of native coronary artery without angina pectoris: Secondary | ICD-10-CM | POA: Diagnosis not present

## 2022-12-16 DIAGNOSIS — C3411 Malignant neoplasm of upper lobe, right bronchus or lung: Secondary | ICD-10-CM | POA: Diagnosis not present

## 2022-12-16 DIAGNOSIS — E785 Hyperlipidemia, unspecified: Secondary | ICD-10-CM | POA: Diagnosis not present

## 2022-12-16 DIAGNOSIS — Z7901 Long term (current) use of anticoagulants: Secondary | ICD-10-CM | POA: Insufficient documentation

## 2022-12-16 DIAGNOSIS — Z809 Family history of malignant neoplasm, unspecified: Secondary | ICD-10-CM | POA: Diagnosis not present

## 2022-12-16 DIAGNOSIS — I509 Heart failure, unspecified: Secondary | ICD-10-CM | POA: Diagnosis not present

## 2022-12-16 DIAGNOSIS — E119 Type 2 diabetes mellitus without complications: Secondary | ICD-10-CM | POA: Insufficient documentation

## 2022-12-16 DIAGNOSIS — Z803 Family history of malignant neoplasm of breast: Secondary | ICD-10-CM | POA: Insufficient documentation

## 2022-12-16 DIAGNOSIS — I429 Cardiomyopathy, unspecified: Secondary | ICD-10-CM | POA: Diagnosis not present

## 2022-12-16 DIAGNOSIS — Z87891 Personal history of nicotine dependence: Secondary | ICD-10-CM | POA: Diagnosis not present

## 2022-12-16 DIAGNOSIS — I700 Atherosclerosis of aorta: Secondary | ICD-10-CM | POA: Diagnosis not present

## 2022-12-16 DIAGNOSIS — I4891 Unspecified atrial fibrillation: Secondary | ICD-10-CM | POA: Insufficient documentation

## 2022-12-16 DIAGNOSIS — C3492 Malignant neoplasm of unspecified part of left bronchus or lung: Secondary | ICD-10-CM

## 2022-12-16 NOTE — Progress Notes (Signed)
Nursing interview for Malignant neoplasm of upper lobe of left lung. Patient identity verified x2.  Patient reports a mild cough, due to current allergies (pollen), but is otherwise doing well. Denies any chest pains or SOB.  Meaningful use complete. Hysterectomy  Vitals- BP (!) 146/59 (BP Location: Left Arm, Patient Position: Sitting, Cuff Size: Normal)   Pulse 72   Temp 97.9 F (36.6 C) (Oral)   Resp 18   Ht  (1.626 m)   Wt 193 lb (87.5 kg)   SpO2 97%   BMI 33.13 kg/m   This concludes the interview.   Ruel Favors, LPN

## 2022-12-17 ENCOUNTER — Ambulatory Visit
Admission: RE | Admit: 2022-12-17 | Discharge: 2022-12-17 | Disposition: A | Discharge status: Home / Self Care | Payer: Medicare HMO | Source: Ambulatory Visit | Attending: Radiation Oncology | Admitting: Radiation Oncology

## 2022-12-17 DIAGNOSIS — C3412 Malignant neoplasm of upper lobe, left bronchus or lung: Secondary | ICD-10-CM | POA: Diagnosis present

## 2022-12-30 ENCOUNTER — Ambulatory Visit
Admission: RE | Admit: 2022-12-30 | Discharge: 2022-12-30 | Disposition: A | Discharge status: Home / Self Care | Payer: Medicare HMO | Source: Ambulatory Visit | Attending: Internal Medicine | Admitting: Internal Medicine

## 2022-12-30 DIAGNOSIS — E042 Nontoxic multinodular goiter: Secondary | ICD-10-CM

## 2023-01-04 DIAGNOSIS — C3412 Malignant neoplasm of upper lobe, left bronchus or lung: Secondary | ICD-10-CM | POA: Diagnosis present

## 2023-01-05 ENCOUNTER — Ambulatory Visit
Admission: RE | Admit: 2023-01-05 | Discharge: 2023-01-05 | Disposition: A | Discharge status: Home / Self Care | Payer: Medicare HMO | Source: Ambulatory Visit | Attending: Radiation Oncology | Admitting: Radiation Oncology

## 2023-01-05 ENCOUNTER — Other Ambulatory Visit: Payer: Self-pay

## 2023-01-05 DIAGNOSIS — C3412 Malignant neoplasm of upper lobe, left bronchus or lung: Secondary | ICD-10-CM | POA: Diagnosis not present

## 2023-01-05 LAB — RAD ONC ARIA SESSION SUMMARY
Course Elapsed Days: 0
Plan Fractions Treated to Date: 1
Plan Prescribed Dose Per Fraction: 18 Gy
Plan Total Fractions Prescribed: 3
Plan Total Prescribed Dose: 54 Gy
Reference Point Dosage Given to Date: 18 Gy
Reference Point Session Dosage Given: 18 Gy
Session Number: 1

## 2023-01-07 ENCOUNTER — Other Ambulatory Visit: Payer: Self-pay

## 2023-01-07 ENCOUNTER — Ambulatory Visit
Admission: RE | Admit: 2023-01-07 | Discharge: 2023-01-07 | Disposition: A | Discharge status: Home / Self Care | Payer: Medicare HMO | Source: Ambulatory Visit | Attending: Radiation Oncology | Admitting: Radiation Oncology

## 2023-01-07 DIAGNOSIS — C3412 Malignant neoplasm of upper lobe, left bronchus or lung: Secondary | ICD-10-CM | POA: Diagnosis not present

## 2023-01-07 LAB — RAD ONC ARIA SESSION SUMMARY
Course Elapsed Days: 2
Plan Fractions Treated to Date: 2
Plan Prescribed Dose Per Fraction: 18 Gy
Plan Total Fractions Prescribed: 3
Plan Total Prescribed Dose: 54 Gy
Reference Point Dosage Given to Date: 36 Gy
Reference Point Session Dosage Given: 18 Gy
Session Number: 2

## 2023-01-10 ENCOUNTER — Other Ambulatory Visit: Payer: Self-pay

## 2023-01-10 ENCOUNTER — Ambulatory Visit
Admission: RE | Admit: 2023-01-10 | Discharge: 2023-01-10 | Disposition: A | Discharge status: Home / Self Care | Payer: Medicare HMO | Source: Ambulatory Visit | Attending: Radiation Oncology | Admitting: Radiation Oncology

## 2023-01-10 DIAGNOSIS — C3412 Malignant neoplasm of upper lobe, left bronchus or lung: Secondary | ICD-10-CM | POA: Diagnosis not present

## 2023-01-10 LAB — RAD ONC ARIA SESSION SUMMARY
Course Elapsed Days: 5
Plan Fractions Treated to Date: 3
Plan Prescribed Dose Per Fraction: 18 Gy
Plan Total Fractions Prescribed: 3
Plan Total Prescribed Dose: 54 Gy
Reference Point Dosage Given to Date: 54 Gy
Reference Point Session Dosage Given: 18 Gy
Session Number: 3

## 2023-01-13 NOTE — Radiation Completion Notes (Addendum)
  Radiation Oncology         (336) [phone] ________________________________  Name: SOUTHERN FLANDERS MRN: [id number]  Date of Service: 01/10/2023  DOB: 26-Oct-1950  End of Treatment Note  Diagnosis: Putative Stage IA2, cT1bN0M0, NSCLC of the LUL with a history of Multifocal Stage IA2, cT1bN0M0, NSCLC, adenocarcinoma of the RUL nodules  Intent: Curative     ==========DELIVERED PLANS==========  First Treatment Date: 2023-01-05 - Last Treatment Date: 2023-01-10   Plan Name: Lung_L_SBRT Site: Lung, Left Technique: SBRT/SRT-IMRT Mode: Photon Dose Per Fraction: 18 Gy Prescribed Dose (Delivered / Prescribed): 54 Gy / 54 Gy Prescribed Fxs (Delivered / Prescribed): 3 / 3     ==========ON TREATMENT VISIT DATES========== 2023-01-05, 2023-01-07, 2023-01-07, 2023-01-10  See weekly On Treatment Notes is Epic for details. The patient tolerated radiation. She did not have complaints of fatigue or skin changes at the completion of therapy.  The patient will receive a call in about one month from the radiation oncology department. She will continue follow up with Dr. Arbutus Ped for CT surveillance as well as she is also followed for disease that has been treated in the RUL.      Osker Mason, PAC

## 2023-03-14 ENCOUNTER — Ambulatory Visit
Admission: RE | Admit: 2023-03-14 | Discharge: 2023-03-14 | Disposition: A | Discharge status: Home / Self Care | Payer: Medicare HMO | Source: Ambulatory Visit | Attending: Internal Medicine | Admitting: Internal Medicine

## 2023-03-14 NOTE — Progress Notes (Signed)
  Radiation Oncology         [phone] ________________________________  Name: Dorothy Gross MRN: [id number]  Date of Service: 03/14/2023  DOB: 1951-07-02  Post Treatment Telephone Note  Diagnosis:  Putative Stage IA2, cT1bN0M0, NSCLC of the LUL with a history of Multifocal Stage IA2, cT1bN0M0, NSCLC, adenocarcinoma of the RUL nodules (as documented in provider EOT note)   The patient was available for call today.   Symptoms of fatigue have improved since completing therapy.  Symptoms of skin changes have improved since completing therapy.  Symptoms of esophagitis have improved since completing therapy.   The patient has scheduled follow up with her medical oncologist Dr. Arbutus Ped for ongoing care, and was encouraged to call if she develops concerns or questions regarding radiation.   This concludes the interaction.  Ruel Favors, LPN

## 2023-03-30 ENCOUNTER — Ambulatory Visit: Payer: Medicare HMO | Admitting: Internal Medicine

## 2023-06-02 ENCOUNTER — Ambulatory Visit: Payer: Medicare HMO | Admitting: Internal Medicine

## 2023-06-02 ENCOUNTER — Encounter: Payer: Self-pay | Admitting: Internal Medicine

## 2023-06-02 ENCOUNTER — Telehealth: Payer: Self-pay | Admitting: Internal Medicine

## 2023-06-02 VITALS — BP 130/80 | HR 88 | Ht 64.0 in | Wt 179.0 lb

## 2023-06-02 DIAGNOSIS — E039 Hypothyroidism, unspecified: Secondary | ICD-10-CM | POA: Diagnosis not present

## 2023-06-02 DIAGNOSIS — D3500 Benign neoplasm of unspecified adrenal gland: Secondary | ICD-10-CM | POA: Diagnosis not present

## 2023-06-02 DIAGNOSIS — E559 Vitamin D deficiency, unspecified: Secondary | ICD-10-CM | POA: Diagnosis not present

## 2023-06-02 DIAGNOSIS — E042 Nontoxic multinodular goiter: Secondary | ICD-10-CM

## 2023-06-02 LAB — BASIC METABOLIC PANEL
BUN: 19 mg/dL (ref 6–23)
CO2: 29 meq/L (ref 19–32)
Calcium: 9.3 mg/dL (ref 8.4–10.5)
Chloride: 103 meq/L (ref 96–112)
Creatinine, Ser: 0.72 mg/dL (ref 0.40–1.20)
GFR: 85.01 mL/min (ref >60.00)
Glucose, Bld: 100 mg/dL — ABNORMAL HIGH (ref 70–99)
Potassium: 4.1 meq/L (ref 3.5–5.1)
Sodium: 141 meq/L (ref 135–145)

## 2023-06-02 LAB — VITAMIN D 25 HYDROXY (VIT D DEFICIENCY, FRACTURES): VITD: 31.31 ng/mL (ref 30.00–100.00)

## 2023-06-02 LAB — TSH: TSH: 0.02 u[IU]/mL — ABNORMAL LOW (ref 0.35–5.50)

## 2023-06-02 NOTE — Telephone Encounter (Signed)
Can you please contact the patient and schedule her for a lab appointment only in 2 months?   Thank you

## 2023-06-02 NOTE — Progress Notes (Signed)
Name: Dorothy Gross  MRN/ DOB: [id number], 27-Feb-1951    Age/ Sex: 72 y.o., female     PCP: Jerl Mina, MD   Reason for Endocrinology Evaluation: Adrenal adenoma      Initial Endocrinology Clinic Visit: 05/26/2020    PATIENT IDENTIFIER: Dorothy Gross is a 72 y.o., female with a past medical history of A. Fib, CHF, HTN and nephrolithiasis. She has followed with [facility] since 05/26/2020  for consultative assistance with management of her adrenal adenoma  HISTORICAL SUMMARY:   Pt had an incidental finding of bilateral adrenal nodularity bilaterally L>R, with a HFU of 40 and 95 post-contrast on CT scan in 12/2019 during painless hematuria workup.   Her Aldo , renin , urinary free cortisol were all normal. Metanephrines were slightly elevated at less then 2x the upper limit of normal, considered not clinically significant in 2021.  Patient was noted with elevated 24-hour urinary cortisol at 57.1 mcg(reference 50) in April 2023 Her dexamethasone suppression test also came back elevated at 2.9 (reference 1.8) Repeat 24-hour urinary cortisol came back normal 04/2022, one of the 2 saliva cortisol samples came back elevated      NO FH of adrenal gland issues, has strong FH of HTN NO FH of renal stones     THYROID HISTORY: She was noted with left thyroid nodule on exam in 04/2020, this was confirmed by a thyroid ultrasound revealing a left inferior 2.8 cm meeting FNA criteria.  She is S/P FNA of the left thyroid nodule on 06/2020 with benign cytology   She was started on levothyroxine with a TSH 8.62 uIU/ml 08/2022  SUBJECTIVE:    Today (06/02/2023):  [name] is here for adrenal adenoma. She is accompanied by her son chris   She continues to follow-up with [facility] cardiology for CHF ,  cardiomyopathy, and A-fib in 01/2023  Patient continues to follow up with oncology for  multifocal lung adenocarcinoma  She is S/P radiation treatment to the  right upper lobe, recent scan showed increased size in the left upper lobe groundglass nodule suspicious for progressive disease in that area stable nodules on the right  Patient has completed another course of radiation treatment to the left upper lobe lung nodule    Held Biotin   Denies local neck swelling   Substantial weight gain- no  Pt noted with weight loss , intentional  Severe hypertension- no  Sudden/ severe headaches- no Anxiety attacks- no Cardiac arrhythmias- A. 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   Cardiomyopathy (HCC)    CHF (congestive heart failure) (HCC)    Diabetes mellitus without complication (HCC)    History of kidney stones    Hyperlipidemia    Hypertension    Lung cancer (HCC) 09/14/2021   Past Surgical History:  Past Surgical History:  Procedure Laterality Date   ABDOMINAL HYSTERECTOMY     APPENDECTOMY     BREAST EXCISIONAL BIOPSY Right 1191,4782   negative 1974 negative 2015   BRONCHIAL BIOPSY  03/23/2021   Procedure:  BRONCHIAL BIOPSIES;  Surgeon: Josephine Igo, DO;  Location: [facility];  Service: Pulmonary;;   BRONCHIAL BIOPSY  09/14/2021   Procedure: BRONCHIAL BIOPSIES;  Surgeon: Josephine Igo, DO;  Location: [facility];  Service: Pulmonary;;   BRONCHIAL BRUSHINGS  03/23/2021   Procedure: BRONCHIAL BRUSHINGS;  Surgeon: Josephine Igo, DO;  Location: [facility];  Service: Pulmonary;;   BRONCHIAL BRUSHINGS  09/14/2021   Procedure: BRONCHIAL BRUSHINGS;  Surgeon: Josephine Igo, DO;  Location: [facility];  Service: Pulmonary;;   BRONCHIAL NEEDLE  ASPIRATION BIOPSY  09/14/2021   Procedure: BRONCHIAL NEEDLE ASPIRATION BIOPSIES;  Surgeon: Josephine Igo, DO;  Location: [facility];  Service: Pulmonary;;   BRONCHIAL WASHINGS  03/23/2021   Procedure: BRONCHIAL WASHINGS;  Surgeon: Josephine Igo, DO;  Location: [facility];  Service: Pulmonary;;   CHOLECYSTECTOMY N/A 12/05/2015   Procedure: LAPAROSCOPIC CHOLECYSTECTOMY WITH INTRAOPERATIVE CHOLANGIOGRAM;  Surgeon: Nadeen Landau, MD;  Location: [facility];  Service: General;  Laterality: N/A;   COLONOSCOPY WITH PROPOFOL N/A 10/18/2016   Procedure: COLONOSCOPY WITH PROPOFOL;  Surgeon: Scot Jun, MD;  Location: [facility] ENDOSCOPY;  Service: Endoscopy;  Laterality: N/A;   COLONOSCOPY WITH PROPOFOL N/A 03/17/2018   Procedure: COLONOSCOPY WITH PROPOFOL;  Surgeon: Scot Jun, MD;  Location: [facility] END[facility]COPY;  Service: Endoscopy;  Laterality: N/A;   CYSTOSCOPY W/ RETROGRADES Bilateral 03/07/2020   Procedure: CYSTOSCOPY WITH RETROGRADE PYELOGRAM;  Surgeon: Sondra Come, MD;  Location: [facility];  Service: Urology;  Laterality: Bilateral;   CYSTOSCOPY/URETEROSCOPY/HOLMIUM LASER/STENT PLACEMENT Bilateral 03/07/2020   Procedure: CYSTOSCOPY/URETEROSCOPY/HOLMIUM LASER/STENT PLACEMENT;  Surgeon: Sondra Come, MD;  Location: [facility];  Service: Urology;  Laterality: Bilateral;   FIDUCIAL MARKER PLACEMENT  03/23/2021   Procedure: FIDUCIAL MARKER PLACEMENT;  Surgeon: Josephine Igo, DO;  Location: [facility];  Service: Pulmonary;;   JOINT REPLACEMENT     both knees   REPLACEMENT TOTAL KNEE BILATERAL     TONSILLECTOMY     VIDEO BRONCHOSCOPY WITH ENDOBRONCHIAL NAVIGATION Right 03/23/2021   Procedure: VIDEO BRONCHOSCOPY WITH ENDOBRONCHIAL NAVIGATION;  Surgeon: Josephine Igo, DO;  Location: [facility];  Service: Pulmonary;  Laterality: Right;  ION Case    VIDEO BRONCHOSCOPY WITH RADIAL ENDOBRONCHIAL ULTRASOUND  03/23/2021   Procedure: VIDEO BRONCHOSCOPY WITH RADIAL ENDOBRONCHIAL ULTRASOUND;   Surgeon: Josephine Igo, DO;  Location: [facility];  Service: Pulmonary;;   VIDEO BRONCHOSCOPY WITH RADIAL ENDOBRONCHIAL ULTRASOUND  09/14/2021   Procedure: RADIAL ENDOBRONCHIAL ULTRASOUND;  Surgeon: Josephine Igo, DO;  Location: [facility];  Service: Pulmonary;;   Social History:  reports that she quit smoking about 15 years ago. Her smoking use included cigarettes. She started smoking about 30 years ago. She has a 3.8 pack-year smoking history. She has never used smokeless tobacco. She reports that she does not drink alcohol and does not use drugs. Family History:  Family History  Problem Relation Age of Onset   Diabetes Mother    Diabetes Sister    Diabetes Maternal Aunt    Cancer Maternal Uncle    Cancer Maternal Uncle    Cancer Maternal Uncle    Cancer Maternal Uncle    Cancer Maternal Uncle    Cancer Maternal Uncle    Cancer Maternal Uncle    Cancer Maternal Uncle    Cancer Maternal Uncle    Breast cancer Neg Hx      HOME MEDICATIONS: Allergies as of 06/02/2023   No Known Allergies      Medication List  Accurate as of June 02, 2023  6:50 AM. If you have any questions, ask your nurse or doctor.          acetaminophen 500 MG tablet Commonly known as: TYLENOL Take 1,000 mg by mouth every 6 (six) hours as needed.   amiodarone 200 MG tablet Commonly known as: PACERONE Take 200 mg by mouth daily.   apixaban 5 MG Tabs tablet Commonly known as: ELIQUIS Take 5 mg by mouth 2 (two) times daily.   BIOTIN PO Take 1 capsule by mouth daily.   calcium carbonate 1250 (500 Ca) MG tablet Commonly known as: OS-CAL - dosed in mg of elemental calcium Take 1 tablet by mouth daily with breakfast.   carvedilol 6.25 MG tablet Commonly known as: COREG Take 6.25 mg by mouth 2 (two) times daily with a meal.   cyanocobalamin 1000 MCG tablet Take 1,000 mcg by mouth daily.   digoxin 0.125 MG tablet Commonly known as: LANOXIN Take 0.125 mg by mouth daily.    ferrous sulfate 325 (65 FE) MG tablet Take 325 mg by mouth every other day.   FLAX SEED OIL PO Take 2 capsules by mouth daily.   furosemide 40 MG tablet Commonly known as: LASIX Take 40 mg by mouth daily.   Lancets Misc   levothyroxine 50 MCG tablet Commonly known as: SYNTHROID Take 1 tablet (50 mcg total) by mouth daily.   losartan 25 MG tablet Commonly known as: COZAAR Take 2 tablets by mouth daily.   metFORMIN 500 MG 24 hr tablet Commonly known as: GLUCOPHAGE-XR Take 1,000 mg by mouth daily with supper.   MULTIVITAMIN ADULT PO Take 1 tablet by mouth daily.   OneTouch Ultra test strip Generic drug: glucose blood USE 1 EACH (1 STRIP TOTAL) 3 (THREE) TIMES DAILY   rosuvastatin 10 MG tablet Commonly known as: CRESTOR Take 10 mg by mouth daily.   sodium chloride 0.65 % Soln nasal spray Commonly known as: OCEAN Place 1 spray into both nostrils daily as needed (Dry nose).   SYSTANE OP Place 1 drop into both eyes daily as needed (dry eyes).   Vitamin D (Ergocalciferol) 1.25 MG (50000 UNIT) Caps capsule Commonly known as: DRISDOL Take 1 capsule (50,000 Units total) by mouth every 7 (seven) days.   Vitamin D3 25 MCG (1000 UT) Caps Take 1,000 Units by mouth daily.   zinc gluconate 50 MG tablet Take 50 mg by mouth daily.          OBJECTIVE:   PHYSICAL EXAM: QV:ZDGLO were no vitals taken for this visit.   EXAM: General: Pt appears well and is in NAD  Thyroid : Left thyroid nodule appreciated   Lungs: Clear with good BS bilat with no rales, rhonchi, or wheezes  Heart: Auscultation: RRR.  Abdomen: Normoactive bowel sounds, soft, nontender, without masses or organomegaly palpable  Extremities:  BL LE: No pretibial edema normal ROM and strength.  Mental Status: Judgment, insight: Intact Orientation: Oriented to time, place, and person Mood and affect: No depression, anxiety, or agitation     DATA REVIEWED:   Latest Reference Range & Units 06/02/23  08:09  Sodium 135 - 145 mEq/L 141  Potassium 3.5 - 5.1 mEq/L 4.1  Chloride 96 - 112 mEq/L 103  CO2 19 - 32 mEq/L 29  Glucose 70 - 99 mg/dL 756 (H)  BUN 6 - 23 mg/dL 19  Creatinine 4.33 - 2.95 mg/dL 1.88  Calcium 8.4 - 41.6 mg/dL 9.3  GFR >60.63 mL/min 85.01  Latest Reference Range & Units 06/02/23 08:09  VITD 30.00 - 100.00 ng/mL 31.31    Latest Reference Range & Units 06/02/23 08:09  TSH 0.35 - 5.50 uIU/mL 0.02 (L)  (L): Data is abnormally low    Thyroid Ultrasound 12/30/2022  Estimated total number of nodules >/= 1 cm: 2   Number of spongiform nodules >/=  2 cm not described below (TR1): 0   Number of mixed cystic and solid nodules >/= 1.5 cm not described below (TR2): 0   _________________________________________________________   Nodule 1: 0.9 cm solid isoechoic right superior thyroid nodule is unchanged in size and does not meet criteria for FNA or imaging follow-up.   Nodule 2: 0.8 cm solid hypoechoic right mid thyroid nodule does not meet criteria for imaging surveillance or FNA.   Nodule 3: 1.0 cm solid isoechoic right inferior thyroid nodule is unchanged in size since prior examination and does not meet criteria for FNA or imaging follow-up.   Nodule 4: 3.3 x 3.2 x 2.2 cm solid isoechoic left inferior thyroid nodule is unchanged in size. Please correlate with prior FNA results from 07/18/2020.   IMPRESSION: 1. Previously biopsied left inferior thyroid nodule is unchanged in size. Please correlate with prior FNA results from 07/18/2020. 2. Remaining right thyroid nodules do not meet criteria for FNA or imaging surveillance.    FNA of the left  thyroid Nodule 07/18/2020  DIAGNOSIS:  A. THYROID GLAND, LEFT LOBE; ULTRASOUND-GUIDED FINE-NEEDLE ASPIRATION:  - BENIGN (BETHESDA CATEGORY II).   PET scan 11/02/2021 NECK: Left thyroid nodules including a 1.6 cm hypodense nodule and a 1.5 cm hypodense nodule, with metabolic activity similar to the rest of the  thyroid gland.   Incidental CT findings: Bilateral common carotid atherosclerotic calcification.   CHEST: Fiducial near the dominant right lower lobe nodule sitting just anterior to the major fissure. This sub solid nodule measures 1.3 by 1.0 cm on image 64 series 4 has a maximum SUV of 2.1.   The subpleural 1.5 by 0.9 cm nodule at the right lung apex has a maximum SUV of 1.7. The remaining sub solid nodules are too small to characterize.   Low-grade activity along the hazy bilateral nonfocal subpleural reticulation primarily in the lower lobes.   Incidental CT findings: Coronary, aortic arch, and branch vessel atherosclerotic vascular disease. Mild cardiomegaly.   ABDOMEN/PELVIS: Lobulated left adrenal mass 2.4 by 1.5 cm on image 103 of series 4, previously 2.6 by 1.6 cm back on 01/09/2020, with only low-grade metabolic activity, maximum SUV 3.5 (the contralateral morphologically normal appearing right adrenal gland has a maximum SUV of 3.7). This is probably an adenoma based on prior absolute washout of 67%. Current noncontrast density measurements are nonspecific.   Widespread accentuated bowel activity is likely physiologic.   Incidental CT findings: Cholecystectomy. Nonobstructive nephrolithiasis on the right. Residual contrast in the renal collecting systems and urinary bladder. Atherosclerosis is present, including aortoiliac atherosclerotic disease.   SKELETON: No significant abnormal hypermetabolic activity in this region.   Incidental CT findings: Mild levoconvex lumbar scoliosis.   IMPRESSION: 1. The dominant subsolid right lower lobe nodule just anterior to the major fissure has a maximum SUV of 2.1, compatible with low-grade adenocarcinoma based on positive biopsy result. The subpleural 1.5 by 0.9 cm nodule at the right lung apex has a maximum SUV of 1.7. The other sub solid pulmonary nodules are too small to characterize. 2. Low-grade activity along hazy  bilateral nonfocal subpleural reticulations in the lower lobes, likely inflammatory. 3.  Lobulated left adrenal mass is not hypermetabolic compared to the contralateral morphologically normal right adrenal gland, and is probably an adenoma based on current and prior imaging. 4. Left thyroid nodules including a 1.6 cm nodule. These were worked up on prior thyroid ultrasound of 06/25/2020 and biopsy of 07/18/2020, which was benign. This has been evaluated on previous imaging. (ref: J Am Coll Radiol. 2015 Feb;12(2): 143-50). 5. Other imaging findings of potential clinical significance: Aortic Atherosclerosis (ICD10-I70.0). Coronary atherosclerosis. Mild cardiomegaly. Nonobstructive nephrolithiasis. Levoconvex lumbar scoliosis.     ASSESSMENT / PLAN / RECOMMENDATIONS:    Adrenal Adenoma:    - B/L adrenal adenomas , stable PET scan 10/2021,  during evaluation for adenocarcinoma of the lung and adrenal adenoma was stable at 2.4 x 1.5 cm -Renin and aldo and  urinary catecholamines have been normal 04/2022 -Will proceed again for this year -BMP normal     2. Multinodular Goiter:    - Pt is clinically euthyroid  - No local neck symptoms - S/P benign FNA of the left thyroid nodule 06/2020 -Repeat thyroid ultrasound 12/2022 shows stability of the left thyroid nodule     3.  Elevated Cortisol :   -Patient was noted with elevated 24-hour urinary cortisol at 57.1 mcg(reference 50) in April 2023 Her dexamethasone suppression test also came back elevated at 2.9 (reference 1.8) -Previous ACTH was low-normal level -Repeat 24-hr urine cortisol was low at 2 ug (04/2022) with one of the salivary cortisol elevated at 0.105 04/2022, ACTH normal at 13 pg/mL  - Repeat 24-hr urine cortisol was normal 18 mcg 09/2022 -I suspect this was false positive given treatment for cancer and receiving radiation therapy close to the initial time of testing -There is no clinical evidence of Cushing syndrome -We will  continue to check annually    4. Subclinical Hypothyroidism:  -Patient on amiodarone chronically -Her amiodarone dose was recently reduced -She held biotin appropriately -TSH is low, will decrease levothyroxine and recheck in 2 months  Medication This levothyroxine 50 mcg, half a tablet daily   5.  Vitamin D deficiency:  -Vitamin D normalized  Continue ergocalciferol 50,000 IU weekly  F/U in 6 months     Signed electronically by: Lyndle Herrlich, MD  [facility]  [facility] [address] [place], [place] [address] Phone: [phone] FAX: [phone]      CC: Jerl Mina, MD [address] [place] [address] Phone: [phone]  Fax: [phone]   Return to Endocrinology clinic as below: Future Appointments  Date Time Provider Department Center  06/02/2023  7:30 AM Tiron Suski, Konrad Dolores, MD [facility] None  06/07/2023  3:00 PM [facility] None  06/07/2023  4:00 PM [facility] 1 [facility] [place]  06/09/2023  1:30 PM Si Gaul, MD [facility] None

## 2023-06-03 MED ORDER — LEVOTHYROXINE SODIUM 50 MCG PO TABS: 25.0000 ug | ORAL_TABLET | Freq: Every day | ORAL | Status: DC

## 2023-06-03 MED ORDER — VITAMIN D (ERGOCALCIFEROL) 1.25 MG (50000 UNIT) PO CAPS: 50000.0000 [IU] | ORAL_CAPSULE | ORAL | 3 refills | Status: DC

## 2023-06-07 ENCOUNTER — Inpatient Hospital Stay: Payer: Medicare HMO | Attending: Internal Medicine

## 2023-06-07 ENCOUNTER — Encounter (HOSPITAL_COMMUNITY): Payer: Self-pay

## 2023-06-07 ENCOUNTER — Ambulatory Visit (HOSPITAL_COMMUNITY)
Admission: RE | Admit: 2023-06-07 | Discharge: 2023-06-07 | Disposition: A | Discharge status: Home / Self Care | Payer: Medicare HMO | Source: Ambulatory Visit | Attending: Internal Medicine | Admitting: Internal Medicine

## 2023-06-07 DIAGNOSIS — C3411 Malignant neoplasm of upper lobe, right bronchus or lung: Secondary | ICD-10-CM | POA: Diagnosis present

## 2023-06-07 DIAGNOSIS — C3490 Malignant neoplasm of unspecified part of unspecified bronchus or lung: Secondary | ICD-10-CM | POA: Insufficient documentation

## 2023-06-07 DIAGNOSIS — Z923 Personal history of irradiation: Secondary | ICD-10-CM | POA: Insufficient documentation

## 2023-06-07 LAB — CBC WITH DIFFERENTIAL (CANCER CENTER ONLY)
Abs Immature Granulocytes: 0.01 10*3/uL (ref 0.00–0.07)
Basophils Absolute: 0.1 10*3/uL (ref 0.0–0.1)
Basophils Relative: 1 %
Eosinophils Absolute: 0.1 10*3/uL (ref 0.0–0.5)
Eosinophils Relative: 2 %
HCT: 42 % (ref 36.0–46.0)
Hemoglobin: 13.2 g/dL (ref 12.0–15.0)
Immature Granulocytes: 0 %
Lymphocytes Relative: 20 %
Lymphs Abs: 1.2 10*3/uL (ref 0.7–4.0)
MCH: 26.3 pg (ref 26.0–34.0)
MCHC: 31.4 g/dL (ref 30.0–36.0)
MCV: 83.7 fL (ref 80.0–100.0)
Monocytes Absolute: 0.6 10*3/uL (ref 0.1–1.0)
Monocytes Relative: 10 %
Neutro Abs: 4.1 10*3/uL (ref 1.7–7.7)
Neutrophils Relative %: 67 %
Platelet Count: 276 10*3/uL (ref 150–400)
RBC: 5.02 MIL/uL (ref 3.87–5.11)
RDW: 15.7 % — ABNORMAL HIGH (ref 11.5–15.5)
WBC Count: 6.1 10*3/uL (ref 4.0–10.5)
nRBC: 0 % (ref 0.0–0.2)

## 2023-06-07 LAB — CMP (CANCER CENTER ONLY)
ALT: 16 U/L (ref 0–44)
AST: 16 U/L (ref 15–41)
Albumin: 4.1 g/dL (ref 3.5–5.0)
Alkaline Phosphatase: 82 U/L (ref 38–126)
Anion gap: 7 (ref 5–15)
BUN: 15 mg/dL (ref 8–23)
CO2: 31 mmol/L (ref 22–32)
Calcium: 9.5 mg/dL (ref 8.9–10.3)
Chloride: 102 mmol/L (ref 98–111)
Creatinine: 0.63 mg/dL (ref 0.44–1.00)
GFR, Estimated: >60 mL/min (ref >60)
Glucose, Bld: 105 mg/dL — ABNORMAL HIGH (ref 70–99)
Potassium: 3.9 mmol/L (ref 3.5–5.1)
Sodium: 140 mmol/L (ref 135–145)
Total Bilirubin: 0.8 mg/dL (ref 0.3–1.2)
Total Protein: 7.3 g/dL (ref 6.5–8.1)

## 2023-06-07 MED ORDER — SODIUM CHLORIDE (PF) 0.9 % IJ SOLN
INTRAMUSCULAR | Status: AC
  Filled 2023-06-07: qty 50

## 2023-06-07 MED ORDER — IOHEXOL 300 MG/ML  SOLN
75.0000 mL | Freq: Once | INTRAMUSCULAR | Status: AC | PRN
  Administered 2023-06-07: 75 mL via INTRAVENOUS

## 2023-06-09 ENCOUNTER — Ambulatory Visit: Payer: Medicare HMO | Admitting: Internal Medicine

## 2023-06-09 LAB — METANEPHRINES, PLASMA
Metanephrine, Free: 58.6 pg/mL (ref 0.0–88.0)
Normetanephrine, Free: 108.7 pg/mL (ref 0.0–285.2)

## 2023-06-09 LAB — ALDOSTERONE + RENIN ACTIVITY W/ RATIO
Aldos/Renin Ratio: 19.2 (ref 0.0–30.0)
Aldosterone: 4.6 ng/dL (ref 0.0–30.0)
Renin Activity, Plasma: 0.239 ng/mL/h (ref 0.167–5.380)

## 2023-06-09 NOTE — Progress Notes (Signed)
[facility] / [facility] OFFICE PROGRESS NOTE  Jerl Mina, MD [address] 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 Molecular Study by [employer] [facility]: Positive EGFR V769M, G719S PDL1 Expression: 0%  PRIOR THERAPY:  1) SBRT to the right upper lobe lung nodule under the care of Dr. Mitzi Hansen in 2023 2) SBRT to the Left upper lobe GGO under the care of Dr. Mitzi Hansen. Completed on 01/05/23.    CURRENT THERAPY: Observation   INTERVAL HISTORY: Dorothy Gross 72 y.o. female returns to the clinic today for a follow-up visit. The patient was last seen by Dr. Arbutus Ped approximately 6 months ago.  The patient has a history of multifocal adenocarcinoma and she is status post SBRT to the right upper lobe under the care of Dr. Mitzi Hansen which was completed in February 2023.  At her last appointment with Dr. Arbutus Ped 6 months ago, she had a restaging CT scan that time that showed increased in size of a left upper lobe groundglass nodule suspicious for progressive disease in the area.  Therefore, she was referred to radiation oncology and had SBRT to this lesion which was completed on 01/05/2023.  She tolerated radiation well without any concerning adverse side effects.  Today she denies any fever, chills, night sweats. She lost weight but she feels better and has been more active. She also mentions she is eating more small frequent meals instead and being more conscious of her portion sizes. She does have some intermittent trouble with cough due to allergies, but states this is not consistent. Denies any chest pain, dyspnea exertion, or hemoptysis.  Denies any nausea, vomiting, diarrhea, or constipation.  Denies any headache or visual changes.  She recently had a restaging CT scan performed.  She is here today for evaluation to review her scan  results.    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  MEDICATIONS:  Current Outpatient Medications  Medication Sig Dispense Refill   acetaminophen (TYLENOL) 500 MG tablet Take 1,000 mg by mouth every 6 (six) hours as needed.     amiodarone (PACERONE) 100 MG tablet Take 100 mg by mouth daily.     apixaban (ELIQUIS) 5 MG TABS tablet Take 5 mg by mouth 2 (two) times daily.      BIOTIN PO Take 1 capsule by mouth daily.     calcium carbonate (OS-CAL - DOSED IN MG OF ELEMENTAL CALCIUM) 1250 (500 Ca) MG tablet Take 1 tablet by mouth daily with breakfast.     carvedilol (COREG) 6.25 MG tablet Take 6.25 mg by mouth 2 (two) times daily with a meal.      Cholecalciferol (VITAMIN D3) 1000 units CAPS Take 1,000 Units by mouth daily.      cyanocobalamin 1000 MCG tablet Take 1,000 mcg by mouth daily.      digoxin (LANOXIN) 0.125 MG tablet Take 0.125 mg by mouth daily.  ferrous sulfate 325 (65 FE) MG tablet Take 325 mg by mouth every other day.     Flaxseed, Linseed, (FLAX SEED OIL PO) Take 2 capsules by mouth daily.     furosemide (LASIX) 40 MG tablet Take 40 mg by mouth daily.      glucose blood (ONETOUCH ULTRA) test strip USE 1 EACH (1 STRIP TOTAL) 3 (THREE) TIMES DAILY     Lancets MISC      levothyroxine (SYNTHROID) 50 MCG tablet Take 0.5 tablets (25 mcg total) by mouth daily.     losartan (COZAAR) 25 MG tablet Take 2 tablets by mouth daily.     metFORMIN (GLUCOPHAGE-XR) 500 MG 24 hr tablet Take 1,000 mg by mouth daily with supper.     Multiple Vitamins-Minerals (MULTIVITAMIN ADULT PO) Take 1 tablet by mouth daily.      Polyethyl Glycol-Propyl Glycol (SYSTANE OP) Place 1 drop into both eyes daily as needed (dry eyes).     rosuvastatin (CRESTOR) 10 MG tablet Take 10 mg by mouth daily.      sodium chloride (OCEAN) 0.65 % SOLN nasal spray Place 1 spray into both nostrils daily as needed (Dry nose).     Vitamin D, Ergocalciferol, (DRISDOL) 1.25 MG (50000 UNIT) CAPS capsule Take 1 capsule (50,000 Units total) by mouth every 7 (seven) days. 12 capsule 3   zinc gluconate 50 MG tablet Take 50 mg by mouth daily.      amiodarone (PACERONE) 200 MG tablet Take 200 mg by mouth daily.  (Patient not taking: Reported on 06/02/2023)     No current facility-administered medications for this visit.    SURGICAL HISTORY:  Past Surgical History:  Procedure Laterality Date   ABDOMINAL HYSTERECTOMY     APPENDECTOMY     BREAST EXCISIONAL BIOPSY Right [id number]   negative 1974 negative 2015   BRONCHIAL BIOPSY  03/23/2021   Procedure: BRONCHIAL BIOPSIES;  Surgeon: Josephine Igo, DO;  Location: [facility] END[facility];  Service: Pulmonary;;   BRONCHIAL BIOPSY  09/14/2021   Procedure: BRONCHIAL BIOPSIES;  Surgeon: Josephine Igo, DO;  Location: [facility];  Service: Pulmonary;;   BRONCHIAL BRUSHINGS  03/23/2021   Procedure: BRONCHIAL BRUSHINGS;  Surgeon: Josephine Igo, DO;  Location: [facility];  Service: Pulmonary;;   BRONCHIAL BRUSHINGS  09/14/2021   Procedure: BRONCHIAL BRUSHINGS;  Surgeon: Josephine Igo, DO;  Location: [facility];  Service: Pulmonary;;   BRONCHIAL NEEDLE ASPIRATION BIOPSY  09/14/2021   Procedure: BRONCHIAL NEEDLE ASPIRATION BIOPSIES;  Surgeon: Josephine Igo, DO;  Location: [facility];  Service: Pulmonary;;   BRONCHIAL WASHINGS  03/23/2021   Procedure: BRONCHIAL WASHINGS;  Surgeon: Josephine Igo, DO;  Location: [facility];  Service: Pulmonary;;   CHOLECYSTECTOMY N/A 12/05/2015   Procedure: LAPAROSCOPIC CHOLECYSTECTOMY WITH INTRAOPERATIVE CHOLANGIOGRAM;  Surgeon: Nadeen Landau, MD;  Location: [facility]  ORS;  Service: General;  Laterality: N/A;   COLONOSCOPY WITH PROPOFOL N/A 10/18/2016   Procedure: COLONOSCOPY WITH PROPOFOL;  Surgeon: Scot Jun, MD;  Location: [facility] ENDOSCOPY;  Service: Endoscopy;  Laterality: N/A;   COLONOSCOPY WITH PROPOFOL N/A 03/17/2018   Procedure: COLONOSCOPY WITH PROPOFOL;  Surgeon: Scot Jun, MD;  Location: [facility] ENDOSCOPY;  Service: Endoscopy;  Laterality: N/A;   CYSTOSCOPY W/ RETROGRADES Bilateral 03/07/2020   Procedure: CYSTOSCOPY WITH RETROGRADE PYELOGRAM;  Surgeon: Sondra Come, MD;  Location: [facility] ORS;  Service: Urology;  Laterality: Bilateral;   CYSTOSCOPY/URETEROSCOPY/HOLMIUM LASER/STENT PLACEMENT Bilateral 03/07/2020   Procedure: CYSTOSCOPY/URETEROSCOPY/HOLMIUM LASER/STENT PLACEMENT;  Surgeon: Sondra Come, MD;  Location: [facility] ORS;  Service: Urology;  Laterality: Bilateral;   FIDUCIAL MARKER PLACEMENT  03/23/2021   Procedure: FIDUCIAL MARKER PLACEMENT;  Surgeon: Josephine Igo, DO;  Location: [facility] ENDOSCOPY;  Service: Pulmonary;;   JOINT REPLACEMENT     both knees   REPLACEMENT TOTAL KNEE BILATERAL     TONSILLECTOMY     VIDEO BRONCHOSCOPY WITH ENDOBRONCHIAL NAVIGATION Right 03/23/2021   Procedure: VIDEO BRONCHOSCOPY WITH ENDOBRONCHIAL NAVIGATION;  Surgeon: Josephine Igo, DO;  Location: [facility] ENDOSCOPY;  Service: Pulmonary;  Laterality: Right;  ION Case    VIDEO BRONCHOSCOPY WITH RADIAL ENDOBRONCHIAL ULTRASOUND  03/23/2021   Procedure: VIDEO BRONCHOSCOPY WITH RADIAL ENDOBRONCHIAL ULTRASOUND;  Surgeon: Josephine Igo, DO;  Location: [facility] ENDOS[facility];  Service: Pulmonary;;   VIDEO BRONCHOSCOPY WITH RADIAL ENDOBRONCHIAL ULTRASOUND  09/14/2021   Procedure: RADIAL ENDOBRONCHIAL ULTRASOUND;  Surgeon: Josephine Igo, DO;  Location: [facility] ENDOS[facility];  Service: Pulmonary;;    REVIEW OF SYSTEMS:   Review of Systems  Constitutional: Negative for appetite change, chills, fatigue, fever and unexpected weight change.  HENT: Negative for mouth sores, nosebleeds,  sore throat and trouble swallowing.   Eyes: Negative for eye problems and icterus.  Respiratory: Positive for mild occasional cough depending on allergies. Negative for hemoptysis, shortness of breath and wheezing.   Cardiovascular: Negative for chest pain and leg swelling.  Gastrointestinal: Negative for abdominal pain, constipation, diarrhea, nausea and vomiting.  Genitourinary: Negative for bladder incontinence, difficulty urinating, dysuria, frequency and hematuria.   Musculoskeletal: Negative for back pain, gait problem, neck pain and neck stiffness.  Skin: Negative for itching and rash.  Neurological: Negative for dizziness, extremity weakness, gait problem, headaches, light-headedness and seizures.  Hematological: Negative for adenopathy. Does not bruise/bleed easily.  Psychiatric/Behavioral: Negative for confusion, depression and sleep disturbance. The patient is not nervous/anxious.     PHYSICAL EXAMINATION:  Blood pressure (!) 172/64, pulse 79, temperature 97.9 F (36.6 C), temperature source Oral, resp. rate 17, height 5\' 4"  (1.626 m), weight 179 lb (81.2 kg), SpO2 97%.  ECOG PERFORMANCE STATUS: 0  Physical Exam  Constitutional: Oriented to person, place, and time and well-developed, well-nourished, and in no distress.  HENT:  Head: Normocephalic and atraumatic.  Mouth/Throat: Oropharynx is clear and moist. No oropharyngeal exudate.  Eyes: Conjunctivae are normal. Right eye exhibits no discharge. Left eye exhibits no discharge. No scleral icterus.  Neck: Normal range of motion. Neck supple.  Cardiovascular: Normal rate, regular rhythm, normal heart sounds and intact distal pulses.   Pulmonary/Chest: Effort normal and breath sounds normal. No respiratory distress. No wheezes. No rales.  Abdominal: Soft. Bowel sounds are normal. Exhibits no distension and no mass. There is no tenderness.  Musculoskeletal: Normal range of motion. Exhibits no edema.  Lymphadenopathy:    No  cervical adenopathy.  Neurological: Alert and oriented to person, place, and time. Exhibits normal muscle tone. Gait normal. Coordination normal.  Skin: Skin is warm and dry. No rash noted. Not diaphoretic. No erythema. No pallor.  Psychiatric: Mood, memory and judgment normal.  Vitals reviewed.  LABORATORY DATA: Lab Results  Component Value Date   WBC 6.1 06/07/2023   HGB 13.2 06/07/2023   HCT 42.0 06/07/2023   MCV 83.7 06/07/2023   PLT 276 06/07/2023      Chemistry      Component Value Date/Time   NA 140 06/07/2023 1444   NA 143 05/14/2022 0832   NA 136 02/12/2013 0606   K 3.9 06/07/2023 1444   K 4.4 02/12/2013 0606  CL 102 06/07/2023 1444   CL 101 02/12/2013 0606   CO2 31 06/07/2023 1444   CO2 30 02/12/2013 0606   BUN 15 06/07/2023 1444   BUN 18 05/14/2022 0832   BUN 25 (H) 02/12/2013 0606   CREATININE 0.63 06/07/2023 1444   CREATININE 0.94 02/12/2013 0606      Component Value Date/Time   CALCIUM 9.5 06/07/2023 1444   CALCIUM 8.8 02/12/2013 0606   ALKPHOS 82 06/07/2023 1444   AST 16 06/07/2023 1444   ALT 16 06/07/2023 1444   BILITOT 0.8 06/07/2023 1444       RADIOGRAPHIC STUDIES:  CT Chest W Contrast  Result Date: 06/14/2023 CLINICAL DATA:  Non-small-cell lung cancer. Restaging. * Tracking Code: BO * EXAM: CT CHEST WITH CONTRAST TECHNIQUE: Multidetector CT imaging of the chest was performed during intravenous contrast administration. RADIATION DOSE REDUCTION: This exam was performed according to the departmental dose-optimization program which includes automated exposure control, adjustment of the mA and/or kV according to patient size and/or use of iterative reconstruction technique. CONTRAST:  75mL OMNIPAQUE IOHEXOL 300 MG/ML  SOLN COMPARISON:  12/06/2022 FINDINGS: Cardiovascular: Heart size upper normal. No substantial pericardial effusion. Coronary artery calcification is evident. Moderate atherosclerotic calcification is noted in the wall of the thoracic  aorta. Mediastinum/Nodes: Left thyroid nodule again noted. This has been evaluated on previous imaging. (ref: J Am Coll Radiol. 2015 Feb;12(2): 143-50).No mediastinal lymphadenopathy. There is no hilar lymphadenopathy. The esophagus has normal imaging features. There is no axillary lymphadenopathy. Lungs/Pleura: Stable post treatment scarring right apex and posterior right upper lobe, incorporating a localization markers at each location. Interval development of dense masslike consolidative opacity with volume loss in the suprahilar left upper lobe, incorporating the 12 mm ground-glass opacity seen on the previous study. Imaging features today suggest scarring from radiation therapy. 8 mm subtle ground-glass opacity in the right lower lobe on image 85/8 is stable. The anterior left upper lobe subtle ground-glass nodule measured previously at 9 mm is 6 mm today on image 51/8. Several additional scattered tiny pulmonary nodules are stable. No new overtly suspicious nodule or mass on the current exam. No pleural effusion. Upper Abdomen: Stable nodularity in the adrenal glands, left greater than right. Musculoskeletal: No worrisome lytic or sclerotic osseous abnormality. IMPRESSION: 1. Interval development of dense masslike consolidative opacity with volume loss in the suprahilar left upper lobe, incorporating the 12 mm ground-glass opacity seen on the previous study. Imaging features today suggest scarring from radiation therapy. Continued attention on follow-up recommended. 2. Stable post treatment scarring in the right apex and posterior right upper lobe. 3. Stable to slight decrease in size of the anterior left upper lobe ground-glass nodule. 4. Stable nodularity in the adrenal glands, left greater than right. 5.  Aortic Atherosclerosis (ICD10-I70.0). Electronically Signed   By: Kennith Center M.D.   On: 06/14/2023 08:34     ASSESSMENT/PLAN:  This is a very pleasant 72 year old Caucasian female with multifocal  adenocarcinoma in the right and left lung.  She has biopsy confirmed right upper lobe adenocarcinoma.  She was diagnosed in January 2023.  Molecular studies by [facility] showed positive EGFR mutation.  Her PD-L1 expression 0%.  The patient completed SBRT to the right upper lobe lung nodule under the care of Dr. Mitzi Hansen which was completed in February 2023.  The patient recently had a restaging CT scan performed.  Dr. Arbutus Ped personally independently reviewed the scan discussed results with the patient today.  The scan showed no evidence of disease  progression.   Dr. Arbutus Ped recommends that the patient continue on observation with a restaging CT scan in 6 months.  If the patient ever has evidence of disease progression in the future, Dr. Arbutus Ped would likely recommend treatment with targeted treatment with Tagrisso which he noted at prior appointments.  The patient was advised to call immediately if she has any concerning symptoms in the interval. The patient voices understanding of current disease status and treatment options and is in agreement with the current care plan. All questions were answered. The patient knows to call the clinic with any problems, questions or concerns. We can certainly see the patient much sooner if necessary        Orders Placed This Encounter  Procedures   CT Chest W Contrast    Standing Status:   Future    Standing Expiration Date:   06/13/2024    Order Specific Question:   If indicated for the ordered procedure, I authorize the administration of contrast media per Radiology protocol    Answer:   Yes    Order Specific Question:   Does the patient have a contrast media/X-ray dye allergy?    Answer:   No    Order Specific Question:   Preferred imaging location?    Answer:   [facility]   CBC with Differential (Cancer Center Only)    Standing Status:   Future    Standing Expiration Date:   06/13/2024   CMP (Cancer Center only)    Standing  Status:   Future    Standing Expiration Date:   06/13/2024     Johnette Abraham Tayte Mcwherter, PA-C 06/14/23  ADDENDUM: Hematology/Oncology Attending: I had a face-to-face encounter with the patient today.  I reviewed her record, lab, scan and recommended her care plan.  This is a very pleasant 72 years old white female with multifocal adenocarcinoma involving the left and right lung with biopsy-proven right upper lobe adenocarcinoma diagnosed in January 2023 with positive uncommon EGFR mutation V769M as well as G719S and negative PD-L1 expression.  The patient is status post SBRT to the right upper lobe lung nodule as well as left upper lobe groundglass opacity.  The first 1 was done in 2023 and the second 1 was completed on Jan 05, 2023 and she is currently on observation.  The patient is feeling fine today with no concerning complaints. She had repeat CT scan of the chest performed recently.  I personally and independently reviewed the scan and discussed the result with the patient today. Her scan showed no concerning findings for disease progression. I recommended for her to continue on observation with repeat CT scan of the chest in 6 months. The patient was advised to call immediately if she has any other concerning symptoms in the interval. The total time spent in the appointment was 20 minutes. Disclaimer: This note was dictated with voice recognition software. Similar sounding words can inadvertently be transcribed and may be missed upon review. Lajuana Matte, MD

## 2023-06-14 ENCOUNTER — Inpatient Hospital Stay: Payer: Medicare HMO | Admitting: Physician Assistant

## 2023-06-14 VITALS — BP 172/64 | HR 79 | Temp 97.9°F | Resp 17 | Ht 64.0 in | Wt 179.0 lb

## 2023-06-14 DIAGNOSIS — C3411 Malignant neoplasm of upper lobe, right bronchus or lung: Secondary | ICD-10-CM

## 2023-08-02 ENCOUNTER — Other Ambulatory Visit: Payer: Medicare HMO

## 2023-08-02 ENCOUNTER — Other Ambulatory Visit: Payer: Self-pay

## 2023-08-02 DIAGNOSIS — E039 Hypothyroidism, unspecified: Secondary | ICD-10-CM

## 2023-08-03 ENCOUNTER — Other Ambulatory Visit: Payer: Self-pay | Admitting: Internal Medicine

## 2023-08-03 LAB — T4, FREE: Free T4: 1.7 ng/dL (ref 0.8–1.8)

## 2023-08-03 LAB — TSH: TSH: 0.35 m[IU]/L — ABNORMAL LOW (ref 0.40–4.50)

## 2023-08-26 ENCOUNTER — Other Ambulatory Visit: Payer: Self-pay | Admitting: Family Medicine

## 2023-08-26 DIAGNOSIS — Z1231 Encounter for screening mammogram for malignant neoplasm of breast: Secondary | ICD-10-CM

## 2023-09-13 ENCOUNTER — Ambulatory Visit
Admission: RE | Admit: 2023-09-13 | Discharge: 2023-09-13 | Disposition: A | Discharge status: Home / Self Care | Payer: Medicare HMO | Source: Ambulatory Visit | Attending: Family Medicine | Admitting: Family Medicine

## 2023-09-13 DIAGNOSIS — Z1231 Encounter for screening mammogram for malignant neoplasm of breast: Secondary | ICD-10-CM | POA: Insufficient documentation

## 2023-12-01 ENCOUNTER — Ambulatory Visit: Payer: Medicare HMO | Admitting: Internal Medicine

## 2023-12-01 ENCOUNTER — Encounter: Payer: Self-pay | Admitting: Internal Medicine

## 2023-12-01 VITALS — BP 110/72 | HR 58 | Ht 64.0 in | Wt 181.0 lb

## 2023-12-01 DIAGNOSIS — E039 Hypothyroidism, unspecified: Secondary | ICD-10-CM | POA: Diagnosis not present

## 2023-12-01 DIAGNOSIS — E042 Nontoxic multinodular goiter: Secondary | ICD-10-CM | POA: Diagnosis not present

## 2023-12-01 DIAGNOSIS — D3500 Benign neoplasm of unspecified adrenal gland: Secondary | ICD-10-CM | POA: Diagnosis not present

## 2023-12-01 DIAGNOSIS — E559 Vitamin D deficiency, unspecified: Secondary | ICD-10-CM

## 2023-12-01 LAB — T4, FREE: Free T4: 1.4 ng/dL (ref 0.8–1.8)

## 2023-12-01 LAB — TSH: TSH: 12.48 m[IU]/L — ABNORMAL HIGH (ref 0.40–4.50)

## 2023-12-01 NOTE — Progress Notes (Unsigned)
 Name: Dorothy Gross  MRN/ DOB: [id number], [birth year]    Age/ Sex: 73 y.o., female     PCP: Jerl Mina, MD   Reason for Endocrinology Evaluation: Adrenal adenoma      Initial Endocrinology Clinic Visit: 05/26/2020    PATIENT IDENTIFIER: [name] is a 73 y.o., female with a past medical history of A. Fib, CHF, HTN and nephrolithiasis. She has followed with [facility] since 05/26/2020  for consultative assistance with management of her adrenal adenoma  HISTORICAL SUMMARY:   Pt had an incidental finding of bilateral adrenal nodularity bilaterally L>R, with a HFU of 40 and 95 post-contrast on CT scan in 12/2019 during painless hematuria workup.   Her Aldo , renin , urinary free cortisol were all normal. Metanephrines were slightly elevated at less then 2x the upper limit of normal, considered not clinically significant in 2021.  Patient was noted with elevated 24-hour urinary cortisol at 57.1 mcg(reference 50) in April 2023 Her dexamethasone suppression test also came back elevated at 2.9 (reference 1.8) Repeat 24-hour urinary cortisol came back normal 04/2022, one of the 2 saliva cortisol samples came back elevated      NO FH of adrenal gland issues, has strong FH of HTN NO FH of renal stones     THYROID HISTORY: She was noted with left thyroid nodule on exam in 04/2020, this was confirmed by a thyroid ultrasound revealing a left inferior 2.8 cm meeting FNA criteria.  She is S/P FNA of the left thyroid nodule on 06/2020 with benign cytology   She was started on levothyroxine with a TSH 8.62 uIU/ml 08/2022, which was discontinued by 07/2023 while she was on amiodarone due to a low TSH of 0.35 uIU/mL on 25 mcg levothyroxine  SUBJECTIVE:    Today (12/01/2023):  Dorothy Gross is here for adrenal adenoma. She is accompanied by her son chris   She continues to follow-up with [facility] cardiology for CHF ,  cardiomyopathy, and A-fib , she continues  to be on amiodarone, Coreg, digoxin, and Eliquis  Patient continues to follow up with oncology for multifocal lung adenocarcinoma  She is S/P radiation treatment to the right upper lobe in 2023  Patient had completed another course of radiation treatment to the left upper lobe lung nodule 12/2022    Held Biotin   Substantial weight gain- no  Severe hypertension- no  Sudden/ severe headaches- occasional due to allergies  Anxiety attacks- no Cardiac arrhythmias- A. 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   Cardiomyopathy (HCC)    CHF (congestive heart failure) (HCC)    Diabetes mellitus without complication (HCC)    History of kidney stones    Hyperlipidemia    Hypertension    Lung cancer (HCC) 09/14/2021   Past Surgical History:  Past Surgical History:  Procedure Laterality Date   ABDOMINAL HYSTERECTOMY     APPENDECTOMY     BREAST EXCISIONAL BIOPSY Right [id number]   negative 1974 negative 2015   BRONCHIAL BIOPSY  03/23/2021   Procedure: BRONCHIAL BIOPSIES;  Surgeon: Josephine Igo, DO;  Location: [facility]  ENDOS[facility];  Service: Pulmonary;;   BRONCHIAL BIOPSY  09/14/2021   Procedure: BRONCHIAL BIOPSIES;  Surgeon: Josephine Igo, DO;  Location: [facility] END[facility];  Service: Pulmonary;;   BRONCHIAL BRUSHINGS  03/23/2021   Procedure: BRONCHIAL BRUSHINGS;  Surgeon: Josephine Igo, DO;  Location: [facility] ENDOS[facility];  Service: Pulmonary;;   BRONCHIAL BRUSHINGS  09/14/2021   Procedure: BRONCHIAL BRUSHINGS;  Surgeon: Josephine Igo, DO;  Location: [facility] ENDOS[facility];  Service: Pulmonary;;   BRONCHIAL NEEDLE ASPIRATION BIOPSY  09/14/2021   Procedure: BRONCHIAL NEEDLE  ASPIRATION BIOPSIES;  Surgeon: Josephine Igo, DO;  Location: [facility];  Service: Pulmonary;;   BRONCHIAL WASHINGS  03/23/2021   Procedure: BRONCHIAL WASHINGS;  Surgeon: Josephine Igo, DO;  Location: [facility];  Service: Pulmonary;;   CHOLECYSTECTOMY N/A 12/05/2015   Procedure: LAPAROSCOPIC CHOLECYSTECTOMY WITH INTRAOPERATIVE CHOLANGIOGRAM;  Surgeon: Nadeen Landau, MD;  Location: [facility] ORS;  Service: General;  Laterality: N/A;   COLONOSCOPY WITH PROPOFOL N/A 10/18/2016   Procedure: COLONOSCOPY WITH PROPOFOL;  Surgeon: Scot Jun, MD;  Location: [facility] ENDOSCOPY;  Service: Endoscopy;  Laterality: N/A;   COLONOSCOPY WITH PROPOFOL N/A 03/17/2018   Procedure: COLONOSCOPY WITH PROPOFOL;  Surgeon: Scot Jun, MD;  Location: [facility] ENDOSCOPY;  Service: Endoscopy;  Laterality: N/A;   CYSTOSCOPY W/ RETROGRADES Bilateral 03/07/2020   Procedure: CYSTOSCOPY WITH RETROGRADE PYELOGRAM;  Surgeon: Sondra Come, MD;  Location: [facility] ORS;  Service: Urology;  Laterality: Bilateral;   CYSTOSCOPY/URETEROSCOPY/HOLMIUM LASER/STENT PLACEMENT Bilateral 03/07/2020   Procedure: CYSTOSCOPY/URETEROSCOPY/HOLMIUM LASER/STENT PLACEMENT;  Surgeon: Sondra Come, MD;  Location: [facility] ORS;  Service: Urology;  Laterality: Bilateral;   FIDUCIAL MARKER PLACEMENT  03/23/2021   Procedure: FIDUCIAL MARKER PLACEMENT;  Surgeon: Josephine Igo, DO;  Location: [facility];  Service: Pulmonary;;   JOINT REPLACEMENT     both knees   REPLACEMENT TOTAL KNEE BILATERAL     TONSILLECTOMY     VIDEO BRONCHOSCOPY WITH ENDOBRONCHIAL NAVIGATION Right 03/23/2021   Procedure: VIDEO BRONCHOSCOPY WITH ENDOBRONCHIAL NAVIGATION;  Surgeon: Josephine Igo, DO;  Location: [facility];  Service: Pulmonary;  Laterality: Right;  ION Case    VIDEO BRONCHOSCOPY WITH RADIAL ENDOBRONCHIAL ULTRASOUND  03/23/2021   Procedure: VIDEO BRONCHOSCOPY WITH RADIAL ENDOBRONCHIAL ULTRASOUND;  Surgeon: Josephine Igo, DO;  Location: [facility];   Service: Pulmonary;;   VIDEO BRONCHOSCOPY WITH RADIAL ENDOBRONCHIAL ULTRASOUND  09/14/2021   Procedure: RADIAL ENDOBRONCHIAL ULTRASOUND;  Surgeon: Josephine Igo, DO;  Location: [facility] ENDOS[facility];  Service: Pulmonary;;   Social History:  reports that she quit smoking about 16 years ago. Her smoking use included cigarettes. She started smoking about 31 years ago. She has a 3.8 pack-year smoking history. She has never used smokeless tobacco. She reports that she does not drink alcohol and does not use drugs. Family History:  Family History  Problem Relation Age of Onset   Diabetes Mother    Diabetes Sister    Diabetes Maternal Aunt    Cancer Maternal Uncle    Cancer Maternal Uncle    Cancer Maternal Uncle    Cancer Maternal Uncle    Cancer Maternal Uncle    Cancer Maternal Uncle    Cancer Maternal Uncle    Cancer Maternal Uncle    Cancer Maternal Uncle    Breast cancer Neg Hx      HOME MEDICATIONS: Allergies as of 12/01/2023   No Known Allergies      Medication List        Accurate as of December 01, 2023  7:54  AM. If you have any questions, ask your nurse or doctor.          acetaminophen 500 MG tablet Commonly known as: TYLENOL Take 1,000 mg by mouth every 6 (six) hours as needed.   amiodarone 100 MG tablet Commonly known as: PACERONE Take 50 mg by mouth daily. What changed: Another medication with the same name was removed. Continue taking this medication, and follow the directions you see here. Changed by: Johnney Ou Marajade Lei   apixaban 5 MG Tabs tablet Commonly known as: ELIQUIS Take 5 mg by mouth 2 (two) times daily.   BIOTIN PO Take 1 capsule by mouth daily.   calcium carbonate 1250 (500 Ca) MG tablet Commonly known as: OS-CAL - dosed in mg of elemental calcium Take 1 tablet by mouth daily with breakfast.   carvedilol 6.25 MG tablet Commonly known as: COREG Take 6.25 mg by mouth 2 (two) times daily with a meal.   cyanocobalamin 1000 MCG tablet Take 1,000  mcg by mouth daily.   digoxin 0.125 MG tablet Commonly known as: LANOXIN Take 0.125 mg by mouth daily.   ferrous sulfate 325 (65 FE) MG tablet Take 325 mg by mouth every other day.   Fifty50 Glucose Meter 2.0 w/Device Kit See admin instructions.   FLAX SEED OIL PO Take 2 capsules by mouth daily.   furosemide 40 MG tablet Commonly known as: LASIX Take 40 mg by mouth daily.   Lancets Misc   losartan 25 MG tablet Commonly known as: COZAAR Take 2 tablets by mouth daily.   metFORMIN 500 MG 24 hr tablet Commonly known as: GLUCOPHAGE-XR Take 1,000 mg by mouth daily with supper.   MULTIVITAMIN ADULT PO Take 1 tablet by mouth daily.   OneTouch Ultra test strip Generic drug: glucose blood USE 1 EACH (1 STRIP TOTAL) 3 (THREE) TIMES DAILY   rosuvastatin 10 MG tablet Commonly known as: CRESTOR Take 10 mg by mouth daily.   sodium chloride 0.65 % Soln nasal spray Commonly known as: OCEAN Place 1 spray into both nostrils daily as needed (Dry nose).   SYSTANE OP Place 1 drop into both eyes daily as needed (dry eyes).   Vitamin D (Ergocalciferol) 1.25 MG (50000 UNIT) Caps capsule Commonly known as: DRISDOL Take 1 capsule (50,000 Units total) by mouth every 7 (seven) days.   Vitamin D3 25 MCG (1000 UT) Caps Take 1,000 Units by mouth daily.   zinc gluconate 50 MG tablet Take 50 mg by mouth daily.          OBJECTIVE:   PHYSICAL EXAM: VS:BP 110/72 (BP Location: Left Arm, Patient Position: Sitting, Cuff Size: Normal)   Pulse (!) 58   Ht 5\' 4"  (1.626 m)   Wt 181 lb (82.1 kg)   SpO2 95%   BMI 31.07 kg/m    EXAM: General: Pt appears well and is in NAD  Thyroid : Left thyroid nodule appreciated   Lungs: Clear with good BS bilat with no rales, rhonchi, or wheezes  Heart: Auscultation: RRR.  Abdomen: Normoactive bowel sounds, soft, nontender, without masses or organomegaly palpable  Extremities:  BL LE: No pretibial edema normal ROM and strength.  Mental Status:  Judgment, insight: Intact Orientation: Oriented to time, place, and person Mood and affect: No depression, anxiety, or agitation     DATA REVIEWED:  11/16/2023 @ [facility]   Glucose 90 Sodium 142 Potassium 4.4 BUN 18 CR 0.7 GFR 93 Calcium 9.2 A1c 6.2%    Thyroid Ultrasound 12/30/2022  Estimated  total number of nodules >/= 1 cm: 2   Number of spongiform nodules >/=  2 cm not described below (TR1): 0   Number of mixed cystic and solid nodules >/= 1.5 cm not described below (TR2): 0   _________________________________________________________   Nodule 1: 0.9 cm solid isoechoic right superior thyroid nodule is unchanged in size and does not meet criteria for FNA or imaging follow-up.   Nodule 2: 0.8 cm solid hypoechoic right mid thyroid nodule does not meet criteria for imaging surveillance or FNA.   Nodule 3: 1.0 cm solid isoechoic right inferior thyroid nodule is unchanged in size since prior examination and does not meet criteria for FNA or imaging follow-up.   Nodule 4: 3.3 x 3.2 x 2.2 cm solid isoechoic left inferior thyroid nodule is unchanged in size. Please correlate with prior FNA results from 07/18/2020.   IMPRESSION: 1. Previously biopsied left inferior thyroid nodule is unchanged in size. Please correlate with prior FNA results from 07/18/2020. 2. Remaining right thyroid nodules do not meet criteria for FNA or imaging surveillance.    FNA of the left  thyroid Nodule 07/18/2020  DIAGNOSIS:  A. THYROID GLAND, LEFT LOBE; ULTRASOUND-GUIDED FINE-NEEDLE ASPIRATION:  - BENIGN (BETHESDA CATEGORY II).   PET scan 11/02/2021 NECK: Left thyroid nodules including a 1.6 cm hypodense nodule and a 1.5 cm hypodense nodule, with metabolic activity similar to the rest of the thyroid gland.   Incidental CT findings: Bilateral common carotid atherosclerotic calcification.   CHEST: Fiducial near the dominant right lower lobe nodule sitting just anterior to the  major fissure. This sub solid nodule measures 1.3 by 1.0 cm on image 64 series 4 has a maximum SUV of 2.1.   The subpleural 1.5 by 0.9 cm nodule at the right lung apex has a maximum SUV of 1.7. The remaining sub solid nodules are too small to characterize.   Low-grade activity along the hazy bilateral nonfocal subpleural reticulation primarily in the lower lobes.   Incidental CT findings: Coronary, aortic arch, and branch vessel atherosclerotic vascular disease. Mild cardiomegaly.   ABDOMEN/PELVIS: Lobulated left adrenal mass 2.4 by 1.5 cm on image 103 of series 4, previously 2.6 by 1.6 cm back on 01/09/2020, with only low-grade metabolic activity, maximum SUV 3.5 (the contralateral morphologically normal appearing right adrenal gland has a maximum SUV of 3.7). This is probably an adenoma based on prior absolute washout of 67%. Current noncontrast density measurements are nonspecific.   Widespread accentuated bowel activity is likely physiologic.   Incidental CT findings: Cholecystectomy. Nonobstructive nephrolithiasis on the right. Residual contrast in the renal collecting systems and urinary bladder. Atherosclerosis is present, including aortoiliac atherosclerotic disease.   SKELETON: No significant abnormal hypermetabolic activity in this region.   Incidental CT findings: Mild levoconvex lumbar scoliosis.   IMPRESSION: 1. The dominant subsolid right lower lobe nodule just anterior to the major fissure has a maximum SUV of 2.1, compatible with low-grade adenocarcinoma based on positive biopsy result. The subpleural 1.5 by 0.9 cm nodule at the right lung apex has a maximum SUV of 1.7. The other sub solid pulmonary nodules are too small to characterize. 2. Low-grade activity along hazy bilateral nonfocal subpleural reticulations in the lower lobes, likely inflammatory. 3. Lobulated left adrenal mass is not hypermetabolic compared to the contralateral morphologically normal  right adrenal gland, and is probably an adenoma based on current and prior imaging. 4. Left thyroid nodules including a 1.6 cm nodule. These were worked up on prior thyroid ultrasound of 06/25/2020  and biopsy of 07/18/2020, which was benign. This has been evaluated on previous imaging. (ref: J Am Coll Radiol. 2015 Feb;12(2): 143-50). 5. Other imaging findings of potential clinical significance: Aortic Atherosclerosis (ICD10-I70.0). Coronary atherosclerosis. Mild cardiomegaly. Nonobstructive nephrolithiasis. Levoconvex lumbar scoliosis.     ASSESSMENT / PLAN / RECOMMENDATIONS:    Adrenal Adenoma:    - B/L adrenal adenomas , stable PET scan 10/2021,  during evaluation for adenocarcinoma of the lung and adrenal adenoma was stable at 2.4 x 1.5 cm -Renin and aldo and  urinary catecholamines have been normal 04/2022, and 2024 -BMP normal     2. Multinodular Goiter:    - Pt is clinically euthyroid  - No local neck symptoms - S/P benign FNA of the left thyroid nodule 06/2020 -Repeat thyroid ultrasound 12/2022 shows stability of the left thyroid nodule, will repeat again this year      3.  Elevated Cortisol :   -Patient was noted with elevated 24-hour urinary cortisol at 57.1 mcg(reference 50) in April 2023 Her dexamethasone suppression test also came back elevated at 2.9 (reference 1.8) -Previous ACTH was low-normal level -Repeat 24-hr urine cortisol was low at 2 ug (04/2022) with one of the salivary cortisol elevated at 0.105 04/2022, ACTH normal at 13 pg/mL  - Repeat 24-hr urine cortisol was normal 18 mcg 09/2022 -I suspect this was false positive given treatment for cancer and receiving radiation therapy close to the initial time of testing -There is no clinical evidence of Cushing syndrome -We will continue to check annually    4. Subclinical Hypothyroidism:  -Patient on amiodarone chronically -She held biotin appropriately -Levothyroxine was discontinued 07/2023 with a low  TSH of 0.35 u IU/mL, patient was on amiodarone   Medication This levothyroxine 50 mcg, half a tablet daily   5.  Vitamin D deficiency:  -Vitamin D normalized  Continue ergocalciferol 50,000 IU weekly  F/U in 6 months     Signed electronically by: Lyndle Herrlich, MD  [facility]  [facility] [address] [place] [place], [place] [address] Phone: [phone] FAX: [phone]      CC: Jerl Mina, MD [address] [place] [address] Phone: [phone]  Fax: [phone]   Return to Endocrinology clinic as below: Future Appointments  Date Time Provider Department Center  12/13/2023  8:30 AM [facility] None  12/13/2023  9:30 AM [facility] 2 [facility] [place]  12/20/2023  3:00 PM Heilingoetter, Cassandra L, PA-C [facility] None

## 2023-12-02 ENCOUNTER — Telehealth: Payer: Self-pay | Admitting: Internal Medicine

## 2023-12-02 DIAGNOSIS — E039 Hypothyroidism, unspecified: Secondary | ICD-10-CM | POA: Insufficient documentation

## 2023-12-02 MED ORDER — LEVOTHYROXINE SODIUM 25 MCG PO TABS: 25.0000 ug | ORAL_TABLET | Freq: Every day | ORAL | 3 refills | Status: DC

## 2023-12-02 NOTE — Telephone Encounter (Signed)
Patient notified and will start medication  

## 2023-12-02 NOTE — Telephone Encounter (Signed)
 Please let the patient know that her thyroid is underactive again, and I would suggest restarting levothyroxine but at a 25 mcg daily dose    I do not know if she still has old prescription of she has a 50 mcg she may take half of that to make up to 25 mcg.   I will send a new prescription of levothyroxine 25 mcg in case needed

## 2023-12-05 ENCOUNTER — Telehealth: Payer: Self-pay | Admitting: Medical Oncology

## 2023-12-05 NOTE — Telephone Encounter (Signed)
 Latina Craver @ insurance  said her Dr Asa Lente order for CT scan is authorized. # [id number] .

## 2023-12-07 ENCOUNTER — Ambulatory Visit
Admission: RE | Admit: 2023-12-07 | Discharge: 2023-12-07 | Disposition: A | Discharge status: Home / Self Care | Source: Ambulatory Visit | Attending: Internal Medicine | Admitting: Internal Medicine

## 2023-12-07 DIAGNOSIS — E042 Nontoxic multinodular goiter: Secondary | ICD-10-CM

## 2023-12-13 ENCOUNTER — Inpatient Hospital Stay: Payer: Medicare HMO | Attending: Physician Assistant

## 2023-12-13 ENCOUNTER — Telehealth: Payer: Self-pay | Admitting: Physician Assistant

## 2023-12-13 ENCOUNTER — Ambulatory Visit (HOSPITAL_COMMUNITY)
Admission: RE | Admit: 2023-12-13 | Discharge: 2023-12-13 | Disposition: A | Discharge status: Home / Self Care | Source: Ambulatory Visit | Attending: Physician Assistant | Admitting: Physician Assistant

## 2023-12-13 DIAGNOSIS — C3411 Malignant neoplasm of upper lobe, right bronchus or lung: Secondary | ICD-10-CM | POA: Insufficient documentation

## 2023-12-13 DIAGNOSIS — Z923 Personal history of irradiation: Secondary | ICD-10-CM | POA: Insufficient documentation

## 2023-12-13 DIAGNOSIS — C3412 Malignant neoplasm of upper lobe, left bronchus or lung: Secondary | ICD-10-CM | POA: Insufficient documentation

## 2023-12-13 LAB — CMP (CANCER CENTER ONLY)
ALT: 13 U/L (ref 0–44)
AST: 13 U/L — ABNORMAL LOW (ref 15–41)
Albumin: 4.2 g/dL (ref 3.5–5.0)
Alkaline Phosphatase: 79 U/L (ref 38–126)
Anion gap: 8 (ref 5–15)
BUN: 16 mg/dL (ref 8–23)
CO2: 28 mmol/L (ref 22–32)
Calcium: 9.3 mg/dL (ref 8.9–10.3)
Chloride: 103 mmol/L (ref 98–111)
Creatinine: 0.75 mg/dL (ref 0.44–1.00)
GFR, Estimated: >60 mL/min (ref >60)
Glucose, Bld: 146 mg/dL — ABNORMAL HIGH (ref 70–99)
Potassium: 4 mmol/L (ref 3.5–5.1)
Sodium: 139 mmol/L (ref 135–145)
Total Bilirubin: 1 mg/dL (ref 0.0–1.2)
Total Protein: 7.7 g/dL (ref 6.5–8.1)

## 2023-12-13 LAB — CBC WITH DIFFERENTIAL (CANCER CENTER ONLY)
Abs Immature Granulocytes: 0.04 10*3/uL (ref 0.00–0.07)
Basophils Absolute: 0.1 10*3/uL (ref 0.0–0.1)
Basophils Relative: 1 %
Eosinophils Absolute: 0 10*3/uL (ref 0.0–0.5)
Eosinophils Relative: 0 %
HCT: 40.8 % (ref 36.0–46.0)
Hemoglobin: 13.1 g/dL (ref 12.0–15.0)
Immature Granulocytes: 0 %
Lymphocytes Relative: 9 %
Lymphs Abs: 1.1 10*3/uL (ref 0.7–4.0)
MCH: 26.6 pg (ref 26.0–34.0)
MCHC: 32.1 g/dL (ref 30.0–36.0)
MCV: 82.9 fL (ref 80.0–100.0)
Monocytes Absolute: 1.2 10*3/uL — ABNORMAL HIGH (ref 0.1–1.0)
Monocytes Relative: 10 %
Neutro Abs: 10 10*3/uL — ABNORMAL HIGH (ref 1.7–7.7)
Neutrophils Relative %: 80 %
Platelet Count: 220 10*3/uL (ref 150–400)
RBC: 4.92 MIL/uL (ref 3.87–5.11)
RDW: 15.8 % — ABNORMAL HIGH (ref 11.5–15.5)
WBC Count: 12.4 10*3/uL — ABNORMAL HIGH (ref 4.0–10.5)
nRBC: 0 % (ref 0.0–0.2)

## 2023-12-13 MED ORDER — IOHEXOL 300 MG/ML  SOLN
75.0000 mL | Freq: Once | INTRAMUSCULAR | Status: AC | PRN
  Administered 2023-12-13: 75 mL via INTRAVENOUS

## 2023-12-13 MED ORDER — SODIUM CHLORIDE (PF) 0.9 % IJ SOLN
INTRAMUSCULAR | Status: AC
  Filled 2023-12-13: qty 50

## 2023-12-13 NOTE — Telephone Encounter (Signed)
 Rescheduled appointment per provider request. The patient is aware of the changes made and is active on MyChart.

## 2023-12-15 ENCOUNTER — Encounter: Payer: Self-pay | Admitting: Internal Medicine

## 2023-12-20 ENCOUNTER — Ambulatory Visit: Payer: Medicare HMO | Admitting: Physician Assistant

## 2023-12-20 ENCOUNTER — Inpatient Hospital Stay (HOSPITAL_BASED_OUTPATIENT_CLINIC_OR_DEPARTMENT_OTHER): Admitting: Internal Medicine

## 2023-12-20 VITALS — BP 149/59 | HR 60 | Temp 97.5°F | Resp 16 | Ht 64.0 in | Wt 180.8 lb

## 2023-12-20 DIAGNOSIS — C3490 Malignant neoplasm of unspecified part of unspecified bronchus or lung: Secondary | ICD-10-CM | POA: Diagnosis not present

## 2023-12-20 DIAGNOSIS — Z923 Personal history of irradiation: Secondary | ICD-10-CM | POA: Diagnosis not present

## 2023-12-20 DIAGNOSIS — C3411 Malignant neoplasm of upper lobe, right bronchus or lung: Secondary | ICD-10-CM | POA: Diagnosis present

## 2023-12-20 DIAGNOSIS — C3412 Malignant neoplasm of upper lobe, left bronchus or lung: Secondary | ICD-10-CM | POA: Diagnosis not present

## 2023-12-20 NOTE — Progress Notes (Signed)
 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  Molecular Study by [employer] [facility]: Positive EGFR V769M, G719S PDL1 Expression: 0%  PRIOR THERAPY: SBRT to the right upper lobe lung nodule and left upper lobe lung nodule under the care of Dr. Jeryl Moris  CURRENT THERAPY: Observation  INTERVAL HISTORY: Dorothy Gross 73 y.o. female returns to the clinic today for 60-month follow-up visit accompanied by her son Larinda Plover.Discussed the use of AI scribe software for clinical note transcription with the patient, who gave verbal consent to proceed.  History of Present Illness   Dorothy Gross is a 73 year old female with multifocal adenocarcinoma of the lung who presents for evaluation and discussion of recent CT scan results.  She was diagnosed with multifocal adenocarcinoma involving the right and left lung in January 2023, characterized by a positive EGFR mutation and negative PD-L1 expression. She is currently under observation following stereotactic body radiation therapy (SBRT) to the enlarging right upper lobe and left upper lobe lung nodules.  She feels tired, attributing this to the radiation treatment, but notes improvement compared to last year. No new chest pain, shortness of breath, cough, hemoptysis, nausea, vomiting, diarrhea, or weight loss since her last visit.  A recent CT scan of the chest was performed, and she is here to discuss the results. She has not yet reviewed the scan results herself.       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  MEDICATIONS:  Current Outpatient Medications  Medication Sig Dispense Refill   acetaminophen  (TYLENOL ) 500 MG tablet Take 1,000 mg by mouth every 6 (six) hours as needed.     amiodarone (PACERONE) 100 MG tablet Take 50 mg by mouth daily.     apixaban (ELIQUIS) 5 MG TABS tablet Take 5 mg by mouth 2 (two) times daily.      BIOTIN PO Take 1 capsule by mouth daily.     Blood Glucose Monitoring Suppl (FIFTY50 GLUCOSE METER 2.0) w/Device KIT See admin instructions.     calcium carbonate (OS-CAL - DOSED IN MG OF ELEMENTAL CALCIUM) 1250 (500 Ca) MG tablet Take 1 tablet by mouth daily with breakfast.     carvedilol (COREG) 6.25 MG tablet Take 6.25 mg by mouth 2 (two) times daily with a meal.      Cholecalciferol (VITAMIN D3) 1000 units CAPS Take 1,000 Units by mouth daily.      cyanocobalamin 1000 MCG tablet Take 1,000 mcg by mouth daily.      digoxin  (LANOXIN ) 0.125 MG tablet Take 0.125 mg by mouth daily.      ferrous sulfate 325 (65 FE) MG tablet Take 325 mg by mouth every other day.     Flaxseed, Linseed, (FLAX SEED OIL PO) Take 2 capsules by mouth  daily.     furosemide (LASIX) 40 MG tablet Take 40 mg by mouth daily.      glucose blood (ONETOUCH ULTRA) test strip USE 1 EACH (1 STRIP TOTAL) 3 (THREE) TIMES DAILY     Lancets MISC      levothyroxine  (SYNTHROID ) 25 MCG tablet Take 1 tablet (25 mcg total) by mouth daily. 90 tablet 3   losartan (COZAAR) 25 MG tablet Take 2 tablets by mouth daily.     metFORMIN (GLUCOPHAGE-XR) 500 MG 24 hr tablet Take 1,000 mg by mouth daily with supper.     Multiple Vitamins-Minerals (MULTIVITAMIN ADULT PO) Take 1 tablet by mouth daily.     Polyethyl Glycol-Propyl Glycol (SYSTANE OP) Place 1 drop into both eyes daily as needed (dry eyes).      rosuvastatin (CRESTOR) 10 MG tablet Take 10 mg by mouth daily.      sodium chloride  (OCEAN) 0.65 % SOLN nasal spray Place 1 spray into both nostrils daily as needed (Dry nose).     Vitamin D , Ergocalciferol , (DRISDOL ) 1.25 MG (50000 UNIT) CAPS capsule Take 1 capsule (50,000 Units total) by mouth every 7 (seven) days. 12 capsule 3   zinc gluconate 50 MG tablet Take 50 mg by mouth daily.      No current facility-administered medications for this visit.    SURGICAL HISTORY:  Past Surgical History:  Procedure Laterality Date   ABDOMINAL HYSTERECTOMY     APPENDECTOMY     BREAST EXCISIONAL BIOPSY Right 5284,1324   negative 1974 negative 2015   BRONCHIAL BIOPSY  03/23/2021   Procedure: BRONCHIAL BIOPSIES;  Surgeon: Prudy Brownie, DO;  Location: [facility];  Service: Pulmonary;;   BRONCHIAL BIOPSY  09/14/2021   Procedure: BRONCHIAL BIOPSIES;  Surgeon: Prudy Brownie, DO;  Location: [facility];  Service: Pulmonary;;   BRONCHIAL BRUSHINGS  03/23/2021   Procedure: BRONCHIAL BRUSHINGS;  Surgeon: Prudy Brownie, DO;  Location: [facility];  Service: Pulmonary;;   BRONCHIAL BRUSHINGS  09/14/2021   Procedure: BRONCHIAL BRUSHINGS;  Surgeon: Prudy Brownie, DO;  Location: [facility];  Service: Pulmonary;;   BRONCHIAL NEEDLE ASPIRATION BIOPSY  09/14/2021   Procedure: BRONCHIAL NEEDLE ASPIRATION BIOPSIES;  Surgeon: Prudy Brownie, DO;  Location: [facility];  Service: Pulmonary;;   BRONCHIAL WASHINGS  03/23/2021   Procedure: BRONCHIAL WASHINGS;  Surgeon: Prudy Brownie, DO;  Location: [facility];  Service: Pulmonary;;   CHOLECYSTECTOMY N/A 12/05/2015   Procedure: LAPAROSCOPIC CHOLECYSTECTOMY WITH INTRAOPERATIVE CHOLANGIOGRAM;  Surgeon: Benancio Bracket, MD;  Location: [facility];  Service: General;  Laterality: N/A;   COLONOSCOPY WITH PROPOFOL  N/A 10/18/2016   Procedure: COLONOSCOPY WITH PROPOFOL ;  Surgeon: Cassie Click, MD;  Location[facility];  Service: Endoscopy;   Laterality: N/A;   COLONOSCOPY WITH PROPOFOL  N/A 03/17/2018   Procedure: COLONOSCOPY WITH PROPOFOL ;  Surgeon: Cassie Click, MD;  Location: [facility] ENDOSCOPY;  Service: Endoscopy;  Laterality: N/A;   CYSTOSCOPY W/ RETROGRADES Bilateral 03/07/2020   Procedure: CYSTOSCOPY WITH RETROGRADE PYELOGRAM;  Surgeon: Lawerence Pressman, MD;  Location: [facility];  Service: Urology;  Laterality: Bilateral;   CYSTOSCOPY/URETEROSCOPY/HOLMIUM LASER/STENT PLACEMENT Bilateral 03/07/2020   Procedure: CYSTOSCOPY/URETEROSCOPY/HOLMIUM LASER/STENT PLACEMENT;  Surgeon: Lawerence Pressman, MD;  Location: [facility];  Service: Urology;  Laterality: Bilateral;   FIDUCIAL MARKER PLACEMENT  03/23/2021   Procedure: FIDUCIAL MARKER PLACEMENT;  Surgeon: Prudy Brownie, DO;  Location: [facility];  Service: Pulmonary;;   JOINT REPLACEMENT     both knees   REPLACEMENT TOTAL KNEE BILATERAL  TONSILLECTOMY     VIDEO BRONCHOSCOPY WITH ENDOBRONCHIAL NAVIGATION Right 03/23/2021   Procedure: VIDEO BRONCHOSCOPY WITH ENDOBRONCHIAL NAVIGATION;  Surgeon: Prudy Brownie, DO;  Location: [facility];  Service: Pulmonary;  Laterality: Right;  ION Case    VIDEO BRONCHOSCOPY WITH RADIAL ENDOBRONCHIAL ULTRASOUND  03/23/2021   Procedure: VIDEO BRONCHOSCOPY WITH RADIAL ENDOBRONCHIAL ULTRASOUND;  Surgeon: Prudy Brownie, DO;  Location: [facility];  Service: Pulmonary;;   VIDEO BRONCHOSCOPY WITH RADIAL ENDOBRONCHIAL ULTRASOUND  09/14/2021   Procedure: RADIAL ENDOBRONCHIAL ULTRASOUND;  Surgeon: Prudy Brownie, DO;  Location: [facility];  Service: Pulmonary;;    REVIEW OF SYSTEMS:  A comprehensive review of systems was negative.   PHYSICAL EXAMINATION: General appearance: alert, cooperative, and no distress Head: Normocephalic, without obvious abnormality, atraumatic Neck: no adenopathy, no JVD, supple, symmetrical, trachea midline, and thyroid  not enlarged, symmetric, no tenderness/mass/nodules Lymph nodes: Cervical, supraclavicular, and  axillary nodes normal. Resp: clear to auscultation bilaterally Back: symmetric, no curvature. ROM normal. No CVA tenderness. Cardio: regular rate and rhythm, S1, S2 normal, no murmur, click, rub or gallop GI: soft, non-tender; bowel sounds normal; no masses,  no organomegaly Extremities: extremities normal, atraumatic, no cyanosis or edema  ECOG PERFORMANCE STATUS: 1 - Symptomatic but completely ambulatory  Blood pressure (!) 149/59, pulse 60, temperature (!) 97.5 F (36.4 C), temperature source Temporal, resp. rate 16, height 5\' 4"  (1.626 m), weight 180 lb 12.8 oz (82 kg), SpO2 96%.  LABORATORY DATA: Lab Results  Component Value Date   WBC 12.4 (H) 12/13/2023   HGB 13.1 12/13/2023   HCT 40.8 12/13/2023   MCV 82.9 12/13/2023   PLT 220 12/13/2023      Chemistry      Component Value Date/Time   NA 139 12/13/2023 0802   NA 143 05/14/2022 0832   NA 136 02/12/2013 0606   K 4.0 12/13/2023 0802   K 4.4 02/12/2013 0606   CL 103 12/13/2023 0802   CL 101 02/12/2013 0606   CO2 28 12/13/2023 0802   CO2 30 02/12/2013 0606   BUN 16 12/13/2023 0802   BUN 18 05/14/2022 0832   BUN 25 (H) 02/12/2013 0606   CREATININE 0.75 12/13/2023 0802   CREATININE 0.94 02/12/2013 0606      Component Value Date/Time   CALCIUM 9.3 12/13/2023 0802   CALCIUM 8.8 02/12/2013 0606   ALKPHOS 79 12/13/2023 0802   AST 13 (L) 12/13/2023 0802   ALT 13 12/13/2023 0802   BILITOT 1.0 12/13/2023 0802       RADIOGRAPHIC STUDIES: US  THYROID  Result Date: 12/14/2023 CLINICAL DATA:  Of thyroid  nodule status post prior FNA. EXAM: THYROID  ULTRASOUND TECHNIQUE: Ultrasound examination of the thyroid  gland and adjacent soft tissues was performed. COMPARISON:  12/30/2022, 12/03/2021, 07/18/2020, 06/25/2020 FINDINGS: Parenchymal Echotexture: Mildly heterogenous Isthmus: 0.2 cm Right lobe: 4.2 x 1.7 x 1.4 cm Left lobe: 4.4 x 2.2 x 2.0 cm _________________________________________________________ Estimated total number of  nodules >/= 1 cm: 2 Number of spongiform nodules >/=  2 cm not described below (TR1): 0 Number of mixed cystic and solid nodules >/= 1.5 cm not described below (TR2): 0 _________________________________________________________ Nodule 1: 0.6 x 0.4 x 0.5 cm solid isoechoic right inferior thyroid  nodule (TI-RADS 3) is unchanged in size since prior examination and still does not meet criteria for FNA or imaging follow-up. Nodule 2: 1.1 x 0.7 x 0.7 cm solid isoechoic right inferior thyroid  nodule (TI-RADS 3) is unchanged in size since prior examination and still does not meet criteria for FNA  or imaging follow-up. Nodule 3: 2.9 x 2.3 x 2.1 cm solid isoechoic left inferior thyroid  nodule (TI-RADS 3) is unchanged in size. Please correlate with prior FNA results from 07/18/2020. IMPRESSION: 1. Previously biopsied left inferior thyroid  nodule is unchanged in size. Please correlate with prior FNA results from 07/18/2020. 2. Remaining right thyroid  nodules are unchanged and do not meet criteria for FNA or imaging surveillance. The above is in keeping with the ACR TI-RADS recommendations - J Am Coll Radiol 2017;14:587-595. Electronically Signed   By: Elester Grim M.D.   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 Molecular Study by [facility] showed Positive EGFR V769M, G719S PDL1 Expression: 0% The patient is status post SBRT to right upper lobe lung nodule under the care of Dr. Jeryl Moris. She underwent SBRT to the enlarging left upper lobe lung nodule. She is currently on observation. She had repeat CT scan of the chest performed recently.  Unfortunately the final report is still pending but I personally independently reviewed the scan images and I do not see significant difference from the previous scan.     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 Status post SBRT to enlarging right upper lobe and left upper lobe lung nodules. Recent CT scan shows no significant changes compared to the previous scan six months ago. Awaiting final radiology report for confirmation. - Await final radiology report for recent CT scan. - Schedule follow-up appointment in six months with CT scan approximately ten days prior to ensure timely results. - If CT scan results differ from review, contact with further recommendations.   She was advised to call immediately if she has any other concerning symptoms in the interval.  The patient voices understanding of current disease status and treatment options and is in agreement with the current care plan.  All questions were answered. The patient knows to call the clinic with any problems, questions or concerns. We can certainly see the patient much sooner if necessary. The total time spent in the appointment was 20 minutes.  Disclaimer: This note was dictated with voice recognition software. Similar sounding words can inadvertently be transcribed and may not be corrected upon review.

## 2024-03-02 ENCOUNTER — Other Ambulatory Visit: Payer: Self-pay | Admitting: Internal Medicine

## 2024-05-24 ENCOUNTER — Other Ambulatory Visit: Payer: Self-pay | Admitting: Internal Medicine

## 2024-06-01 ENCOUNTER — Other Ambulatory Visit

## 2024-06-01 ENCOUNTER — Encounter: Payer: Self-pay | Admitting: Internal Medicine

## 2024-06-01 ENCOUNTER — Ambulatory Visit: Admitting: Internal Medicine

## 2024-06-01 VITALS — BP 134/66 | HR 71 | Ht 64.0 in | Wt 184.2 lb

## 2024-06-01 DIAGNOSIS — E559 Vitamin D deficiency, unspecified: Secondary | ICD-10-CM | POA: Diagnosis not present

## 2024-06-01 DIAGNOSIS — E039 Hypothyroidism, unspecified: Secondary | ICD-10-CM | POA: Diagnosis not present

## 2024-06-01 DIAGNOSIS — D3500 Benign neoplasm of unspecified adrenal gland: Secondary | ICD-10-CM

## 2024-06-01 DIAGNOSIS — E042 Nontoxic multinodular goiter: Secondary | ICD-10-CM | POA: Diagnosis not present

## 2024-06-01 MED ORDER — DEXAMETHASONE 1 MG PO TABS: 1.0000 mg | ORAL_TABLET | Freq: Once | ORAL | 0 refills | Status: AC

## 2024-06-01 NOTE — Progress Notes (Signed)
 Name: Dorothy Gross  MRN/ DOB: [id number], 12/08/1950    Age/ Sex: 73 y.o., female     PCP: Dorothy Lynwood FALCON, MD   Reason for Endocrinology Evaluation: Adrenal adenoma      Initial Endocrinology Clinic Visit: 05/26/2020    PATIENT IDENTIFIER: Dorothy Gross is a 73 y.o., female with a past medical history of A. Fib, CHF, HTN and nephrolithiasis. She has followed with [facility] since 05/26/2020  for consultative assistance with management of her adrenal adenoma  HISTORICAL SUMMARY:   Pt had an incidental finding of bilateral adrenal nodularity bilaterally L>R, with a HFU of 40 and 95 post-contrast on CT scan in 12/2019 during painless hematuria workup.   Her Aldo , renin , urinary free cortisol were all normal. Metanephrines were slightly elevated at less then 2x the upper limit of normal, considered not clinically significant in 2021.  Patient was noted with elevated 24-hour urinary cortisol at 57.1 mcg(reference 50) in April 2023 Her dexamethasone  suppression test also came back elevated at 2.9 (reference 1.8) Repeat 24-hour urinary cortisol came back normal 04/2022, one of the 2 saliva cortisol samples came back elevated      NO FH of adrenal gland issues, has strong FH of HTN NO FH of renal stones     THYROID  HISTORY: She was noted with left thyroid  nodule on exam in 04/2020, this was confirmed by a thyroid  ultrasound revealing a left inferior 2.8 cm meeting FNA criteria.  She is S/P FNA of the left thyroid  nodule on 06/2020 with benign cytology   She was started on levothyroxine  with a TSH 8.62 uIU/ml 08/2022, which was discontinued by 07/2023 while she was on amiodarone due to a low TSH of 0.35 uIU/mL on 25 mcg levothyroxine  , restarted 11/2023 with a TSH 12.48 uIU/mL   SUBJECTIVE:    Today (06/01/2024):  Dorothy Gross is here for adrenal adenoma. She is accompanied by her son Dorothy Gross   She continues to follow-up with [facility] for  CHF ,  cardiomyopathy, and A-fib , she continues to be on amiodarone, Coreg, digoxin , and Eliquis  Patient continues to follow up with oncology for multifocal lung adenocarcinoma  She is S/P radiation treatment to the right upper lobe in 2023. Patient had completed another course of radiation treatment to the left upper lobe lung nodule 12/2022.  CT chest showed stable exam, with no evidence of recurrent or metastatic disease.    No Biotin intake except for MVI Has noted hair thinning  Weight has been stable Severe hypertension- no  Sudden/ severe headaches- Only with sinuses and allergy headaches  Cardiac arrhythmias- A. 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   Cardiomyopathy (HCC)    CHF (congestive heart failure) (HCC)    Diabetes mellitus without complication (HCC)    History of kidney stones    Hyperlipidemia    Hypertension    Lung cancer (HCC) 09/14/2021   Past Surgical History:  Past Surgical History:  Procedure Laterality Date   ABDOMINAL HYSTERECTOMY     APPENDECTOMY     BREAST EXCISIONAL BIOPSY Right 8025,7984   negative 1974  negative 2015   BRONCHIAL BIOPSY  03/23/2021   Procedure: BRONCHIAL BIOPSIES;  Surgeon: Brenna Adine CROME, DO;  Location: [facility];  Service: Pulmonary;;   BRONCHIAL BIOPSY  09/14/2021   Procedure: BRONCHIAL BIOPSIES;  Surgeon: Brenna Adine CROME, DO;  Location: [facility];  Service: Pulmonary;;   BRONCHIAL BRUSHINGS  03/23/2021   Procedure: BRONCHIAL BRUSHINGS;  Surgeon: Brenna Adine CROME, DO;  Location: [facility];  Service: Pulmonary;;   BRONCHIAL BRUSHINGS  09/14/2021   Procedure: BRONCHIAL BRUSHINGS;  Surgeon: Brenna Adine CROME, DO;  Location: [facility];  Service: Pulmonary;;   BRONCHIAL NEEDLE ASPIRATION BIOPSY  09/14/2021   Procedure: BRONCHIAL NEEDLE ASPIRATION BIOPSIES;  Surgeon: Brenna Adine CROME, DO;  Location: [facility];  Service: Pulmonary;;   BRONCHIAL WASHINGS  03/23/2021   Procedure: BRONCHIAL WASHINGS;  Surgeon: Brenna Adine CROME, DO;  Location: [facility];  Service: Pulmonary;;   CHOLECYSTECTOMY N/A 12/05/2015   Procedure: LAPAROSCOPIC CHOLECYSTECTOMY WITH INTRAOPERATIVE CHOLANGIOGRAM;  Surgeon: Larinda Unknown Sharps, MD;  Location: [facility];  Service: General;  Laterality: N/A;   COLONOSCOPY WITH PROPOFOL  N/A 10/18/2016   Procedure: COLONOSCOPY WITH PROPOFOL ;  Surgeon: Lamar ONEIDA Holmes, MD;  Location: [facility];  Service: Endoscopy;  Laterality: N/A;   COLONOSCOPY WITH PROPOFOL  N/A 03/17/2018   Procedure: COLONOSCOPY WITH PROPOFOL ;  Surgeon: Holmes Lamar ONEIDA, MD;  Location: [facility] ENDOSCOPY;  Service: Endoscopy;  Laterality: N/A;   CYSTOSCOPY W/ RETROGRADES Bilateral 03/07/2020   Procedure: CYSTOSCOPY WITH RETROGRADE PYELOGRAM;  Surgeon: Francisca Redell BROCKS, MD;  Location: [facility];  Service: Urology;  Laterality: Bilateral;   CYSTOSCOPY/URETEROSCOPY/HOLMIUM LASER/STENT PLACEMENT Bilateral 03/07/2020   Procedure: CYSTOSCOPY/URETEROSCOPY/HOLMIUM LASER/STENT PLACEMENT;  Surgeon: Francisca Redell BROCKS, MD;  Location: [facility];  Service: Urology;  Laterality: Bilateral;   FIDUCIAL MARKER PLACEMENT  03/23/2021   Procedure: FIDUCIAL MARKER PLACEMENT;  Surgeon: Brenna Adine CROME, DO;  Location: [facility];  Service: Pulmonary;;   JOINT REPLACEMENT     both knees   REPLACEMENT TOTAL KNEE BILATERAL     TONSILLECTOMY     VIDEO BRONCHOSCOPY WITH ENDOBRONCHIAL NAVIGATION Right 03/23/2021   Procedure: VIDEO BRONCHOSCOPY WITH ENDOBRONCHIAL NAVIGATION;  Surgeon: Brenna Adine CROME, DO;  Location: [facility];  Service: Pulmonary;  Laterality: Right;  ION Case    VIDEO BRONCHOSCOPY WITH RADIAL ENDOBRONCHIAL ULTRASOUND  03/23/2021    Procedure: VIDEO BRONCHOSCOPY WITH RADIAL ENDOBRONCHIAL ULTRASOUND;  Surgeon: Brenna Adine CROME, DO;  Location: [facility];  Service: Pulmonary;;   VIDEO BRONCHOSCOPY WITH RADIAL ENDOBRONCHIAL ULTRASOUND  09/14/2021   Procedure: RADIAL ENDOBRONCHIAL ULTRASOUND;  Surgeon: Brenna Adine CROME, DO;  Location: [facility];  Service: Pulmonary;;   Social History:  reports that she quit smoking about 16 years ago. Her smoking use included cigarettes. She started smoking about 31 years ago. She has a 3.8 pack-year smoking history. She has never used smokeless tobacco. She reports that she does not drink alcohol and does not use drugs. Family History:  Family History  Problem Relation Age of Onset   Diabetes Mother    Diabetes Sister    Diabetes Maternal Aunt    Cancer Maternal Uncle    Cancer Maternal Uncle    Cancer Maternal Uncle    Cancer Maternal Uncle    Cancer Maternal Uncle    Cancer Maternal Uncle    Cancer Maternal Uncle    Cancer Maternal Uncle    Cancer Maternal Uncle    Breast cancer Neg Hx      HOME MEDICATIONS: Allergies as of 06/01/2024   No Known Allergies  Medication List        Accurate as of June 01, 2024  7:54 AM. If you have any questions, ask your nurse or doctor.          acetaminophen  500 MG tablet Commonly known as: TYLENOL  Take 1,000 mg by mouth every 6 (six) hours as needed.   amiodarone 100 MG tablet Commonly known as: PACERONE Take 50 mg by mouth daily.   apixaban 5 MG Tabs tablet Commonly known as: ELIQUIS Take 5 mg by mouth 2 (two) times daily.   BIOTIN PO Take 1 capsule by mouth daily.   calcium carbonate 1250 (500 Ca) MG tablet Commonly known as: OS-CAL - dosed in mg of elemental calcium Take 1 tablet by mouth daily with breakfast.   carvedilol 6.25 MG tablet Commonly known as: COREG Take 6.25 mg by mouth 2 (two) times daily with a meal.   cyanocobalamin 1000 MCG tablet Take 1,000 mcg by mouth daily.   digoxin  0.125 MG  tablet Commonly known as: LANOXIN  Take 0.125 mg by mouth daily.   ferrous sulfate 325 (65 FE) MG tablet Take 325 mg by mouth every other day.   Fifty50 Glucose Meter 2.0 w/Device Kit See admin instructions.   FLAX SEED OIL PO Take 2 capsules by mouth daily.   furosemide 40 MG tablet Commonly known as: LASIX Take 40 mg by mouth daily.   Lancets Misc   levothyroxine  25 MCG tablet Commonly known as: SYNTHROID  Take 1 tablet (25 mcg total) by mouth daily.   levothyroxine  50 MCG tablet Commonly known as: SYNTHROID  TAKE 1 TABLET BY MOUTH EVERY DAY   losartan 25 MG tablet Commonly known as: COZAAR Take 2 tablets by mouth daily.   metFORMIN 500 MG 24 hr tablet Commonly known as: GLUCOPHAGE-XR Take 1,000 mg by mouth daily with supper.   MULTIVITAMIN ADULT PO Take 1 tablet by mouth daily.   OneTouch Ultra test strip Generic drug: glucose blood USE 1 EACH (1 STRIP TOTAL) 3 (THREE) TIMES DAILY   rosuvastatin 10 MG tablet Commonly known as: CRESTOR Take 10 mg by mouth daily.   sodium chloride  0.65 % Soln nasal spray Commonly known as: OCEAN Place 1 spray into both nostrils daily as needed (Dry nose).   SYSTANE OP Place 1 drop into both eyes daily as needed (dry eyes).   Vitamin D  (Ergocalciferol ) 1.25 MG (50000 UNIT) Caps capsule Commonly known as: DRISDOL  TAKE 1 CAPSULE (50,000 UNITS TOTAL) BY MOUTH EVERY 7 (SEVEN) DAYS   Vitamin D3 25 MCG (1000 UT) Caps Take 1,000 Units by mouth daily.   zinc gluconate 50 MG tablet Take 50 mg by mouth daily.          OBJECTIVE:   PHYSICAL EXAM: VS:BP 134/66 (BP Location: Left Arm, Patient Position: Sitting, Cuff Size: Large)   Pulse 71   Ht 5' 4 (1.626 m)   Wt 184 lb 3.2 oz (83.6 kg)   SpO2 99%   BMI 31.62 kg/m    EXAM: General: Pt appears well and is in NAD  Thyroid  : Left thyroid  nodule appreciated   Lungs: Clear with good BS bilat  Heart: Auscultation: RRR.  Extremities:  BL LE: No pretibial edema    Mental Status: Judgment, insight: Intact Orientation: Oriented to time, place, and person Mood and affect: No depression, anxiety, or agitation     DATA REVIEWED:   Latest Reference Range & Units 06/01/24 08:37  TSH 0.40 - 4.50 mIU/L 7.25 (H)     11/16/2023 @  [facility]   Glucose 90 Sodium 142 Potassium 4.4 BUN 18 CR 0.7 GFR 93 Calcium 9.2 A1c 6.2%    Thyroid  Ultrasound 12/07/2023  Estimated total number of nodules >/= 1 cm: 2   Number of spongiform nodules >/=  2 cm not described below (TR1): 0   Number of mixed cystic and solid nodules >/= 1.5 cm not described below (TR2): 0   _________________________________________________________   Nodule 1: 0.9 cm solid isoechoic right superior thyroid  nodule is unchanged in size and does not meet criteria for FNA or imaging follow-up.   Nodule 2: 0.8 cm solid hypoechoic right mid thyroid  nodule does not meet criteria for imaging surveillance or FNA.   Nodule 3: 1.0 cm solid isoechoic right inferior thyroid  nodule is unchanged in size since prior examination and does not meet criteria for FNA or imaging follow-up.   Nodule 4: 3.3 x 3.2 x 2.2 cm solid isoechoic left inferior thyroid  nodule is unchanged in size. Please correlate with prior FNA results from 07/18/2020.   IMPRESSION: 1. Previously biopsied left inferior thyroid  nodule is unchanged in size. Please correlate with prior FNA results from 07/18/2020. 2. Remaining right thyroid  nodules do not meet criteria for FNA or imaging surveillance.    FNA of the left  thyroid  Nodule 07/18/2020  DIAGNOSIS:  A. THYROID  GLAND, LEFT LOBE; ULTRASOUND-GUIDED FINE-NEEDLE ASPIRATION:  - BENIGN (BETHESDA CATEGORY II).   PET scan 11/02/2021 NECK: Left thyroid  nodules including a 1.6 cm hypodense nodule and a 1.5 cm hypodense nodule, with metabolic activity similar to the rest of the thyroid  gland.   Incidental CT findings: Bilateral common carotid  atherosclerotic calcification.   CHEST: Fiducial near the dominant right lower lobe nodule sitting just anterior to the major fissure. This sub solid nodule measures 1.3 by 1.0 cm on image 64 series 4 has a maximum SUV of 2.1.   The subpleural 1.5 by 0.9 cm nodule at the right lung apex has a maximum SUV of 1.7. The remaining sub solid nodules are too small to characterize.   Low-grade activity along the hazy bilateral nonfocal subpleural reticulation primarily in the lower lobes.   Incidental CT findings: Coronary, aortic arch, and branch vessel atherosclerotic vascular disease. Mild cardiomegaly.   ABDOMEN/PELVIS: Lobulated left adrenal mass 2.4x 1.5 cm on image 103 of series 4, previously 2.6 x 1.6 cm back on 01/09/2020, with only low-grade metabolic activity, maximum SUV 3.5 (the contralateral morphologically normal appearing right adrenal gland has a maximum SUV of 3.7). This is probably an adenoma based on prior absolute washout of 67%. Current noncontrast density measurements are nonspecific.   Widespread accentuated bowel activity is likely physiologic.   Incidental CT findings: Cholecystectomy. Nonobstructive nephrolithiasis on the right. Residual contrast in the renal collecting systems and urinary bladder. Atherosclerosis is present, including aortoiliac atherosclerotic disease.   SKELETON: No significant abnormal hypermetabolic activity in this region.   Incidental CT findings: Mild levoconvex lumbar scoliosis.   IMPRESSION: 1. The dominant subsolid right lower lobe nodule just anterior to the major fissure has a maximum SUV of 2.1, compatible with low-grade adenocarcinoma based on positive biopsy result. The subpleural 1.5 by 0.9 cm nodule at the right lung apex has a maximum SUV of 1.7. The other sub solid pulmonary nodules are too small to characterize. 2. Low-grade activity along hazy bilateral nonfocal subpleural reticulations in the lower lobes, likely  inflammatory. 3. Lobulated left adrenal mass is not hypermetabolic compared to the contralateral morphologically normal right adrenal gland, and is probably an  adenoma based on current and prior imaging. 4. Left thyroid  nodules including a 1.6 cm nodule. These were worked up on prior thyroid  ultrasound of 06/25/2020 and biopsy of 07/18/2020, which was benign. This has been evaluated on previous imaging. (ref: J Am Coll Radiol. 2015 Feb;12(2): 143-50). 5. Other imaging findings of potential clinical significance: Aortic Atherosclerosis (ICD10-I70.0). Coronary atherosclerosis. Mild cardiomegaly. Nonobstructive nephrolithiasis. Levoconvex lumbar scoliosis.     ASSESSMENT / PLAN / RECOMMENDATIONS:    Adrenal Adenoma:    - B/L adrenal adenomas , stable PET scan 10/2021,  during evaluation for adenocarcinoma of the lung and adrenal adenoma was stable at 2.4 x 1.5 cm -Renin and aldo and  urinary catecholamines have been normal 04/2022, and 2024 -CT imaging of the chest 12/13/2023 showed similar appearance of left adrenal nodularity. -Will proceed with Aldo, renin.     2. Multinodular Goiter:    - Pt is clinically euthyroid  - No local neck symptoms - S/P benign FNA of the left thyroid  nodule 06/2020 - Up-to-date on thyroid  ultrasound     3.  Elevated Cortisol :   -Patient was noted with elevated 24-hour urinary cortisol at 57.1 mcg(reference 50) in April 2023 Her dexamethasone  suppression test also came back elevated at 2.9 (reference 1.8) -Previous ACTH  was low-normal level -Repeat 24-hr urine cortisol was low at 2 ug (04/2022) with one of the salivary cortisol elevated at 0.105 04/2022, ACTH  normal at 13 pg/mL  - Repeat 24-hr urine cortisol was normal 18 mcg 09/2022 -I suspect this was false positive given treatment for cancer and receiving radiation therapy close to the initial time of testing -There is no clinical evidence of Cushing syndrome - Will proceed with dexamethasone   suppression test    4. Subclinical Hypothyroidism:  -Patient on amiodarone chronically -She held biotin appropriately -Levothyroxine  was discontinued 07/2023 with a low TSH of 0.35 u IU/mL, patient  on amiodarone but had to restart in April, 2025 with a TSH of 12u IU/ML - Her TSH has trended down ~7 u IU/ML, but it continues to be above goal, given prior partial TSH suppression with levothyroxine  50 mcg, I will gradually increase levothyroxine     Medication Change evothyroxine 25 mcg, 2 tabs on Sundays and 1 tablet rest of the week   5.  Vitamin D  deficiency:  -Vitamin D  normalized  Continue ergocalciferol  50,000 IU weekly  F/U in 6 months     Signed electronically by: Stefano Redgie Butts, MD  [facility]  [facility] [address] [place], [place] [address] Phone: [phone] FAX: [phone]      CC: Dorothy Lynwood FALCON, MD [address] [place] [address] Phone: [phone]  Fax: [phone]   Return to Endocrinology clinic as below: Future Appointments  Date Time Provider Department Center  06/11/2024  7:30 AM [facility] None  06/11/2024  8:30 AM [facility] 1 [facility] [place]  06/19/2024  8:45 AM Sherrod Sherrod, MD [facility] None

## 2024-06-01 NOTE — Patient Instructions (Signed)
   Instructions for Dexamethasone Suppression Test   Step 1: Choose a morning when you can come to our lab at 8:00 am for a blood draw.   Step 2: On the night before the blood draw, take one 1 mg tablet of dexamethasone at 11:30 pm.  The timing is VERY important!   Step 3: The next morning, go to the lab for blood work at 8:00 am.  Bonita Quin do not have to be on an empty stomach, but the timing is VERY important!

## 2024-06-04 ENCOUNTER — Ambulatory Visit: Payer: Self-pay | Admitting: Internal Medicine

## 2024-06-04 MED ORDER — LEVOTHYROXINE SODIUM 25 MCG PO TABS: 25.0000 ug | ORAL_TABLET | ORAL | 3 refills | Status: AC

## 2024-06-05 NOTE — Telephone Encounter (Signed)
 Discussed results with the pt on 06/05/2024 at 1pm    ACTH  is undetectable, no prior glucose cord intake   Patient was scheduled for dexamethasone  suppression test on 06/14/2024  Emphasized the importance of taking dexamethasone  11:30 PM the prior night  Patient was also instructed to come to the lab fasting with no eating or drinking after midnight unless water   Patient expressed understanding

## 2024-06-11 ENCOUNTER — Inpatient Hospital Stay: Attending: Internal Medicine

## 2024-06-11 ENCOUNTER — Ambulatory Visit (HOSPITAL_COMMUNITY)
Admission: RE | Admit: 2024-06-11 | Discharge: 2024-06-11 | Disposition: A | Discharge status: Home / Self Care | Source: Ambulatory Visit | Attending: Internal Medicine | Admitting: Internal Medicine

## 2024-06-11 DIAGNOSIS — M47814 Spondylosis without myelopathy or radiculopathy, thoracic region: Secondary | ICD-10-CM | POA: Diagnosis not present

## 2024-06-11 DIAGNOSIS — Z923 Personal history of irradiation: Secondary | ICD-10-CM | POA: Diagnosis not present

## 2024-06-11 DIAGNOSIS — C3411 Malignant neoplasm of upper lobe, right bronchus or lung: Secondary | ICD-10-CM | POA: Diagnosis present

## 2024-06-11 DIAGNOSIS — C3490 Malignant neoplasm of unspecified part of unspecified bronchus or lung: Secondary | ICD-10-CM

## 2024-06-11 DIAGNOSIS — I2510 Atherosclerotic heart disease of native coronary artery without angina pectoris: Secondary | ICD-10-CM | POA: Insufficient documentation

## 2024-06-11 DIAGNOSIS — I700 Atherosclerosis of aorta: Secondary | ICD-10-CM | POA: Diagnosis not present

## 2024-06-11 DIAGNOSIS — C3412 Malignant neoplasm of upper lobe, left bronchus or lung: Secondary | ICD-10-CM | POA: Diagnosis not present

## 2024-06-11 DIAGNOSIS — J432 Centrilobular emphysema: Secondary | ICD-10-CM | POA: Insufficient documentation

## 2024-06-11 LAB — CMP (CANCER CENTER ONLY)
ALT: 18 U/L (ref 0–44)
AST: 16 U/L (ref 15–41)
Albumin: 4.3 g/dL (ref 3.5–5.0)
Alkaline Phosphatase: 80 U/L (ref 38–126)
Anion gap: 7 (ref 5–15)
BUN: 21 mg/dL (ref 8–23)
CO2: 32 mmol/L (ref 22–32)
Calcium: 10 mg/dL (ref 8.9–10.3)
Chloride: 102 mmol/L (ref 98–111)
Creatinine: 0.7 mg/dL (ref 0.44–1.00)
GFR, Estimated: >60 mL/min (ref >60)
Glucose, Bld: 127 mg/dL — ABNORMAL HIGH (ref 70–99)
Potassium: 4.4 mmol/L (ref 3.5–5.1)
Sodium: 141 mmol/L (ref 135–145)
Total Bilirubin: 0.6 mg/dL (ref 0.0–1.2)
Total Protein: 7.5 g/dL (ref 6.5–8.1)

## 2024-06-11 LAB — CBC WITH DIFFERENTIAL (CANCER CENTER ONLY)
Abs Immature Granulocytes: 0.03 K/uL (ref 0.00–0.07)
Basophils Absolute: 0.1 K/uL (ref 0.0–0.1)
Basophils Relative: 1 %
Eosinophils Absolute: 0.2 K/uL (ref 0.0–0.5)
Eosinophils Relative: 2 %
HCT: 40.3 % (ref 36.0–46.0)
Hemoglobin: 13.1 g/dL (ref 12.0–15.0)
Immature Granulocytes: 0 %
Lymphocytes Relative: 19 %
Lymphs Abs: 1.7 K/uL (ref 0.7–4.0)
MCH: 27.5 pg (ref 26.0–34.0)
MCHC: 32.5 g/dL (ref 30.0–36.0)
MCV: 84.7 fL (ref 80.0–100.0)
Monocytes Absolute: 0.6 K/uL (ref 0.1–1.0)
Monocytes Relative: 7 %
Neutro Abs: 6.5 K/uL (ref 1.7–7.7)
Neutrophils Relative %: 71 %
Platelet Count: 273 K/uL (ref 150–400)
RBC: 4.76 MIL/uL (ref 3.87–5.11)
RDW: 14.9 % (ref 11.5–15.5)
WBC Count: 9.1 K/uL (ref 4.0–10.5)
nRBC: 0 % (ref 0.0–0.2)

## 2024-06-11 MED ORDER — IOHEXOL 300 MG/ML  SOLN
75.0000 mL | Freq: Once | INTRAMUSCULAR | Status: AC | PRN
  Administered 2024-06-11: 75 mL via INTRAVENOUS

## 2024-06-14 ENCOUNTER — Other Ambulatory Visit

## 2024-06-14 ENCOUNTER — Other Ambulatory Visit: Payer: Self-pay | Admitting: Internal Medicine

## 2024-06-14 DIAGNOSIS — D3500 Benign neoplasm of unspecified adrenal gland: Secondary | ICD-10-CM

## 2024-06-18 ENCOUNTER — Ambulatory Visit: Payer: Self-pay | Admitting: Internal Medicine

## 2024-06-18 LAB — ALDOSTERONE + RENIN ACTIVITY W/ RATIO
ALDO / PRA Ratio: 16 ratio (ref 0.9–28.9)
Aldosterone: 4 ng/dL
Renin Activity: 0.25 ng/mL/h (ref 0.25–5.82)

## 2024-06-18 LAB — VITAMIN D 25 HYDROXY (VIT D DEFICIENCY, FRACTURES): Vit D, 25-Hydroxy: 76 ng/mL (ref 30–100)

## 2024-06-18 LAB — POTASSIUM: Potassium: 4.4 mmol/L (ref 3.5–5.3)

## 2024-06-18 LAB — ACTH: C206 ACTH: 5 pg/mL — ABNORMAL LOW (ref 6–50)

## 2024-06-18 LAB — TSH: TSH: 7.25 m[IU]/L — ABNORMAL HIGH (ref 0.40–4.50)

## 2024-06-18 LAB — CORTISOL: Cortisol, Plasma: 6.4 ug/dL

## 2024-06-19 ENCOUNTER — Inpatient Hospital Stay: Admitting: Internal Medicine

## 2024-06-19 VITALS — BP 150/64 | HR 83 | Temp 97.6°F | Resp 17 | Ht 64.0 in | Wt 183.0 lb

## 2024-06-19 DIAGNOSIS — C3490 Malignant neoplasm of unspecified part of unspecified bronchus or lung: Secondary | ICD-10-CM | POA: Diagnosis not present

## 2024-06-19 DIAGNOSIS — C3411 Malignant neoplasm of upper lobe, right bronchus or lung: Secondary | ICD-10-CM | POA: Diagnosis not present

## 2024-06-19 NOTE — Progress Notes (Signed)
 [facility] Telephone:([phone]) [phone]   [phone]  OFFICE PROGRESS NOTE  Dorothy Lynwood FALCON, MD [address] 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  Molecular Study by [employer] [facility]: Positive EGFR V769M, G719S PDL1 Expression: 0%  PRIOR THERAPY: SBRT to the right upper lobe lung nodule and left upper lobe lung nodule under the care of Dr. Dewey  CURRENT THERAPY: Observation  INTERVAL HISTORY: Dorothy Gross 73 y.o. female returns to the clinic today for 50-month follow-up visit.Discussed the use of AI scribe software for clinical note transcription with the patient, who gave verbal consent to proceed.  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  She has a history of multifocal adenocarcinoma involving both the right and left lungs, with a biopsy-confirmed lesion in the right upper lobe from January 2023. Molecular studies revealed positive EGFR V609M and G719S mutations, with negative PD-L1 expression. She underwent stereotactic body radiation therapy (SBRT) to the right upper lobe and left upper lobe lung nodules and is currently in an observation period.  A recent CT scan of the chest showed stability in most nodules, with a slight increase in size of one nodule by one to two millimeters, now measuring six millimeters. She has multiple small nodules in both lungs, which are being monitored. The largest nodule remains below the detection limit for a PET scan.  She feels tired and not wonderful, which she attributes to low cortisol levels as indicated by her recent visit to a uroendocrinologist. She reports  that her cortisol is low and that she has something on both adrenal glands and her thyroid , based on her recent visit to a uroendocrinologist. Recent lab work was conducted, and she is awaiting further results.  She did not report shortness of breath.     MEDICAL HISTORY: Past Medical History:  Diagnosis Date   A-fib [facility])    Arthritis    CAD (coronary artery disease)    Cancer (HCC)    squamous cell on forehead   Cardiomyopathy (HCC)    CHF (congestive heart failure) (HCC)    Diabetes mellitus without complication (HCC)    History of kidney stones    Hyperlipidemia    Hypertension    Lung cancer (HCC) 09/14/2021    ALLERGIES:  has no known allergies.  MEDICATIONS:  Current Outpatient Medications  Medication Sig Dispense Refill   acetaminophen  (TYLENOL ) 500 MG tablet Take 1,000 mg by mouth every 6 (six) hours as needed.     amiodarone (PACERONE) 100 MG tablet Take 50 mg by mouth daily.     apixaban (ELIQUIS) 5 MG TABS tablet Take 5 mg by mouth 2 (two) times daily.      BIOTIN PO Take 1 capsule by mouth daily.     Blood Glucose Monitoring Suppl (FIFTY50 GLUCOSE METER 2.0) w/Device KIT See admin instructions.     calcium carbonate (OS-CAL - DOSED IN MG OF ELEMENTAL CALCIUM) 1250 (500 Ca) MG tablet Take 1 tablet by mouth daily with breakfast.     carvedilol (COREG) 6.25 MG tablet Take 6.25 mg by mouth 2 (two) times daily with a meal.  Cholecalciferol (VITAMIN D3) 1000 units CAPS Take 1,000 Units by mouth daily.      cyanocobalamin 1000 MCG tablet Take 1,000 mcg by mouth daily.      digoxin  (LANOXIN ) 0.125 MG tablet Take 0.125 mg by mouth daily.      ferrous sulfate 325 (65 FE) MG tablet Take 325 mg by mouth every other day.     Flaxseed, Linseed, (FLAX SEED OIL PO) Take 2 capsules by mouth daily.     furosemide (LASIX) 40 MG tablet Take 40 mg by mouth daily.      glucose blood (ONETOUCH ULTRA) test strip USE 1 EACH (1 STRIP TOTAL) 3 (THREE) TIMES DAILY     Lancets MISC       levothyroxine  (SYNTHROID ) 25 MCG tablet Take 1 tablet (25 mcg total) by mouth as directed. 2 tablets on Sundays and 1 tablet rest of the week 104 tablet 3   losartan (COZAAR) 25 MG tablet Take 2 tablets by mouth daily.     metFORMIN (GLUCOPHAGE-XR) 500 MG 24 hr tablet Take 1,000 mg by mouth daily with supper.     Multiple Vitamins-Minerals (MULTIVITAMIN ADULT PO) Take 1 tablet by mouth daily.     Polyethyl Glycol-Propyl Glycol (SYSTANE OP) Place 1 drop into both eyes daily as needed (dry eyes).     rosuvastatin (CRESTOR) 10 MG tablet Take 10 mg by mouth daily.      sodium chloride  (OCEAN) 0.65 % SOLN nasal spray Place 1 spray into both nostrils daily as needed (Dry nose).     Vitamin D , Ergocalciferol , (DRISDOL ) 1.25 MG (50000 UNIT) CAPS capsule TAKE 1 CAPSULE (50,000 UNITS TOTAL) BY MOUTH EVERY 7 (SEVEN) DAYS 12 capsule 0   zinc gluconate 50 MG tablet Take 50 mg by mouth daily.      No current facility-administered medications for this visit.    SURGICAL HISTORY:  Past Surgical History:  Procedure Laterality Date   ABDOMINAL HYSTERECTOMY     APPENDECTOMY     BREAST EXCISIONAL BIOPSY Right 8025,7984   negative 1974 negative 2015   BRONCHIAL BIOPSY  03/23/2021   Procedure: BRONCHIAL BIOPSIES;  Surgeon: Brenna Adine CROME, DO;  Location: [facility];  Service: Pulmonary;;   BRONCHIAL BIOPSY  09/14/2021   Procedure: BRONCHIAL BIOPSIES;  Surgeon: Brenna Adine CROME, DO;  Location: [facility];  Service: Pulmonary;;   BRONCHIAL BRUSHINGS  03/23/2021   Procedure: BRONCHIAL BRUSHINGS;  Surgeon: Brenna Adine CROME, DO;  Location: [facility];  Service: Pulmonary;;   BRONCHIAL BRUSHINGS  09/14/2021   Procedure: BRONCHIAL BRUSHINGS;  Surgeon: Brenna Adine CROME, DO;  Location: [facility];  Service: Pulmonary;;   BRONCHIAL NEEDLE ASPIRATION BIOPSY  09/14/2021   Procedure: BRONCHIAL NEEDLE ASPIRATION BIOPSIES;  Surgeon: Brenna Adine CROME, DO;  Location: [facility];  Service: Pulmonary;;   BRONCHIAL  WASHINGS  03/23/2021   Procedure: BRONCHIAL WASHINGS;  Surgeon: Brenna Adine CROME, DO;  Location: [facility];  Service: Pulmonary;;   CHOLECYSTECTOMY N/A 12/05/2015   Procedure: LAPAROSCOPIC CHOLECYSTECTOMY WITH INTRAOPERATIVE CHOLANGIOGRAM;  Surgeon: Larinda Unknown Sharps, MD;  Location: [facility];  Service: General;  Laterality: N/A;   COLONOSCOPY WITH PROPOFOL  N/A 10/18/2016   Procedure: COLONOSCOPY WITH PROPOFOL ;  Surgeon: Lamar ONEIDA Holmes, MD;  Location: [facility];  Service: Endoscopy;  Laterality: N/A;   COLONOSCOPY WITH PROPOFOL  N/A 03/17/2018   Procedure: COLONOSCOPY WITH PROPOFOL ;  Surgeon: Holmes Lamar ONEIDA, MD;  Location: [facility] ENDOSCOPY;  Service: Endoscopy;  Laterality: N/A;   CYSTOSCOPY W/ RETROGRADES Bilateral 03/07/2020   Procedure: CYSTOSCOPY  WITH RETROGRADE PYELOGRAM;  Surgeon: Francisca Redell BROCKS, MD;  Location: [facility];  Service: Urology;  Laterality: Bilateral;   CYSTOSCOPY/URETEROSCOPY/HOLMIUM LASER/STENT PLACEMENT Bilateral 03/07/2020   Procedure: CYSTOSCOPY/URETEROSCOPY/HOLMIUM LASER/STENT PLACEMENT;  Surgeon: Francisca Redell BROCKS, MD;  Location: [facility];  Service: Urology;  Laterality: Bilateral;   FIDUCIAL MARKER PLACEMENT  03/23/2021   Procedure: FIDUCIAL MARKER PLACEMENT;  Surgeon: Brenna Adine CROME, DO;  Location: [facility];  Service: Pulmonary;;   JOINT REPLACEMENT     both knees   REPLACEMENT TOTAL KNEE BILATERAL     TONSILLECTOMY     VIDEO BRONCHOSCOPY WITH ENDOBRONCHIAL NAVIGATION Right 03/23/2021   Procedure: VIDEO BRONCHOSCOPY WITH ENDOBRONCHIAL NAVIGATION;  Surgeon: Brenna Adine CROME, DO;  Location: [facility];  Service: Pulmonary;  Laterality: Right;  ION Case    VIDEO BRONCHOSCOPY WITH RADIAL ENDOBRONCHIAL ULTRASOUND  03/23/2021   Procedure: VIDEO BRONCHOSCOPY WITH RADIAL ENDOBRONCHIAL ULTRASOUND;  Surgeon: Brenna Adine CROME, DO;  Location: [facility];  Service: Pulmonary;;   VIDEO BRONCHOSCOPY WITH RADIAL ENDOBRONCHIAL ULTRASOUND  09/14/2021   Procedure: RADIAL  ENDOBRONCHIAL ULTRASOUND;  Surgeon: Brenna Adine CROME, DO;  Location: [facility];  Service: Pulmonary;;    REVIEW OF SYSTEMS:  A comprehensive review of systems was negative except for: Constitutional: positive for fatigue   PHYSICAL EXAMINATION: General appearance: alert, cooperative, fatigued, and no distress Head: Normocephalic, without obvious abnormality, atraumatic Neck: no adenopathy, no JVD, supple, symmetrical, trachea midline, and thyroid  not enlarged, symmetric, no tenderness/mass/nodules Lymph nodes: Cervical, supraclavicular, and axillary nodes normal. Resp: clear to auscultation bilaterally Back: symmetric, no curvature. ROM normal. No CVA tenderness. Cardio: regular rate and rhythm, S1, S2 normal, no murmur, click, rub or gallop GI: soft, non-tender; bowel sounds normal; no masses,  no organomegaly Extremities: extremities normal, atraumatic, no cyanosis or edema  ECOG PERFORMANCE STATUS: 1 - Symptomatic but completely ambulatory  Blood pressure (!) 150/64, pulse 83, temperature 97.6 F (36.4 C), resp. rate 17, height 5' 4 (1.626 m), weight 183 lb (83 kg), SpO2 98%.  LABORATORY DATA: Lab Results  Component Value Date   WBC 9.1 06/11/2024   HGB 13.1 06/11/2024   HCT 40.3 06/11/2024   MCV 84.7 06/11/2024   PLT 273 06/11/2024      Chemistry      Component Value Date/Time   NA 141 06/11/2024 0736   NA 143 05/14/2022 0832   NA 136 02/12/2013 0606   K 4.4 06/11/2024 0736   K 4.4 02/12/2013 0606   CL 102 06/11/2024 0736   CL 101 02/12/2013 0606   CO2 32 06/11/2024 0736   CO2 30 02/12/2013 0606   BUN 21 06/11/2024 0736   BUN 18 05/14/2022 0832   BUN 25 (H) 02/12/2013 0606   CREATININE 0.70 06/11/2024 0736   CREATININE 0.94 02/12/2013 0606      Component Value Date/Time   CALCIUM 10.0 06/11/2024 0736   CALCIUM 8.8 02/12/2013 0606   ALKPHOS 80 06/11/2024 0736   AST 16 06/11/2024 0736   ALT 18 06/11/2024 0736   BILITOT 0.6 06/11/2024 0736        RADIOGRAPHIC STUDIES: CT Chest W Contrast Result Date: 06/11/2024 CLINICAL DATA:  Multifocal adenocarcinoma status post radiation. Restaging. * Tracking Code: BO * EXAM: CT CHEST WITH CONTRAST TECHNIQUE: Multidetector CT imaging of the chest was performed during intravenous contrast administration. RADIATION DOSE REDUCTION: This exam was performed according to the departmental dose-optimization program which includes automated exposure control, adjustment of the mA and/or kV according to patient size and/or use of iterative reconstruction  technique. CONTRAST:  75mL OMNIPAQUE  IOHEXOL  300 MG/ML  SOLN COMPARISON:  CT chest dated 12/13/2023 and multiple priors FINDINGS: Cardiovascular: Normal heart size. No significant pericardial fluid/thickening. Great vessels are normal in course and caliber. No central pulmonary emboli. Coronary artery calcifications and aortic atherosclerosis. Mediastinum/Nodes: Left thyroid  nodule, previously evaluated. No specific follow-up imaging recommended. Normal esophagus. No pathologically enlarged axillary, supraclavicular, mediastinal, or hilar lymph nodes. Lungs/Pleura: The central airways are patent. Mild centrilobular emphysema. Similar bilateral upper lobe postradiation changes. Irregular solid 6 mm nodule located medial to the right apical postradiation change (6:30), gradually increasing in size from 5 x 2 mm on 06/07/2023. Additional bilateral ground-glass nodules are unchanged, for example reference 6 mm peripheral left upper lobe (6:45) and 8 mm anterior right lower lobe (6:74). No pneumothorax. No pleural effusion. Upper abdomen: Cholecystectomy. Unchanged adrenal nodularity, left-greater-than-right. Musculoskeletal: No acute or abnormal lytic or blastic osseous lesions. Multilevel degenerative changes of the thoracic spine. IMPRESSION: 1. Irregular solid 6 mm nodule located medial to the right apical postradiation change, gradually increasing in size from 5 x 2 mm  on 06/07/2023, suspicious for recurrent disease. 2. Additional bilateral ground-glass nodules are unchanged. 3. Stable adrenal nodularity, left-greater-than-right. 4. Aortic Atherosclerosis (ICD10-I70.0) and Emphysema (ICD10-J43.9). Coronary artery calcifications. Assessment for potential risk factor modification, dietary therapy or pharmacologic therapy may be warranted, if clinically indicated. Electronically Signed   By: Limin  Xu M.D.   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 Molecular Study by [facility] showed Positive EGFR V769M, G719S PDL1 Expression: 0% The patient is status post SBRT to right upper lobe lung nodule under the care of Dr. Dewey. She underwent SBRT to the enlarging left upper lobe lung nodule. She is currently on observation. She had repeat CT scan of the chest performed recently.  I personally independently reviewed the scan images and discussed the result with the patient today.  Her scan showed no concerning findings for disease progression except for slight increase in the size of 6 mm nodule located medial to the right apical postradiation changes. 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 Molecular studies showed positive EGFR V609M and G719S, with negative PD-L1 expression. Status post stereotactic body radiation therapy (SBRT) to the right upper lobe and left upper lobe lung nodules. Currently under observation. Recent CT scan shows a tiny nodule with a slight increase of 1-2 mm, but remains below the detection limit for a PET scan.  Other nodules are stable and small. No immediate intervention required unless nodules exceed 10 mm. - Continue surveillance with CT scans every six months. - Will schedule follow-up visit in six months with lab and scan a week prior to the visit. The patient was advised to call immediately if she has any other concerning symptoms in the interval.  The patient voices understanding of current disease status and treatment options and is in agreement with the current care plan.  All questions were answered. The patient knows to call the clinic with any problems, questions or concerns. We can certainly see the patient much sooner if necessary. The total time spent in the appointment was 20 minutes.  Disclaimer: This note was dictated with voice recognition software. Similar sounding words can inadvertently be transcribed and may not be corrected upon review.

## 2024-06-21 LAB — CORTISOL: Cortisol, Plasma: 5 ug/dL

## 2024-06-21 LAB — DEXAMETHASONE, BLOOD: Dexamethasone, Serum: 982 ng/dL

## 2024-06-21 NOTE — Progress Notes (Signed)
 QUICK REFERENCE INFORMATION: CMS.gov Medicare Wellness Visits  Medicare Annual Wellness Visit  Subjective:   Dorothy Gross is a 73 y.o. Female who presents for an Annual Wellness Visit.  Patient Active Problem List  Diagnosis  . CHF (congestive heart failure) (CMS/HHS-HCC)  . Cardiomyopathy (CMS/HHS-HCC)  . Atrial fibrillation (CMS/HHS-HCC)  . Hypercholesterolemia  . Lower back pain  . Obesity  . HTN (hypertension)  . CHF (congestive heart failure) (CMS/HHS-HCC)  . Diabetes mellitus type 2, uncomplicated (CMS/HHS-HCC)  . Malignant neoplasm of upper lobe of left lung (CMS/HHS-HCC)  . Multinodular goiter     Outpatient Medications Prior to Visit  Medication Sig Dispense Refill  . amiodarone (PACERONE) 100 MG tablet TAKE 1 TABLET BY MOUTH EVERY DAY 90 tablet 1  . blood glucose meter kit Use as directed 1 each 0  . calcium carbonate 500 mg calcium (1,250 mg) tablet Take 500 mg of elemental by mouth once daily.    . carvediloL (COREG) 6.25 MG tablet TAKE 1 TABLET BY MOUTH TWICE A DAY 180 tablet 0  . cholecalciferol (VITAMIN D3) 1,000 unit capsule Take 1,000 Units by mouth once daily.    . cyanocobalamin (VITAMIN B12) 1000 MCG tablet Take 1,000 mcg by mouth once daily    . digoxin  (LANOXIN ) 0.125 MG tablet TAKE 1 TABLET (0.125 MG TOTAL) BY MOUTH ONCE DAILY 90 tablet 3  . ELIQUIS 5 mg tablet TAKE 1 TABLET BY MOUTH EVERY 12 HOURS 180 tablet 3  . FUROsemide (LASIX) 40 MG tablet TAKE 1 TABLET BY MOUTH EVERY DAY 90 tablet 3  . lancets (ONETOUCH ULTRASOFT) USE 1 EACH 3 (THREE) TIMES DAILY 100 each 11  . levothyroxine  (SYNTHROID ) 25 MCG tablet Take 25 mcg by mouth once daily    . losartan (COZAAR) 25 MG tablet TAKE 2 TABLETS BY MOUTH EVERY DAY 180 tablet 2  . metFORMIN (GLUCOPHAGE-XR) 500 MG XR tablet Take 2 tablets (1,000 mg total) by mouth daily with dinner 180 tablet 3  . multivitamin with iron-minerals (SUPER THERA VITE M) tablet Take 1 tablet by mouth once daily.    . ONETOUCH ULTRA  TEST test strip 1 EACH (1 STRIP TOTAL) 3 (THREE) TIMES DAILY 300 strip 3  . rosuvastatin (CRESTOR) 10 MG tablet TAKE 1 TABLET BY MOUTH EVERY DAY 90 tablet 3  . zinc 50 mg Tab Take by mouth once daily.     No facility-administered medications prior to visit.    Social History   Socioeconomic History  . Marital status: Divorced  Tobacco Use  . Smoking status: Former    Current packs/day: 0.00    Average packs/day: 0.3 packs/day for 10.0 years (2.5 ttl pk-yrs)    Types: Cigarettes    Start date: 09/03/1997    Quit date: 09/04/2007    Years since quitting: 16.8  . Smokeless tobacco: Never  Vaping Use  . Vaping status: Never Used  Substance and Sexual Activity  . Alcohol use: No    Alcohol/week: 0.0 standard drinks of alcohol  . Drug use: Never  . Sexual activity: Defer   Social Drivers of Health   Financial Resource Strain: Low Risk  (06/21/2024)   Overall Financial Resource Strain (CARDIA)   . Difficulty of Paying Living Expenses: Not hard at all  Food Insecurity: No Food Insecurity (06/21/2024)   Hunger Vital Sign   . Worried About Programme Researcher, Broadcasting/film/video in the Last Year: Never true   . Ran Out of Food in the Last Year: Never true  Transportation Needs:  No Transportation Needs (06/21/2024)   PRAPARE - Transportation   . Lack of Transportation (Medical): No   . Lack of Transportation (Non-Medical): No  Housing Stability: Unknown (06/21/2024)   Housing Stability Vital Sign   . Unable to Pay for Housing in the Last Year: No   . Homeless in the Last Year: No     Family History  Problem Relation Age of Onset  . Coronary Artery Disease (Blocked arteries around heart) Mother   . High blood pressure (Hypertension) Mother   . Hyperlipidemia (Elevated cholesterol) Mother   . Diabetes Mother   . Diabetes type II Mother   . Myocardial Infarction (Heart attack) Father   . Coronary Artery Disease (Blocked arteries around heart) Father   . High blood pressure (Hypertension) Father    . Hyperlipidemia (Elevated cholesterol) Father   . Rheum arthritis Other   . Diabetes Other   . High blood pressure (Hypertension) Other   . Diabetes Sister   . Hyperlipidemia (Elevated cholesterol) Sister   . High blood pressure (Hypertension) Sister   . Stroke Sister 11     Past Medical History:  Diagnosis Date  . Arthritis   . Atrial fibrillation (CMS/HHS-HCC)   . Breast lump   . Cardiomyopathy (CMS/HHS-HCC)   . CHF (congestive heart failure) (CMS/HHS-HCC)   . Chicken pox   . Diabetes mellitus type 2, uncomplicated (CMS/HHS-HCC) 08/2015   TYPE 2  . History of lung biopsy 02/2021  . Hyperlipidemia   . Hypertension   . Leg edema   . Low back pain   . Obesity      Recent Hospitalizations? No  Health Habits: Current exercise activities include: walking Exercise: 3 times/week. Diet: in general, a healthy diet    How many times in the past year has patient used an illegal drug or used a prescription medication for non-medical reasons? 0 (>=1 is positive) Has the patient used opioid medication within the last year? No  Current Medical Providers and Suppliers: [facility] Patient Care Team: [name], MD as PCP - General (Family Medicine) Future Appointments     Date/Time Provider Department Center Visit Type   09/25/2024 8:00 AM Callwood, Cara Endow, MD [facility]      dentist eye doctor oncology  Age-appropriate Screening Schedule: The list below includes current immunization status and future screening recommendations based on patient's age. Orders for these recommended tests are listed in the plan section. The patient has been provided with a written plan. Immunization History  Administered Date(s) Administered  . COVID-19 Pfizer Monovalent Vaccine 10/25/2019, 11/12/2019  . Influenza IIV4, IM PF (65 Yr+) (FLUAD QUAD) 07/06/2019  . PNEUMOCOCCAL (PCV13) (BIRTH-43YR) VACCINE (PREVNAR 13) 04/11/2019  . RZV(>=43YR -OR-19+YRS IF   IMMCOMP) VACCINE [facility]) 04/11/2019, 07/06/2019    Health Maintenance Topics with due status: Overdue     Topic Date Due   Diabetes Education Never done   Hepatitis C Screen Never done   DXA Bone Density Scan Never done   Monofilament Foot Exam Never done   Adult Tetanus (Td And Tdap) Never done   Pneumococcal Vaccine: 50+ 06/06/2019   Mammogram 02/25/2021   Colorectal Cancer Screening 03/17/2021   COVID-19 Vaccine 04/30/2024   Health Maintenance Topics with due status: Due On     Topic Date Due   Influenza Vaccine 04/30/2024   Health Maintenance Topics with due status: Not Due     Topic Last Completion Date   Diabetes Eye Assessment Exam 08/22/2023  Annual Urine Albumin Creatinine Ratio 11/16/2023   Creatinine Level 06/15/2024   Potassium Level 06/15/2024   Lipid Panel 06/15/2024   Hemoglobin A1C 06/15/2024   Serum Calcium 06/15/2024   Medicare Subsequent AWV H9560 06/21/2024   Depression Screening 06/21/2024   Annual Physical/Well Child Check 06/21/2024   RSV Immunization Pregnant or 60+ Not Due   Health Maintenance Topics with due status: Completed     Topic Last Completion Date   Medicare Intial Physical (IPPE) H9597 04/03/2019   Shingrix 07/06/2019   Medicare Initial AWV H9561 04/23/2020   Health Maintenance Topics with due status: Aged Out     Topic Date Due   Hib Vaccines Aged Out   Hepatitis A Vaccines Aged Out   Meningococcal B Vaccine Aged Out   Meningococcal ACWY Vaccine Aged Out   HPV Vaccines Aged Out     Depression Screen-PHQ2/9 completed today  PHQ-2 Over the past 2 weeks, how often have you been bothered by any of the following problems? Little interest or pleasure in doing things: Not at all Feeling down, depressed, or hopeless: Not at all Patient Health Questionnaire-2 Score: 0 PHQ-2 Over the last 2 weeks, how often have you been bothered by any of the following problems? Little interest or pleasure in doing things: Not at  all Feeling down, depressed, or hopeless: Not at all Patient Health Questionnaire-2 Score: 0  PHQ-9 (if PHQ >=3)    PHQ-2 Interpretation Values between 0-3 are considered not significant for depression  PHQ-9 Interpretation and Treatment Recommendations:  0-4= None  5-9= Mild / Treatment: Support, educate to call if worse; return in one month  10-14= Moderate / Treatment: Support, watchful waiting; Antidepressant or Psychotherapy  15-19= Moderately severe / Treatment: Antidepressant OR Psychotherapy  >= 20 = Major depression, severe / Antidepressant AND Psychotherapy  Patient Health Risk Assessment questionnaire ( HRA <redacted file path>) (if patient completed in MyChart or added in flowsheet)    * No data to display          Functional Ability/Safety Screen: 1. Was the patient's timed Get Up and Go Test unsteady or longer than 30 sec? No  How to perform Timed Up and Go test (TUG): [email or web address]  2. Does the patient have difficulty with: Shaune index)  Bathing: No   Dressing: No Toileting: No   Transferring: No  Continence: No  Feeding: No   3. Does the home have: Rugs in the hallway: No Grab bars in the bathroom: Yes Stairs in home: No Handrails on the stairs: not applicable Poor lighting: No  Hearing Evaluation: Does the patient have trouble hearing the television when others do not? No Does the patient strain to hear/understand conversations? No  Advance Care Planning: Patient has executed an Advance Directive: yes  If no, patient was given the opportunity to execute an Advance Directive today? n/a  Are the patient's advanced directives in [place]? no This patient has the ability to prepare an Advance Directive: Yes Provider is willing to follow the patient's wishes: Yes  Cognitive Assessment: Cognitive screen used: no problems Results: The patient does not have any evidence of any cognitive problems and denies any  change in mood/affect, appearance, speech, memory or motor skills.  Identification of Risk Factors: Risk factors include: cardiovascular risk  Review of Systems  Objective:   Vitals:   06/21/24 0832  BP: 138/88  Pulse: 72  Weight: 83 kg (183 lb)  Height: 160 cm (5' 3)   Body mass index is 32.42 kg/m.  Home vitals:     BP 138/88   Pulse 72   Ht 160 cm (5' 3)   Wt 83 kg (183 lb)   BMI 32.42 kg/m   General Appearance:    Alert, cooperative, no distress, appears stated age  Head:    Normocephalic, without obvious abnormality, atraumatic  Eyes:    PERRL, conjunctiva/corneas clear, EOM's intact, fundi    benign, both eyes  Ears:    Normal TM's and external ear canals, both ears  Nose:   Nares normal, septum midline, mucosa normal, no drainage    or sinus tenderness  Throat:   Lips, mucosa, and tongue normal; teeth and gums normal  Neck:   Supple, symmetrical, trachea midline, no adenopathy;    thyroid :  no enlargement/tenderness/nodules; no carotid   bruit or JVD  Back:     Symmetric, no curvature, ROM normal, no CVA tenderness  Lungs:     Clear to auscultation bilaterally, respirations unlabored  Chest Wall:    No tenderness or deformity   Heart:    Regular rate and rhythm, S1 and S2 normal, no murmur, rub   or gallop  Breast Exam:    Deferred  Abdomen:     Soft, non-tender, bowel sounds active all four quadrants,    no masses, no organomegaly  Genitalia:    Deferred  Extremities:   Extremities normal, atraumatic, no cyanosis or edema  Pulses:   2+ and symmetric all extremities  Skin:   Skin color, texture, turgor normal, no rashes or lesions  Lymph nodes:   Cervical, supraclavicular, and axillary nodes normal  Neurologic:   CNII-XII intact, normal strength, sensation and reflexes    throughout      Assessment/Plan:   Patient Self-Management and Personalized Health Advice The patient has been provided with information about: weight management  During the course  of the visit the patient was educated and counseled about appropriate screening and preventive services including:  Influenza vaccine  The patient's BMI is above the acceptable range; discussed or provided materials on diet/exercise  Diagnoses and all orders for this visit:  Medicare annual wellness visit, subsequent  Routine adult health maintenance Encouraged healthy diet, regular exercise, regular health maintenance Depression screening (Z13.31) -     Depression Screen -(PHQ- 2/9, BDI) -     Depression Screen -(PHQ- 2/9, BDI)  Primary hypertension Moderate control, continue current medication, continue to monitor Paroxysmal atrial fibrillation (CMS/HHS-HCC) Asymptomatic, continue to follow with cardiology Type 2 diabetes mellitus without complications, unspecified whether long term insulin use (CMS/HHS-HCC) A1c up to 6.5, continue to monitor diet for sweets and carbohydrates Hypercholesterolemia Good control, continue current medication 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 If yes to either, the patient will need to stop by the front desk or call to schedule.: No   Who is this follow-up with?: PCP   What type of follow up is needed?: Physical   What's the reason for follow up?:  Future Appointments     Date/Time Provider Department Center Visit Type   09/25/2024 8:00 AM Callwood, Cara Endow, MD [facility] FOLLOW UP       An after visit summary was provided for the patient either in  written format or through MyChart  *Some images could not be shown.

## 2024-06-29 NOTE — Telephone Encounter (Signed)
 Discussed dexamethasone  suppression at on 06/29/2024 at 1300 with cortisol of 5    Patient with mild autonomous cortisol secretion.  Patient is asymptomatic at this time, we will continue to monitor, we discussed that if the patient starts to develop cushingoid features, and controlled HTN, rapid progression to diabetes then we will consider surgical intervention   Patient has 2 adrenal gland adenomas, and may need adrenal venous sampling     All questions were answered

## 2024-09-07 ENCOUNTER — Other Ambulatory Visit: Payer: Self-pay | Admitting: Internal Medicine

## 2024-12-03 ENCOUNTER — Other Ambulatory Visit

## 2024-12-06 ENCOUNTER — Ambulatory Visit: Admitting: Internal Medicine

## 2024-12-10 ENCOUNTER — Inpatient Hospital Stay

## 2024-12-18 ENCOUNTER — Inpatient Hospital Stay: Admitting: Internal Medicine
